# Patient Record
Sex: Male | Born: 1950
Health system: Southern US, Community
[De-identification: ages and names within clinical notes are randomized; demographics above are authoritative.]

## PROBLEM LIST (undated history)

## (undated) DIAGNOSIS — K219 Gastro-esophageal reflux disease without esophagitis: Secondary | ICD-10-CM

## (undated) DIAGNOSIS — M1990 Unspecified osteoarthritis, unspecified site: Secondary | ICD-10-CM

## (undated) DIAGNOSIS — Z8719 Personal history of other diseases of the digestive system: Secondary | ICD-10-CM

## (undated) DIAGNOSIS — M180 Bilateral primary osteoarthritis of first carpometacarpal joints: Secondary | ICD-10-CM

## (undated) DIAGNOSIS — G709 Myoneural disorder, unspecified: Secondary | ICD-10-CM

## (undated) DIAGNOSIS — H01009 Unspecified blepharitis unspecified eye, unspecified eyelid: Secondary | ICD-10-CM

## (undated) DIAGNOSIS — I10 Essential (primary) hypertension: Secondary | ICD-10-CM

## (undated) DIAGNOSIS — E042 Nontoxic multinodular goiter: Secondary | ICD-10-CM

## (undated) DIAGNOSIS — T7840XA Allergy, unspecified, initial encounter: Secondary | ICD-10-CM

## (undated) DIAGNOSIS — E119 Type 2 diabetes mellitus without complications: Secondary | ICD-10-CM

## (undated) DIAGNOSIS — J449 Chronic obstructive pulmonary disease, unspecified: Secondary | ICD-10-CM

## (undated) DIAGNOSIS — Z9109 Other allergy status, other than to drugs and biological substances: Secondary | ICD-10-CM

## (undated) DIAGNOSIS — H269 Unspecified cataract: Secondary | ICD-10-CM

## (undated) DIAGNOSIS — R7309 Other abnormal glucose: Secondary | ICD-10-CM

## (undated) DIAGNOSIS — R0902 Hypoxemia: Secondary | ICD-10-CM

## (undated) DIAGNOSIS — E785 Hyperlipidemia, unspecified: Secondary | ICD-10-CM

## (undated) DIAGNOSIS — C801 Malignant (primary) neoplasm, unspecified: Secondary | ICD-10-CM

## (undated) DIAGNOSIS — D2330 Other benign neoplasm of skin of unspecified part of face: Secondary | ICD-10-CM

## (undated) DIAGNOSIS — F419 Anxiety disorder, unspecified: Secondary | ICD-10-CM

## (undated) DIAGNOSIS — N400 Enlarged prostate without lower urinary tract symptoms: Secondary | ICD-10-CM

## (undated) DIAGNOSIS — J392 Other diseases of pharynx: Secondary | ICD-10-CM

## (undated) HISTORY — PX: EYE SURGERY: SHX253

## (undated) HISTORY — DX: Unspecified cataract: H26.9

## (undated) HISTORY — DX: Essential (primary) hypertension: I10

## (undated) HISTORY — DX: Anxiety disorder, unspecified: F41.9

## (undated) HISTORY — DX: Bilateral primary osteoarthritis of first carpometacarpal joints: M18.0

## (undated) HISTORY — PX: CATARACT EXTRACTION: SUR2

## (undated) HISTORY — DX: Hyperlipidemia, unspecified: E78.5

## (undated) HISTORY — DX: Hypoxemia: R09.02

## (undated) HISTORY — DX: Type 2 diabetes mellitus without complications: E11.9

## (undated) HISTORY — DX: Allergy, unspecified, initial encounter: T78.40XA

## (undated) HISTORY — DX: Malignant (primary) neoplasm, unspecified: C80.1

## (undated) HISTORY — DX: Unspecified blepharitis unspecified eye, unspecified eyelid: H01.009

## (undated) HISTORY — DX: Myoneural disorder, unspecified: G70.9

## (undated) HISTORY — PX: BIOPSY THYROID: PRO38

## (undated) HISTORY — DX: Other benign neoplasm of skin of unspecified part of face: D23.30

## (undated) HISTORY — PX: COLONOSCOPY: SHX174

## (undated) HISTORY — PX: NO PAST SURGERIES: SHX2092

## (undated) HISTORY — DX: Chronic obstructive pulmonary disease, unspecified: J44.9

## (undated) HISTORY — DX: Other abnormal glucose: R73.09

## (undated) HISTORY — DX: Benign prostatic hyperplasia without lower urinary tract symptoms: N40.0

---

## 2005-05-15 ENCOUNTER — Ambulatory Visit: Payer: Self-pay | Admitting: Family Medicine

## 2005-05-18 ENCOUNTER — Ambulatory Visit: Payer: Self-pay | Admitting: Internal Medicine

## 2005-06-30 ENCOUNTER — Ambulatory Visit: Payer: Self-pay | Admitting: Family Medicine

## 2005-08-31 ENCOUNTER — Ambulatory Visit: Payer: Self-pay | Admitting: Family Medicine

## 2005-10-11 ENCOUNTER — Ambulatory Visit: Payer: Self-pay | Admitting: Family Medicine

## 2005-11-17 ENCOUNTER — Ambulatory Visit: Payer: Self-pay | Admitting: Family Medicine

## 2006-07-13 ENCOUNTER — Ambulatory Visit: Payer: Self-pay | Admitting: Family Medicine

## 2006-07-13 LAB — CONVERTED CEMR LAB
ALT: 25 units/L (ref 0–40)
AST: 23 units/L (ref 0–37)
Basophils Relative: 0.8 % (ref 0.0–1.0)
Calcium: 9.8 mg/dL (ref 8.4–10.5)
Chloride: 108 meq/L (ref 96–112)
Cholesterol: 194 mg/dL (ref 0–200)
Creatinine, Ser: 0.9 mg/dL (ref 0.4–1.5)
Eosinophil percent: 2.1 % (ref 0.0–5.0)
Glucose, Bld: 113 mg/dL — ABNORMAL HIGH (ref 70–99)
HCT: 41 % (ref 39.0–52.0)
HDL: 32.1 mg/dL — ABNORMAL LOW (ref >39.0)
LDL Cholesterol: 132 mg/dL — ABNORMAL HIGH (ref 0–99)
Lymphocytes Relative: 38.7 % (ref 12.0–46.0)
MCHC: 34.3 g/dL (ref 30.0–36.0)
MCV: 93 fL (ref 78.0–100.0)
Monocytes Absolute: 0.6 10*3/uL (ref 0.2–0.7)
Neutro Abs: 2.4 10*3/uL (ref 1.4–7.7)
TSH: 1.03 microintl units/mL (ref 0.35–5.50)
Triglyceride fasting, serum: 150 mg/dL — ABNORMAL HIGH (ref 0–149)
VLDL: 30 mg/dL (ref 0–40)
WBC: 5 10*3/uL (ref 4.5–10.5)

## 2006-08-13 ENCOUNTER — Ambulatory Visit: Payer: Self-pay | Admitting: Family Medicine

## 2007-01-29 ENCOUNTER — Ambulatory Visit: Payer: Self-pay | Admitting: Family Medicine

## 2007-01-30 ENCOUNTER — Ambulatory Visit: Payer: Self-pay | Admitting: Family Medicine

## 2007-02-01 ENCOUNTER — Ambulatory Visit: Payer: Self-pay | Admitting: Family Medicine

## 2007-02-04 ENCOUNTER — Ambulatory Visit: Payer: Self-pay | Admitting: Family Medicine

## 2007-02-04 LAB — CONVERTED CEMR LAB
Glucose, Bld: 102 mg/dL — ABNORMAL HIGH (ref 70–99)
Hgb A1c MFr Bld: 6.7 % — ABNORMAL HIGH (ref 4.6–6.0)

## 2007-02-06 ENCOUNTER — Encounter: Admission: RE | Admit: 2007-02-06 | Discharge: 2007-03-12 | Payer: Self-pay | Admitting: Family Medicine

## 2007-03-18 ENCOUNTER — Ambulatory Visit: Payer: Self-pay | Admitting: Family Medicine

## 2007-03-18 DIAGNOSIS — M542 Cervicalgia: Secondary | ICD-10-CM | POA: Insufficient documentation

## 2007-03-18 DIAGNOSIS — R7309 Other abnormal glucose: Secondary | ICD-10-CM

## 2007-03-18 DIAGNOSIS — I10 Essential (primary) hypertension: Secondary | ICD-10-CM

## 2007-03-18 HISTORY — DX: Other abnormal glucose: R73.09

## 2007-03-18 HISTORY — DX: Essential (primary) hypertension: I10

## 2007-06-07 ENCOUNTER — Telehealth: Payer: Self-pay | Admitting: Family Medicine

## 2007-08-05 ENCOUNTER — Ambulatory Visit: Payer: Self-pay | Admitting: Family Medicine

## 2007-08-05 LAB — CONVERTED CEMR LAB
AST: 25 units/L (ref 0–37)
Albumin: 4 g/dL (ref 3.5–5.2)
Alkaline Phosphatase: 54 units/L (ref 39–117)
BUN: 14 mg/dL (ref 6–23)
Basophils Absolute: 0 10*3/uL (ref 0.0–0.1)
Basophils Relative: 0.8 % (ref 0.0–1.0)
Bilirubin Urine: NEGATIVE
CO2: 28 meq/L (ref 19–32)
Chloride: 104 meq/L (ref 96–112)
Creatinine, Ser: 0.8 mg/dL (ref 0.4–1.5)
Glucose, Urine, Semiquant: NEGATIVE
HCT: 41.3 % (ref 39.0–52.0)
Hemoglobin: 14.8 g/dL (ref 13.0–17.0)
Ketones, urine, test strip: NEGATIVE
Monocytes Absolute: 0.7 10*3/uL (ref 0.2–0.7)
Monocytes Relative: 12 % — ABNORMAL HIGH (ref 3.0–11.0)
Neutrophils Relative %: 49.9 % (ref 43.0–77.0)
PSA: 0.39 ng/mL (ref 0.10–4.00)
Potassium: 5.2 meq/L — ABNORMAL HIGH (ref 3.5–5.1)
RBC: 4.49 M/uL (ref 4.22–5.81)
RDW: 12 % (ref 11.5–14.6)
Total Bilirubin: 0.8 mg/dL (ref 0.3–1.2)
Total CHOL/HDL Ratio: 8.8
Total Protein: 6.8 g/dL (ref 6.0–8.3)
Triglycerides: 501 mg/dL (ref 0–149)
VLDL: 100 mg/dL — ABNORMAL HIGH (ref 0–40)

## 2007-08-09 ENCOUNTER — Telehealth: Payer: Self-pay | Admitting: Family Medicine

## 2007-08-09 ENCOUNTER — Ambulatory Visit: Payer: Self-pay | Admitting: Family Medicine

## 2007-08-09 DIAGNOSIS — R319 Hematuria, unspecified: Secondary | ICD-10-CM

## 2007-08-12 ENCOUNTER — Telehealth: Payer: Self-pay | Admitting: Family Medicine

## 2007-10-22 ENCOUNTER — Ambulatory Visit: Payer: Self-pay | Admitting: Family Medicine

## 2007-10-22 DIAGNOSIS — E785 Hyperlipidemia, unspecified: Secondary | ICD-10-CM

## 2007-10-22 HISTORY — DX: Hyperlipidemia, unspecified: E78.5

## 2007-10-22 LAB — CONVERTED CEMR LAB
Bilirubin Urine: NEGATIVE
Blood in Urine, dipstick: NEGATIVE
Cholesterol: 220 mg/dL (ref 0–200)
Direct LDL: 134.4 mg/dL
Glucose, Urine, Semiquant: NEGATIVE
HDL: 32.6 mg/dL — ABNORMAL LOW (ref >39.0)
Ketones, urine, test strip: NEGATIVE
Protein, U semiquant: NEGATIVE
Total CHOL/HDL Ratio: 6.7
Triglycerides: 270 mg/dL (ref 0–149)
Urobilinogen, UA: 0.2
VLDL: 54 mg/dL — ABNORMAL HIGH (ref 0–40)

## 2007-10-23 ENCOUNTER — Encounter: Payer: Self-pay | Admitting: Family Medicine

## 2007-10-24 ENCOUNTER — Ambulatory Visit: Payer: Self-pay | Admitting: Family Medicine

## 2007-10-29 ENCOUNTER — Encounter: Payer: Self-pay | Admitting: Family Medicine

## 2007-11-04 ENCOUNTER — Ambulatory Visit: Payer: Self-pay | Admitting: Family Medicine

## 2007-11-04 DIAGNOSIS — J069 Acute upper respiratory infection, unspecified: Secondary | ICD-10-CM | POA: Insufficient documentation

## 2008-02-04 ENCOUNTER — Telehealth: Payer: Self-pay | Admitting: Family Medicine

## 2008-02-14 ENCOUNTER — Ambulatory Visit: Payer: Self-pay | Admitting: Gastroenterology

## 2008-02-18 ENCOUNTER — Telehealth (INDEPENDENT_AMBULATORY_CARE_PROVIDER_SITE_OTHER): Payer: Self-pay | Admitting: *Deleted

## 2008-02-19 ENCOUNTER — Ambulatory Visit: Payer: Self-pay | Admitting: Gastroenterology

## 2008-02-19 ENCOUNTER — Encounter: Payer: Self-pay | Admitting: Gastroenterology

## 2008-02-24 ENCOUNTER — Encounter: Payer: Self-pay | Admitting: Gastroenterology

## 2008-05-07 ENCOUNTER — Telehealth: Payer: Self-pay | Admitting: Family Medicine

## 2008-05-09 ENCOUNTER — Ambulatory Visit: Payer: Self-pay | Admitting: Family Medicine

## 2008-06-26 ENCOUNTER — Telehealth: Payer: Self-pay | Admitting: Family Medicine

## 2008-07-28 ENCOUNTER — Ambulatory Visit: Payer: Self-pay | Admitting: Family Medicine

## 2008-08-03 ENCOUNTER — Telehealth: Payer: Self-pay | Admitting: Family Medicine

## 2008-09-21 ENCOUNTER — Telehealth: Payer: Self-pay | Admitting: Family Medicine

## 2008-11-11 ENCOUNTER — Ambulatory Visit: Payer: Self-pay | Admitting: Family Medicine

## 2008-11-11 LAB — CONVERTED CEMR LAB
Bilirubin Urine: NEGATIVE
Blood in Urine, dipstick: NEGATIVE
CO2: 26 meq/L (ref 19–32)
Chloride: 109 meq/L (ref 96–112)
Glucose, Urine, Semiquant: NEGATIVE
Potassium: 5 meq/L (ref 3.5–5.1)
Protein, U semiquant: NEGATIVE
Sodium: 140 meq/L (ref 135–145)
Urobilinogen, UA: 0.2
pH: 5

## 2009-03-02 ENCOUNTER — Telehealth: Payer: Self-pay | Admitting: Family Medicine

## 2009-05-10 ENCOUNTER — Ambulatory Visit: Payer: Self-pay | Admitting: Family Medicine

## 2009-09-20 ENCOUNTER — Ambulatory Visit: Payer: Self-pay | Admitting: Family Medicine

## 2009-09-20 LAB — CONVERTED CEMR LAB
ALT: 42 units/L (ref 0–53)
BUN: 9 mg/dL (ref 6–23)
Basophils Relative: 0.6 % (ref 0.0–3.0)
Bilirubin Urine: NEGATIVE
Bilirubin, Direct: 0.1 mg/dL (ref 0.0–0.3)
CO2: 28 meq/L (ref 19–32)
Chloride: 111 meq/L (ref 96–112)
Cholesterol: 214 mg/dL — ABNORMAL HIGH (ref 0–200)
Creatinine, Ser: 0.7 mg/dL (ref 0.4–1.5)
Direct LDL: 128.1 mg/dL
Eosinophils Absolute: 0.1 10*3/uL (ref 0.0–0.7)
Eosinophils Relative: 1.7 % (ref 0.0–5.0)
Glucose, Urine, Semiquant: NEGATIVE
HCT: 41.3 % (ref 39.0–52.0)
Ketones, urine, test strip: NEGATIVE
Lymphs Abs: 2 10*3/uL (ref 0.7–4.0)
MCHC: 34.1 g/dL (ref 30.0–36.0)
MCV: 94.8 fL (ref 78.0–100.0)
Monocytes Absolute: 0.6 10*3/uL (ref 0.1–1.0)
Neutrophils Relative %: 53.8 % (ref 43.0–77.0)
PSA: 0.67 ng/mL (ref 0.10–4.00)
Platelets: 244 10*3/uL (ref 150.0–400.0)
Potassium: 4.4 meq/L (ref 3.5–5.1)
Protein, U semiquant: NEGATIVE
TSH: 1.11 microintl units/mL (ref 0.35–5.50)
Total Bilirubin: 0.6 mg/dL (ref 0.3–1.2)
Total Protein: 7.2 g/dL (ref 6.0–8.3)
WBC: 6 10*3/uL (ref 4.5–10.5)
pH: 5

## 2009-09-27 ENCOUNTER — Ambulatory Visit: Payer: Self-pay | Admitting: Family Medicine

## 2009-10-05 ENCOUNTER — Ambulatory Visit: Payer: Self-pay | Admitting: Family Medicine

## 2009-10-07 DIAGNOSIS — D2330 Other benign neoplasm of skin of unspecified part of face: Secondary | ICD-10-CM

## 2009-10-07 HISTORY — DX: Other benign neoplasm of skin of unspecified part of face: D23.30

## 2009-10-29 ENCOUNTER — Ambulatory Visit: Payer: Self-pay | Admitting: Internal Medicine

## 2009-10-29 ENCOUNTER — Telehealth: Payer: Self-pay

## 2009-10-29 DIAGNOSIS — H01009 Unspecified blepharitis unspecified eye, unspecified eyelid: Secondary | ICD-10-CM

## 2009-10-29 HISTORY — DX: Unspecified blepharitis unspecified eye, unspecified eyelid: H01.009

## 2009-11-01 ENCOUNTER — Telehealth: Payer: Self-pay | Admitting: Family Medicine

## 2009-11-18 ENCOUNTER — Ambulatory Visit: Payer: Self-pay | Admitting: Family Medicine

## 2010-01-04 ENCOUNTER — Telehealth: Payer: Self-pay | Admitting: Family Medicine

## 2010-02-14 ENCOUNTER — Telehealth: Payer: Self-pay | Admitting: Family Medicine

## 2010-04-18 ENCOUNTER — Ambulatory Visit: Payer: Self-pay | Admitting: Family Medicine

## 2010-04-18 DIAGNOSIS — E119 Type 2 diabetes mellitus without complications: Secondary | ICD-10-CM

## 2010-04-18 HISTORY — DX: Type 2 diabetes mellitus without complications: E11.9

## 2010-04-18 LAB — CONVERTED CEMR LAB
AST: 28 units/L (ref 0–37)
Albumin: 4 g/dL (ref 3.5–5.2)
BUN: 15 mg/dL (ref 6–23)
Basophils Absolute: 0.1 10*3/uL (ref 0.0–0.1)
CO2: 26 meq/L (ref 19–32)
Chloride: 101 meq/L (ref 96–112)
Creatinine,U: 101.3 mg/dL
Eosinophils Absolute: 0.1 10*3/uL (ref 0.0–0.7)
Glucose, Bld: 235 mg/dL — ABNORMAL HIGH (ref 70–99)
HCT: 41.3 % (ref 39.0–52.0)
Hemoglobin: 14.4 g/dL (ref 13.0–17.0)
Hgb A1c MFr Bld: 13.2 % — ABNORMAL HIGH (ref 4.6–6.5)
Lymphs Abs: 1.6 10*3/uL (ref 0.7–4.0)
MCHC: 35 g/dL (ref 30.0–36.0)
MCV: 92.7 fL (ref 78.0–100.0)
Monocytes Absolute: 0.5 10*3/uL (ref 0.1–1.0)
Monocytes Relative: 9.3 % (ref 3.0–12.0)
Neutro Abs: 2.9 10*3/uL (ref 1.4–7.7)
Platelets: 245 10*3/uL (ref 150.0–400.0)
Potassium: 4.2 meq/L (ref 3.5–5.1)
RDW: 12.7 % (ref 11.5–14.6)
Sodium: 136 meq/L (ref 135–145)
TSH: 0.82 microintl units/mL (ref 0.35–5.50)
Total Bilirubin: 1 mg/dL (ref 0.3–1.2)

## 2010-04-25 ENCOUNTER — Ambulatory Visit: Payer: Self-pay | Admitting: Family Medicine

## 2010-05-09 ENCOUNTER — Ambulatory Visit: Payer: Self-pay | Admitting: Family Medicine

## 2010-06-09 ENCOUNTER — Ambulatory Visit: Payer: Self-pay | Admitting: Family Medicine

## 2010-09-04 LAB — CONVERTED CEMR LAB
ALT: 35 units/L (ref 0–53)
AST: 23 units/L (ref 0–37)
Alkaline Phosphatase: 60 units/L (ref 39–117)
BUN: 12 mg/dL (ref 6–23)
Basophils Relative: 0.6 % (ref 0.0–3.0)
CO2: 28 meq/L (ref 19–32)
Chloride: 107 meq/L (ref 96–112)
Eosinophils Absolute: 0.3 10*3/uL (ref 0.0–0.7)
Eosinophils Relative: 3.9 % (ref 0.0–5.0)
GFR calc non Af Amer: 92 mL/min
HDL: 35 mg/dL — ABNORMAL LOW (ref >39.0)
LDL Cholesterol: 113 mg/dL — ABNORMAL HIGH (ref 0–99)
Lymphocytes Relative: 18 % (ref 12.0–46.0)
MCV: 92.7 fL (ref 78.0–100.0)
Neutrophils Relative %: 66.2 % (ref 43.0–77.0)
Platelets: 287 10*3/uL (ref 150–400)
Potassium: 4.4 meq/L (ref 3.5–5.1)
RBC: 4.79 M/uL (ref 4.22–5.81)
Total Bilirubin: 0.7 mg/dL (ref 0.3–1.2)
Total CHOL/HDL Ratio: 5.3
VLDL: 38 mg/dL (ref 0–40)
WBC: 8.3 10*3/uL (ref 4.5–10.5)

## 2010-09-08 NOTE — Assessment & Plan Note (Signed)
Summary: 2 wk rov/njr   Vital Signs:  Patient profile:   60 year old male Weight:      238 pounds Temp:     98.1 degrees F oral BP sitting:   130 / 78  (left arm) Cuff size:   regular  Vitals Entered By: Kern Reap CMA Duncan Dull) (May 09, 2010 11:32 AM) CC: follow-up visit Is Patient Diabetic? Yes Did you bring your meter with you today? No Pain Assessment Patient in pain? no        CC:  follow-up visit.  History of Present Illness: Robert Cordova is a 60 year old male, type II diabetic, who comes in today for follow-up of diabetes.  Two weeks ago we increased his metformin to 1000 mg b.i.d. now.  Blood sugars are down to 100.  No hypoglycemia  Diabetes Management History:      He says that he is exercising.    Allergies: 1)  ! Ace Inhibitors  Past History:  Past medical, surgical, family and social histories (including risk factors) reviewed for relevance to current acute and chronic problems.  Past Medical History: Reviewed history from 07/28/2008 and no changes required. Hypertension fractured right fibula, 2009  Past Surgical History: Reviewed history from 07/28/2008 and no changes required. Denies surgical history  Family History: Reviewed history from 04/18/2010 and no changes required. Family History High cholesterol Family History Hypertension Fam hx MI Family History Diabetes 1st degree relative  Social History: Reviewed history from 08/09/2007 and no changes required. 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  Take 1 tablet by mouth two times a day His updated medication list for this problem includes:    Bayer Aspirin 325 Mg Tabs (Aspirin) ..... Once daily    Cozaar 100 Mg Tabs (Losartan potassium) .[name]... 1 & 1/2 qam    Metformin Hcl 1000 Mg Tabs (Metformin hcl) .[name]... Take 1 tablet by mouth two times a day  Orders: Prescription Created Electronically [phone])  Complete Medication List: 1)  Bayer Aspirin 325 Mg Tabs (Aspirin) .... Once daily 2)  Rogaine Extra Strength For Men 5 % Soln (Minoxidil) .... Once daily 3)  Cozaar 100 Mg Tabs (Losartan potassium) .[name].. 1 & 1/2 qam 4)  Bacitra-neomycin-polymyxin-hc 1 % Oint (Bacitracin-polymyx-neo-hc) .... Apply 4 times daily 5)  Zegerid Otc 20-1100 Mg Caps (Omeprazole-sodium bicarbonate) 6)  Metformin Hcl 1000 Mg Tabs (Metformin hcl) .... Take 1 tablet by mouth two times a day  Patient Instructions: 1)  See your eye doctor yearly to check for diabetic eye damage 2)  continue the metformin 1000 mg twice daily.  Check a fasting blood sugar daily.  Return in 4 weeks for follow-up Prescriptions: METFORMIN HCL 1000 MG TABS (METFORMIN HCL) Take 1 tablet by mouth two times a day  #200 x 3   Entered and Authorized by:   Roderick Pee MD   Signed by:   Roderick Pee MD on 05/09/2010   Method used:   [facility]        [facility] [address] [phone]* (retail)       [address]  [place], [place]  [address]       Ph: [phone] or [phone]       Fax: [phone]   RxID:   [id number]

## 2010-09-08 NOTE — Assessment & Plan Note (Signed)
Summary: 4 week follow up/cjr/pt rsc/cjr   Vital Signs:  Patient profile:   60 year old male Weight:      234 pounds Temp:     98.4 degrees F oral BP sitting:   130 / 80  (left arm) Cuff size:   regular  Vitals Entered By: Kern Reap CMA Duncan Dull) (June 09, 2010 11:43 AM) CC: follow-up visit   CC:  follow-up visit.  History of Present Illness: Robert Cordova is a 60 year old, married male, nonsmoker, who comes in today for follow-up of diabetes.  On metformin 1000 mg b.i.d. his blood sugars dropped to normal 90 to 110.  No hypoglycemia.  BP on Cozaar 150 daily, normal 130/80  Allergies: 1)  ! Ace Inhibitors  Past History:  Past medical, surgical, family and social histories (including risk factors) reviewed for relevance to current acute and chronic problems.  Past Medical History: Reviewed history from 07/28/2008 and no changes required. Hypertension fractured right fibula, 2009  Past Surgical History: Reviewed history from 07/28/2008 and no changes required. Denies surgical history  Family History: Reviewed history from 04/18/2010 and no changes required. Family History High cholesterol Family History Hypertension Fam hx MI Family History Diabetes 1st degree relative  Social History: Reviewed history from 08/09/2007 and no changes required. 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 Once daily    Cozaar 100 Mg Tabs (Losartan potassium) .[name]... 1 & 1/2 qam    Metformin Hcl 1000 Mg Tabs (Metformin hcl) .[name]... Take 1 tablet by mouth two times a  day  Complete Medication List: 1)  Bayer Aspirin 325 Mg Tabs (Aspirin) .... Once daily 2)  Rogaine Extra Strength For Men 5 % Soln (Minoxidil) .... Once daily 3)  Cozaar 100 Mg Tabs (Losartan potassium) .[name].. 1 & 1/2 qam 4)  Bacitra-neomycin-polymyxin-hc 1 % Oint (Bacitracin-polymyx-neo-hc) .... Apply 4 times daily 5)  Zegerid Otc 20-1100 Mg Caps (Omeprazole-sodium bicarbonate) 6)  Metformin Hcl 1000 Mg Tabs (Metformin hcl) .... Take 1 tablet by mouth two times a day  Patient Instructions: 1)  continue current medications 2)  Remember the importance of walking 30 minutes daily 3)  Please schedule a follow-up appointment in 3 months.///250.00 4)  BMP prior to visit, ICD-9: 5)  HbgA1C prior to visit, ICD-9:   Orders Added: 1)  Est. Patient Level III [42595]

## 2010-09-08 NOTE — Assessment & Plan Note (Signed)
Summary: cpx//ccm   Vital Signs:  Patient profile:   60 year old male Height:      70.25 inches Weight:      248 pounds BMI:     35.46 Temp:     99.0 degrees F oral BP sitting:   130 / 90  (left arm) Cuff size:   regular  Vitals Entered By: Kern Reap CMA Duncan Dull) (September 27, 2009 10:40 AM)  Reason for Visit cpx  History of Present Illness: Robert Cordova is a 60 year old, married male, nonsmoker, who comes in today for evaluation of hypertension.  He is on 60 mg of Benicar daily.  BP at home 140 to 130 systolic, 80 diastolic.  Review of systems negative.  He gets routine eye care, dental care, colonoscopy, 2010, normal, tetanus, 2008, seasonal flu 2010  Allergies: 1)  ! Ace Inhibitors  Past History:  Past medical, surgical, family and social histories (including risk factors) reviewed, and no changes noted (except as noted below).  Past Medical History: Reviewed history from 07/28/2008 and no changes required. Hypertension fractured right fibula, 2009  Past Surgical History: Reviewed history from 07/28/2008 and no changes required. Denies surgical history  Family History: Reviewed history from 03/18/2007 and no changes required. Family History High cholesterol Family History Hypertension Fam hx MI  Social History: Reviewed history from 08/09/2007 and no changes required. 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 No apparent alopecia or balding. Eyes:  No corneal or conjunctival inflammation noted. EOMI. Perrla. Funduscopic exam benign, without hemorrhages, exudates or papilledema. Vision grossly normal. Ears:  External ear exam shows no significant lesions or deformities.   Otoscopic examination reveals clear canals, tympanic membranes are intact bilaterally without bulging, retraction, inflammation or discharge. Hearing is grossly normal bilaterally. Nose:  External nasal examination shows no deformity or inflammation. Nasal mucosa are pink and moist without lesions or exudates. Mouth:  Oral mucosa and oropharynx without lesions or exudates.  Teeth in good repair. Neck:  No deformities, masses, or tenderness noted. Chest Wall:  No deformities, masses, tenderness or gynecomastia noted. Breasts:  No masses or gynecomastia noted Lungs:  Normal respiratory effort, chest expands symmetrically. Lungs are clear to auscultation, no crackles or wheezes. Heart:  Normal rate and regular rhythm. S1 and S2 normal without gallop, murmur, click, rub or other extra sounds. Abdomen:  Bowel sounds positive,abdomen soft and non-tender without masses, organomegaly or hernias noted. Rectal:  No external abnormalities noted. Normal sphincter tone. No rectal masses or tenderness. Genitalia:  Testes bilaterally descended without nodularity, tenderness or masses. No scrotal masses or lesions. No penis lesions or urethral discharge. Prostate:  Prostate gland firm and smooth, no enlargement, nodularity, tenderness, mass, asymmetry or induration. Msk:  No deformity or scoliosis noted of thoracic or lumbar spine.   Pulses:  R and L carotid,radial,femoral,dorsalis pedis and posterior tibial pulses are full and equal bilaterally Extremities:  No clubbing, cyanosis, edema, or deformity noted with normal full range of motion of all joints.   Neurologic:  No cranial nerve deficits noted. Station and gait are normal. Plantar reflexes are down-going bilaterally. DTRs are symmetrical throughout. Sensory, motor and coordinative functions appear intact. Skin:  Intact without suspicious lesions or rashes Cervical Nodes:  No lymphadenopathy noted Axillary Nodes:  No palpable lymphadenopathy Inguinal  Nodes:   No significant adenopathy Psych:  Cognition and judgment appear intact. Alert and cooperative with normal attention span and concentration. No apparent delusions, illusions, hallucinations   Impression & Recommendations:  Problem # 1:  HYPERTENSION (ICD-401.9) Assessment Improved  The following medications were removed from the medication list:    Benicar 40 Mg Tabs (Olmesartan medoxomil) .[name]... Take 1 tablet by mouth once a day His updated medication list for this problem includes:    Cozaar 100 Mg Tabs (Losartan potassium) .[name]... Take 1 tablet by mouth every morning  Orders: Prescription Created Electronically [phone]) EKG w/ Interpretation (93000)  Problem # 2:  PHYSICAL EXAMINATION (ICD-V70.0) Assessment: Unchanged  Orders: Prescription Created Electronically [phone])  Complete Medication List: 1)  Bayer Aspirin 325 Mg Tabs (Aspirin) .... Once daily 2)  Rogaine Extra Strength For Men 5 % Soln (Minoxidil) .... Once daily 3)  Cozaar 100 Mg Tabs (Losartan potassium) .... Take 1 tablet by mouth every morning  Patient Instructions: 1)  Please schedule a follow-up appointment in 1 year. 2)  It is important that you exercise regularly at least 20 minutes 5 times a week. If you develop chest pain, have severe difficulty breathing, or feel very tired , stop exercising immediately and seek medical attention. 3)  Take an Aspirin every day. 4)  change y  blood pressure medication..... Cozaar 100 mg daily check y BP daily for 4 weeks to be sure your blood pressure is normal.......... systolic less than 135......... diastolic less than 85 Prescriptions: COZAAR 100 MG TABS (LOSARTAN POTASSIUM) Take 1 tablet by mouth every morning  #100 x 3   Entered and Authorized by:   Roderick Pee MD   Signed by:   Roderick Pee MD on 09/27/2009   Method used:   Electronically to        [facility] [address] [phone]* (retail)       [address]       [place], [place]  [address]        Ph: [phone] or [phone]       Fax: [phone]   RxID:   [id number]    Immunization History:  Tetanus/Td Immunization History:    Tetanus/Td:  historical (08/07/2006)

## 2010-09-08 NOTE — Progress Notes (Signed)
Summary: Elevated BP  Phone Note Call from Patient Call back at Home Phone [phone]   Reason for Call: Talk to Nurse Summary of Call: Patient changed blood pressure meds from Benecar to Losartan (60mg ) approx. 3 weeks ago.  Blood pressure seems to be elevating again.  Currently 150/80... Wants to know if he should increase dosage. Initial call taken by: Everrett Coombe,  November 01, 2009 2:15 PM  Follow-up for Phone Call        increase Cozaar to 100 mg daily, dispense 100 tablets, refills x 2.  Also BP check q. a.m. x 4 weeks to be sure.  Blood pressure is normal.  If not see me Follow-up by: Roderick Pee MD,  November 01, 2009 2:47 PM  Additional Follow-up for Phone Call Additional follow up Details #1::        patient is already taking cozaar 100? Additional Follow-up by: Kern Reap CMA Duncan Dull),  November 01, 2009 5:12 PM    Additional Follow-up for Phone Call Additional follow up Details #2::    take one and half tab once daily  Follow-up by: Kern Reap CMA Duncan Dull),  November 01, 2009 5:19 PM  Additional Follow-up for Phone Call Additional follow up Details #3:: Details for Additional Follow-up Action Taken: patient is aware appointment made Additional Follow-up by: Kern Reap CMA Duncan Dull),  November 01, 2009 5:22 PM

## 2010-09-08 NOTE — Assessment & Plan Note (Signed)
Summary: LESION REMOVAL/NJR   Vital Signs:  Patient profile:   60 year old male BP sitting:   146 / 84  (left arm) Cuff size:   regular  Vitals Entered By: Raechel Ache, RN (October 05, 2009 11:44 AM) A  Procedure Note Last Tetanus: Historical (08/07/2006)  Mole Biopsy/Removal: Indication: suspicious lesion  Procedure # 1: elliptical incision with 3 mm margin    Size (in cm): 0.8 x 0.8    Region: anterior    Location: maxillary-right    Instrument used: #15 blade    Anesthesia: 1% lidocaine w/epinephrine    Closure: caut  Cleaned and prepped with: alcohol Wound dressing: vaseline and bandaid  CC: Lesion removal from face.   CC:  Lesion removal from face.[name]  History of Present Illness: Robert Cordova is a 27 -year-old male, who comes in today for removal of an ulcerated lesion on his right face.    Allergies: 1)  ! Ace Inhibitors   Complete Medication List: 1)  Bayer Aspirin 325 Mg Tabs (Aspirin) .... Once daily 2)  Rogaine Extra Strength For Men 5 % Soln (Minoxidil) .... Once daily 3)  Cozaar 100 Mg Tabs (Losartan potassium) .... Take 1 tablet by mouth every morning  Other Orders: Excise lesion ([facility]) 0.6-1.0 cm  ([address])  Patient Instructions: 1)  remove the Band-Aid in the morning.  Clean twice daily with peroxide.  Apply ointment p.r.n.

## 2010-09-08 NOTE — Progress Notes (Signed)
Summary:  eye oint rx  Phone Note From Pharmacy Call back at [phone]   Caller: Patient--live call [facility]er: [facility] Summary of Call: need clarification on eye solution. pt is there. Initial call taken by: Warnell Forester,  October 29, 2009 1:14 PM  Follow-up for Phone Call        [facility] - they only have polymixinb  3.5gm tubes in stock . I explained that the doctor wants pt to apply 4x daily...will have to buy multiple tubes. I ask they to ask pt to check with alternate pharmacy and if can can obtain to call bach before 5pm today.  KIK Follow-up by: Duard Brady LPN,  October 29, 2009 1:36 PM

## 2010-09-08 NOTE — Progress Notes (Signed)
Summary: losartan  Phone Note Call from Patient Call back at Home Phone [phone]   Reason for Call: Talk to Nurse Summary of Call: Thought BP med losartan changed to 150mg  at [facility] few months ago.  Pharmacy [facility]  [address] no record of that.   Initial call taken by: Rudy Jew, RN,  Jan 04, 2010 9:16 AM    Prescriptions: COZAAR 100 MG TABS (LOSARTAN POTASSIUM) 1 & 1/2 qam  #150 x 3   Entered by:   Rudy Jew, RN   Authorized by:   Roderick Pee MD   Signed by:   Rudy Jew, RN on 01/04/2010   Method used:   Electronically to        [facility] [address] [phone]* (retail)       [address]       [place], [place]  [address]       Ph: [phone] or [phone]       Fax: [phone]   RxID:   [id number]

## 2010-09-08 NOTE — Assessment & Plan Note (Signed)
Summary: EYE RED, SWOLLEN, PAINFUL/NO OPTH/T PT/PS   Vital Signs:  Patient profile:   60 year old male Weight:      254 pounds Temp:     97.8 degrees F oral BP sitting:   120 / 76  (left arm) Cuff size:   large  Vitals Entered By: Duard Brady LPN (October 29, 2009 11:10 AM) CC: c/o (L) eye swelling and drainage , ??scratched on Wed. ?? Is Patient Diabetic? No   CC:  c/o (L) eye swelling and drainage  and ??scratched on Wed. ??.  History of Present Illness: 60 year old patient who has noted some irritation and redness involving his left upper lid.  He describes a foreign body sensation.  There is been no difficulty with visual acuity.  There have been no unusual activities that put him at risk of a foreign body.  No prior history of conjunctivitis or blepharitis  Preventive Screening-Counseling & Management  Alcohol-Tobacco     Smoking Status: never  Allergies: 1)  ! Ace Inhibitors  Past History:  Past Medical History: Reviewed history from 07/28/2008 and no changes required. Hypertension fractured right fibula, 2009  Physical Exam  General:  overweight-appearing.  normal blood pressure Eyes:  No corneal or conjunctival inflammation noted. EOMI. Perrla. Funduscopic exam benign, without hemorrhages, exudates or papilledema. Vision grossly normal. the left upper lid, especially medially, was red and slightly erythematous   Impression & Recommendations:  Problem # 1:  BLEPHARITIS, LEFT (ICD-373.00)  Problem # 2:  HYPERTENSION (ICD-401.9)  His updated medication list for this problem includes:    Cozaar 100 Mg Tabs (Losartan potassium) .[name]... Take 1 tablet by mouth every morning  Complete Medication List: 1)  Bayer Aspirin 325 Mg Tabs (Aspirin) .... Once daily 2)  Rogaine Extra Strength For Men 5 % Soln (Minoxidil) .... Once daily 3)  Cozaar 100 Mg Tabs (Losartan potassium) .... Take 1 tablet by mouth every morning 4)  Bacitra-neomycin-polymyxin-hc 1 % Oint  (Bacitracin-polymyx-neo-hc) .... Apply 4 times daily  Patient Instructions: 1)  bacitracin ophthalmic ointment-use 4 times daily 2)  on compresses to the eye 4 times daily 3)  gentleman massage of the upper lid 4 times daily 4)  may irrigate with saline eyedrops Prescriptions: BACITRA-NEOMYCIN-POLYMYXIN-HC 1 % OINT (BACITRACIN-POLYMYX-NEO-HC) apply 4 times daily  #15 gm x 1   Entered and Authorized by:   Gordy Savers  MD   Signed by:   Gordy Savers  MD on 10/29/2009   Method used:   Print then Give to Patient   RxID:   [id number] BACITRA-NEOMYCIN-POLYMYXIN-HC 1 % OINT (BACITRACIN-POLYMYX-NEO-HC) apply 4 times daily  #15 gm x 1   Entered and Authorized by:   Gordy Savers  MD   Signed by:   Gordy Savers  MD on 10/29/2009   Method used:   [facility] [address] [phone]* (retail)       [address]       [place], [place]  [address]       Ph: [phone] or [phone]       Fax: [phone]   RxID:   [id number]

## 2010-09-08 NOTE — Assessment & Plan Note (Signed)
Summary: 1 WEEK FUP//CCM   Vital Signs:  Patient profile:   60 year old male Weight:      242 pounds Temp:     98.1 degrees F oral BP sitting:   140 / 80  (left arm) Cuff size:   regular  Vitals Entered By: Kern Reap CMA Duncan Dull) (April 25, 2010 11:21 AM)  CC: follow-up visit Is Patient Diabetic? Yes Did you bring your meter with you today? No   CC:  follow-up visit.  History of Present Illness: Robert Cordova is a 10-year-old male, who comes in today for follow-up of new onset diabetes.  We started him on metformin 500 mg b.i.d.  Blood sugar was in the 230 to 250 range is now down to 181.  No side effects from medication.  Review of systems negative except for some blurred vision.  Reassured that this can take up to 3 months after his blood sugar has normalized for his vision.  The clear.  Recommend Dr. Vonna Kotyk for eye exam.  Baseline retinopathy check  Allergies: 1)  ! Ace Inhibitors  Past History:  Past medical, surgical, family and social histories (including risk factors) reviewed, and no changes noted (except as noted below).  Past Medical History: Reviewed history from 07/28/2008 and no changes required. Hypertension fractured right fibula, 2009  Past Surgical History: Reviewed history from 07/28/2008 and no changes required. Denies surgical history  Family History: Reviewed history from 04/18/2010 and no changes required. Family History High cholesterol Family History Hypertension Fam hx MI Family History Diabetes 1st degree relative  Social History: Reviewed history from 08/09/2007 and no changes required. 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 Once daily    Cozaar 100 Mg Tabs (Losartan potassium) .[name]... 1 & 1/2 qam    Metformin Hcl 500 Mg Tabs (Metformin hcl) .[name]... Take 1 tablet by mouth two times a day  Complete Medication List: 1)  Bayer Aspirin 325 Mg Tabs (Aspirin) .... Once daily 2)  Rogaine Extra Strength For Men 5 % Soln (Minoxidil) .... Once daily 3)  Cozaar 100 Mg Tabs (Losartan potassium) .[name].. 1 & 1/2 qam 4)  Bacitra-neomycin-polymyxin-hc 1 % Oint (Bacitracin-polymyx-neo-hc) .... Apply 4 times daily 5)  Zegerid Otc 20-1100 Mg Caps (Omeprazole-sodium bicarbonate) 6)  Metformin Hcl 500 Mg Tabs (Metformin hcl) .... Take 1 tablet by mouth two times a day  Patient Instructions: 1)  See your eye doctor yearly to check for diabetic eye damage. 2)  increase the metformin to two tabs twice daily.  Check a fasting blood sugar daily in the morning.  Return in two weeks for follow-up.  When you return bring a record of all your blood sugar readings.  Also accompany her son to his diabetic teaching classes.

## 2010-09-08 NOTE — Assessment & Plan Note (Signed)
Summary: not feeling well--? dm//ccm   Vital Signs:  Patient profile:   60 year old male Weight:      239 pounds Temp:     98.6 degrees F oral BP sitting:   130 / 84  (left arm) Cuff size:   regular  Vitals Entered By: Kern Reap CMA Duncan Dull) (April 18, 2010 1:09 PM) CC: elevated blood glucose   CC:  elevated blood glucose.  History of Present Illness: Robert Cordova is a 70-year-old, married male, nonsmoker, who comes in today with a two-month history of frequency  nocturia, weight loss of approximately 10 pounds, increased thirst.  His son is diabetic.  He checked his blood sugar at, home it's over 250.  Allergies: 1)  ! Ace Inhibitors  Past History:  Past medical, surgical, family and social histories (including risk factors) reviewed for relevance to current acute and chronic problems.  Past Medical History: Reviewed history from 07/28/2008 and no changes required. Hypertension fractured right fibula, 2009  Past Surgical History: Reviewed history from 07/28/2008 and no changes required. Denies surgical history  Family History: Reviewed history from 03/18/2007 and no changes required. Family History High cholesterol Family History Hypertension Fam hx MI Family History Diabetes 1st degree relative  Social History: Reviewed history from 08/09/2007 and no changes required. 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 no    Any prior history of allergic reactions to egg and/or gelatin? no    Do you have a sensitivity to the preservative Thimersol? no    Do you have a past history of Guillan-Barre Syndrome? no    Do you currently have an acute febrile illness? no    Have you ever had a severe reaction to latex? no    Vaccine information given and explained to patient? yes  Are you currently pregnant? no    Lot Number:[id number]   Exp Date:02/04/2011   Site Given  Left Deltoid IM   Physical Exam  General:  Well-developed,well-nourished,in no acute distress; alert,appropriate and cooperative throughout examination   Problems:  Medical Problems Added: 1)  Dx of Diabetes-type 2  (ICD-250.00) 2)  Dx of Family History Diabetes 1st Degree Relative  (ICD-V18.0)  Impression & Recommendations:  Problem # 1:  DIABETES-TYPE 2 (ICD-250.00) Assessment New  His updated medication list for this problem includes:    Bayer Aspirin 325 Mg Tabs (Aspirin) ..... Once daily    Cozaar 100 Mg Tabs (Losartan potassium) .[name]... 1 & 1/2 qam    Metformin Hcl 500 Mg Tabs (Metformin hcl) .[name]... Take 1 tablet by mouth two times a day  Orders: Prescription Created Electronically [phone]) Glucose, (CBG) [phone]) Venipuncture (14782) Prescription Created Electronically [phone]) Specimen Handling (30865) TLB-Lipid Panel (80061-LIPID) TLB-BMP (Basic Metabolic Panel-BMET) (80048-METABOL) TLB-CBC Platelet - w/Differential (85025-CBCD) TLB-Hepatic/Liver Function Pnl (80076-HEPATIC) TLB-TSH (Thyroid Stimulating Hormone) (84443-TSH) TLB-A1C / Hgb A1C (Glycohemoglobin) (83036-A1C) TLB-Microalbumin/Creat Ratio, Urine (82043-MALB)  Complete Medication List: 1)  Bayer Aspirin 325 Mg Tabs (Aspirin) .... Once daily 2)  Rogaine Extra Strength For Men 5 % Soln (Minoxidil) .... Once daily 3)  Cozaar 100 Mg Tabs (Losartan potassium) .[name].. 1 & 1/2 qam 4)  Bacitra-neomycin-polymyxin-hc 1 % Oint (Bacitracin-polymyx-neo-hc) .... Apply 4 times daily 5)  Zegerid Otc 20-1100 Mg Caps (Omeprazole-sodium bicarbonate) 6)  Metformin  Hcl 500 Mg Tabs (Metformin hcl) .... Take 1 tablet by mouth two times a day  Other Orders: Admin 1st Vaccine (16606) Flu Vaccine 29yrs + (30160)  Patient Instructions: 1)  avoid carbohydrates, drink, 30 ounces of water daily, begin metformin 500 mg now then 500 mg prior to  your evening meal tonight then starting tomorrow 500 mg before breakfast and 500 mg before your evening meal. 2)  Check a fasting blood sugar daily. 3)  Return in one week for follow-up Prescriptions: METFORMIN HCL 500 MG TABS (METFORMIN HCL) Take 1 tablet by mouth two times a day  #200 x 3   Entered and Authorized by:   Roderick Pee MD   Signed by:   Roderick Pee MD on 04/18/2010   Method used:   [facility]        [facility] [address] [phone]* (retail)       [address]       [place], [place]  [address]       Ph: [phone] or [phone]       Fax: [phone]   RxID:   [id number]

## 2010-09-08 NOTE — Progress Notes (Signed)
Summary: cramping question  Phone Note Call from Patient Call back at [phone]   Caller: vm Summary of Call: When working in yard, sweat a lot, drink lot of liquid, get cramping fingers,hands arms,forearms,toes, calves.  Wondering if deficient in something?   Initial call taken by: Rudy Jew, RN,  February 14, 2010 10:02 AM  Follow-up for Phone Call        recent is to show a normal potassium level.......[name] probably something like [email or web address]  would be a good supplement Follow-up by: Roderick Pee MD,  February 14, 2010 11:23 AM  Additional Follow-up for Phone Call Additional follow up Details #1::        Phone Call Completed Additional Follow-up by: Rudy Jew, RN,  February 14, 2010 11:45 AM

## 2010-09-08 NOTE — Assessment & Plan Note (Signed)
Summary: follow up HTN - rv   Vital Signs:  Patient profile:   60 year old male Weight:      249 pounds Temp:     97.9 degrees F oral BP sitting:   124 / 84 Cuff size:   large  Vitals Entered By: Kern Reap CMA Duncan Dull) (November 18, 2009 2:41 PM) CC: BP check   CC:  BP check.  History of Present Illness: Robert Cordova is a 60 -year-old male who comes in today for follow-up of hypertension since we made a medication change.  He increased his Cozaar to 150 mg daily because his blood pressure had gone up.  Now is 124/84 130/70.  No side effects from increasing dose  Allergies: 1)  ! Ace Inhibitors  Past History:  Past medical, surgical, family and social histories (including risk factors) reviewed for relevance to current acute and chronic problems.  Past Medical History: Reviewed history from 07/28/2008 and no changes required. Hypertension fractured right fibula, 2009  Past Surgical History: Reviewed history from 07/28/2008 and no changes required. Denies surgical history  Family History: Reviewed history from 03/18/2007 and no changes required. Family History High cholesterol Family History Hypertension Fam hx MI  Social History: Reviewed history from 08/09/2007 and no changes required. 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 1 & 1/2 qam  Complete Medication List: 1)  Bayer Aspirin 325 Mg Tabs (Aspirin) .... Once daily 2)  Rogaine Extra Strength For Men 5 % Soln (Minoxidil) .... Once daily 3)  Cozaar  100 Mg Tabs (Losartan potassium) .[name].. 1 & 1/2 qam 4)  Bacitra-neomycin-polymyxin-hc 1 % Oint (Bacitracin-polymyx-neo-hc) .... Apply 4 times daily 5)  Zegerid Otc 20-1100 Mg Caps (Omeprazole-sodium bicarbonate) 6)  Hydromet 5-1.5 Mg/44ml Syrp (Hydrocodone-homatropine) .[name].. 1 or 2 tsps qhsprn  Patient Instructions: 1)  continued the losartan 150 mg daily.  Check her blood pressure weekly to be sure it stays normal. Prescriptions: COZAAR 100 MG TABS (LOSARTAN POTASSIUM) 1 & 1/2 qam  #150 x 3   Entered and Authorized by:   Roderick Pee MD   Signed by:   Roderick Pee MD on 11/18/2009   Method used:   [facility]        [facility] [address] [phone]* (retail)       [address]       [place], [place]  [address]       Ph: [phone] or [phone]       Fax: [phone]   RxID:   [id number] HYDROMET 5-1.5 MG/5ML SYRP (HYDROCODONE-HOMATROPINE) 1 or 2 tsps qhsprn  #8oz x 1   Entered and Authorized by:   Roderick Pee MD   Signed by:   Roderick Pee MD on 11/18/2009   Method used:   Print then Give to Patient   RxID:   [id number]

## 2010-09-14 ENCOUNTER — Other Ambulatory Visit: Payer: Managed Care, Other (non HMO) | Admitting: Family Medicine

## 2010-09-14 DIAGNOSIS — E119 Type 2 diabetes mellitus without complications: Secondary | ICD-10-CM

## 2010-09-14 LAB — BASIC METABOLIC PANEL
BUN: 23 mg/dL (ref 6–23)
Creatinine, Ser: 0.8 mg/dL (ref 0.4–1.5)
GFR: 98.65 mL/min (ref >60.00)
Glucose, Bld: 110 mg/dL — ABNORMAL HIGH (ref 70–99)
Potassium: 4.5 mEq/L (ref 3.5–5.1)

## 2010-09-21 ENCOUNTER — Ambulatory Visit: Payer: Self-pay | Admitting: Family Medicine

## 2010-09-29 ENCOUNTER — Ambulatory Visit: Payer: Self-pay | Admitting: Family Medicine

## 2010-10-02 ENCOUNTER — Encounter: Payer: Self-pay | Admitting: Family Medicine

## 2010-10-03 ENCOUNTER — Ambulatory Visit (INDEPENDENT_AMBULATORY_CARE_PROVIDER_SITE_OTHER): Payer: Managed Care, Other (non HMO) | Admitting: Family Medicine

## 2010-10-03 ENCOUNTER — Encounter: Payer: Self-pay | Admitting: Family Medicine

## 2010-10-03 DIAGNOSIS — E119 Type 2 diabetes mellitus without complications: Secondary | ICD-10-CM

## 2010-10-03 DIAGNOSIS — I10 Essential (primary) hypertension: Secondary | ICD-10-CM

## 2010-10-03 NOTE — Progress Notes (Signed)
  Subjective:    Patient ID: Robert Cordova, male    DOB: May 19, 1951, 60 y.o.   MRN: [id number]  HPI Robert Cordova is a 60 year old man male, nonsmoker, who comes in today for follow-up of diabetes is currently on metformin 1000 mg b.i.d. Blood sugar 110.  Hemoglobin A1c6 .3%.  He was referred to Dr. Mia Creek for an eye exam.     Review of Systems    Negative Objective:   Physical Exam Well-developed well-nourished, male in no acute distress       Assessment & Plan:  Diabetes type 2, under good control.  Plan continue current therapy.  Follow-up in 3 months CPX

## 2010-10-03 NOTE — Patient Instructions (Signed)
Continue your current medications follow-up in 3 months with a complete physical exam

## 2010-12-27 ENCOUNTER — Other Ambulatory Visit: Payer: Managed Care, Other (non HMO)

## 2010-12-28 ENCOUNTER — Other Ambulatory Visit (INDEPENDENT_AMBULATORY_CARE_PROVIDER_SITE_OTHER): Payer: Managed Care, Other (non HMO)

## 2010-12-28 DIAGNOSIS — I10 Essential (primary) hypertension: Secondary | ICD-10-CM

## 2010-12-28 DIAGNOSIS — E119 Type 2 diabetes mellitus without complications: Secondary | ICD-10-CM

## 2010-12-28 LAB — CBC WITH DIFFERENTIAL/PLATELET
Basophils Absolute: 0 10*3/uL (ref 0.0–0.1)
Eosinophils Relative: 1.5 % (ref 0.0–5.0)
HCT: 39.2 % (ref 39.0–52.0)
Hemoglobin: 13.4 g/dL (ref 13.0–17.0)
Lymphocytes Relative: 33.8 % (ref 12.0–46.0)
Monocytes Relative: 9.9 % (ref 3.0–12.0)
Neutro Abs: 3.2 10*3/uL (ref 1.4–7.7)
RDW: 12.9 % (ref 11.5–14.6)
WBC: 5.8 10*3/uL (ref 4.5–10.5)

## 2010-12-28 LAB — POCT URINALYSIS DIPSTICK
Bilirubin, UA: NEGATIVE
Glucose, UA: NEGATIVE
Leukocytes, UA: NEGATIVE
Nitrite, UA: NEGATIVE
pH, UA: 5

## 2010-12-28 LAB — HEMOGLOBIN A1C: Hgb A1c MFr Bld: 6.6 % — ABNORMAL HIGH (ref 4.6–6.5)

## 2010-12-28 LAB — LIPID PANEL
LDL Cholesterol: 92 mg/dL (ref 0–99)
VLDL: 32.6 mg/dL (ref 0.0–40.0)

## 2010-12-28 LAB — BASIC METABOLIC PANEL
Calcium: 9.3 mg/dL (ref 8.4–10.5)
GFR: 94.64 mL/min (ref >60.00)
Glucose, Bld: 120 mg/dL — ABNORMAL HIGH (ref 70–99)
Potassium: 4.8 mEq/L (ref 3.5–5.1)
Sodium: 139 mEq/L (ref 135–145)

## 2010-12-28 LAB — HEPATIC FUNCTION PANEL
ALT: 27 U/L (ref 0–53)
AST: 21 U/L (ref 0–37)
Albumin: 3.9 g/dL (ref 3.5–5.2)
Alkaline Phosphatase: 40 U/L (ref 39–117)
Total Bilirubin: 0.6 mg/dL (ref 0.3–1.2)

## 2010-12-28 LAB — MICROALBUMIN / CREATININE URINE RATIO: Microalb, Ur: 1.8 mg/dL (ref 0.0–1.9)

## 2010-12-28 LAB — TSH: TSH: 0.86 u[IU]/mL (ref 0.35–5.50)

## 2011-01-03 ENCOUNTER — Encounter: Payer: Managed Care, Other (non HMO) | Admitting: Family Medicine

## 2011-01-30 ENCOUNTER — Ambulatory Visit (INDEPENDENT_AMBULATORY_CARE_PROVIDER_SITE_OTHER): Payer: Managed Care, Other (non HMO) | Admitting: Family Medicine

## 2011-01-30 ENCOUNTER — Encounter: Payer: Self-pay | Admitting: Family Medicine

## 2011-01-30 DIAGNOSIS — Z23 Encounter for immunization: Secondary | ICD-10-CM

## 2011-01-30 DIAGNOSIS — K219 Gastro-esophageal reflux disease without esophagitis: Secondary | ICD-10-CM

## 2011-01-30 DIAGNOSIS — E119 Type 2 diabetes mellitus without complications: Secondary | ICD-10-CM

## 2011-01-30 DIAGNOSIS — Z2911 Encounter for prophylactic immunotherapy for respiratory syncytial virus (RSV): Secondary | ICD-10-CM

## 2011-01-30 DIAGNOSIS — L659 Nonscarring hair loss, unspecified: Secondary | ICD-10-CM

## 2011-01-30 DIAGNOSIS — E785 Hyperlipidemia, unspecified: Secondary | ICD-10-CM

## 2011-01-30 DIAGNOSIS — I10 Essential (primary) hypertension: Secondary | ICD-10-CM

## 2011-01-30 MED ORDER — OMEPRAZOLE-SODIUM BICARBONATE 20-1100 MG PO CAPS: 1.0000 | ORAL_CAPSULE | Freq: Every day | ORAL | Status: DC

## 2011-01-30 MED ORDER — LOSARTAN POTASSIUM 100 MG PO TABS: 100.0000 mg | ORAL_TABLET | Freq: Every day | ORAL | Status: DC

## 2011-01-30 MED ORDER — METFORMIN HCL 1000 MG PO TABS: 1000.0000 mg | ORAL_TABLET | Freq: Two times a day (BID) | ORAL | Status: DC

## 2011-01-30 MED ORDER — MINOXIDIL 5 % EX SOLN: CUTANEOUS | Status: DC

## 2011-01-30 NOTE — Progress Notes (Signed)
  Subjective:    Patient ID: Robert Cordova, male    DOB: [birth year], 60 y.o.   MRN: [id number]  HPI   Robert Cordova is a 60 year old, married male, nonsmoker, who comes in today for physical examination because of a history of underlying hyperlipidemia and hypertension.  Hair loss, and reflux esophagitis.  His hypertension is treated with losartan 150 mg daily.  BP 140/80.  His diabetes is treated with metformin 1000 mg b.i.d. Blood sugar in the 120 range.  A1c6 .6%.  What his blood sugar was elevated.  His lipids were abnormal.  Since his blood sugar is normalized.  Lip is now brought back to normal.  He takes omeprazole, and bicarb 20 -- 1100 daily for chronic reflux esophagitis.  Referred to Dr. Vonna Kotyk for exam, routine dental care, colonoscopy, 2009 normal tetanus 2008, shingles.  Vaccination today    Review of Systems  Constitutional: Negative.   HENT: Negative.   Eyes: Negative.   Respiratory: Negative.   Cardiovascular: Negative.   Gastrointestinal: Negative.   Genitourinary: Negative.   Musculoskeletal: Negative.   Skin: Negative.   Neurological: Negative.   Hematological: Negative.   Psychiatric/Behavioral: Negative.        Objective:   Physical Exam  Constitutional: He is oriented to person, place, and time. He appears well-developed and well-nourished.  HENT:  Head: Normocephalic and atraumatic.  Right Ear: External ear normal.  Left Ear: External ear normal.  Nose: Nose normal.  Mouth/Throat: Oropharynx is clear and moist.  Eyes: Conjunctivae and EOM are normal. Pupils are equal, round, and reactive to light.  Neck: Normal range of motion. Neck supple. No JVD present. No tracheal deviation present. No thyromegaly present.  Cardiovascular: Normal rate, regular rhythm, normal heart sounds and intact distal pulses.  Exam reveals no gallop and no friction rub.   No murmur heard. Pulmonary/Chest: Effort normal and breath sounds normal. No stridor. No respiratory  distress. He has no wheezes. He has no rales. He exhibits no tenderness.  Abdominal: Soft. Bowel sounds are normal. He exhibits no distension and no mass. There is no tenderness. There is no rebound and no guarding.       Slight ventral hernia  Genitourinary: Rectum normal, prostate normal and penis normal. Guaiac negative stool. No penile tenderness.  Musculoskeletal: Normal range of motion. He exhibits no edema and no tenderness.  Lymphadenopathy:    He has no cervical adenopathy.  Neurological: He is alert and oriented to person, place, and time. He has normal reflexes. No cranial nerve deficit. He exhibits normal muscle tone.  Skin: Skin is warm and dry. No rash noted. No erythema. No pallor.  Psychiatric: He has a normal mood and affect. His behavior is normal. Judgment and thought content normal.          Assessment & Plan:  Hypertension continue Cozaar 150 daily, and an aspirin tablet.  Diabetes continue metformin 1000 b.i.d. Follow-up blood sugar in 6 months.  Chronic reflux.  Continue the omeprazole, bicarb combination.  Hair loss.  Continue Rogaine

## 2011-01-30 NOTE — Patient Instructions (Signed)
Continue your current medications.  Follow-up in 6 months with blood work one week prior

## 2011-03-30 ENCOUNTER — Encounter: Payer: Self-pay | Admitting: Gastroenterology

## 2011-05-26 ENCOUNTER — Other Ambulatory Visit: Payer: Self-pay | Admitting: Family Medicine

## 2011-07-13 ENCOUNTER — Other Ambulatory Visit (INDEPENDENT_AMBULATORY_CARE_PROVIDER_SITE_OTHER): Payer: Managed Care, Other (non HMO)

## 2011-07-13 DIAGNOSIS — E119 Type 2 diabetes mellitus without complications: Secondary | ICD-10-CM

## 2011-07-13 DIAGNOSIS — Z23 Encounter for immunization: Secondary | ICD-10-CM

## 2011-07-13 DIAGNOSIS — Z0000 Encounter for general adult medical examination without abnormal findings: Secondary | ICD-10-CM

## 2011-07-13 LAB — BASIC METABOLIC PANEL
CO2: 21 mEq/L (ref 19–32)
Chloride: 107 mEq/L (ref 96–112)
Glucose, Bld: 149 mg/dL — ABNORMAL HIGH (ref 70–99)
Potassium: 4 mEq/L (ref 3.5–5.1)
Sodium: 139 mEq/L (ref 135–145)

## 2011-07-13 NOTE — Progress Notes (Signed)
Addended by: Azucena Freed on: 07/13/2011 05:49 PM   Modules accepted: Orders

## 2011-07-17 ENCOUNTER — Other Ambulatory Visit: Payer: Managed Care, Other (non HMO)

## 2011-07-24 ENCOUNTER — Ambulatory Visit: Payer: Managed Care, Other (non HMO) | Admitting: Family Medicine

## 2011-07-26 ENCOUNTER — Encounter: Payer: Self-pay | Admitting: Family Medicine

## 2011-07-26 ENCOUNTER — Ambulatory Visit (INDEPENDENT_AMBULATORY_CARE_PROVIDER_SITE_OTHER): Payer: Managed Care, Other (non HMO) | Admitting: Family Medicine

## 2011-07-26 ENCOUNTER — Other Ambulatory Visit: Payer: Self-pay

## 2011-07-26 DIAGNOSIS — M722 Plantar fascial fibromatosis: Secondary | ICD-10-CM | POA: Insufficient documentation

## 2011-07-26 DIAGNOSIS — E1065 Type 1 diabetes mellitus with hyperglycemia: Secondary | ICD-10-CM

## 2011-07-26 DIAGNOSIS — M1990 Unspecified osteoarthritis, unspecified site: Secondary | ICD-10-CM

## 2011-07-26 MED ORDER — INSULIN LISPRO PROT & LISPRO (75-25 MIX) 100 UNIT/ML ~~LOC~~ SUSP: 10.0000 [IU] | Freq: Every day | SUBCUTANEOUS | Status: DC

## 2011-07-26 MED ORDER — INSULIN PEN NEEDLE 32G X 4 MM MISC: 1.0000 | Freq: Every day | Status: DC

## 2011-07-26 MED ORDER — "INSULIN SYRINGE-NEEDLE U-100 31G X 5/16"" 0.3 ML MISC": Status: DC

## 2011-07-26 NOTE — Patient Instructions (Signed)
Thursday night, begin 10 units of insulin at bedtime.  Continue your medications.  Check a fasting blood sugar daily in the morning.  Return in 3 weeks for follow-up, sooner if any problems.  Whenever to walk 20 minutes daily.  Motrin 400 mg twice daily with food for your joint pain.  If the joint pain persists or gets worse.  I would recommend Dr. Albertha Ghee, orthopedist

## 2011-07-26 NOTE — Telephone Encounter (Signed)
Pt called requesting Insulin Syringes.  Rx sent to pharmacy.

## 2011-07-26 NOTE — Progress Notes (Signed)
  Subjective:    Patient ID: Robert Cordova, male    DOB: Apr 14, 1951, 60 y.o.   MRN: [id number]  HPI [name] is a 60 year old, married male, nonsmoker, who comes in today for follow-up of diabetes.  In addition, he has some musculoskeletal complaints.  His blood sugar now has gone up in the 15160 range.  A1c7 .6%.  He taking metformin 1000 mg b.i.d..  We talked about various options.  We will begin low-dose insulin 10 units of 75 -- 25 nightly the first shot given in the office.  He is complaining of soreness in his left shoulder for couple months.  Right thumb for couple months, and right heel for couple weeks.  No history of trauma   Review of Systems    General metabolic orthopedic review of systems otherwise negative Objective:   Physical Exam  Well-developed well-nourished man in no acute distress.  Examination left shoulder shows full range of motion.  There is some creaking with range of motion, consistent with some arthritis.  Right thumb appears normal.  Right heel tender in the plantar fascia      Assessment & Plan:  Diabetes type II evolving into diabetes, type I begin insulin 10 units nightly  Arthritis, left shoulder.  Arthritis, right thumb.  Plantar fasciitis, right heel.  Plan exercise no more than 400 mg of Motrin b.i.d. With food.  Orthopedic consult p.r.n.  10 units of insulin nightly follow-up in 3 weeks

## 2011-07-27 ENCOUNTER — Telehealth: Payer: Self-pay | Admitting: Family Medicine

## 2011-07-27 NOTE — Telephone Encounter (Signed)
Patient is aware 

## 2011-07-27 NOTE — Telephone Encounter (Signed)
ok 

## 2011-07-27 NOTE — Telephone Encounter (Signed)
Patient would like to use the flex pen for insulin next time if possible.  Easier for patient to use because of travel

## 2011-07-27 NOTE — Telephone Encounter (Signed)
Pt is having issues with prescription and requesting you contact him

## 2011-08-14 ENCOUNTER — Ambulatory Visit (INDEPENDENT_AMBULATORY_CARE_PROVIDER_SITE_OTHER): Payer: Managed Care, Other (non HMO) | Admitting: Family Medicine

## 2011-08-14 ENCOUNTER — Encounter: Payer: Self-pay | Admitting: Family Medicine

## 2011-08-14 VITALS — BP 140/90 | Temp 98.8°F | Wt 238.0 lb

## 2011-08-14 DIAGNOSIS — E1065 Type 1 diabetes mellitus with hyperglycemia: Secondary | ICD-10-CM

## 2011-08-14 MED ORDER — INSULIN PEN NEEDLE 31G X 8 MM MISC: 1.0000 | Freq: Every day | Status: DC

## 2011-08-14 NOTE — Patient Instructions (Signed)
Increase your insulin to 15 units nightly at bedtime.  Check a fasting blood sugar daily in the morning.  The goal is to keep y blood sugars between 90 and 120  If after a week on 15 units a day, your blood sugars are still elevated........ Over 140....... Then increase the insulin to 20 units daily.  In other words, increased her insulin by 5 units weekly, to keep your blood sugar at goal.  Return in 3 weeks with a record of all your blood sugar readings

## 2011-08-14 NOTE — Progress Notes (Signed)
  Subjective:    Patient ID: Robert Cordova, male    DOB: 08-24-1950, 61 y.o.   MRN: [id number]  HPI Robert Cordova is a 61 year old male, previously type II diabetic, who we started on insulin two weeks ago because his blood sugars were not at goal.  He states now his blood sugars have dropped, but now has gone up.  He has begun an exercise program, which dropped his sugars 30 to 40 points.  Current insulin dose, 10 units daily at bedtime.  No hypoglycemia    Review of Systems    General and metabolic review of systems otherwise negative Objective:   Physical Exam Well-developed well-nourished, male in no acute distress       Assessment & Plan:  Diabetes type 1, not a call.  Plan increase insulin to 15 units nightly at bedtime.  Continue to monitor blood sugar follow-up in 3 weeks

## 2011-08-17 ENCOUNTER — Telehealth: Payer: Self-pay | Admitting: *Deleted

## 2011-08-17 NOTE — Telephone Encounter (Signed)
Pt is asking for letters to written to his insurance company re: he and his wife, and would like to discuss with Fleet Contras?

## 2011-08-17 NOTE — Telephone Encounter (Signed)
Pt states he needs a letter for his new insurance stating he is being treated for high blood pressure and diabetes.  Pt needs letter as soon as Monday.

## 2011-08-18 NOTE — Telephone Encounter (Signed)
rx ready for pickup 

## 2011-08-28 ENCOUNTER — Ambulatory Visit: Payer: Managed Care, Other (non HMO) | Admitting: Family Medicine

## 2011-09-04 ENCOUNTER — Ambulatory Visit (INDEPENDENT_AMBULATORY_CARE_PROVIDER_SITE_OTHER): Payer: Managed Care, Other (non HMO) | Admitting: Family Medicine

## 2011-09-04 ENCOUNTER — Encounter: Payer: Self-pay | Admitting: Family Medicine

## 2011-09-04 DIAGNOSIS — IMO0002 Reserved for concepts with insufficient information to code with codable children: Secondary | ICD-10-CM

## 2011-09-04 DIAGNOSIS — E1065 Type 1 diabetes mellitus with hyperglycemia: Secondary | ICD-10-CM

## 2011-09-05 NOTE — Patient Instructions (Signed)
Continue your current therapy  Set up for CPX sometime in the next 4 weeks  Followup A1c in 3 months

## 2011-09-05 NOTE — Progress Notes (Signed)
  Subjective:    Patient ID: Robert Cordova, male    DOB: [birth year], 61 y.o.   MRN: [id number]   HPI Robert Cordova is a 61 year old married male nonsmoker who comes in today for followup of diabetes type 1  He maxed out on oral meds and we started insulin 75-25 doses 10 units each bedtime.  We have slowly increased his insulin now he's on 15 units nightly at bedtime and his fasting blood sugar has dropped to the 120 range. No hypoglycemia   Review of Systems    general and metabolic review of systems otherwise negative Objective:   Physical Exam Well-developed well-nourished male in no acute distress       Assessment & Plan:  Diabetes type 1 and dull plan continue current therapy followup A1c in 3 months set up CPE X.

## 2011-09-12 ENCOUNTER — Telehealth: Payer: Self-pay | Admitting: Family Medicine

## 2011-09-12 NOTE — Telephone Encounter (Signed)
Needs new rx for Freestyle lite test strips. He stated that he tests bid. [facility]

## 2011-09-13 MED ORDER — GLUCOSE BLOOD VI STRP: 1.0000 | ORAL_STRIP | Freq: Two times a day (BID) | Status: DC

## 2012-01-09 ENCOUNTER — Telehealth: Payer: Self-pay | Admitting: Family Medicine

## 2012-01-09 DIAGNOSIS — E1065 Type 1 diabetes mellitus with hyperglycemia: Secondary | ICD-10-CM

## 2012-01-09 DIAGNOSIS — I10 Essential (primary) hypertension: Secondary | ICD-10-CM

## 2012-01-09 DIAGNOSIS — E785 Hyperlipidemia, unspecified: Secondary | ICD-10-CM

## 2012-01-09 DIAGNOSIS — Z0000 Encounter for general adult medical examination without abnormal findings: Secondary | ICD-10-CM

## 2012-01-09 NOTE — Telephone Encounter (Signed)
Labs ordered.

## 2012-01-09 NOTE — Telephone Encounter (Signed)
This pt is going to [facility] lab on 6/19 for CPX labs. Can you please put the orders in? Thanks.  

## 2012-01-22 ENCOUNTER — Other Ambulatory Visit (INDEPENDENT_AMBULATORY_CARE_PROVIDER_SITE_OTHER): Payer: PRIVATE HEALTH INSURANCE

## 2012-01-22 DIAGNOSIS — I10 Essential (primary) hypertension: Secondary | ICD-10-CM

## 2012-01-22 DIAGNOSIS — E1065 Type 1 diabetes mellitus with hyperglycemia: Secondary | ICD-10-CM

## 2012-01-22 DIAGNOSIS — E785 Hyperlipidemia, unspecified: Secondary | ICD-10-CM

## 2012-01-22 DIAGNOSIS — IMO0002 Reserved for concepts with insufficient information to code with codable children: Secondary | ICD-10-CM

## 2012-01-22 DIAGNOSIS — Z0000 Encounter for general adult medical examination without abnormal findings: Secondary | ICD-10-CM

## 2012-01-22 LAB — TSH: TSH: 0.99 u[IU]/mL (ref 0.35–5.50)

## 2012-01-22 LAB — MICROALBUMIN / CREATININE URINE RATIO
Creatinine,U: 152 mg/dL
Microalb Creat Ratio: 0.3 mg/g (ref 0.0–30.0)

## 2012-01-22 LAB — CBC WITH DIFFERENTIAL/PLATELET
Basophils Relative: 0.7 % (ref 0.0–3.0)
Hemoglobin: 14.3 g/dL (ref 13.0–17.0)
Lymphocytes Relative: 24.5 % (ref 12.0–46.0)
MCHC: 33.8 g/dL (ref 30.0–36.0)
Monocytes Relative: 8.6 % (ref 3.0–12.0)
Neutro Abs: 5.3 10*3/uL (ref 1.4–7.7)
RBC: 4.53 Mil/uL (ref 4.22–5.81)

## 2012-01-22 LAB — BASIC METABOLIC PANEL
BUN: 20 mg/dL (ref 6–23)
Calcium: 9.5 mg/dL (ref 8.4–10.5)
Creatinine, Ser: 0.9 mg/dL (ref 0.4–1.5)
GFR: 91.87 mL/min (ref >60.00)
Glucose, Bld: 97 mg/dL (ref 70–99)
Potassium: 4.6 mEq/L (ref 3.5–5.1)

## 2012-01-22 LAB — LDL CHOLESTEROL, DIRECT: Direct LDL: 105.2 mg/dL

## 2012-01-22 LAB — POCT URINALYSIS DIPSTICK
Bilirubin, UA: NEGATIVE
Ketones, UA: NEGATIVE
Leukocytes, UA: NEGATIVE

## 2012-01-22 LAB — HEPATIC FUNCTION PANEL
Albumin: 3.9 g/dL (ref 3.5–5.2)
Alkaline Phosphatase: 56 U/L (ref 39–117)
Total Bilirubin: 0.4 mg/dL (ref 0.3–1.2)

## 2012-01-22 LAB — LIPID PANEL: VLDL: 60.2 mg/dL — ABNORMAL HIGH (ref 0.0–40.0)

## 2012-01-24 ENCOUNTER — Other Ambulatory Visit: Payer: Managed Care, Other (non HMO)

## 2012-01-31 ENCOUNTER — Encounter: Payer: Self-pay | Admitting: Family Medicine

## 2012-01-31 ENCOUNTER — Encounter: Payer: Managed Care, Other (non HMO) | Admitting: Family Medicine

## 2012-01-31 ENCOUNTER — Ambulatory Visit (INDEPENDENT_AMBULATORY_CARE_PROVIDER_SITE_OTHER): Payer: PRIVATE HEALTH INSURANCE | Admitting: Family Medicine

## 2012-01-31 VITALS — BP 130/84 | Temp 97.7°F | Ht 71.0 in | Wt 247.0 lb

## 2012-01-31 DIAGNOSIS — E785 Hyperlipidemia, unspecified: Secondary | ICD-10-CM

## 2012-01-31 DIAGNOSIS — I10 Essential (primary) hypertension: Secondary | ICD-10-CM

## 2012-01-31 DIAGNOSIS — R319 Hematuria, unspecified: Secondary | ICD-10-CM

## 2012-01-31 DIAGNOSIS — E1065 Type 1 diabetes mellitus with hyperglycemia: Secondary | ICD-10-CM

## 2012-01-31 DIAGNOSIS — N529 Male erectile dysfunction, unspecified: Secondary | ICD-10-CM

## 2012-01-31 MED ORDER — METFORMIN HCL 1000 MG PO TABS: 1000.0000 mg | ORAL_TABLET | Freq: Two times a day (BID) | ORAL | Status: DC

## 2012-01-31 MED ORDER — INSULIN LISPRO PROT & LISPRO (75-25 MIX) 100 UNIT/ML ~~LOC~~ SUSP: 25.0000 [IU] | Freq: Every day | SUBCUTANEOUS | Status: DC

## 2012-01-31 MED ORDER — LOSARTAN POTASSIUM 100 MG PO TABS: 100.0000 mg | ORAL_TABLET | Freq: Every day | ORAL | Status: DC

## 2012-01-31 MED ORDER — ATORVASTATIN CALCIUM 10 MG PO TABS: 10.0000 mg | ORAL_TABLET | Freq: Every day | ORAL | Status: DC

## 2012-01-31 MED ORDER — GLUCOSE BLOOD VI STRP: 1.0000 | ORAL_STRIP | Freq: Two times a day (BID) | Status: DC

## 2012-01-31 MED ORDER — SILDENAFIL CITRATE 50 MG PO TABS: 50.0000 mg | ORAL_TABLET | ORAL | Status: DC | PRN

## 2012-01-31 MED ORDER — INSULIN PEN NEEDLE 31G X 8 MM MISC: 1.0000 | Freq: Every day | Status: DC

## 2012-01-31 NOTE — Progress Notes (Signed)
  Subjective:    Patient ID: Robert Cordova, male    DOB: 08/18/1950, 61 y.o.   MRN: [id number]  HPI Robert Cordova is a 61 year old married male nonsmoker who comes in today for physical evaluation because of underlying hypertension diabetes and hyperlipidemia  His fasting blood sugar is below 100 his A1c now is down to 6.7%. He's on 25 units of insulin daily along with metformin 1000 mg twice a day  He also takes an aspirin tablet.  He takes losartan 100 mg.......... one half tablets daily for hypertension  He is also experiencing some erectile dysfunction for no diabetes  His LDL is 105 we will start him on a low-dose statin to get his LDL below 75 Review of Systems  Constitutional: Negative.   HENT: Negative.   Eyes: Negative.   Respiratory: Negative.   Cardiovascular: Negative.   Gastrointestinal: Negative.   Genitourinary: Negative.   Musculoskeletal: Negative.   Skin: Negative.   Neurological: Negative.   Hematological: Negative.   Psychiatric/Behavioral: Negative.        Objective:   Physical Exam  Constitutional: He is oriented to person, place, and time. He appears well-developed and well-nourished.  HENT:  Head: Normocephalic and atraumatic.  Right Ear: External ear normal.  Left Ear: External ear normal.  Nose: Nose normal.  Mouth/Throat: Oropharynx is clear and moist.  Eyes: Conjunctivae and EOM are normal. Pupils are equal, round, and reactive to light.  Neck: Normal range of motion. Neck supple. No JVD present. No tracheal deviation present. No thyromegaly present.  Cardiovascular: Normal rate, regular rhythm, normal heart sounds and intact distal pulses.  Exam reveals no gallop and no friction rub.   No murmur heard. Pulmonary/Chest: Effort normal and breath sounds normal. No stridor. No respiratory distress. He has no wheezes. He has no rales. He exhibits no tenderness.  Abdominal: Soft. Bowel sounds are normal. He exhibits no distension and no mass. There is  no tenderness. There is no rebound and no guarding.  Genitourinary: Rectum normal, prostate normal and penis normal. Guaiac negative stool. No penile tenderness.  Musculoskeletal: Normal range of motion. He exhibits no edema and no tenderness.  Lymphadenopathy:    He has no cervical adenopathy.  Neurological: He is alert and oriented to person, place, and time. He has normal reflexes. No cranial nerve deficit. He exhibits normal muscle tone.  Skin: Skin is warm and dry. No rash noted. No erythema. No pallor.  Psychiatric: He has a normal mood and affect. His behavior is normal. Judgment and thought content normal.          Assessment & Plan:   healthy male  Diabetes type 1 continue current therapy note hypertension continue current therapy  Add 10 mg of Lipitor to decrease LDL below 75  Add Viagra for erectile dysfunction followup in 3 months

## 2012-01-31 NOTE — Patient Instructions (Signed)
Continue your current medications  Check a fasting blood sugar once daily in the morning  Followup in 3 months labs one week prior  Add Lipitor 10 mg,,,,,,,,,,,, 1 tablet daily at bedtime  Add Viagra 50 mg,,,,,,,,,,,,,, one half tablet 2 hours prior to sex with water

## 2012-02-26 ENCOUNTER — Other Ambulatory Visit: Payer: Self-pay | Admitting: Family Medicine

## 2012-03-19 ENCOUNTER — Telehealth: Payer: Self-pay | Admitting: Family Medicine

## 2012-03-19 DIAGNOSIS — E785 Hyperlipidemia, unspecified: Secondary | ICD-10-CM

## 2012-03-19 DIAGNOSIS — E1065 Type 1 diabetes mellitus with hyperglycemia: Secondary | ICD-10-CM

## 2012-03-19 NOTE — Telephone Encounter (Signed)
Caller: Randy/Patient; Patient Name: Robert Cordova ; PCP: Roderick Pee.; Best Callback Phone Number: [phone] Pt calling on 03/19/12 states he has a new insurance and needs to transfer prescriptions to [facility]/pt asked for Boykin Reaper to assist him/he also needs to "update" his prescriptions

## 2012-03-20 MED ORDER — LOSARTAN POTASSIUM 100 MG PO TABS: ORAL_TABLET | ORAL | Status: DC

## 2012-03-20 MED ORDER — GLUCOSE BLOOD VI STRP: 1.0000 | ORAL_STRIP | Freq: Two times a day (BID) | Status: DC

## 2012-03-20 MED ORDER — FREESTYLE LANCETS MISC: Status: AC

## 2012-03-20 MED ORDER — INSULIN LISPRO PROT & LISPRO (75-25 MIX) 100 UNIT/ML ~~LOC~~ SUSP: SUBCUTANEOUS | Status: DC

## 2012-03-20 MED ORDER — OMEPRAZOLE 40 MG PO CPDR: 40.0000 mg | DELAYED_RELEASE_CAPSULE | Freq: Every day | ORAL | Status: DC

## 2012-03-20 MED ORDER — ATORVASTATIN CALCIUM 10 MG PO TABS: 10.0000 mg | ORAL_TABLET | Freq: Every day | ORAL | Status: DC

## 2012-03-20 MED ORDER — INSULIN PEN NEEDLE 31G X 8 MM MISC: 1.0000 | Freq: Every day | Status: DC

## 2012-03-20 MED ORDER — METFORMIN HCL 1000 MG PO TABS: 1000.0000 mg | ORAL_TABLET | Freq: Two times a day (BID) | ORAL | Status: DC

## 2012-03-20 NOTE — Telephone Encounter (Signed)
Spoke with patient.

## 2012-03-26 ENCOUNTER — Other Ambulatory Visit: Payer: Self-pay | Admitting: Family Medicine

## 2012-04-01 ENCOUNTER — Encounter: Payer: Self-pay | Admitting: Family Medicine

## 2012-04-10 ENCOUNTER — Other Ambulatory Visit (INDEPENDENT_AMBULATORY_CARE_PROVIDER_SITE_OTHER): Payer: PRIVATE HEALTH INSURANCE

## 2012-04-10 DIAGNOSIS — E785 Hyperlipidemia, unspecified: Secondary | ICD-10-CM

## 2012-04-10 DIAGNOSIS — Z0000 Encounter for general adult medical examination without abnormal findings: Secondary | ICD-10-CM

## 2012-04-10 DIAGNOSIS — I10 Essential (primary) hypertension: Secondary | ICD-10-CM

## 2012-04-10 DIAGNOSIS — E1065 Type 1 diabetes mellitus with hyperglycemia: Secondary | ICD-10-CM

## 2012-04-10 LAB — LIPID PANEL
HDL: 38.5 mg/dL — ABNORMAL LOW (ref >39.00)
Total CHOL/HDL Ratio: 4
VLDL: 47.8 mg/dL — ABNORMAL HIGH (ref 0.0–40.0)

## 2012-04-10 LAB — HEPATIC FUNCTION PANEL
Albumin: 4 g/dL (ref 3.5–5.2)
Alkaline Phosphatase: 53 U/L (ref 39–117)
Bilirubin, Direct: 0 mg/dL (ref 0.0–0.3)

## 2012-04-10 LAB — POCT URINALYSIS DIPSTICK
Bilirubin, UA: NEGATIVE
Glucose, UA: NEGATIVE
Ketones, UA: NEGATIVE
Leukocytes, UA: NEGATIVE
Nitrite, UA: NEGATIVE
pH, UA: 5

## 2012-04-10 LAB — BASIC METABOLIC PANEL
BUN: 20 mg/dL (ref 6–23)
Chloride: 106 mEq/L (ref 96–112)
Glucose, Bld: 130 mg/dL — ABNORMAL HIGH (ref 70–99)
Potassium: 4.7 mEq/L (ref 3.5–5.1)
Sodium: 137 mEq/L (ref 135–145)

## 2012-04-25 ENCOUNTER — Other Ambulatory Visit: Payer: PRIVATE HEALTH INSURANCE

## 2012-04-26 ENCOUNTER — Encounter: Payer: Self-pay | Admitting: Gastroenterology

## 2012-05-02 ENCOUNTER — Ambulatory Visit: Payer: PRIVATE HEALTH INSURANCE | Admitting: Family Medicine

## 2012-05-15 ENCOUNTER — Encounter: Payer: Self-pay | Admitting: Family Medicine

## 2012-05-15 ENCOUNTER — Ambulatory Visit (INDEPENDENT_AMBULATORY_CARE_PROVIDER_SITE_OTHER): Payer: PRIVATE HEALTH INSURANCE | Admitting: Family Medicine

## 2012-05-15 VITALS — BP 170/92 | HR 72 | Temp 98.2°F | Resp 18 | Ht 71.0 in | Wt 248.0 lb

## 2012-05-15 DIAGNOSIS — Z23 Encounter for immunization: Secondary | ICD-10-CM

## 2012-05-15 DIAGNOSIS — E1065 Type 1 diabetes mellitus with hyperglycemia: Secondary | ICD-10-CM

## 2012-05-15 DIAGNOSIS — E785 Hyperlipidemia, unspecified: Secondary | ICD-10-CM

## 2012-05-15 DIAGNOSIS — I10 Essential (primary) hypertension: Secondary | ICD-10-CM

## 2012-05-15 MED ORDER — OMEPRAZOLE 40 MG PO CPDR: 40.0000 mg | DELAYED_RELEASE_CAPSULE | Freq: Every day | ORAL | Status: DC

## 2012-05-15 NOTE — Progress Notes (Signed)
  Subjective:    Patient ID: Robert Cordova, male    DOB: February 03, 1951, 61 y.o.   MRN: [id number]  HPI Robert Cordova is a 61 year old male married nonsmoker who comes in today for followup of hypertension diabetes and hyperlipidemia  We started him on 10 mg of Lipitor each bedtime because of hyperlipidemia. His total cholesterol has dropped from 185-160 his LDL has dropped from 105-84.  His blood pressure today is 180/90 right arm sitting position repeat by me same. He's taking Cozaar 150 mg daily for hypertension. He states he is compliant with his medication. BP a couple months ago 134/84  His blood sugar on 30 units of insulin each bedtime, metformin 1000 mg twice a day and he also takes 10 units in the morning but only when necessary if his blood sugars over 140. A1c is 7.1. Previous A1c 3 months ago 6.7.  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

## 2012-05-15 NOTE — Patient Instructions (Signed)
Continue your current medications  Check your blood pressure daily in the morning  Return in one week with the data and the device

## 2012-05-20 ENCOUNTER — Encounter: Payer: Self-pay | Admitting: Family Medicine

## 2012-05-22 ENCOUNTER — Encounter: Payer: Self-pay | Admitting: Family Medicine

## 2012-05-22 ENCOUNTER — Ambulatory Visit (INDEPENDENT_AMBULATORY_CARE_PROVIDER_SITE_OTHER): Payer: PRIVATE HEALTH INSURANCE | Admitting: Family Medicine

## 2012-05-22 VITALS — BP 130/90 | Temp 98.0°F | Wt 248.0 lb

## 2012-05-22 DIAGNOSIS — I10 Essential (primary) hypertension: Secondary | ICD-10-CM

## 2012-05-22 NOTE — Progress Notes (Signed)
  Subjective:    Patient ID: Robert Cordova, male    DOB: 11-10-1950, 61 y.o.   MRN: [id number]  HPI Robert Cordova is a 61 year old married male nonsmoker who comes in today for evaluation of hypertension  We had him on the branded Cozaar  150 mg daily and his blood pressure was normal. His insurance company switched him to a generic product and now his blood pressure has seemed to gone up. It was running 150/100. He switched back to the original branded products BP today 135/82  I asked him to go back to the generic product measuring his blood pressure daily in the morning and fax me the data in 2 weeks. If indeed the generic product is not as effective as the brand name then we will go back and use the branded product  Review of Systems    otherwise negative blood sugar normal Objective:   Physical Exam  BP right arm sitting position 135/82 pulse 70 irregular      Assessment & Plan:  Hypertension at goal,,,,,,,,,,,,,,,, switch back to the generic product to see if it's just as effective. Fax me the data in 2 weeks

## 2012-05-22 NOTE — Patient Instructions (Signed)
Switch back to the generic blood pressure medication  Check your blood pressure daily in the morning  Fax me the data in 2 weeks and I will call you to discuss your treatment options

## 2012-07-02 ENCOUNTER — Other Ambulatory Visit: Payer: Self-pay | Admitting: Family Medicine

## 2012-07-02 ENCOUNTER — Telehealth: Payer: Self-pay | Admitting: *Deleted

## 2012-07-02 DIAGNOSIS — I10 Essential (primary) hypertension: Secondary | ICD-10-CM

## 2012-07-02 MED ORDER — AMLODIPINE BESYLATE 5 MG PO TABS: 5.0000 mg | ORAL_TABLET | Freq: Every day | ORAL | Status: DC

## 2012-07-02 NOTE — Telephone Encounter (Signed)
Dr Tawanna Cooler spoke with patient.  Norvasc 5 mg in the a.m. Was added to his cozaar 100 mg. Insuline is increased to 35 in the AM and 50 in the PM to help reach is glucose readings of 100. Patient is to keep recording his numbers and fax back the result in 6 weeks.

## 2012-07-26 ENCOUNTER — Telehealth: Payer: Self-pay | Admitting: Family Medicine

## 2012-07-26 NOTE — Telephone Encounter (Signed)
Patient Information:  Caller Name: Dondra Spry  Phone: [phone]  Patient: Robert Cordova, Robert Cordova  Gender: Male  DOB: 23-Apr-1951  Age: 61 Years  PCP: Kelle Darting [facility])  Office Follow Up:  Does the office need to follow up with this patient?: No  Instructions For The Office: N/A  RN Note:  He is on insulin and Metformin and has been having diarrhea for a couple months  No abdominal pain or vomiting.  No fever. He blood sugars flucuate some but have not been real high.  He is to be seen in 2 weeks so will call back and schedule with his PCP   Symptoms  Reason For Call & Symptoms: diarrhea  Reviewed Health History In EMR: Yes  Reviewed Medications In EMR: Yes  Reviewed Allergies In EMR: Yes  Reviewed Surgeries / Procedures: Yes  Date of Onset of Symptoms: 05/14/2012  Guideline(s) Used:  Diarrhea  Disposition Per Guideline:   See Within 2 Weeks in Office  Reason For Disposition Reached:   Diarrhea is a chronic symptom (recurrent or ongoing AND lasting > 4 weeks)  Advice Given:  Fluids:  Drink more fluids, at least 8-10 glasses (8 oz or 240 ml) daily.  Call Back If:  Signs of dehydration occur (e.g., no urine for more than 12 hours, very dry mouth, lightheaded, etc.)  You become worse.

## 2012-08-21 ENCOUNTER — Encounter: Payer: Self-pay | Admitting: Gastroenterology

## 2012-08-23 ENCOUNTER — Telehealth: Payer: Self-pay | Admitting: Gastroenterology

## 2012-08-23 ENCOUNTER — Ambulatory Visit (AMBULATORY_SURGERY_CENTER): Payer: Self-pay | Admitting: *Deleted

## 2012-08-23 VITALS — Ht 71.0 in | Wt 255.2 lb

## 2012-08-23 DIAGNOSIS — Z1211 Encounter for screening for malignant neoplasm of colon: Secondary | ICD-10-CM

## 2012-08-23 DIAGNOSIS — Z86010 Personal history of colonic polyps: Secondary | ICD-10-CM

## 2012-08-23 MED ORDER — MOVIPREP 100 G PO SOLR: ORAL | Status: DC

## 2012-08-23 MED ORDER — NA SULFATE-K SULFATE-MG SULF 17.5-3.13-1.6 GM/177ML PO SOLN: ORAL | Status: DC

## 2012-08-23 NOTE — Telephone Encounter (Signed)
MoviPrep is backordered.  Rx for Suprep resent to pharmacy.  Pt notified.  Will mail Suprep instructions to pt and he will call us to review instructions when he receives them in the mail.

## 2012-09-02 ENCOUNTER — Ambulatory Visit (AMBULATORY_SURGERY_CENTER): Payer: BC Managed Care – PPO | Admitting: Gastroenterology

## 2012-09-02 ENCOUNTER — Encounter: Payer: Self-pay | Admitting: Gastroenterology

## 2012-09-02 VITALS — BP 133/75 | HR 54 | Temp 97.2°F | Resp 20 | Ht 71.0 in | Wt 248.0 lb

## 2012-09-02 DIAGNOSIS — Z860100 Personal history of colon polyps, unspecified: Secondary | ICD-10-CM

## 2012-09-02 DIAGNOSIS — Z1211 Encounter for screening for malignant neoplasm of colon: Secondary | ICD-10-CM

## 2012-09-02 DIAGNOSIS — D126 Benign neoplasm of colon, unspecified: Secondary | ICD-10-CM

## 2012-09-02 MED ORDER — SODIUM CHLORIDE 0.9 % IV SOLN: 500.0000 mL | INTRAVENOUS | Status: DC

## 2012-09-02 NOTE — Progress Notes (Signed)
Patient did not experience any of the following events: a burn prior to discharge; a fall within the facility; wrong site/side/patient/procedure/implant event; or a hospital transfer or hospital admission upon discharge from the facility. (G8907) Patient did not have preoperative order for IV antibiotic SSI prophylaxis. (G8918)  

## 2012-09-02 NOTE — Patient Instructions (Addendum)

## 2012-09-02 NOTE — Op Note (Signed)
[facility] [address] [place], [address]   COLONOSCOPY PROCEDURE REPORT  PATIENT: Robert, Cordova  MR#: [id number] BIRTHDATE: Aug 25, 1950 , 61  yrs. old GENDER: Male ENDOSCOPIST: Mardella Layman, MD, [facility] REFERRED BY: PROCEDURE DATE:  09/02/2012 PROCEDURE:   Colonoscopy with biopsy ASA CLASS:   Class II INDICATIONS:Patient's personal history of colon polyps. MEDICATIONS: propofol (Diprivan) 200mg  IV  DESCRIPTION OF PROCEDURE:   After the risks and benefits and of the procedure were explained, informed consent was obtained.  A digital rectal exam revealed no abnormalities of the rectum.    The LB [id number]  endoscope was introduced through the anus and advanced to the cecum, which was identified by both the appendix and ileocecal valve .  The quality of the prep was excellent, using MoviPrep .  The instrument was then slowly withdrawn as the colon was fully examined.     COLON FINDINGS: Mild diverticulosis was noted in the descending colon and sigmoid colon.   A diminutive flat polyp was found in the descending colon.  A biopsy was performed using cold forceps.   The colon was otherwise normal.  There was no inflammation, polyps or cancers unless previously stated.     Retroflexed views revealed no abnormalities.     The scope was then withdrawn from the patient and the procedure completed.  COMPLICATIONS: There were no complications. ENDOSCOPIC IMPRESSION: 1.   Mild diverticulosis was noted in the descending colon and sigmoid colon 2.   Diminutive flat polyp was found in the descending colon; biopsy was performed using cold forceps ...r/o adenoma 3.   The colon was otherwise normal  RECOMMENDATIONS: 1.  Await biopsy results 2.  Repeat Colonoscopy in 5 years. 3.  High fiber diet   REPEAT EXAM:  XB:[id number] Shawnie Dapper, MD  _______________________________ eSigned:  Mardella Layman, MD, [facility] 09/02/2012 9:28 AM

## 2012-09-02 NOTE — Progress Notes (Signed)
Called to room to assist during endoscopic procedure.  Patient ID and intended procedure confirmed with present staff. Received instructions for my participation in the procedure from the performing physician.  

## 2012-09-03 ENCOUNTER — Telehealth: Payer: Self-pay | Admitting: *Deleted

## 2012-09-03 NOTE — Telephone Encounter (Signed)
  Follow up Call-  Call back number 09/02/2012  Post procedure Call Back phone  # [phone] hm  Permission to leave phone message Yes     Patient questions:  Do you have a fever, pain , or abdominal swelling? no Pain Score  0 *  Have you tolerated food without any problems? yes  Have you been able to return to your normal activities? yes  Do you have any questions about your discharge instructions: Diet   no Medications  no Follow up visit  no  Do you have questions or concerns about your Care? no  Actions: * If pain score is 4 or above:0 No action needed, pain <4.

## 2012-09-06 ENCOUNTER — Encounter: Payer: Self-pay | Admitting: Gastroenterology

## 2012-09-25 ENCOUNTER — Other Ambulatory Visit: Payer: Self-pay | Admitting: *Deleted

## 2012-09-25 DIAGNOSIS — I10 Essential (primary) hypertension: Secondary | ICD-10-CM

## 2012-09-25 MED ORDER — OMEPRAZOLE 40 MG PO CPDR: 40.0000 mg | DELAYED_RELEASE_CAPSULE | Freq: Every day | ORAL | Status: DC

## 2012-09-25 MED ORDER — AMLODIPINE BESYLATE 5 MG PO TABS: 5.0000 mg | ORAL_TABLET | Freq: Every day | ORAL | Status: DC

## 2012-09-25 NOTE — Telephone Encounter (Signed)
Rx sent to pharmacy   

## 2012-10-04 ENCOUNTER — Other Ambulatory Visit: Payer: Self-pay | Admitting: *Deleted

## 2012-10-04 DIAGNOSIS — E785 Hyperlipidemia, unspecified: Secondary | ICD-10-CM

## 2012-10-04 MED ORDER — ATORVASTATIN CALCIUM 10 MG PO TABS: 10.0000 mg | ORAL_TABLET | Freq: Every day | ORAL | Status: DC

## 2012-10-04 MED ORDER — LOSARTAN POTASSIUM 100 MG PO TABS: ORAL_TABLET | ORAL | Status: DC

## 2012-10-04 MED ORDER — METFORMIN HCL 1000 MG PO TABS: 1000.0000 mg | ORAL_TABLET | Freq: Two times a day (BID) | ORAL | Status: DC

## 2012-10-04 MED ORDER — INSULIN PEN NEEDLE 31G X 8 MM MISC: 1.0000 | Freq: Every day | Status: DC

## 2012-10-04 MED ORDER — INSULIN LISPRO PROT & LISPRO (75-25 MIX) 100 UNIT/ML ~~LOC~~ SUSP: SUBCUTANEOUS | Status: DC

## 2012-10-16 ENCOUNTER — Telehealth: Payer: Self-pay | Admitting: Family Medicine

## 2012-10-16 MED ORDER — INSULIN LISPRO PROT & LISPRO (75-25 MIX) 100 UNIT/ML ~~LOC~~ SUSP: SUBCUTANEOUS | Status: DC

## 2012-10-16 NOTE — Telephone Encounter (Signed)
Patient called stating that his rx for humalog is incorrect and it is written as if he takes 25 units per day and he is taking 100 units per day. Please assist and send correct [facility] to [facility] on [address].

## 2012-10-22 ENCOUNTER — Telehealth: Payer: Self-pay | Admitting: *Deleted

## 2012-10-22 MED ORDER — INSULIN LISPRO PROT & LISPRO (75-25 MIX) 100 UNIT/ML ~~LOC~~ SUSP: SUBCUTANEOUS | Status: DC

## 2012-10-22 NOTE — Telephone Encounter (Signed)
Patient requests pen not vial.  rx sent.

## 2013-02-10 ENCOUNTER — Encounter: Payer: Self-pay | Admitting: Family

## 2013-02-10 ENCOUNTER — Ambulatory Visit (INDEPENDENT_AMBULATORY_CARE_PROVIDER_SITE_OTHER): Payer: BC Managed Care – PPO | Admitting: Family

## 2013-02-10 VITALS — BP 132/90 | Temp 97.9°F | Wt 259.0 lb

## 2013-02-10 DIAGNOSIS — R059 Cough, unspecified: Secondary | ICD-10-CM

## 2013-02-10 DIAGNOSIS — R05 Cough: Secondary | ICD-10-CM

## 2013-02-10 DIAGNOSIS — J069 Acute upper respiratory infection, unspecified: Secondary | ICD-10-CM

## 2013-02-10 DIAGNOSIS — E119 Type 2 diabetes mellitus without complications: Secondary | ICD-10-CM

## 2013-02-10 MED ORDER — FLUTICASONE PROPIONATE 50 MCG/ACT NA SUSP: 2.0000 | Freq: Every day | NASAL | Status: DC

## 2013-02-10 NOTE — Progress Notes (Signed)
Subjective:    Patient ID: Robert Cordova, male    DOB: [birth year], 62 y.o.   MRN: [id number]  HPI 62 year old white male, nonsmoker, patient of Dr. Tawanna Cooler is in today with complaints of cough and nasal congestion that has improved over the last week and a half. He had been taken Delsym. His wife is concerned today that the cough is lingering. He has a history of type 2 diabetes. Blood sugar this morning was 150. Is currently taking Humalog, Glucophage and tolerating it well. He has not seen Dr. Tawanna Cooler since October.   Review of Systems  Constitutional: Negative.   HENT: Positive for congestion and postnasal drip. Negative for sinus pressure.   Eyes: Negative.   Respiratory: Positive for cough.   Cardiovascular: Negative.   Musculoskeletal: Negative.   Allergic/Immunologic: Negative.   Neurological: Negative.   Psychiatric/Behavioral: Negative.    Past Medical History  Diagnosis Date  . ABNORMAL GLUCOSE NEC 03/18/2007  . BLEPHARITIS, LEFT 10/29/2009  . DIABETES-TYPE 2 04/18/2010  . HYPERLIPIDEMIA 10/22/2007  . HYPERTENSION 03/18/2007  . NECK PAIN, CHRONIC 03/18/2007  . NEVUS, MELANOCYTIC, FACE 10/07/2009    History   Social History  . Marital Status: Married    Spouse Name: N/A    Number of Children: N/A  . Years of Education: N/A   Occupational History  . Not on file.   Social History Main Topics  . Smoking status: Never Smoker   . Smokeless tobacco: Never Used  . Alcohol Use: Yes     Comment: socially per the pt  . Drug Use: No  . Sexually Active: Not on file   Other Topics Concern  . Not on file   Social History Narrative  . No narrative on file    Past Surgical History  Procedure Laterality Date  . Colonoscopy  2009    Dr. Jarold Motto; multiple polyps  . No prior surgery      Family History  Problem Relation Age of Onset  . Hyperlipidemia    . Hypertension    . Heart attack    . Diabetes    . Colon cancer Neg Hx   . Stomach cancer Neg Hx   .  Esophageal cancer Neg Hx   . Rectal cancer Neg Hx     Allergies  Allergen Reactions  . Ace Inhibitors     REACTION: Coughing    Current Outpatient Prescriptions on File Prior to Visit  Medication Sig Dispense Refill  . amLODipine (NORVASC) 5 MG tablet Take 1 tablet (5 mg total) by mouth daily.  90 tablet  3  . aspirin 81 MG tablet Take 81 mg by mouth daily.      [name] atorvastatin (LIPITOR) 10 MG tablet Take 1 tablet (10 mg total) by mouth daily.  90 tablet  3  . glucose blood (FREESTYLE LITE) test strip 1 each by Other route 2 (two) times daily. Use as instructed  100 each  3  . insulin lispro protamine-insulin lispro (HUMALOG MIX 75/25 PEN) (75-25) 100 UNIT/ML SUSP 40 units in the am and 60 units in the pm  100 mL  11  . Insulin Pen Needle 31G X 8 MM MISC 1 each by Does not apply route daily.  100 each  3  . Lancets (FREESTYLE) lancets Use as instructed  100 each  12  . losartan (COZAAR) 100 MG tablet One and half tab daily  150 tablet  3  . metFORMIN (GLUCOPHAGE) 1000 MG tablet Take 1 tablet (1,000 mg  total) by mouth 2 (two) times daily with a meal.  200 tablet  3  . MINOXIDIL, TOPICAL, (ROGAINE EXTRA STRENGTH) 5 % SOLN Apply small amounts once daily  180 mL  6  . omeprazole (PRILOSEC) 40 MG capsule Take 1 capsule (40 mg total) by mouth daily.  90 capsule  3  . sildenafil (VIAGRA) 50 MG tablet Take 50 mg by mouth as needed.       No current facility-administered medications on file prior to visit.    BP 132/90  Temp(Src) 97.9 F (36.6 C) (Oral)  Wt 259 lb (117.482 kg)  BMI 36.14 kg/m2chart    Objective:   Physical Exam  Constitutional: He is oriented to person, place, and time. He appears well-developed and well-nourished.  HENT:  Right Ear: External ear normal.  Left Ear: External ear normal.  Nose: Nose normal.  Mouth/Throat: Oropharynx is clear and moist.  Neck: Normal range of motion. Neck supple.  Cardiovascular: Normal rate, regular rhythm and normal heart sounds.    Pulmonary/Chest: Effort normal and breath sounds normal.  Musculoskeletal: Normal range of motion.  Neurological: He is alert and oriented to person, place, and time.  Skin: Skin is warm and dry.  Psychiatric: He has a normal mood and affect.          Assessment & Plan:  Assessment:  1. Upper respiratory infection 2. Cough 3. Type 2 diabetes  Plan: Explained to patient the cough is often one of the last symptoms to resolve. We will try Flonase to help control the postnasal drip 2 sprays in each nostril once a day. Follow with Dr. Tawanna Cooler is in as possible for fasting blood work and recheck. Call with any questions or concerns.

## 2013-02-10 NOTE — Patient Instructions (Addendum)

## 2013-03-14 ENCOUNTER — Other Ambulatory Visit: Payer: Self-pay | Admitting: Family Medicine

## 2013-03-20 ENCOUNTER — Other Ambulatory Visit: Payer: Self-pay | Admitting: Family Medicine

## 2013-04-02 ENCOUNTER — Encounter: Payer: Self-pay | Admitting: Family Medicine

## 2013-04-04 ENCOUNTER — Other Ambulatory Visit (INDEPENDENT_AMBULATORY_CARE_PROVIDER_SITE_OTHER): Payer: BC Managed Care – PPO

## 2013-04-04 DIAGNOSIS — Z0000 Encounter for general adult medical examination without abnormal findings: Secondary | ICD-10-CM

## 2013-04-04 LAB — TSH: TSH: 1.13 u[IU]/mL (ref 0.35–5.50)

## 2013-04-04 LAB — POCT URINALYSIS DIPSTICK
Glucose, UA: NEGATIVE
Ketones, UA: NEGATIVE
Protein, UA: NEGATIVE
Spec Grav, UA: >=1.03

## 2013-04-04 LAB — HEPATIC FUNCTION PANEL
Alkaline Phosphatase: 68 U/L (ref 39–117)
Bilirubin, Direct: 0.1 mg/dL (ref 0.0–0.3)
Total Bilirubin: 0.7 mg/dL (ref 0.3–1.2)

## 2013-04-04 LAB — PSA: PSA: 0.93 ng/mL (ref 0.10–4.00)

## 2013-04-04 LAB — CBC WITH DIFFERENTIAL/PLATELET
Basophils Absolute: 0 10*3/uL (ref 0.0–0.1)
Eosinophils Relative: 2.4 % (ref 0.0–5.0)
Lymphocytes Relative: 28.8 % (ref 12.0–46.0)
Lymphs Abs: 1.8 10*3/uL (ref 0.7–4.0)
Monocytes Relative: 10.1 % (ref 3.0–12.0)
Neutrophils Relative %: 58 % (ref 43.0–77.0)
Platelets: 266 10*3/uL (ref 150.0–400.0)
RDW: 12.6 % (ref 11.5–14.6)
WBC: 6.4 10*3/uL (ref 4.5–10.5)

## 2013-04-04 LAB — LIPID PANEL
Cholesterol: 161 mg/dL (ref 0–200)
Total CHOL/HDL Ratio: 5
Triglycerides: 332 mg/dL — ABNORMAL HIGH (ref 0.0–149.0)
VLDL: 66.4 mg/dL — ABNORMAL HIGH (ref 0.0–40.0)

## 2013-04-04 LAB — MICROALBUMIN / CREATININE URINE RATIO
Creatinine,U: 259.9 mg/dL
Microalb Creat Ratio: 0.5 mg/g (ref 0.0–30.0)

## 2013-04-04 LAB — BASIC METABOLIC PANEL
BUN: 16 mg/dL (ref 6–23)
Calcium: 9.3 mg/dL (ref 8.4–10.5)
Creatinine, Ser: 0.8 mg/dL (ref 0.4–1.5)
GFR: 109.81 mL/min (ref >60.00)
Glucose, Bld: 172 mg/dL — ABNORMAL HIGH (ref 70–99)
Potassium: 4.3 mEq/L (ref 3.5–5.1)

## 2013-04-08 ENCOUNTER — Telehealth: Payer: Self-pay | Admitting: Family Medicine

## 2013-04-08 DIAGNOSIS — R7309 Other abnormal glucose: Secondary | ICD-10-CM

## 2013-04-08 DIAGNOSIS — E119 Type 2 diabetes mellitus without complications: Secondary | ICD-10-CM

## 2013-04-08 NOTE — Telephone Encounter (Signed)
Pt returned your call. He is available now.

## 2013-04-08 NOTE — Telephone Encounter (Signed)
Message copied by Trenton Gammon on Tue Apr 08, 2013  1:53 PM ------      Message from: TODD, JEFFREY A      Created: Tue Apr 08, 2013 12:03 PM       Fleet Contras have him increase his morning dose of insulin to 50 units daily, continued evening dose of 60 units daily and let's set him up with an endocrinology consult since his A1c is too high ------

## 2013-04-10 ENCOUNTER — Encounter: Payer: BC Managed Care – PPO | Admitting: Family Medicine

## 2013-04-10 ENCOUNTER — Ambulatory Visit (INDEPENDENT_AMBULATORY_CARE_PROVIDER_SITE_OTHER): Payer: BC Managed Care – PPO | Admitting: Internal Medicine

## 2013-04-10 ENCOUNTER — Encounter: Payer: Self-pay | Admitting: Internal Medicine

## 2013-04-10 VITALS — BP 150/90 | HR 78 | Temp 98.1°F | Resp 20 | Ht 70.5 in | Wt 264.0 lb

## 2013-04-10 DIAGNOSIS — E785 Hyperlipidemia, unspecified: Secondary | ICD-10-CM

## 2013-04-10 DIAGNOSIS — Z0000 Encounter for general adult medical examination without abnormal findings: Secondary | ICD-10-CM

## 2013-04-10 DIAGNOSIS — E669 Obesity, unspecified: Secondary | ICD-10-CM | POA: Insufficient documentation

## 2013-04-10 DIAGNOSIS — I10 Essential (primary) hypertension: Secondary | ICD-10-CM

## 2013-04-10 DIAGNOSIS — E1065 Type 1 diabetes mellitus with hyperglycemia: Secondary | ICD-10-CM

## 2013-04-10 NOTE — Progress Notes (Signed)
Subjective:    Patient ID: Robert Cordova, male    DOB: 01/14/1951, 62 y.o.   MRN: [id number]  HPI  62 year old patient who is seen today for a preventive health examination He has a history of exogenous obesity and insulin requiring type 2 diabetes. Hemoglobin A1c was recently elevated and his insulin regimen titrated. The patient has also been referred to endocrinology. At the present time he is on a mixed split dose of 75/25 insulin 50 units in the morning and 60 in the afternoon He's had a colonoscopy in January of this year. History of hypertension and a history of dyslipidemia.  Family history father died at 31 history of coronary artery disease and hypertension. Mother age 55 with hypertension and dyslipidemia one brother is well  Past Medical History  Diagnosis Date  . ABNORMAL GLUCOSE NEC 03/18/2007  . BLEPHARITIS, LEFT 10/29/2009  . DIABETES-TYPE 2 04/18/2010  . HYPERLIPIDEMIA 10/22/2007  . HYPERTENSION 03/18/2007  . NECK PAIN, CHRONIC 03/18/2007  . NEVUS, MELANOCYTIC, FACE 10/07/2009    History   Social History  . Marital Status: Married    Spouse Name: N/A    Number of Children: N/A  . Years of Education: N/A   Occupational History  . Not on file.   Social History Main Topics  . Smoking status: Never Smoker   . Smokeless tobacco: Never Used  . Alcohol Use: Yes     Comment: socially per the pt  . Drug Use: No  . Sexual Activity: Not on file   Other Topics Concern  . Not on file   Social History Narrative  . No narrative on file    Past Surgical History  Procedure Laterality Date  . Colonoscopy  2009    Dr. Jarold Motto; multiple polyps  . No prior surgery      Family History  Problem Relation Age of Onset  . Hyperlipidemia    . Hypertension    . Heart attack    . Diabetes    . Colon cancer Neg Hx   . Stomach cancer Neg Hx   . Esophageal cancer Neg Hx   . Rectal cancer Neg Hx     Allergies  Allergen Reactions  . Ace Inhibitors     REACTION:  Coughing    Current Outpatient Prescriptions on File Prior to Visit  Medication Sig Dispense Refill  . amLODipine (NORVASC) 5 MG tablet Take 1 tablet (5 mg total) by mouth daily.  90 tablet  3  . aspirin 81 MG tablet Take 81 mg by mouth daily.      [name] atorvastatin (LIPITOR) 10 MG tablet Take 1 tablet (10 mg total) by mouth daily.  90 tablet  3  . atorvastatin (LIPITOR) 10 MG tablet TAKE 1 TABLET (10 MG TOTAL) BY MOUTH DAILY.  90 tablet  2  . B-D ULTRAFINE III SHORT PEN 31G X 8 MM MISC USE AS DIRECTED  100 each  2  . glucose blood (FREESTYLE LITE) test strip 1 each by Other route 2 (two) times daily. Use as instructed  100 each  3  . insulin lispro protamine-lispro (HUMALOG 75/25) (75-25) 100 UNIT/ML SUSP injection 50 units in the am and 60 units in the pm      . Insulin Pen Needle 31G X 8 MM MISC 1 each by Does not apply route daily.  100 each  3  . losartan (COZAAR) 100 MG tablet One and half tab daily  150 tablet  3  . metFORMIN (GLUCOPHAGE) 1000  MG tablet Take 1 tablet (1,000 mg total) by mouth 2 (two) times daily with a meal.  200 tablet  3  . MINOXIDIL, TOPICAL, (ROGAINE EXTRA STRENGTH) 5 % SOLN Apply small amounts once daily  180 mL  6  . omeprazole (PRILOSEC) 40 MG capsule Take 1 capsule (40 mg total) by mouth daily.  90 capsule  3  . VIAGRA 50 MG tablet TAKE 1 TABLET (50 MG TOTAL) BY MOUTH AS NEEDED FOR ERECTILE DYSFUNCTION.  6 tablet  0   No current facility-administered medications on file prior to visit.    BP 150/90  Pulse 78  Temp(Src) 98.1 F (36.7 C) (Oral)  Resp 20  Ht 5' 10.5" (1.791 m)  Wt 264 lb (119.75 kg)  BMI 37.33 kg/m2  SpO2 96%       Review of Systems  Constitutional: Negative for fever, chills, activity change, appetite change and fatigue.  HENT: Negative for hearing loss, ear pain, congestion, rhinorrhea, sneezing, mouth sores, trouble swallowing, neck pain, neck stiffness, dental problem, voice change, sinus pressure and tinnitus.   Eyes: Negative  for photophobia, pain, redness and visual disturbance.  Respiratory: Negative for apnea, cough, choking, chest tightness, shortness of breath and wheezing.   Cardiovascular: Negative for chest pain, palpitations and leg swelling.  Gastrointestinal: Negative for nausea, vomiting, abdominal pain, diarrhea, constipation, blood in stool, abdominal distention, anal bleeding and rectal pain.  Genitourinary: Negative for dysuria, urgency, frequency, hematuria, flank pain, decreased urine volume, discharge, penile swelling, scrotal swelling, difficulty urinating, genital sores and testicular pain.  Musculoskeletal: Negative for myalgias, back pain, joint swelling, arthralgias and gait problem.  Skin: Negative for color change, rash and wound.  Neurological: Negative for dizziness, tremors, seizures, syncope, facial asymmetry, speech difficulty, weakness, light-headedness, numbness and headaches.  Hematological: Negative for adenopathy. Does not bruise/bleed easily.  Psychiatric/Behavioral: Negative for suicidal ideas, hallucinations, behavioral problems, confusion, sleep disturbance, self-injury, dysphoric mood, decreased concentration and agitation. The patient is not nervous/anxious.        Objective:   Physical Exam  Constitutional: He appears well-developed and well-nourished.  HENT:  Head: Normocephalic and atraumatic.  Right Ear: External ear normal.  Left Ear: External ear normal.  Nose: Nose normal.  Mouth/Throat: Oropharynx is clear and moist.  Eyes: Conjunctivae and EOM are normal. Pupils are equal, round, and reactive to light. No scleral icterus.  Neck: Normal range of motion. Neck supple. No JVD present. No thyromegaly present.  Cardiovascular: Regular rhythm, normal heart sounds and intact distal pulses.  Exam reveals no gallop and no friction rub.   No murmur heard. Pulmonary/Chest: Effort normal and breath sounds normal. He exhibits no tenderness.  Abdominal: Soft. Bowel sounds are  normal. He exhibits no distension and no mass. There is no tenderness.  Genitourinary: Prostate normal and penis normal.  Musculoskeletal: Normal range of motion. He exhibits no edema and no tenderness.  Lymphadenopathy:    He has no cervical adenopathy.  Neurological: He is alert. He has normal reflexes. No cranial nerve deficit. Coordination normal.  Skin: Skin is warm and dry. No rash noted.  Psychiatric: He has a normal mood and affect. His behavior is normal.          Assessment & Plan:   Preventive health examination Diabetes mellitus type 2. The patient has been referred to endocrinology. Insulin therapy recently adjusted Exogenous obesity. Weight loss exercise encouraged. Will be evaluated by endocrine we'll consider referral to dietitian Hypertension blood pressure 140-150 systolic over 90. Exercise weight loss low  salt diet all encouraged. Home blood pressure monitoring encouraged we'll reassess in 8 weeks Dyslipidemia. Continue statin therapy

## 2013-04-10 NOTE — Patient Instructions (Signed)
Limit your sodium (Salt) intake    It is important that you exercise regularly, at least 20 minutes 3 to 4 times per week.  If you develop chest pain or shortness of breath seek  medical attention.  Please check your blood pressure on a regular basis.  If it is consistently greater than 150/90, please make an office appointment.  You need to lose weight.  Consider a lower calorie diet and regular exercise. 

## 2013-04-18 ENCOUNTER — Encounter: Payer: Self-pay | Admitting: Endocrinology

## 2013-04-18 ENCOUNTER — Ambulatory Visit (INDEPENDENT_AMBULATORY_CARE_PROVIDER_SITE_OTHER): Payer: BC Managed Care – PPO | Admitting: Endocrinology

## 2013-04-18 VITALS — BP 130/74 | HR 100 | Wt 258.0 lb

## 2013-04-18 DIAGNOSIS — E049 Nontoxic goiter, unspecified: Secondary | ICD-10-CM

## 2013-04-18 DIAGNOSIS — E1065 Type 1 diabetes mellitus with hyperglycemia: Secondary | ICD-10-CM

## 2013-04-18 MED ORDER — INSULIN LISPRO 100 UNIT/ML (KWIKPEN): PEN_INJECTOR | SUBCUTANEOUS | Status: DC

## 2013-04-18 NOTE — Patient Instructions (Addendum)
good diet and exercise habits significanly improve the control of your diabetes.  please let me know if you wish to be referred to a dietician.  high blood sugar is very risky to your health.  you should see an eye doctor every year.  You are at higher than average risk for pneumonia and hepatitis-B.  You should be vaccinated against both.   controlling your blood pressure and cholesterol drastically reduces the damage diabetes does to your body.  this also applies to quitting smoking.  please discuss these with your doctor.  check your blood sugar twice a day.  vary the time of day when you check, between before the 3 meals, and at bedtime.  also check if you have symptoms of your blood sugar being too high or too low.  please keep a record of the readings and bring it to your next appointment here.  please call us sooner if your blood sugar goes below 70, or if you have a lot of readings over 200. Let's check the thyroid ultrasound.  you will receive a phone call, about a day and time for an appointment. Please stop the metformin, and:  Change your current insulin to humalog 3 times a day (just before each meal) 30-30-40 units.  (it is ok to use-up your current insulin first)  Please come back for a follow-up appointment in months.

## 2013-04-18 NOTE — Progress Notes (Signed)
Subjective:    Patient ID: Robert Cordova, male    DOB: [birth year], 62 y.o.   MRN: [id number]  HPI pt states DM was dx'ed in 2011; he has mild if any neuropathy of the lower extremities; he is unaware of any associated chronic complications.  he has been on insulin since 2012.  pt says his diet and exercise are both poor. denies hypoglycemia, but pt says cbg's vary from 100-200.  It is in general higher as the day goes on.   Past Medical History  Diagnosis Date  . ABNORMAL GLUCOSE NEC 03/18/2007  . BLEPHARITIS, LEFT 10/29/2009  . DIABETES-TYPE 2 04/18/2010  . HYPERLIPIDEMIA 10/22/2007  . HYPERTENSION 03/18/2007  . NECK PAIN, CHRONIC 03/18/2007  . NEVUS, MELANOCYTIC, FACE 10/07/2009    Past Surgical History  Procedure Laterality Date  . Colonoscopy  2009    Dr. Jarold Motto; multiple polyps  . No prior surgery      History   Social History  . Marital Status: Married    Spouse Name: N/A    Number of Children: N/A  . Years of Education: N/A   Occupational History  . Not on file.   Social History Main Topics  . Smoking status: Never Smoker   . Smokeless tobacco: Never Used  . Alcohol Use: Yes     Comment: socially per the pt  . Drug Use: No  . Sexual Activity: Not on file   Other Topics Concern  . Not on file   Social History Narrative  . No narrative on file    Current Outpatient Prescriptions on File Prior to Visit  Medication Sig Dispense Refill  . amLODipine (NORVASC) 5 MG tablet Take 1 tablet (5 mg total) by mouth daily.  90 tablet  3  . aspirin 81 MG tablet Take 81 mg by mouth daily.      [name] atorvastatin (LIPITOR) 10 MG tablet Take 1 tablet (10 mg total) by mouth daily.  90 tablet  3  . atorvastatin (LIPITOR) 10 MG tablet TAKE 1 TABLET (10 MG TOTAL) BY MOUTH DAILY.  90 tablet  2  . B-D ULTRAFINE III SHORT PEN 31G X 8 MM MISC USE AS DIRECTED  100 each  2  . fluticasone (FLONASE) 50 MCG/ACT nasal spray Place 2 sprays into the nose daily as needed.      [name] glucose  blood (FREESTYLE LITE) test strip 1 each by Other route 2 (two) times daily. Use as instructed  100 each  3  . insulin lispro protamine-lispro (HUMALOG 75/25) (75-25) 100 UNIT/ML SUSP injection 50 units in the am and 60 units in the pm      . Insulin Pen Needle 31G X 8 MM MISC 1 each by Does not apply route daily.  100 each  3  . losartan (COZAAR) 100 MG tablet One and half tab daily  150 tablet  3  . metFORMIN (GLUCOPHAGE) 1000 MG tablet Take 1 tablet (1,000 mg total) by mouth 2 (two) times daily with a meal.  200 tablet  3  . MINOXIDIL, TOPICAL, (ROGAINE EXTRA STRENGTH) 5 % SOLN Apply small amounts once daily  180 mL  6  . omeprazole (PRILOSEC) 40 MG capsule Take 1 capsule (40 mg total) by mouth daily.  90 capsule  3  . VIAGRA 50 MG tablet TAKE 1 TABLET (50 MG TOTAL) BY MOUTH AS NEEDED FOR ERECTILE DYSFUNCTION.  6 tablet  0   No current facility-administered medications on file prior to visit.  Allergies  Allergen Reactions  . Ace Inhibitors     REACTION: Coughing    Family History  Problem Relation Age of Onset  . Hyperlipidemia    . Hypertension    . Heart attack    . Diabetes    . Colon cancer Neg Hx   . Stomach cancer Neg Hx   . Esophageal cancer Neg Hx   . Rectal cancer Neg Hx    BP 130/74  Pulse 100  Wt 258 lb (117.028 kg)  BMI 36.48 kg/m2  SpO2 95%  Review of Systems denies blurry vision, headache, chest pain, sob, n/v, urinary frequency, memory loss, depression, ED sxs, and easy bruising.  He has nasal congestion, excessive diaphoresis, leg cramps, nocturia, and weight gain.    Objective:   Physical Exam VS: see vs page GEN: no distress HEAD: head: no deformity eyes: no periorbital swelling, no proptosis external nose and ears are normal mouth: no lesion seen NECK: i think i can feel the top of a goiter. CHEST WALL: no deformity LUNGS:  Clear to auscultation CV: reg rate and rhythm, no murmur ABD: abdomen is soft, nontender.  no hepatosplenomegaly.  not  distended.  no hernia.   MUSCULOSKELETAL: muscle bulk and strength are grossly normal.  no obvious joint swelling.  gait is normal and steady.   PULSES: no carotid bruit.   NEURO:  cn 2-12 grossly intact.  readily moves all 4's.   SKIN:  Normal texture and temperature.  No rash or suspicious lesion is visible.   NODES:  None palpable at the neck PSYCH: alert, oriented x3.  Does not appear anxious nor depressed.    Lab Results  Component Value Date   HGBA1C 8.3* 04/04/2013   Lab Results  Component Value Date   TSH 1.13 04/04/2013      Assessment & Plan:  DM: This insulin regimen was chosen from multiple options, as it best matches his insulin to his changing requirements throughout the day.  The benefits of glycemic control must be weighed against the risks of hypoglycemia.  He needs increased rx Weight gain: this complicates the rx of DM Goiter is suggested by physical exam.

## 2013-04-22 ENCOUNTER — Other Ambulatory Visit: Payer: Self-pay | Admitting: Family Medicine

## 2013-04-22 ENCOUNTER — Encounter: Payer: Self-pay | Admitting: Internal Medicine

## 2013-04-22 ENCOUNTER — Other Ambulatory Visit: Payer: Self-pay | Admitting: *Deleted

## 2013-04-22 ENCOUNTER — Ambulatory Visit (INDEPENDENT_AMBULATORY_CARE_PROVIDER_SITE_OTHER): Payer: BC Managed Care – PPO | Admitting: Internal Medicine

## 2013-04-22 VITALS — BP 160/90 | HR 85 | Temp 98.2°F | Resp 20 | Wt 259.0 lb

## 2013-04-22 DIAGNOSIS — Z23 Encounter for immunization: Secondary | ICD-10-CM

## 2013-04-22 DIAGNOSIS — I10 Essential (primary) hypertension: Secondary | ICD-10-CM

## 2013-04-22 DIAGNOSIS — E1065 Type 1 diabetes mellitus with hyperglycemia: Secondary | ICD-10-CM

## 2013-04-22 MED ORDER — AMLODIPINE BESYLATE 10 MG PO TABS: 10.0000 mg | ORAL_TABLET | Freq: Every day | ORAL | Status: DC

## 2013-04-22 NOTE — Patient Instructions (Addendum)
Limit your sodium (Salt) intake    It is important that you exercise regularly, at least 20 minutes 3 to 4 times per week.  If you develop chest pain or shortness of breath seek  medical attention.  You need to lose weight.  Consider a lower calorie diet and regular exercise.  Please check your blood pressure on a regular basis.  If it is consistently greater than 150/90, please make an office appointment.  Return in one month for follow-up

## 2013-04-22 NOTE — Progress Notes (Signed)
Subjective:    Patient ID: Robert Cordova, male    DOB: May 03, 1951, 62 y.o.   MRN: [id number]  HPI  62 year old patient who is seen today for followup of hypertension. He has been tracking home blood pressure readings and they have been consistently high. He was seen by endocrine a few days ago and blood pressure was normal at that visit. He has been switched to a mealtime insulin which she will began after his supply of 75/25 mixed insulin has been depleted. Otherwise he has done well without concerns or complaints  BP Readings from Last 3 Encounters:  04/22/13 160/90  04/18/13 130/74  04/10/13 150/90   Past Medical History  Diagnosis Date  . ABNORMAL GLUCOSE NEC 03/18/2007  . BLEPHARITIS, LEFT 10/29/2009  . DIABETES-TYPE 2 04/18/2010  . HYPERLIPIDEMIA 10/22/2007  . HYPERTENSION 03/18/2007  . NECK PAIN, CHRONIC 03/18/2007  . NEVUS, MELANOCYTIC, FACE 10/07/2009    History   Social History  . Marital Status: Married    Spouse Name: N/A    Number of Children: N/A  . Years of Education: N/A   Occupational History  . Not on file.   Social History Main Topics  . Smoking status: Never Smoker   . Smokeless tobacco: Never Used  . Alcohol Use: Yes     Comment: socially per the pt  . Drug Use: No  . Sexual Activity: Not on file   Other Topics Concern  . Not on file   Social History Narrative  . No narrative on file    Past Surgical History  Procedure Laterality Date  . Colonoscopy  2009    Dr. Jarold Motto; multiple polyps  . No prior surgery      Family History  Problem Relation Age of Onset  . Hyperlipidemia    . Hypertension    . Heart attack    . Diabetes    . Colon cancer Neg Hx   . Stomach cancer Neg Hx   . Esophageal cancer Neg Hx   . Rectal cancer Neg Hx     Allergies  Allergen Reactions  . Ace Inhibitors     REACTION: Coughing    Current Outpatient Prescriptions on File Prior to Visit  Medication Sig Dispense Refill  . aspirin 81 MG tablet Take 81  mg by mouth daily.      [name] atorvastatin (LIPITOR) 10 MG tablet Take 1 tablet (10 mg total) by mouth daily.  90 tablet  3  . atorvastatin (LIPITOR) 10 MG tablet TAKE 1 TABLET (10 MG TOTAL) BY MOUTH DAILY.  90 tablet  2  . B-D ULTRAFINE III SHORT PEN 31G X 8 MM MISC USE AS DIRECTED  100 each  2  . fluticasone (FLONASE) 50 MCG/ACT nasal spray Place 2 sprays into the nose daily as needed.      [name] glucose blood (FREESTYLE LITE) test strip 1 each by Other route 2 (two) times daily. Use as instructed  100 each  3  . Insulin Pen Needle 31G X 8 MM MISC 1 each by Does not apply route daily.  100 each  3  . losartan (COZAAR) 100 MG tablet One and half tab daily  150 tablet  3  . MINOXIDIL, TOPICAL, (ROGAINE EXTRA STRENGTH) 5 % SOLN Apply small amounts once daily  180 mL  6  . omeprazole (PRILOSEC) 40 MG capsule Take 1 capsule (40 mg total) by mouth daily.  90 capsule  3  . VIAGRA 50 MG tablet TAKE 1 TABLET (50  MG TOTAL) BY MOUTH AS NEEDED FOR ERECTILE DYSFUNCTION.  6 tablet  0  . insulin lispro (HUMALOG KWIKPEN) 100 UNIT/ML SOPN 3 times a day (just before each meal) 30-30-40 units, and pen needles 3/day  40 pen  3   No current facility-administered medications on file prior to visit.    BP 160/90  Pulse 85  Temp(Src) 98.2 F (36.8 C) (Oral)  Resp 20  Wt 259 lb (117.482 kg)  BMI 36.63 kg/m2  SpO2 96%    Review of Systems  Constitutional: Negative for fever, chills, appetite change and fatigue.  HENT: Negative for hearing loss, ear pain, congestion, sore throat, trouble swallowing, neck stiffness, dental problem, voice change and tinnitus.   Eyes: Negative for pain, discharge and visual disturbance.  Respiratory: Negative for cough, chest tightness, wheezing and stridor.   Cardiovascular: Negative for chest pain, palpitations and leg swelling.  Gastrointestinal: Negative for nausea, vomiting, abdominal pain, diarrhea, constipation, blood in stool and abdominal distention.  Genitourinary: Negative  for urgency, hematuria, flank pain, discharge, difficulty urinating and genital sores.  Musculoskeletal: Negative for myalgias, back pain, joint swelling, arthralgias and gait problem.  Skin: Negative for rash.  Neurological: Negative for dizziness, syncope, speech difficulty, weakness, numbness and headaches.  Hematological: Negative for adenopathy. Does not bruise/bleed easily.  Psychiatric/Behavioral: Negative for behavioral problems and dysphoric mood. The patient is not nervous/anxious.        Objective:   Physical Exam  Constitutional: He appears well-developed and well-nourished. No distress.  Blood pressure 160/90 in both arms  Psychiatric: He has a normal mood and affect. His behavior is normal.          Assessment & Plan:  Hypertension suboptimal control.  Lifestyle issues addressed. Exercise weight loss low salt diet all encouraged. We'll increase amlodipine to 10 mg daily. We'll continue a home blood pressure monitoring. We'll recheck in 4 weeks

## 2013-04-22 NOTE — Telephone Encounter (Signed)
Refills done according to chart for test strips and needles, call and ask pt what he needs tomorrow.

## 2013-04-23 ENCOUNTER — Other Ambulatory Visit: Payer: Self-pay | Admitting: *Deleted

## 2013-04-23 ENCOUNTER — Ambulatory Visit
Admission: RE | Admit: 2013-04-23 | Discharge: 2013-04-23 | Disposition: A | Discharge status: Home / Self Care | Payer: BC Managed Care – PPO | Source: Ambulatory Visit | Attending: Endocrinology | Admitting: Endocrinology

## 2013-04-23 MED ORDER — GLUCOSE BLOOD VI STRP: ORAL_STRIP | Status: DC

## 2013-04-23 NOTE — Telephone Encounter (Signed)
Patient to pick up a one touch glucometer and new Rx sent to pharmacy

## 2013-04-24 ENCOUNTER — Other Ambulatory Visit: Payer: Self-pay | Admitting: Family Medicine

## 2013-04-25 ENCOUNTER — Ambulatory Visit (INDEPENDENT_AMBULATORY_CARE_PROVIDER_SITE_OTHER): Payer: BC Managed Care – PPO | Admitting: General Surgery

## 2013-04-25 ENCOUNTER — Encounter (INDEPENDENT_AMBULATORY_CARE_PROVIDER_SITE_OTHER): Payer: Self-pay | Admitting: General Surgery

## 2013-04-25 VITALS — BP 130/66 | HR 72 | Temp 98.2°F | Resp 14 | Ht 70.0 in | Wt 260.0 lb

## 2013-04-25 DIAGNOSIS — E042 Nontoxic multinodular goiter: Secondary | ICD-10-CM

## 2013-04-25 MED ORDER — INSULIN PEN NEEDLE 31G X 8 MM MISC: Status: DC

## 2013-04-25 NOTE — Progress Notes (Signed)
Patient ID: Robert Cordova, male   DOB: 05/11/1951, 62 y.o.   MRN: [id number]  Chief Complaint  Patient presents with  . New Evaluation    eval small goiter on neck    HPI Robert Cordova is a 62 y.o. male.  This patient is referred by Dr. Everardo All for evaluation of a multinodular goiter with a dominant right-sided nodules. He is followed by his endocrinologist for diabetes and is insulin-dependent. He was noted to have a thyroid goiter on the routine exam and was subsequently sent for a thyroid ultrasound which demonstrated a multinodular goiter with dominant right-sided nodules. After further history he denies any swallowing problems or voice changes. He denies any painful swelling or weight loss. He has gained some weight which he treats to his diabetes. He denies any palpitations. He denies any radiation exposure or family history of pituitary or pancreatic or thyroid problems. His only complaint is some dry throat at night and has a sore throat at night if he does not use or lozenges HPI  Past Medical History  Diagnosis Date  . ABNORMAL GLUCOSE NEC 03/18/2007  . BLEPHARITIS, LEFT 10/29/2009  . DIABETES-TYPE 2 04/18/2010  . HYPERLIPIDEMIA 10/22/2007  . HYPERTENSION 03/18/2007  . NECK PAIN, CHRONIC 03/18/2007  . NEVUS, MELANOCYTIC, FACE 10/07/2009    Past Surgical History  Procedure Laterality Date  . Colonoscopy  2009    Dr. Jarold Motto; multiple polyps  . No prior surgery      Family History  Problem Relation Age of Onset  . Hyperlipidemia    . Hypertension    . Heart attack    . Diabetes    . Colon cancer Neg Hx   . Stomach cancer Neg Hx   . Esophageal cancer Neg Hx   . Rectal cancer Neg Hx     Social History History  Substance Use Topics  . Smoking status: Never Smoker   . Smokeless tobacco: Never Used  . Alcohol Use: Yes     Comment: socially per the pt    Allergies  Allergen Reactions  . Ace Inhibitors     REACTION: Coughing    Current Outpatient  Prescriptions  Medication Sig Dispense Refill  . amLODipine (NORVASC) 10 MG tablet Take 1 tablet (10 mg total) by mouth daily.  90 tablet  3  . aspirin 81 MG tablet Take 81 mg by mouth daily.      [name] atorvastatin (LIPITOR) 10 MG tablet Take 1 tablet (10 mg total) by mouth daily.  90 tablet  3  . glucose blood (ONETOUCH VERIO) test strip Test once daily or up to 4 times as directed, dx 250.01  400 each  12  . HUMALOG MIX 75/25 KWIKPEN (75-25) 100 UNIT/ML SUPN Use per sliding scale, 30-50 units breakfast and lunch, 60 units at bedtime      . insulin lispro (HUMALOG KWIKPEN) 100 UNIT/ML SOPN 3 times a day (just before each meal) 30-30-40 units, and pen needles 3/day  40 pen  3  . Insulin Pen Needle (B-D ULTRAFINE III SHORT PEN) 31G X 8 MM MISC USE AS DIRECTED  100 each  5  . Insulin Pen Needle 31G X 8 MM MISC 1 each by Does not apply route daily.  100 each  3  . losartan (COZAAR) 100 MG tablet One and half tab daily  150 tablet  3  . MINOXIDIL, TOPICAL, (ROGAINE EXTRA STRENGTH) 5 % SOLN Apply small amounts once daily  180 mL  6  . omeprazole (PRILOSEC)  40 MG capsule Take 1 capsule (40 mg total) by mouth daily.  90 capsule  3   No current facility-administered medications for this visit.    Review of Systems Review of Systems All other review of systems negative or noncontributory except as stated in the HPI  Blood pressure 130/66, pulse 72, temperature 98.2 F (36.8 C), temperature source Temporal, resp. rate 14, height 5\' 10"  (1.778 m), weight 260 lb (117.935 kg).  Physical Exam Physical Exam Physical Exam  Vitals reviewed. Constitutional: He is oriented to person, place, and time. He appears well-developed and well-nourished. No distress.  HENT:  Head: Normocephalic and atraumatic.  Mouth/Throat: No oropharyngeal exudate.  Eyes: Conjunctivae and EOM are normal. Pupils are equal, round, and reactive to light. Right eye exhibits no discharge. Left eye exhibits no discharge. No scleral  icterus.  Neck: Normal range of motion. No tracheal deviation present. thyroid does feel enlarged but I do not appreciate any nodules or LAD Cardiovascular: Normal rate, regular rhythm and normal heart sounds.   Pulmonary/Chest: Effort normal and breath sounds normal. No stridor. No respiratory distress. He has no wheezes. He has no rales. He exhibits no tenderness.  Abdominal: Soft. Bowel sounds are normal. He exhibits no distension and no mass. There is no tenderness. There is no rebound and no guarding.  Musculoskeletal: Normal range of motion. He exhibits no edema and no tenderness.  Neurological: He is alert and oriented to person, place, and time.  Skin: Skin is warm and dry. No rash noted. He is not diaphoretic. No erythema. No pallor.  Psychiatric: He has a normal mood and affect. His behavior is normal. Judgment and thought content normal.    Data Reviewed US thyroid  Assessment    Multinodular goiter with dominant right-sided nodules I think that this is most likely a multinodular goiter which is nontoxic. His most recent TSH was normal. He is asymptomatic from this. However given the complex nodule on the right hand dominant nodules on the right I have recommended FNA of the dominant right-sided nodules for further evaluation. I. Discussed with him the pros and cons of this and the possible outcomes of the biopsies including benign, malignant, or indeterminate pathology. We discussed the game plan going forward for each of these possible outcomes. And I will see him back after his biopsy to discuss the next step in his care.    Plan    Thyroid FNA Followup in 1-2 weeks after his biopsy to discuss further management        Lodema Pilot DAVID 04/25/2013, 11:52 AM

## 2013-04-25 NOTE — Telephone Encounter (Signed)
Spoke to pt told him Rx for test strips was sent and looks like there is refills on needles but I will resend another Rx for needles. Pt verbalized understanding.

## 2013-04-28 ENCOUNTER — Other Ambulatory Visit (INDEPENDENT_AMBULATORY_CARE_PROVIDER_SITE_OTHER): Payer: Self-pay | Admitting: General Surgery

## 2013-04-28 ENCOUNTER — Telehealth (INDEPENDENT_AMBULATORY_CARE_PROVIDER_SITE_OTHER): Payer: Self-pay

## 2013-04-28 DIAGNOSIS — E042 Nontoxic multinodular goiter: Secondary | ICD-10-CM

## 2013-04-28 NOTE — Telephone Encounter (Signed)
Patient called in stating he has not received a call for his biopsy to be scheduled. Advised I saw the order in the computer but that is hasn't been scheduled. Advised I would print it out and once our coordinator is back in she will call and give him the appt.

## 2013-04-29 ENCOUNTER — Ambulatory Visit
Admission: RE | Admit: 2013-04-29 | Discharge: 2013-04-29 | Disposition: A | Discharge status: Home / Self Care | Payer: BC Managed Care – PPO | Source: Ambulatory Visit | Attending: General Surgery | Admitting: General Surgery

## 2013-04-29 ENCOUNTER — Other Ambulatory Visit (HOSPITAL_COMMUNITY)
Admission: RE | Admit: 2013-04-29 | Discharge: 2013-04-29 | Disposition: A | Discharge status: Home / Self Care | Payer: BC Managed Care – PPO | Source: Ambulatory Visit | Attending: Interventional Radiology | Admitting: Interventional Radiology

## 2013-04-29 DIAGNOSIS — E041 Nontoxic single thyroid nodule: Secondary | ICD-10-CM | POA: Insufficient documentation

## 2013-04-29 DIAGNOSIS — E042 Nontoxic multinodular goiter: Secondary | ICD-10-CM

## 2013-05-02 ENCOUNTER — Other Ambulatory Visit: Payer: Self-pay | Admitting: *Deleted

## 2013-05-02 NOTE — Telephone Encounter (Signed)
Open error 

## 2013-05-09 ENCOUNTER — Encounter (INDEPENDENT_AMBULATORY_CARE_PROVIDER_SITE_OTHER): Payer: Self-pay | Admitting: General Surgery

## 2013-05-09 ENCOUNTER — Ambulatory Visit (INDEPENDENT_AMBULATORY_CARE_PROVIDER_SITE_OTHER): Payer: BC Managed Care – PPO | Admitting: General Surgery

## 2013-05-09 VITALS — BP 124/80 | HR 68 | Temp 98.0°F | Resp 14 | Ht 70.0 in | Wt 260.6 lb

## 2013-05-09 DIAGNOSIS — E042 Nontoxic multinodular goiter: Secondary | ICD-10-CM

## 2013-05-09 NOTE — Progress Notes (Signed)
Subjective:     Patient ID: Robert Cordova, male   DOB: 29-Nov-1950, 62 y.o.   MRN: [id number]  HPI  This patient follows up status post FNA of his right thyroid nodule. He is doing well and remained asymptomatic from his thyroid goiter and this thyroid nodule. His pathology was consistent with a benign nonneoplastic goiter. His thyroid function is normal. He is also contemplating weight loss surgery  Review of Systems     Objective:   Physical Exam No evidence of hematoma and no palpable mass in his neck     Assessment:     Nontoxic multi-nodular goiter  his FNA was consistent with a nonneoplastic goiter. He remains asymptomatic from this. We discussed the options of excisional biopsy of versus six-month followup ultrasound and given his benign FNA, I have recommended six-month followup ultrasound to ensure stability of this nodule. We also discussed the options for weight loss surgery since he has already been investigating this. We discussed the options of lap band, sleeve gastrectomy, and Roux-en-Y gastric bypass and he is here to schedule a consultation with the surgeon. I think that he would benefit from weight loss surgery given his high blood pressure, hyperlipidemia, diabetes mellitus and likely sleep apnea    Plan:     We will plan for a 6 month followup ultrasound to ensure stability of this nodule and he will followup in April for this. He knows that I will not be here in April and his followup will be with another surgeon. He will also continue to pursue weight loss surgery which I think he would benefit from.

## 2013-05-22 ENCOUNTER — Ambulatory Visit: Payer: BC Managed Care – PPO | Admitting: Family Medicine

## 2013-05-26 ENCOUNTER — Encounter: Payer: Self-pay | Admitting: Family Medicine

## 2013-05-26 ENCOUNTER — Ambulatory Visit (INDEPENDENT_AMBULATORY_CARE_PROVIDER_SITE_OTHER): Payer: BC Managed Care – PPO | Admitting: Family Medicine

## 2013-05-26 VITALS — BP 140/90 | Temp 98.7°F | Wt 264.0 lb

## 2013-05-26 DIAGNOSIS — E1065 Type 1 diabetes mellitus with hyperglycemia: Secondary | ICD-10-CM

## 2013-05-26 DIAGNOSIS — E669 Obesity, unspecified: Secondary | ICD-10-CM

## 2013-05-26 DIAGNOSIS — E049 Nontoxic goiter, unspecified: Secondary | ICD-10-CM

## 2013-05-26 DIAGNOSIS — I10 Essential (primary) hypertension: Secondary | ICD-10-CM

## 2013-05-26 NOTE — Patient Instructions (Signed)
Be sure to take the insulin before each meal  You can followup with Dr. Revonda Standard one or 2 more times to get your blood sugar under better control  Followup with me in a month on your blood pressure  We will get you set up for a pulmonary consult to evaluate possible sleep apnea condition

## 2013-05-26 NOTE — Progress Notes (Signed)
  Subjective:    Patient ID: Robert Cordova, male    DOB: 04-17-1951, 62 y.o.   MRN: [id number]  HPI He is a 62 year old married male nonsmoker who comes in today for followup  We referred him to endocrinology because his blood sugar had risen to 172 and his A1c had gone to 8.3 from 7.1%. He saw Dr. Revonda Standard had a complete evaluation. He's currently on short acting insulin. He says he taken 30 units before breakfast 30 before lunch and 50 units at bedtime. He was instructed take his evening dose before his evening meal so does not get nighttime hypoglycemia  He also had a biopsy of a thyroid lesion fortunately was benign. Thyroid function is normal.  He's also has a very dry mouth at night and he has sensation of choking. He may have an underlying sleep apnea.  Also Dr. Kirtland Bouchard. increase his Norvasc to 10 mg a day to his blood pressure was elevated BP today 140/90  Discuss goal 130/80 had recently been on the Norvasc for couple weeks. Wet skin without a few more weeks plus sometimes when your blood sugar drops your blood pressure will also drop.   Review of Systems    review of systems negative no hypoglycemia Objective:   Physical Exam  Well-developed well-nourished male in no acute distress examination of the neck shows symmetrical slightly nodular bilaterally. Legs look normal except for some fungal infection left great toenail. Skin normal pulses normal no neuropathy  Oral cavity normal slightly enlarged neck because of weight      Assessment & Plan:  Diabetes type 1 would change in insulin followup by Dr. Revonda Standard  Thyroid nodule benign followed by general surgery  Question sleep apnea consult with pulmonary

## 2013-05-28 ENCOUNTER — Institutional Professional Consult (permissible substitution): Payer: BC Managed Care – PPO | Admitting: Pulmonary Disease

## 2013-06-10 ENCOUNTER — Telehealth: Payer: Self-pay | Admitting: Family Medicine

## 2013-06-10 ENCOUNTER — Ambulatory Visit: Payer: BC Managed Care – PPO | Admitting: Family Medicine

## 2013-06-10 NOTE — Telephone Encounter (Signed)
Patient Information:  Caller Name: Parke  Phone: [phone]  Patient: Robert Cordova  Gender: Male  DOB: [birth year]  Age: 62 Years  PCP: [facility]  Office Follow Up:  Does the office need to follow up with this patient?: No  Instructions For The Office: N/A  RN Note:  States noted his feet swelled a great deal during a flight to [place] and back, and swelling is resolved since being home.  States recently changed from humalog 75/25 to 100% humalog.  States also increased his losartan and norvasc a couple of months ago.  Per leg swelling protocol, emergent symptoms denied; advised and offered appt within 3 days in office.  States swelling has resolved, and has appt with Dr. Tawanna Cooler 06/16/13 and will discuss at that time.  krs/can  Symptoms  Reason For Call & Symptoms: ankle/foot swelling changes in insulin  Reviewed Health History In EMR: Yes  Reviewed Medications In EMR: Yes  Reviewed Allergies In EMR: Yes  Reviewed Surgeries / Procedures: Yes  Date of Onset of Symptoms: 05/27/2013  Guideline(s) Used:  Leg Swelling and Edema  Disposition Per Guideline:   See Within 3 Days in Office  Reason For Disposition Reached:   Mild swelling of both ankles (i.e., pedal edema) AND new onset or worsening  Advice Given:  N/A  Patient Refused Recommendation:  Patient Will Follow Up With Office Later  has appt 06/16/13 and prefers to wait to discuss till then krs/can

## 2013-06-13 ENCOUNTER — Encounter (INDEPENDENT_AMBULATORY_CARE_PROVIDER_SITE_OTHER): Payer: Self-pay | Admitting: Surgery

## 2013-06-13 ENCOUNTER — Other Ambulatory Visit (INDEPENDENT_AMBULATORY_CARE_PROVIDER_SITE_OTHER): Payer: Self-pay | Admitting: Surgery

## 2013-06-13 ENCOUNTER — Other Ambulatory Visit (INDEPENDENT_AMBULATORY_CARE_PROVIDER_SITE_OTHER): Payer: Self-pay

## 2013-06-13 ENCOUNTER — Ambulatory Visit (INDEPENDENT_AMBULATORY_CARE_PROVIDER_SITE_OTHER): Payer: BC Managed Care – PPO | Admitting: Surgery

## 2013-06-13 VITALS — BP 130/80 | HR 88 | Temp 97.4°F | Resp 18 | Ht 70.0 in | Wt 257.6 lb

## 2013-06-13 DIAGNOSIS — E119 Type 2 diabetes mellitus without complications: Secondary | ICD-10-CM

## 2013-06-13 NOTE — Patient Instructions (Signed)

## 2013-06-13 NOTE — Progress Notes (Signed)
Chief Complaint:  Morbid obesity BMI 36 with multiple comorbidities   History of Present Illness:  Robert Cordova is an 62 y.o. male who has been to one of our seminars.  He was initially injected in the lap band. When he saw Lodema Pilot regarding his thyroid he had discussed sleeve gastrectomy with him as well. We discussed both operations and in terms of risk and benefits. For more aggressive operation I would probably recommend a Roux-en-Y gastric bypass I think that's beyond the comfort level he has for surgery. I think the lap band to be good to old help him lose and hopefully allow his diabetes to go into remission.  Past Medical History  Diagnosis Date  . ABNORMAL GLUCOSE NEC 03/18/2007  . BLEPHARITIS, LEFT 10/29/2009  . DIABETES-TYPE 2 04/18/2010  . HYPERLIPIDEMIA 10/22/2007  . HYPERTENSION 03/18/2007  . NECK PAIN, CHRONIC 03/18/2007  . NEVUS, MELANOCYTIC, FACE 10/07/2009    Past Surgical History  Procedure Laterality Date  . Colonoscopy  2009    Dr. Jarold Motto; multiple polyps  . No prior surgery      Current Outpatient Prescriptions  Medication Sig Dispense Refill  . amLODipine (NORVASC) 10 MG tablet Take 1 tablet (10 mg total) by mouth daily.  90 tablet  3  . aspirin 81 MG tablet Take 81 mg by mouth daily.      [name] atorvastatin (LIPITOR) 10 MG tablet Take 1 tablet (10 mg total) by mouth daily.  90 tablet  3  . fluticasone (FLONASE) 50 MCG/ACT nasal spray       . glucose blood (ONETOUCH VERIO) test strip Test once daily or up to 4 times as directed, dx 250.01  400 each  12  . HUMALOG MIX 75/25 KWIKPEN (75-25) 100 UNIT/ML SUPN       . insulin lispro (HUMALOG KWIKPEN) 100 UNIT/ML SOPN 3 times a day (just before each meal) 30-30-40 units, and pen needles 3/day  40 pen  3  . Insulin Pen Needle (B-D ULTRAFINE III SHORT PEN) 31G X 8 MM MISC USE AS DIRECTED  100 each  5  . Insulin Pen Needle 31G X 8 MM MISC 1 each by Does not apply route daily.  100 each  3  . losartan (COZAAR) 100 MG  tablet One and half tab daily  150 tablet  3  . MINOXIDIL, TOPICAL, (ROGAINE EXTRA STRENGTH) 5 % SOLN Apply small amounts once daily  180 mL  6  . omeprazole (PRILOSEC) 40 MG capsule Take 1 capsule (40 mg total) by mouth daily.  90 capsule  3   No current facility-administered medications for this visit.   Ace inhibitors Family History  Problem Relation Age of Onset  . Hyperlipidemia    . Hypertension    . Heart attack    . Diabetes    . Colon cancer Neg Hx   . Stomach cancer Neg Hx   . Esophageal cancer Neg Hx   . Rectal cancer Neg Hx    Social History:   reports that he has never smoked. He has never used smokeless tobacco. He reports that he drinks alcohol. He reports that he does not use illicit drugs.   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 rate 18, height 5\' 10"  (1.778 m), weight 257 lb 9.6 oz (116.847 kg). Body mass  index is 36.96 kg/(m^2).  Gen:  WDWN WM NAD  Neurological: Alert and oriented to person, place, and time. Motor and sensory function is grossly intact  Head: Normocephalic and atraumatic.  Eyes: Conjunctivae are normal. Pupils are equal, round, and reactive to light. No scleral icterus.  Neck: Normal range of motion. Neck supple. No tracheal deviation or thyromegaly present.  Cardiovascular:  SR without murmurs or gallops.  No carotid bruits Respiratory: Effort normal.  No respiratory distress. No chest wall tenderness. Breath sounds normal.  No wheezes, rales or rhonchi.  Abdomen:  Nontender; obese GU: Musculoskeletal: Normal range of motion. Extremities are nontender. No cyanosis, edema or clubbing noted Lymphadenopathy: No cervical, preauricular, postauricular or axillary adenopathy is present Skin: Skin is warm and dry. No rash noted. No diaphoresis. No erythema.  No pallor. Pscyh: Normal mood and affect. Behavior is normal. Judgment and thought content normal.   LABORATORY RESULTS: No results found for this or any previous visit (from the past 48 hour(s)).  RADIOLOGY RESULTS: No results found.  Problem List: Patient Active Problem List   Diagnosis Date Noted  . Goiter 04/18/2013  . Obesity, unspecified 04/10/2013  . Osteoarthritis 07/26/2011  . Plantar fasciitis 07/26/2011  . Diabetes mellitus type 1, uncontrolled, insulin dependent 07/26/2011  . BLEPHARITIS, LEFT 10/29/2009  . NEVUS, MELANOCYTIC, FACE 10/07/2009  . HYPERLIPIDEMIA 10/22/2007  . HEMATURIA UNSPECIFIED 08/09/2007  . HYPERTENSION 03/18/2007  . NECK PAIN, CHRONIC 03/18/2007    Assessment & Plan: Morbid obesity with DM;  Workup toward laparoscopic adjustable gastric banding    Matt B. Daphine Deutscher, MD, [facility] [phone] beeper [phone]  06/13/2013 11:43 AM

## 2013-06-16 ENCOUNTER — Ambulatory Visit (INDEPENDENT_AMBULATORY_CARE_PROVIDER_SITE_OTHER): Payer: BC Managed Care – PPO | Admitting: Family Medicine

## 2013-06-16 ENCOUNTER — Encounter: Payer: Self-pay | Admitting: Family Medicine

## 2013-06-16 VITALS — BP 130/80 | Temp 99.0°F | Wt 258.0 lb

## 2013-06-16 DIAGNOSIS — E119 Type 2 diabetes mellitus without complications: Secondary | ICD-10-CM

## 2013-06-16 DIAGNOSIS — E1065 Type 1 diabetes mellitus with hyperglycemia: Secondary | ICD-10-CM

## 2013-06-16 DIAGNOSIS — I10 Essential (primary) hypertension: Secondary | ICD-10-CM

## 2013-06-16 DIAGNOSIS — R05 Cough: Secondary | ICD-10-CM

## 2013-06-16 DIAGNOSIS — E049 Nontoxic goiter, unspecified: Secondary | ICD-10-CM

## 2013-06-16 DIAGNOSIS — E669 Obesity, unspecified: Secondary | ICD-10-CM

## 2013-06-16 MED ORDER — INSULIN GLARGINE 100 UNIT/ML SOLOSTAR PEN: PEN_INJECTOR | SUBCUTANEOUS | Status: DC

## 2013-06-16 MED ORDER — HYDROCHLOROTHIAZIDE 25 MG PO TABS: 25.0000 mg | ORAL_TABLET | Freq: Every day | ORAL | Status: DC

## 2013-06-16 NOTE — Progress Notes (Signed)
  Subjective:    Patient ID: Robert Cordova, male    DOB: 1951-04-14, 62 y.o.   MRN: [id number]  HPI [name] is a 62 year old married male nonsmoker who comes in today for evaluation of diabetes type 1, obesity considering bariatric surgery, bilateral peripheral edema, a cough, and to discuss a recent thyroid biopsy.  He is currently on a short acting insulin before each meal. He says when he took a before meal it would make him hypoglycemic therefore has been taking it after heating. He's been taken off his long-acting insulin by Dr. Gregary Signs. His blood sugars are in the 180-190 range.  He met with Dr. Wenda Low to discuss bariatric lap band surgery. He's considering it  He's on Norvasc 10 mg daily and losartan 150 mg daily BP 130/80 his digital blood pressure cuff is reading 150/90. Advised him to get a new cuff.  Because the edema we'll decrease the Norvasc to 5 mg a day add a diuretic to take when necessary  He had a thyroid biopsy was benign  Since he got back from [place] had a cough. He's had no fever nausea vomiting diarrhea et Karie Soda. He's been using over-the-counter steroid nasal spray to no avail    Review of Systems Review of systems otherwise negative    Objective:   Physical Exam  Well-developed well-nourished male no acute distress vital signs stable he's afebrile BP 130/80 ENT exam negative neck negative except for the slight thyroid nodule chest was clear to auscultation  2+ peripheral edema    Assessment & Plan:  Insulin not at goal add long-acting basal insulin Lantus 10 units continue sliding scale but take before eating  Peripheral edema........... decrease Norvasc to 5 mg daily BP check daily with new cuff diuretic when necessary  Obesity consider bariatric surgery  Cough probably secondary to allergic rhinitis continue steroid nasal spray at plain Zyrtec at bedtime

## 2013-06-16 NOTE — Patient Instructions (Signed)
Decrease the Norvasc to 5 mg daily  Hydrochlorothiazide.......[name] 1 tablet daily when necessary for fluid retention  BP checked daily with a new blood pressure cuff..............omron  Return in one week for followup  Add 10 units of long-acting insulin Lantus at bedtime  Continue the sliding scale but take it right before you eat  At plain Zyrtec 10 mg at bedtime for the cough and postnasal drip  Continue steroid nasal spray one shot up each nostril at bedtime

## 2013-06-25 ENCOUNTER — Encounter: Payer: Self-pay | Admitting: Family Medicine

## 2013-06-25 ENCOUNTER — Ambulatory Visit (INDEPENDENT_AMBULATORY_CARE_PROVIDER_SITE_OTHER): Payer: BC Managed Care – PPO | Admitting: Family Medicine

## 2013-06-25 VITALS — BP 140/80 | Temp 98.2°F | Wt 259.0 lb

## 2013-06-25 DIAGNOSIS — E669 Obesity, unspecified: Secondary | ICD-10-CM

## 2013-06-25 DIAGNOSIS — E1065 Type 1 diabetes mellitus with hyperglycemia: Secondary | ICD-10-CM

## 2013-06-25 MED ORDER — INSULIN ASPART 100 UNIT/ML ~~LOC~~ SOLN: SUBCUTANEOUS | Status: DC

## 2013-06-25 NOTE — Progress Notes (Signed)
Pre visit review using our clinic review tool, if applicable. No additional management support is needed unless otherwise documented below in the visit note. 

## 2013-06-25 NOTE — Progress Notes (Signed)
  Subjective:    Patient ID: Robert Cordova, male    DOB: [birth year], 62 y.o.   MRN: [id number]  HPI Robert Cordova is a 62 year old married male nonsmoker who comes in today for followup of diabetes type 1  He's currently taking a sliding scale of insulin before each meal,,,,, 30 units before breakfast,,,,,,,,,,,,,, 30 before noon,,,,,,,,,, and 40 before his evening meal........ and Lantus 10 units at bedtime. Blood sugars in the 180-190 range. No hypoglycemia  His weight is 259 pounds he is considering bariatric consult which I would highly recommend,,,,   Review of Systems     review of systems otherwise negative  Objective:   Physical Exam   well-developed well-nourished male no acute distress vital signs stable he is afebrile      Assessment & PlaDiabetes type 1 not at goal increase Lantus by 5 units every week until sugar drops to 120 range  Continue sliding scale before each meal followup in one month diabetes type 1 diabetes type 1n:

## 2013-06-25 NOTE — Patient Instructions (Signed)
Increase the Lantus by 5 units every week and to your fasting blood sugar drops down to 120 range  Check a fasting blood sugar once in the morning  Followup in 3 weeks

## 2013-06-27 ENCOUNTER — Encounter: Payer: Self-pay | Admitting: *Deleted

## 2013-07-08 ENCOUNTER — Encounter: Payer: Self-pay | Admitting: Pulmonary Disease

## 2013-07-08 ENCOUNTER — Ambulatory Visit (INDEPENDENT_AMBULATORY_CARE_PROVIDER_SITE_OTHER): Payer: BC Managed Care – PPO | Admitting: Pulmonary Disease

## 2013-07-08 VITALS — BP 122/80 | HR 71 | Temp 98.0°F | Ht 70.0 in | Wt 262.2 lb

## 2013-07-08 DIAGNOSIS — G4733 Obstructive sleep apnea (adult) (pediatric): Secondary | ICD-10-CM | POA: Insufficient documentation

## 2013-07-08 NOTE — Assessment & Plan Note (Signed)
The patient's history is very suggestive of clinically significant sleep apnea. I have had a long discussion with him about the pathophysiology of sleep apnea, including its impact to his quality of life and cardiovascular health. He apparently is scheduled for a sleep study next week, and I will arrange followup once the results are available.

## 2013-07-08 NOTE — Patient Instructions (Signed)
Will review your sleep study next week, and arrange followup to review.

## 2013-07-08 NOTE — Progress Notes (Signed)
Subjective:    Patient ID: Robert Cordova, male    DOB: 01-Dec-1950, 62 y.o.   MRN: [id number]  HPI The patient is a 62 year old male who I have been asked to see for possible obstructive sleep apnea. The patient is scheduled for upcoming bariatric surgery, and will need to preoperative assessment. He also has a history of hypertension and diabetes, and the question has been raised whether this is contributing. The patient does have along history of snoring, along with snoring arousals. No one has ever commented on an abnormal breathing pattern during sleep. He has frequent awakenings at night, and is not rested in the mornings upon arising. He notes definite inappropriate daytime sleepiness with periods of inactivity, and will fall asleep in the evenings watching television or movies. He also can get sleepy driving longer distances. The patient states his weight is up over 15 pounds the last 2 years, and his Epworth score today is 18.   Sleep Questionnaire What time do you typically go to bed?( Between what hours) 10p-11p 10p-11p at 1416 on 07/08/13 by Maisie Fus, CMA How long does it take you to fall asleep? within minutes within minutes at 1416 on 07/08/13 by Maisie Fus, CMA How many times during the night do you wake up? 3 3 at 1416 on 07/08/13 by Maisie Fus, CMA What time do you get out of bed to start your day? No Value 6-7a at 1416 on 07/08/13 by Maisie Fus, CMA Do you drive or operate heavy machinery in your occupation? No No at 1416 on 07/08/13 by Maisie Fus, CMA How much has your weight changed (up or down) over the past two years? (In pounds) 15 lb (6.804 kg)15 lb (6.804 kg) increase at 1416 on 07/08/13 by Maisie Fus, CMA Have you ever had a sleep study before? No No at 1416 on 07/08/13 by Maisie Fus, CMA Do you currently use CPAP? No No at 1416 on 07/08/13 by Maisie Fus, CMA Do you wear oxygen at any time? No No at 1416 on  07/08/13 by Maisie Fus, CMA   Review of Systems  Constitutional: Positive for unexpected weight change. Negative for fever.  HENT: Positive for congestion. Negative for dental problem, ear pain, nosebleeds, postnasal drip, rhinorrhea, sinus pressure, sneezing, sore throat and trouble swallowing.   Eyes: Negative for redness and itching.  Respiratory: Positive for cough and shortness of breath. Negative for chest tightness and wheezing.   Cardiovascular: Negative for palpitations and leg swelling ( feet swelling).  Gastrointestinal: Negative for nausea and vomiting.       Acid heartburn//indigestion  Genitourinary: Negative for dysuria.  Musculoskeletal: Positive for arthralgias and joint swelling.  Skin: Positive for rash ( lower back-- "comes and goes").  Neurological: Negative for headaches.  Hematological: Does not bruise/bleed easily.  Psychiatric/Behavioral: Negative for dysphoric mood. The patient is not nervous/anxious.        Objective:   Physical Exam Constitutional:  Overweight male, no acute distress  HENT:  Nares patent without discharge, but deviated septum to right with narrowing.  Oropharynx without exudate, palate and uvula are moderately elongated.   Eyes:  Perrla, eomi, no scleral icterus  Neck:  No JVD, no TMG  Cardiovascular:  Normal rate, regular rhythm, no rubs or gallops.  No murmurs        Intact distal pulses  Pulmonary :  Normal breath sounds, no stridor or respiratory distress   No rales, rhonchi, or wheezing  Abdominal:  Soft, nondistended, bowel sounds present.  No tenderness noted.   Musculoskeletal:  minimal lower extremity edema noted.  Lymph Nodes:  No cervical lymphadenopathy noted  Skin:  No cyanosis noted  Neurologic:  Alert, appropriate, moves all 4 extremities without obvious deficit.         Assessment & Plan:

## 2013-07-09 ENCOUNTER — Ambulatory Visit (HOSPITAL_COMMUNITY)
Admission: RE | Admit: 2013-07-09 | Discharge: 2013-07-09 | Disposition: A | Discharge status: Home / Self Care | Payer: BC Managed Care – PPO | Source: Ambulatory Visit | Attending: Surgery | Admitting: Surgery

## 2013-07-09 ENCOUNTER — Other Ambulatory Visit: Payer: Self-pay

## 2013-07-09 ENCOUNTER — Other Ambulatory Visit (HOSPITAL_COMMUNITY): Payer: BC Managed Care – PPO

## 2013-07-09 DIAGNOSIS — Z6836 Body mass index (BMI) 36.0-36.9, adult: Secondary | ICD-10-CM | POA: Insufficient documentation

## 2013-07-09 DIAGNOSIS — E049 Nontoxic goiter, unspecified: Secondary | ICD-10-CM | POA: Insufficient documentation

## 2013-07-09 DIAGNOSIS — M722 Plantar fascial fibromatosis: Secondary | ICD-10-CM | POA: Insufficient documentation

## 2013-07-09 DIAGNOSIS — H01009 Unspecified blepharitis unspecified eye, unspecified eyelid: Secondary | ICD-10-CM | POA: Insufficient documentation

## 2013-07-09 DIAGNOSIS — E109 Type 1 diabetes mellitus without complications: Secondary | ICD-10-CM | POA: Insufficient documentation

## 2013-07-09 DIAGNOSIS — I10 Essential (primary) hypertension: Secondary | ICD-10-CM | POA: Insufficient documentation

## 2013-07-09 DIAGNOSIS — M1990 Unspecified osteoarthritis, unspecified site: Secondary | ICD-10-CM | POA: Insufficient documentation

## 2013-07-16 ENCOUNTER — Ambulatory Visit (INDEPENDENT_AMBULATORY_CARE_PROVIDER_SITE_OTHER): Payer: BC Managed Care – PPO | Admitting: Family Medicine

## 2013-07-16 ENCOUNTER — Encounter: Payer: Self-pay | Admitting: Family Medicine

## 2013-07-16 DIAGNOSIS — I10 Essential (primary) hypertension: Secondary | ICD-10-CM

## 2013-07-16 DIAGNOSIS — E1065 Type 1 diabetes mellitus with hyperglycemia: Secondary | ICD-10-CM

## 2013-07-16 MED ORDER — METFORMIN HCL 500 MG PO TABS: 500.0000 mg | ORAL_TABLET | Freq: Two times a day (BID) | ORAL | Status: DC

## 2013-07-16 NOTE — Progress Notes (Signed)
   Subjective:    Patient ID: Robert Cordova, male    DOB: Nov 17, 1950, 62 y.o.   MRN: [id number]  HPI Robert Cordova is a 62 year old married male nonsmoker who comes in today for followup of hypertension and diabetes  He's taken Norvasc 10 mg daily, Cozaar 100 mg daily BP 150/80. We gave him a diuretic but is only taking it every now and then.  We've gradually increased his insulin because his blood sugar was markedly elevated. He's now taking 30 3040 before each meal and 30 units of long-acting an insulin at bedtime. Fasting blood sugar in the 160 range  He's not taking metformin because of each lot of dairy products when he states in the metformin it causes GI upset however on the metformin he got better blood sugar control   Review of Systems Review of systems negative    Objective:   Physical Exam Well-developed well-nourished male in no acute distress vital signs stable he is afebrile BP 150/80       Assessment & Plan:  Hypertension not at goal add diuretic and take a daily  Diabetes type 1 not at goal increase Lantus by 5 units per week restart metformin

## 2013-07-16 NOTE — Patient Instructions (Addendum)
Restart the metformin,,,,,,,, 500 mg twice daily  Continue the insulin before each meal  Increase the long-acting insulin by 5 units per week and 2 your fasting blood sugar drops to 120  Return in one month for followup  Take the diuretic daily

## 2013-07-17 ENCOUNTER — Ambulatory Visit (HOSPITAL_BASED_OUTPATIENT_CLINIC_OR_DEPARTMENT_OTHER): Payer: BC Managed Care – PPO | Attending: Surgery

## 2013-07-17 VITALS — Ht 70.0 in | Wt 255.0 lb

## 2013-07-17 DIAGNOSIS — G4710 Hypersomnia, unspecified: Secondary | ICD-10-CM | POA: Insufficient documentation

## 2013-07-17 DIAGNOSIS — R0609 Other forms of dyspnea: Secondary | ICD-10-CM | POA: Insufficient documentation

## 2013-07-17 DIAGNOSIS — G4733 Obstructive sleep apnea (adult) (pediatric): Secondary | ICD-10-CM

## 2013-07-17 DIAGNOSIS — R0989 Other specified symptoms and signs involving the circulatory and respiratory systems: Secondary | ICD-10-CM | POA: Insufficient documentation

## 2013-07-18 ENCOUNTER — Ambulatory Visit: Payer: BC Managed Care – PPO | Admitting: Endocrinology

## 2013-07-26 DIAGNOSIS — G4733 Obstructive sleep apnea (adult) (pediatric): Secondary | ICD-10-CM

## 2013-07-26 NOTE — Sleep Study (Signed)
   NAME: Robert Cordova DATE OF BIRTH:  1951/04/10 MEDICAL RECORD NUMBER [id number]  LOCATION: [facility]  PHYSICIAN: YOUNG,CLINTON D  DATE OF STUDY: 07/17/2013  SLEEP STUDY TYPE: Nocturnal Polysomnogram               REFERRING PHYSICIAN: Valarie Merino, MD  INDICATION FOR STUDY: Hypersomnia with sleep apnea  EPWORTH SLEEPINESS SCORE:   16/24 HEIGHT: 5\' 10"  (177.8 cm)  WEIGHT: 255 lb (115.667 kg)    Body mass index is 36.59 kg/(m^2).  NECK SIZE: 17 in.  MEDICATIONS: Charted for review  SLEEP ARCHITECTURE: Total sleep time 366.5 minutes with sleep efficiency 90%. Stage I was 4.4%, stage II 82.4%, stage III absent, REM 13.2% of total sleep time. Sleep latency 7 minutes, REM latency 199 minutes, awake after sleep onset 33 minutes, arousal index 1.1.  RESPIRATORY DATA: Apnea hypoxemia index (AHI) 3.3 per hour. A total of 20 events were scored including 7 obstructive apneas and 13 hypoxemia his. Events were more common while nonsupine. REM AHI 17.3 per hour. There were not enough events to permit application of split protocol CPAP titration. Bedtime medication: None  OXYGEN DATA: Loud snoring with oxygen desaturation to a nadir of 84% on room air. With persistent low saturation, the technician added 1 L of nasal flow oxygen at about 12:30 PM. Mean oxygen saturation through the study was 91.7%.  CARDIAC DATA: Sinus rhythm  MOVEMENT/PARASOMNIA: No significant movement disturbance. Bathroom x2  IMPRESSION/ RECOMMENDATION:   1) Occasional respiratory events with sleep disturbance, within normal limits. AHI 3.3 per hour (the normal range for adults is an AHI between 0 and 5 events per hour). Loud snoring with oxygen desaturation to a nadir of 84% on room air. The technician added 1 L of supplemental nasal oxygen at 12:30 PM because of persistent low saturation. Mean oxygen saturation through the study was 91.7%.  Consider evaluation for home oxygen during sleep if  appropriate.  Signed Jetty Duhamel M.D. Waymon Budge Diplomate, American Board of Sleep Medicine  ELECTRONICALLY SIGNED ON:  07/26/2013, 1:09 PM [facility] PH: [phone]   FX: [phone] ACCREDITED BY THE AMERICAN ACADEMY OF SLEEP MEDICINE

## 2013-08-05 ENCOUNTER — Telehealth: Payer: Self-pay | Admitting: Pulmonary Disease

## 2013-08-05 NOTE — Telephone Encounter (Signed)
I spoke with the pt and advised that it takes 2 weeks for results and with the holidays this has been extended. I advised that Robert Cordova returns to the office next week and he will be catching up on results ans we will let him know as soon as we can about the results. Pt states understanding. Robert Cordova, CMA

## 2013-08-15 ENCOUNTER — Telehealth: Payer: Self-pay | Admitting: Family Medicine

## 2013-08-15 MED ORDER — INSULIN ASPART 100 UNIT/ML FLEXPEN: PEN_INJECTOR | SUBCUTANEOUS | Status: DC

## 2013-08-15 NOTE — Telephone Encounter (Signed)
Pt states he uses 100 units a day. 30 in am, 30 at lunch and 40 at dinner. Pt has been using the pen for the past year. insulin aspart (NOVOLOG) 100 UNIT/ML injection

## 2013-08-15 NOTE — Telephone Encounter (Signed)
Pharm needs clarification. Pt's RX NOVOLOG Pt needs pens and not vials. Also they need to know how much he uses a day so they can give correct no of pens. pls advise. [facility]

## 2013-08-15 NOTE — Telephone Encounter (Signed)
New Rx sent to pharmacy

## 2013-08-18 ENCOUNTER — Telehealth: Payer: Self-pay | Admitting: Pulmonary Disease

## 2013-08-18 ENCOUNTER — Other Ambulatory Visit: Payer: Self-pay | Admitting: Family Medicine

## 2013-08-18 ENCOUNTER — Encounter: Payer: Self-pay | Admitting: Family Medicine

## 2013-08-18 ENCOUNTER — Other Ambulatory Visit: Payer: Self-pay | Admitting: Pulmonary Disease

## 2013-08-18 ENCOUNTER — Ambulatory Visit (INDEPENDENT_AMBULATORY_CARE_PROVIDER_SITE_OTHER): Payer: BC Managed Care – PPO | Admitting: Family Medicine

## 2013-08-18 VITALS — BP 120/70 | Temp 98.2°F | Wt 262.0 lb

## 2013-08-18 DIAGNOSIS — E669 Obesity, unspecified: Secondary | ICD-10-CM

## 2013-08-18 DIAGNOSIS — IMO0002 Reserved for concepts with insufficient information to code with codable children: Secondary | ICD-10-CM

## 2013-08-18 DIAGNOSIS — G4733 Obstructive sleep apnea (adult) (pediatric): Secondary | ICD-10-CM

## 2013-08-18 DIAGNOSIS — E1065 Type 1 diabetes mellitus with hyperglycemia: Secondary | ICD-10-CM

## 2013-08-18 MED ORDER — LOSARTAN POTASSIUM 100 MG PO TABS: 100.0000 mg | ORAL_TABLET | Freq: Every day | ORAL | Status: DC

## 2013-08-18 NOTE — Progress Notes (Signed)
   Subjective:    Patient ID: Robert Cordova, male    DOB: 1950/11/14, 63 y.o.   MRN: [id number]  HPI Robert Cordova is a 63 year old married male nonsmoker who comes in today for followup of diabetes type 1  He takes 45 units of long-acting insulin at bedtime, 30 units before breakfast lunch and 40 units before his evening meal of regular insulin. Blood sugars vary from 80 to over 200. Interesting enough when he was in [place] his blood sugar was normal. He does not recall any change in his diet or exercise habits.  He had a pulmonary study in December which shows significant hypoxia. His pulse ox dropped to 84%  I'm wondering if the sleep dysfunction is contributing to his poor controlled diabetes therefore prior changing anything we will get a consult from Dr. Lanny Hurst ASAP   Review of Systems    negative Objective:   Physical Exam Well-developed and nourished male no acute distress vital signs stable he is afebrile BP 120/70 weight stable at 262       Assessment & Plan:  Diabetes type 1 not controlled,,,,,,,,, consult with Dr. Lanny Hurst ASAP to see if the OAS is contributing to his blood sugar fluctuations.

## 2013-08-18 NOTE — Telephone Encounter (Signed)
Have spoken with pt.  His study never made it to my box to read, and therefore the results never sent to me.   I have reviewed study, and he does not have sleep apnea.  He did desaturate, probably due to hypoventilation, and will need oxygen short term until after he loses weight post bariatric surgery.  Will start on 2 lpm at hs only.  Order has been sent to [facility].

## 2013-08-18 NOTE — Telephone Encounter (Addendum)
Pt had sleep study done on 07-17-13 and is requesting results. Pt states that he spoke with his PCP Dr. Sherren Mocha today and he is requesting that Dr. Gwenette Greet call him and discuss because he is concerned about some of the readings. He states that he was placed on oxygen, not a cpap and this concerns Dr. Sherren Mocha. Please advise. Heil Bing, CMA

## 2013-08-18 NOTE — Patient Instructions (Signed)
Call Dr. Lanny Hurst today  He will send me a note on his thoughts and then we'll go from there

## 2013-08-19 ENCOUNTER — Telehealth: Payer: Self-pay | Admitting: Pulmonary Disease

## 2013-08-19 ENCOUNTER — Other Ambulatory Visit: Payer: Self-pay | Admitting: Pulmonary Disease

## 2013-08-19 ENCOUNTER — Telehealth: Payer: Self-pay

## 2013-08-19 DIAGNOSIS — E669 Obesity, unspecified: Secondary | ICD-10-CM

## 2013-08-19 NOTE — Telephone Encounter (Signed)
Relevant patient education assigned to patient using Emmi. ° °

## 2013-08-19 NOTE — Telephone Encounter (Signed)
Please let pt know that his insurance is refusing to cover oxygen because the documentation came from a sleep study and not a regular overnight oxygen study.  Let him know I think this is ridiculous, and is a total waste of time.  However, we have no other option in order to get oxygen covered.  Will need to do a study at home with a pulse oximeter that records his oxygen level every 15 sec or so, and I can get a download.  Will send the order for oxygen again once I receive the results.

## 2013-08-19 NOTE — Telephone Encounter (Signed)
Pt aware--ok with moving forward Will await our call for set up of 02 Check

## 2013-08-22 ENCOUNTER — Telehealth: Payer: Self-pay | Admitting: *Deleted

## 2013-08-22 NOTE — Telephone Encounter (Signed)
ONO results have been received and placed in [facility] folder for review. Please advise, thanks.   

## 2013-08-22 NOTE — Telephone Encounter (Signed)
ONO with desat to 78% Please tell dme to get his oxygen to him.

## 2013-08-25 NOTE — Telephone Encounter (Signed)
02 Order faxed to [facility] on Friday 08/22/13. Rhonda J Cobb

## 2013-09-01 ENCOUNTER — Encounter: Payer: Self-pay | Admitting: Dietician

## 2013-09-01 ENCOUNTER — Encounter: Payer: BC Managed Care – PPO | Attending: Surgery | Admitting: Dietician

## 2013-09-01 VITALS — Ht 70.0 in | Wt 259.8 lb

## 2013-09-01 DIAGNOSIS — E669 Obesity, unspecified: Secondary | ICD-10-CM | POA: Insufficient documentation

## 2013-09-01 DIAGNOSIS — Z713 Dietary counseling and surveillance: Secondary | ICD-10-CM | POA: Insufficient documentation

## 2013-09-01 NOTE — Patient Instructions (Signed)
Patient to call the [facility] to enroll in Pre-Op and Post-Op Nutrition Education when surgery date is scheduled. 

## 2013-09-01 NOTE — Progress Notes (Signed)
  Pre-Op Assessment Visit:  Pre-Operative LAGB Surgery  Medical Nutrition Therapy:  Appt start time: 2330   End time:  1200.  Patient was seen on 09/01/2013 for Pre-Operative LAGB Nutrition Assessment. Assessment and letter of approval faxed to [facility] Bariatric Surgery Program coordinator on 09/01/2013.   Handouts given during visit include:  Pre-Op Goals Bariatric Surgery Protein Shakes Pre-op diet (per pt request)  Patient to call the [facility] to enroll in Pre-Op and Post-Op Nutrition Education when surgery date is scheduled. Patient states plans to attend bariatric surgery support group to gain more information from patients who have undergone LAGB.

## 2013-09-05 ENCOUNTER — Ambulatory Visit (HOSPITAL_COMMUNITY)
Admission: RE | Admit: 2013-09-05 | Discharge: 2013-09-05 | Disposition: A | Discharge status: Home / Self Care | Payer: BC Managed Care – PPO | Source: Ambulatory Visit | Attending: Surgery | Admitting: Surgery

## 2013-09-05 ENCOUNTER — Encounter (HOSPITAL_COMMUNITY)
Admission: RE | Disposition: A | Discharge status: Home / Self Care | Payer: Self-pay | Source: Ambulatory Visit | Attending: Surgery

## 2013-09-05 DIAGNOSIS — Z01818 Encounter for other preprocedural examination: Secondary | ICD-10-CM | POA: Insufficient documentation

## 2013-09-05 HISTORY — PX: BREATH TEK H PYLORI: SHX5422

## 2013-09-05 SURGERY — BREATH TEST, FOR HELICOBACTER PYLORI

## 2013-09-05 NOTE — Progress Notes (Signed)
09/05/13 0818  BREATH TEK ASSESSMENT  Referring MD Dr Hassell Done  Time of Last PO Intake 2100  Baseline Breath At: 0717  Pranactin Given At: 0720  Post-Dose Breath At: 0735  Sample 1 2.6  Sample 2 1.6  Test Negative

## 2013-09-08 ENCOUNTER — Encounter (HOSPITAL_COMMUNITY): Payer: Self-pay | Admitting: Surgery

## 2013-09-10 ENCOUNTER — Other Ambulatory Visit: Payer: Self-pay | Admitting: Family Medicine

## 2013-10-24 ENCOUNTER — Ambulatory Visit (INDEPENDENT_AMBULATORY_CARE_PROVIDER_SITE_OTHER): Payer: BC Managed Care – PPO | Admitting: Surgery

## 2013-10-24 ENCOUNTER — Encounter (INDEPENDENT_AMBULATORY_CARE_PROVIDER_SITE_OTHER): Payer: Self-pay | Admitting: Surgery

## 2013-10-24 VITALS — BP 128/82 | HR 80 | Temp 97.6°F | Resp 16 | Ht 70.0 in | Wt 263.8 lb

## 2013-10-24 DIAGNOSIS — E669 Obesity, unspecified: Secondary | ICD-10-CM

## 2013-10-24 MED ORDER — HYDROCODONE-ACETAMINOPHEN 7.5-325 MG/15ML PO SOLN: 10.0000 mL | Freq: Four times a day (QID) | ORAL | Status: DC | PRN

## 2013-10-24 NOTE — Progress Notes (Signed)
Chief Complaint: Morbid obesity BMI 36 with multiple comorbidities  History of Present Illness: Robert Cordova is an 63 y.o. male who has been to one of our seminars. He was initially injected in the lap band. When he saw Madilyn Hook regarding his thyroid he had discussed sleeve gastrectomy with him as well. We discussed both operations and in terms of risk and benefits. For more aggressive operation I would probably recommend a Roux-en-Y gastric bypass I think that's beyond the comfort level he has for surgery. I think the lap band to be good to old help him lose and hopefully allow his diabetes to go into remission.   UGI showed no hiatus hernia.  Slelep study suggested that he needed oxygen at night and this he credits with a lowering of his blood pressure.  The low carb diet has helped him to lower his blood sugar.   Past Medical History   Diagnosis  Date   .  ABNORMAL GLUCOSE NEC  03/18/2007   .  BLEPHARITIS, LEFT  10/29/2009   .  DIABETES-TYPE 2  04/18/2010   .  HYPERLIPIDEMIA  10/22/2007   .  HYPERTENSION  03/18/2007   .  NECK PAIN, CHRONIC  03/18/2007   .  NEVUS, MELANOCYTIC, FACE  10/07/2009    Past Surgical History   Procedure  Laterality  Date   .  Colonoscopy   2009     Dr. Sharlett Iles; multiple polyps   .  No prior surgery      Current Outpatient Prescriptions   Medication  Sig  Dispense  Refill   .  amLODipine (NORVASC) 10 MG tablet  Take 1 tablet (10 mg total) by mouth daily.  90 tablet  3   .  aspirin 81 MG tablet  Take 81 mg by mouth daily.     [name]  atorvastatin (LIPITOR) 10 MG tablet  Take 1 tablet (10 mg total) by mouth daily.  90 tablet  3   .  fluticasone (FLONASE) 50 MCG/ACT nasal spray      .  glucose blood (ONETOUCH VERIO) test strip  Test once daily or up to 4 times as directed, dx 250.01  400 each  12   .  HUMALOG MIX 75/25 KWIKPEN (75-25) 100 UNIT/ML SUPN      .  insulin lispro (HUMALOG KWIKPEN) 100 UNIT/ML SOPN  3 times a day (just before each meal) 30-30-40 units,  and pen needles 3/day  40 pen  3   .  Insulin Pen Needle (B-D ULTRAFINE III SHORT PEN) 31G X 8 MM MISC  USE AS DIRECTED  100 each  5   .  Insulin Pen Needle 31G X 8 MM MISC  1 each by Does not apply route daily.  100 each  3   .  losartan (COZAAR) 100 MG tablet  One and half tab daily  150 tablet  3   .  MINOXIDIL, TOPICAL, (ROGAINE EXTRA STRENGTH) 5 % SOLN  Apply small amounts once daily  180 mL  6   .  omeprazole (PRILOSEC) 40 MG capsule  Take 1 capsule (40 mg total) by mouth daily.  90 capsule  3    No current facility-administered medications for this visit.   Ace inhibitors  Family History   Problem  Relation  Age of Onset   .  Hyperlipidemia     .  Hypertension     .  Heart attack     .  Diabetes     .  Colon cancer  Neg Hx    .  Stomach cancer  Neg Hx    .  Esophageal cancer  Neg Hx    .  Rectal cancer  Neg Hx    Social History: reports that he has never smoked. He has never used smokeless tobacco. He reports that he drinks alcohol. He reports that he does not use illicit drugs.  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 rate 18, height 5\' 10"  (1.778 m), weight 257 lb 9.6 oz (116.847 kg).  Body mass index is 36.96 kg/(m^2).  Gen: WDWN WM NAD  Neurological: Alert and oriented to person, place, and time. Motor and sensory function is grossly intact  Head: Normocephalic and atraumatic.  Eyes: Conjunctivae are normal. Pupils are equal, round, and reactive to light. No scleral icterus.  Neck: Normal range of motion. Neck supple. No tracheal deviation or thyromegaly present.  Cardiovascular: SR without murmurs or gallops. No carotid bruits  Respiratory: Effort normal. No respiratory distress. No chest wall tenderness. Breath sounds normal. No wheezes, rales or rhonchi.  Abdomen: Nontender; obese   GU:  Musculoskeletal: Normal range of motion. Extremities are nontender. No cyanosis, edema or clubbing noted Lymphadenopathy: No cervical, preauricular, postauricular or axillary adenopathy is present Skin: Skin is warm and dry. No rash noted. No diaphoresis. No erythema. No pallor. Pscyh: Normal mood and affect. Behavior is normal. Judgment and thought content normal.  LABORATORY RESULTS:  No results found for this or any previous visit (from the past 48 hour(s)).  RADIOLOGY RESULTS:  No results found.  Problem List:  Patient Active Problem List    Diagnosis  Date Noted   .  Goiter  04/18/2013   .  Obesity, unspecified  04/10/2013   .  Osteoarthritis  07/26/2011   .  Plantar fasciitis  07/26/2011   .  Diabetes mellitus type 1, uncontrolled, insulin dependent  07/26/2011   .  BLEPHARITIS, LEFT  10/29/2009   .  NEVUS, MELANOCYTIC, FACE  10/07/2009   .  HYPERLIPIDEMIA  10/22/2007   .  HEMATURIA UNSPECIFIED  08/09/2007   .  HYPERTENSION  03/18/2007   .  NECK PAIN, CHRONIC  03/18/2007   Assessment & Plan:  Morbid obesity with DM;  Scheduled for Lapband placement.   Matt B. Hassell Done, MD, [facility]  [phone] beeper  [phone]

## 2013-10-24 NOTE — Patient Instructions (Signed)

## 2013-11-03 ENCOUNTER — Telehealth: Payer: Self-pay | Admitting: Dietician

## 2013-11-03 NOTE — Telephone Encounter (Signed)
Emailed Kara the Pre-Op diet to follow for 2 weeks before bariatric surgery.

## 2013-11-11 ENCOUNTER — Encounter (HOSPITAL_COMMUNITY): Payer: Self-pay | Admitting: Pharmacy Technician

## 2013-11-14 NOTE — Patient Instructions (Addendum)
Ermin Parisien  11/14/2013   Your procedure is scheduled on:  11/25/13   147pm-337pm  Report to [facility] at     1110  AM.  Call this number if you have problems the morning of surgery: [phone]   Remember: please follow diet instructions given by surgeon    Do not eat food after midnight, clear liquids from midnight until 7:45 am on 11/25/13 then nothing.    CLEAR LIQUID DIET   Foods Allowed                                                                     Foods Excluded  Coffee and tea, regular and decaf                             liquids that you cannot  Plain Jell-O in any flavor                                             see through such as: Fruit ices (not with fruit pulp)                                     milk, soups, orange juice  Iced Popsicles                                    All solid food Carbonated beverages, regular and diet                                    Cranberry, grape and apple juices Sports drinks like Gatorade Lightly seasoned clear broth or consume(fat free) Sugar, honey syrup  Sample Menu Breakfast                                Lunch                                     Supper Cranberry juice                    Beef broth                            Chicken broth Jell-O                                     Grape juice                           Apple juice Coffee or tea  Jell-O                                      Popsicle                                                Coffee or tea                        Coffee or tea   Take these medicines the morning of surgery with A SIP OF WATER: amlodipine, omeprazole    Do not wear jewelry,  Do not wear lotions, powders, or perfumes.    Men may shave face and neck.  Do not bring valuables to the hospital.  Contacts, dentures or bridgework may not be worn into surgery.  Leave suitcase in the car. After surgery it may be brought to your room.  For patients  admitted to the hospital, checkout time is 11:00 AM the day of discharge.       [facility] - Preparing for Surgery Before surgery, you can play an important role.  Because skin is not sterile, your skin needs to be as free of germs as possible.  You can reduce the number of germs on your skin by washing with CHG (chlorahexidine gluconate) soap before surgery.  CHG is an antiseptic cleaner which kills germs and bonds with the skin to continue killing germs even after washing. Please DO NOT use if you have an allergy to CHG or antibacterial soaps.  If your skin becomes reddened/irritated stop using the CHG and inform your nurse when you arrive at Short Stay. Do not shave (including legs and underarms) for at least 48 hours prior to the first CHG shower.  You may shave your face. Please follow these instructions carefully:  1.  Shower with CHG Soap the night before surgery and the  morning of Surgery.  2.  If you choose to wash your hair, wash your hair first as usual with your  normal  shampoo.  3.  After you shampoo, rinse your hair and body thoroughly to remove the  shampoo.                           4.  Use CHG as you would any other liquid soap.  You can apply chg directly  to the skin and wash                       Gently with a scrungie or clean washcloth.  5.  Apply the CHG Soap to your body ONLY FROM THE NECK DOWN.   Do not use on open                           Wound or open sores. Avoid contact with eyes, ears mouth and genitals (private parts).                        Genitals (private parts) with your normal soap.             6.  Wash thoroughly, paying special attention to the area where your surgery  will  be performed.  7.  Thoroughly rinse your body with warm water from the neck down.  8.  DO NOT shower/wash with your normal soap after using and rinsing off  the CHG Soap.                9.  Pat yourself dry with a clean towel.            10.  Wear clean pajamas.            11.  Place  clean sheets on your bed the night of your first shower and do not  sleep with pets. Day of Surgery : Do not apply any lotions/deodorants the morning of surgery.  Please wear clean clothes to the hospital/surgery center.  FAILURE TO FOLLOW THESE INSTRUCTIONS MAY RESULT IN THE CANCELLATION OF YOUR SURGERY PATIENT SIGNATURE_________________________________  NURSE SIGNATURE__________________________________

## 2013-11-17 ENCOUNTER — Encounter (HOSPITAL_COMMUNITY)
Admission: RE | Admit: 2013-11-17 | Discharge: 2013-11-17 | Disposition: A | Discharge status: Home / Self Care | Payer: BC Managed Care – PPO | Source: Ambulatory Visit | Attending: Surgery | Admitting: Surgery

## 2013-11-17 ENCOUNTER — Encounter: Payer: BC Managed Care – PPO | Attending: Surgery

## 2013-11-17 ENCOUNTER — Encounter (HOSPITAL_COMMUNITY): Payer: Self-pay

## 2013-11-17 VITALS — Ht 70.0 in | Wt 251.0 lb

## 2013-11-17 DIAGNOSIS — Z713 Dietary counseling and surveillance: Secondary | ICD-10-CM | POA: Insufficient documentation

## 2013-11-17 DIAGNOSIS — Z01812 Encounter for preprocedural laboratory examination: Secondary | ICD-10-CM | POA: Insufficient documentation

## 2013-11-17 DIAGNOSIS — E669 Obesity, unspecified: Secondary | ICD-10-CM | POA: Insufficient documentation

## 2013-11-17 HISTORY — DX: Unspecified osteoarthritis, unspecified site: M19.90

## 2013-11-17 HISTORY — DX: Gastro-esophageal reflux disease without esophagitis: K21.9

## 2013-11-17 HISTORY — DX: Nontoxic multinodular goiter: E04.2

## 2013-11-17 HISTORY — DX: Other allergy status, other than to drugs and biological substances: Z91.09

## 2013-11-17 HISTORY — DX: Personal history of other diseases of the digestive system: Z87.19

## 2013-11-17 LAB — CBC WITH DIFFERENTIAL/PLATELET
Basophils Absolute: 0 10*3/uL (ref 0.0–0.1)
Basophils Relative: 0 % (ref 0–1)
EOS ABS: 0.1 10*3/uL (ref 0.0–0.7)
EOS PCT: 2 % (ref 0–5)
HCT: 41 % (ref 39.0–52.0)
HEMOGLOBIN: 14.1 g/dL (ref 13.0–17.0)
Lymphocytes Relative: 41 % (ref 12–46)
Lymphs Abs: 2.2 10*3/uL (ref 0.7–4.0)
MCH: 31.3 pg (ref 26.0–34.0)
MCHC: 34.4 g/dL (ref 30.0–36.0)
MCV: 91.1 fL (ref 78.0–100.0)
MONO ABS: 0.5 10*3/uL (ref 0.1–1.0)
Monocytes Relative: 9 % (ref 3–12)
Neutro Abs: 2.6 10*3/uL (ref 1.7–7.7)
Neutrophils Relative %: 48 % (ref 43–77)
PLATELETS: 290 10*3/uL (ref 150–400)
RBC: 4.5 MIL/uL (ref 4.22–5.81)
RDW: 12.4 % (ref 11.5–15.5)
WBC: 5.3 10*3/uL (ref 4.0–10.5)

## 2013-11-17 LAB — COMPREHENSIVE METABOLIC PANEL
ALT: 24 U/L (ref 0–53)
AST: 19 U/L (ref 0–37)
Albumin: 4.1 g/dL (ref 3.5–5.2)
Alkaline Phosphatase: 67 U/L (ref 39–117)
BUN: 14 mg/dL (ref 6–23)
CO2: 27 mEq/L (ref 19–32)
CREATININE: 0.83 mg/dL (ref 0.50–1.35)
Calcium: 10 mg/dL (ref 8.4–10.5)
Chloride: 103 mEq/L (ref 96–112)
GFR calc non Af Amer: >90 mL/min (ref >90)
Glucose, Bld: 110 mg/dL — ABNORMAL HIGH (ref 70–99)
Potassium: 4.5 mEq/L (ref 3.7–5.3)
Sodium: 141 mEq/L (ref 137–147)
TOTAL PROTEIN: 7.7 g/dL (ref 6.0–8.3)
Total Bilirubin: 0.5 mg/dL (ref 0.3–1.2)

## 2013-11-17 NOTE — Progress Notes (Signed)
Chest x-ray 07/09/13 on [facility], EKG 07/09/13 on EP[facility]

## 2013-11-17 NOTE — Progress Notes (Signed)
11/17/13 0824  OBSTRUCTIVE SLEEP APNEA  Have you ever been diagnosed with sleep apnea through a sleep study? No ("recent test done- no sleep apnea, oxygen problem")  Do you snore loudly (loud enough to be heard through closed doors)?  0  Do you often feel tired, fatigued, or sleepy during the daytime? 0  Has anyone observed you stop breathing during your sleep? 0  Do you have, or are you being treated for high blood pressure? 1  BMI more than 35 kg/m2? 1  Age over 26 years old? 1  Neck circumference greater than 40 cm/18 inches? 0  Gender: 1  Obstructive Sleep Apnea Score 4  Score 4 or greater  Results sent to PCP

## 2013-11-19 NOTE — Progress Notes (Signed)
  Pre-Operative Nutrition Class:  Appt start time: 5750   End time:  1830.  Patient was seen on 11/17/2013 for Pre-Operative Bariatric Surgery Education [facility]   Surgery date: 11/25/2013 Surgery type: LAGB Start weight at [facility]: 259 on 09/01/13 Weight today: 251 lbs  TANITA  BODY COMP RESULTS  11/17/13   BMI (kg/m^2) 36   Fat Mass (lbs) 85.5   Fat Free Mass (lbs) 165.5   Total Body Water (lbs) 121   Samples given per MNT protocol. Patient educated on appropriate usage: Bariactiv Multivitamin (Qty 1) Lot #: [id number] S Exp: 12/2014  Bariatric Advantage Calcium Citrate (cherry - Qty 1) Lot #: [id number] Exp: 05/2014  Premier protein shake (chocolate - Qty 1) Lot #: [id number] Exp: 08/2014  Renee Pain Protein Powder (unflavored - Qty 1) Lot #: [id number] Exp: 11/2014  The following the learning objectives were met by the patient during this course:  Identify Pre-Op Dietary Goals and will begin 2 weeks pre-operatively  Identify appropriate sources of fluids and proteins   State protein recommendations and appropriate sources pre and post-operatively  Identify Post-Operative Dietary Goals and will follow for 2 weeks post-operatively  Identify appropriate multivitamin and calcium sources  Describe the need for physical activity post-operatively and will follow MD recommendations  State when to call healthcare provider regarding medication questions or post-operative complications  Handouts given during class include:  Pre-Op Bariatric Surgery Diet Handout  Protein Shake Handout  Post-Op Bariatric Surgery Nutrition Handout  BELT Program Information Flyer  Support Group Information Flyer  [facility] Bariatric Supplements Price List  Follow-Up Plan: Patient will follow-up at [facility] 2 weeks post operatively for diet advancement per MD.

## 2013-11-25 ENCOUNTER — Inpatient Hospital Stay (HOSPITAL_COMMUNITY): Payer: BC Managed Care – PPO | Admitting: Anesthesiology

## 2013-11-25 ENCOUNTER — Encounter (HOSPITAL_COMMUNITY)
Admission: RE | Disposition: A | Discharge status: Home / Self Care | Payer: Self-pay | Source: Ambulatory Visit | Attending: Surgery

## 2013-11-25 ENCOUNTER — Observation Stay (HOSPITAL_COMMUNITY)
Admission: RE | Admit: 2013-11-25 | Discharge: 2013-11-26 | Disposition: A | Discharge status: Home / Self Care | Payer: BC Managed Care – PPO | Source: Ambulatory Visit | Attending: Surgery | Admitting: Surgery

## 2013-11-25 ENCOUNTER — Encounter (HOSPITAL_COMMUNITY): Payer: BC Managed Care – PPO | Admitting: Anesthesiology

## 2013-11-25 ENCOUNTER — Encounter (HOSPITAL_COMMUNITY): Payer: Self-pay | Admitting: Anesthesiology

## 2013-11-25 DIAGNOSIS — Z7982 Long term (current) use of aspirin: Secondary | ICD-10-CM | POA: Insufficient documentation

## 2013-11-25 DIAGNOSIS — Z9884 Bariatric surgery status: Secondary | ICD-10-CM

## 2013-11-25 DIAGNOSIS — Z794 Long term (current) use of insulin: Secondary | ICD-10-CM | POA: Insufficient documentation

## 2013-11-25 DIAGNOSIS — E119 Type 2 diabetes mellitus without complications: Secondary | ICD-10-CM

## 2013-11-25 DIAGNOSIS — K219 Gastro-esophageal reflux disease without esophagitis: Secondary | ICD-10-CM | POA: Insufficient documentation

## 2013-11-25 DIAGNOSIS — I10 Essential (primary) hypertension: Secondary | ICD-10-CM

## 2013-11-25 DIAGNOSIS — E1065 Type 1 diabetes mellitus with hyperglycemia: Secondary | ICD-10-CM | POA: Insufficient documentation

## 2013-11-25 DIAGNOSIS — IMO0002 Reserved for concepts with insufficient information to code with codable children: Secondary | ICD-10-CM | POA: Insufficient documentation

## 2013-11-25 DIAGNOSIS — E669 Obesity, unspecified: Secondary | ICD-10-CM | POA: Diagnosis present

## 2013-11-25 DIAGNOSIS — Z6836 Body mass index (BMI) 36.0-36.9, adult: Secondary | ICD-10-CM | POA: Insufficient documentation

## 2013-11-25 DIAGNOSIS — E785 Hyperlipidemia, unspecified: Secondary | ICD-10-CM | POA: Insufficient documentation

## 2013-11-25 DIAGNOSIS — Z79899 Other long term (current) drug therapy: Secondary | ICD-10-CM | POA: Insufficient documentation

## 2013-11-25 DIAGNOSIS — K449 Diaphragmatic hernia without obstruction or gangrene: Secondary | ICD-10-CM | POA: Insufficient documentation

## 2013-11-25 HISTORY — PX: LAPAROSCOPIC GASTRIC BANDING: SHX1100

## 2013-11-25 HISTORY — PX: HIATAL HERNIA REPAIR: SHX195

## 2013-11-25 HISTORY — DX: Other diseases of pharynx: J39.2

## 2013-11-25 LAB — CBC
HEMATOCRIT: 34.5 % — AB (ref 39.0–52.0)
HEMOGLOBIN: 11.8 g/dL — AB (ref 13.0–17.0)
MCH: 31 pg (ref 26.0–34.0)
MCHC: 34.2 g/dL (ref 30.0–36.0)
MCV: 90.6 fL (ref 78.0–100.0)
Platelets: 309 10*3/uL (ref 150–400)
RBC: 3.81 MIL/uL — ABNORMAL LOW (ref 4.22–5.81)
RDW: 12.5 % (ref 11.5–15.5)
WBC: 9.4 10*3/uL (ref 4.0–10.5)

## 2013-11-25 LAB — CREATININE, SERUM
Creatinine, Ser: 0.79 mg/dL (ref 0.50–1.35)
GFR calc Af Amer: >90 mL/min (ref >90)
GFR calc non Af Amer: >90 mL/min (ref >90)

## 2013-11-25 LAB — GLUCOSE, CAPILLARY
GLUCOSE-CAPILLARY: 84 mg/dL (ref 70–99)
GLUCOSE-CAPILLARY: 81 mg/dL (ref 70–99)
Glucose-Capillary: 118 mg/dL — ABNORMAL HIGH (ref 70–99)
Glucose-Capillary: 138 mg/dL — ABNORMAL HIGH (ref 70–99)
Glucose-Capillary: 173 mg/dL — ABNORMAL HIGH (ref 70–99)
Glucose-Capillary: 177 mg/dL — ABNORMAL HIGH (ref 70–99)

## 2013-11-25 SURGERY — GASTRIC BANDING, LAPAROSCOPIC
Anesthesia: General

## 2013-11-25 MED ORDER — OXYCODONE HCL 5 MG/5ML PO SOLN
5.0000 mg | ORAL | Status: DC | PRN
  Administered 2013-11-26: 10 mg via ORAL
  Filled 2013-11-25: qty 10

## 2013-11-25 MED ORDER — HYDROMORPHONE HCL PF 1 MG/ML IJ SOLN
INTRAMUSCULAR | Status: AC
  Filled 2013-11-25: qty 1

## 2013-11-25 MED ORDER — LACTATED RINGERS IV SOLN
INTRAVENOUS | Status: DC
  Administered 2013-11-25: 1000 mL via INTRAVENOUS

## 2013-11-25 MED ORDER — HEPARIN SODIUM (PORCINE) 5000 UNIT/ML IJ SOLN
5000.0000 [IU] | Freq: Three times a day (TID) | INTRAMUSCULAR | Status: DC
  Administered 2013-11-25 – 2013-11-26 (×2): 5000 [IU] via SUBCUTANEOUS
  Filled 2013-11-25 (×5): qty 1

## 2013-11-25 MED ORDER — PROPOFOL 10 MG/ML IV BOLUS
INTRAVENOUS | Status: DC | PRN
  Administered 2013-11-25: 150 mg via INTRAVENOUS

## 2013-11-25 MED ORDER — LACTATED RINGERS IV SOLN
INTRAVENOUS | Status: DC | PRN
  Administered 2013-11-25 (×3): via INTRAVENOUS

## 2013-11-25 MED ORDER — SODIUM CHLORIDE 0.9 % IJ SOLN
INTRAMUSCULAR | Status: AC
  Filled 2013-11-25: qty 20

## 2013-11-25 MED ORDER — CISATRACURIUM BESYLATE 20 MG/10ML IV SOLN
INTRAVENOUS | Status: AC
  Filled 2013-11-25: qty 10

## 2013-11-25 MED ORDER — CEFOXITIN SODIUM 2 G IV SOLR
2.0000 g | INTRAVENOUS | Status: AC
  Administered 2013-11-25: 2 g via INTRAVENOUS

## 2013-11-25 MED ORDER — CHLORHEXIDINE GLUCONATE CLOTH 2 % EX PADS: 6.0000 | MEDICATED_PAD | Freq: Once | CUTANEOUS | Status: DC

## 2013-11-25 MED ORDER — DEXTROSE 5 % IV SOLN
INTRAVENOUS | Status: AC
  Filled 2013-11-25: qty 2

## 2013-11-25 MED ORDER — PHENYLEPHRINE HCL 10 MG/ML IJ SOLN
INTRAMUSCULAR | Status: DC | PRN
  Administered 2013-11-25 (×3): 120 ug via INTRAVENOUS

## 2013-11-25 MED ORDER — UNJURY CHICKEN SOUP POWDER
2.0000 [oz_av] | Freq: Four times a day (QID) | ORAL | Status: DC
  Filled 2013-11-25 (×4): qty 27

## 2013-11-25 MED ORDER — FENTANYL CITRATE 0.05 MG/ML IJ SOLN
INTRAMUSCULAR | Status: DC | PRN
  Administered 2013-11-25: 50 ug via INTRAVENOUS
  Administered 2013-11-25: 100 ug via INTRAVENOUS
  Administered 2013-11-25 (×2): 25 ug via INTRAVENOUS

## 2013-11-25 MED ORDER — METOCLOPRAMIDE HCL 5 MG/ML IJ SOLN
INTRAMUSCULAR | Status: AC
  Filled 2013-11-25: qty 2

## 2013-11-25 MED ORDER — ONDANSETRON HCL 4 MG/2ML IJ SOLN
INTRAMUSCULAR | Status: AC
  Filled 2013-11-25: qty 2

## 2013-11-25 MED ORDER — ACETAMINOPHEN 160 MG/5ML PO SOLN
325.0000 mg | ORAL | Status: DC | PRN
  Filled 2013-11-25: qty 20.3

## 2013-11-25 MED ORDER — GLYCOPYRROLATE 0.2 MG/ML IJ SOLN
INTRAMUSCULAR | Status: AC
  Filled 2013-11-25: qty 3

## 2013-11-25 MED ORDER — KCL IN DEXTROSE-NACL 20-5-0.45 MEQ/L-%-% IV SOLN
INTRAVENOUS | Status: DC
  Administered 2013-11-25 – 2013-11-26 (×2): via INTRAVENOUS
  Filled 2013-11-25 (×4): qty 1000

## 2013-11-25 MED ORDER — ACETAMINOPHEN 160 MG/5ML PO SOLN
650.0000 mg | ORAL | Status: DC | PRN
  Filled 2013-11-25: qty 20.3

## 2013-11-25 MED ORDER — 0.9 % SODIUM CHLORIDE (POUR BTL) OPTIME
TOPICAL | Status: DC | PRN
  Administered 2013-11-25: 1000 mL

## 2013-11-25 MED ORDER — FENTANYL CITRATE 0.05 MG/ML IJ SOLN
INTRAMUSCULAR | Status: AC
  Filled 2013-11-25: qty 5

## 2013-11-25 MED ORDER — HYDROMORPHONE HCL PF 1 MG/ML IJ SOLN
0.2500 mg | INTRAMUSCULAR | Status: DC | PRN
  Administered 2013-11-25 (×3): 0.5 mg via INTRAVENOUS

## 2013-11-25 MED ORDER — UNJURY VANILLA POWDER
2.0000 [oz_av] | Freq: Four times a day (QID) | ORAL | Status: DC
  Filled 2013-11-25 (×4): qty 27

## 2013-11-25 MED ORDER — PROPOFOL 10 MG/ML IV BOLUS
INTRAVENOUS | Status: AC
  Filled 2013-11-25: qty 20

## 2013-11-25 MED ORDER — PHENYLEPHRINE 40 MCG/ML (10ML) SYRINGE FOR IV PUSH (FOR BLOOD PRESSURE SUPPORT)
PREFILLED_SYRINGE | INTRAVENOUS | Status: AC
  Filled 2013-11-25: qty 10

## 2013-11-25 MED ORDER — UNJURY CHOCOLATE CLASSIC POWDER
2.0000 [oz_av] | Freq: Four times a day (QID) | ORAL | Status: DC
  Administered 2013-11-26: 2 [oz_av] via ORAL
  Filled 2013-11-25 (×4): qty 27

## 2013-11-25 MED ORDER — HEPARIN SODIUM (PORCINE) 5000 UNIT/ML IJ SOLN
5000.0000 [IU] | INTRAMUSCULAR | Status: AC
  Administered 2013-11-25: 5000 [IU] via SUBCUTANEOUS
  Filled 2013-11-25: qty 1

## 2013-11-25 MED ORDER — GLYCOPYRROLATE 0.2 MG/ML IJ SOLN
INTRAMUSCULAR | Status: DC | PRN
  Administered 2013-11-25: 0.6 mg via INTRAVENOUS
  Administered 2013-11-25: 0.2 mg via INTRAVENOUS

## 2013-11-25 MED ORDER — DEXAMETHASONE SODIUM PHOSPHATE 10 MG/ML IJ SOLN
INTRAMUSCULAR | Status: AC
  Filled 2013-11-25: qty 1

## 2013-11-25 MED ORDER — LIP MEDEX EX OINT
TOPICAL_OINTMENT | CUTANEOUS | Status: AC
  Administered 2013-11-25
  Filled 2013-11-25: qty 7

## 2013-11-25 MED ORDER — MIDAZOLAM HCL 5 MG/5ML IJ SOLN
INTRAMUSCULAR | Status: DC | PRN
  Administered 2013-11-25: 2 mg via INTRAVENOUS

## 2013-11-25 MED ORDER — BUPIVACAINE LIPOSOME 1.3 % IJ SUSP
20.0000 mL | Freq: Once | INTRAMUSCULAR | Status: DC
  Filled 2013-11-25: qty 20

## 2013-11-25 MED ORDER — DEXAMETHASONE SODIUM PHOSPHATE 10 MG/ML IJ SOLN
INTRAMUSCULAR | Status: DC | PRN
  Administered 2013-11-25: 10 mg via INTRAVENOUS

## 2013-11-25 MED ORDER — INSULIN ASPART 100 UNIT/ML ~~LOC~~ SOLN
0.0000 [IU] | SUBCUTANEOUS | Status: DC
  Administered 2013-11-25: 2 [IU] via SUBCUTANEOUS
  Administered 2013-11-25 (×2): 3 [IU] via SUBCUTANEOUS
  Administered 2013-11-26 (×2): 2 [IU] via SUBCUTANEOUS

## 2013-11-25 MED ORDER — MIDAZOLAM HCL 2 MG/2ML IJ SOLN
INTRAMUSCULAR | Status: AC
  Filled 2013-11-25: qty 2

## 2013-11-25 MED ORDER — BUPIVACAINE LIPOSOME 1.3 % IJ SUSP
INTRAMUSCULAR | Status: DC | PRN
  Administered 2013-11-25: 20 mL

## 2013-11-25 MED ORDER — ONDANSETRON HCL 4 MG/2ML IJ SOLN: 4.0000 mg | INTRAMUSCULAR | Status: DC | PRN

## 2013-11-25 MED ORDER — METOCLOPRAMIDE HCL 5 MG/ML IJ SOLN
INTRAMUSCULAR | Status: DC | PRN
  Administered 2013-11-25: 10 mg via INTRAVENOUS

## 2013-11-25 MED ORDER — PROMETHAZINE HCL 25 MG/ML IJ SOLN: 6.2500 mg | INTRAMUSCULAR | Status: DC | PRN

## 2013-11-25 MED ORDER — ONDANSETRON HCL 4 MG/2ML IJ SOLN
INTRAMUSCULAR | Status: DC | PRN
  Administered 2013-11-25 (×2): 2 mg via INTRAVENOUS

## 2013-11-25 MED ORDER — NEOSTIGMINE METHYLSULFATE 1 MG/ML IJ SOLN
INTRAMUSCULAR | Status: DC | PRN
  Administered 2013-11-25: 4 mg via INTRAVENOUS

## 2013-11-25 MED ORDER — CISATRACURIUM BESYLATE (PF) 10 MG/5ML IV SOLN
INTRAVENOUS | Status: DC | PRN
  Administered 2013-11-25: 12 mg via INTRAVENOUS
  Administered 2013-11-25: 5 mg via INTRAVENOUS
  Administered 2013-11-25: 3 mg via INTRAVENOUS

## 2013-11-25 MED ORDER — MORPHINE SULFATE 10 MG/ML IJ SOLN
2.0000 mg | INTRAMUSCULAR | Status: DC | PRN
  Administered 2013-11-25 (×3): 2 mg via INTRAVENOUS
  Filled 2013-11-25 (×3): qty 1

## 2013-11-25 MED ORDER — GLYCOPYRROLATE 0.2 MG/ML IJ SOLN
INTRAMUSCULAR | Status: AC
  Filled 2013-11-25: qty 1

## 2013-11-25 MED ORDER — SODIUM CHLORIDE 0.9 % IJ SOLN
INTRAMUSCULAR | Status: DC | PRN
Start: 2013-11-25 — End: 2013-11-25
  Administered 2013-11-25: 20 mL via INTRAVENOUS

## 2013-11-25 SURGICAL SUPPLY — 58 items
ADH SKN CLS APL DERMABOND .7 (GAUZE/BANDAGES/DRESSINGS) ×2
APL SKNCLS STERI-STRIP NONHPOA (GAUZE/BANDAGES/DRESSINGS)
BAND LAP 10.0 W/TUBES (Band) ×1 IMPLANT
BAND LAP 10.0CM W/TUBES (Band) ×1 IMPLANT
BENZOIN TINCTURE PRP APPL 2/3 (GAUZE/BANDAGES/DRESSINGS) IMPLANT
BLADE SURG 15 STRL LF DISP TIS (BLADE) ×2 IMPLANT
BLADE SURG 15 STRL SS (BLADE) ×4
CLOSURE WOUND 1/2 X4 (GAUZE/BANDAGES/DRESSINGS)
DECANTER SPIKE VIAL GLASS SM (MISCELLANEOUS) ×6 IMPLANT
DERMABOND ADVANCED (GAUZE/BANDAGES/DRESSINGS) ×2
DERMABOND ADVANCED .7 DNX12 (GAUZE/BANDAGES/DRESSINGS) ×2 IMPLANT
DEVICE SUT QUICK LOAD TK 5 (STAPLE) ×10 IMPLANT
DEVICE SUT TI-KNOT TK 5X26 (MISCELLANEOUS) ×3 IMPLANT
DEVICE SUTURE ENDOST 10MM (ENDOMECHANICALS) ×2 IMPLANT
DEVICE TI KNOT TK5 (MISCELLANEOUS) ×1
DISSECTOR BLUNT TIP ENDO 5MM (MISCELLANEOUS) ×2 IMPLANT
DRAPE CAMERA CLOSED 9X96 (DRAPES) ×4 IMPLANT
ELECT REM PT RETURN 9FT ADLT (ELECTROSURGICAL) ×4
ELECTRODE REM PT RTRN 9FT ADLT (ELECTROSURGICAL) ×2 IMPLANT
GLOVE BIOGEL M 8.0 STRL (GLOVE) ×4 IMPLANT
GOWN STRL REUS W/TWL XL LVL3 (GOWN DISPOSABLE) ×16 IMPLANT
HOVERMATT SINGLE USE (MISCELLANEOUS) ×4 IMPLANT
KIT BASIN OR (CUSTOM PROCEDURE TRAY) ×4 IMPLANT
MESH HERNIA 1X4 RECT BARD (Mesh General) ×2 IMPLANT
MESH HERNIA BARD 1X4 (Mesh General) ×2 IMPLANT
NDL SPNL 22GX3.5 QUINCKE BK (NEEDLE) ×2 IMPLANT
NEEDLE SPNL 22GX3.5 QUINCKE BK (NEEDLE) ×4 IMPLANT
PACK UNIVERSAL I (CUSTOM PROCEDURE TRAY) ×4 IMPLANT
QUICK LOAD TK 5 (STAPLE) ×4
SCISSORS LAP 5X45 EPIX DISP (ENDOMECHANICALS) IMPLANT
SET IRRIG TUBING LAPAROSCOPIC (IRRIGATION / IRRIGATOR) ×2 IMPLANT
SHEARS CURVED HARMONIC AC 45CM (MISCELLANEOUS) IMPLANT
SLEEVE ADV FIXATION 5X100MM (TROCAR) IMPLANT
SOLUTION ANTI FOG 6CC (MISCELLANEOUS) ×4 IMPLANT
SPONGE GAUZE 4X4 12PLY (GAUZE/BANDAGES/DRESSINGS) ×2 IMPLANT
SPONGE LAP 18X18 X RAY DECT (DISPOSABLE) ×4 IMPLANT
STAPLER VISISTAT 35W (STAPLE) ×4 IMPLANT
STRIP CLOSURE SKIN 1/2X4 (GAUZE/BANDAGES/DRESSINGS) IMPLANT
SUT ETHIBOND 2 0 SH (SUTURE) ×12
SUT ETHIBOND 2 0 SH 36X2 (SUTURE) ×6 IMPLANT
SUT PROLENE 2 0 CT2 30 (SUTURE) ×4 IMPLANT
SUT SILK 0 (SUTURE) ×4
SUT SILK 0 30XBRD TIE 6 (SUTURE) ×2 IMPLANT
SUT SURGIDAC NAB ES-9 0 48 120 (SUTURE) ×2 IMPLANT
SUT VIC AB 2-0 SH 27 (SUTURE)
SUT VIC AB 2-0 SH 27X BRD (SUTURE) IMPLANT
SUT VIC AB 4-0 SH 18 (SUTURE) ×4 IMPLANT
SUT VICRYL 0 UR6 27IN ABS (SUTURE) ×2 IMPLANT
SYR 20CC LL (SYRINGE) ×8 IMPLANT
TOWEL OR 17X26 10 PK STRL BLUE (TOWEL DISPOSABLE) ×6 IMPLANT
TOWEL OR NON WOVEN STRL DISP B (DISPOSABLE) ×4 IMPLANT
TROCAR ADV FIXATION 12X100MM (TROCAR) ×4 IMPLANT
TROCAR BLADELESS 15MM (ENDOMECHANICALS) ×4 IMPLANT
TROCAR BLADELESS OPT 5 100 (ENDOMECHANICALS) ×4 IMPLANT
TROCAR XCEL NON-BLD 11X100MML (ENDOMECHANICALS) ×2 IMPLANT
TROCAR XCEL UNIV SLVE 11M 100M (ENDOMECHANICALS) IMPLANT
TUBE CALIBRATION LAPBAND (TUBING) ×4 IMPLANT
TUBING INSUFFLATION 10FT LAP (TUBING) ×4 IMPLANT

## 2013-11-25 NOTE — Transfer of Care (Signed)
Immediate Anesthesia Transfer of Care Note  Patient: Robert Cordova  Procedure(s) Performed: Procedure(s): LAPAROSCOPIC GASTRIC BANDING (N/A) LAPAROSCOPIC REPAIR OF HIATAL HERNIA  Patient Location: PACU  Anesthesia Type:General  Level of Consciousness: awake, alert , oriented and patient cooperative  Airway & Oxygen Therapy: Patient Spontanous Breathing and Patient connected to face mask oxygen  Post-op Assessment: Report given to PACU RN and Post -op Vital signs reviewed and stable  Post vital signs: stable  Complications: No apparent anesthesia complications

## 2013-11-25 NOTE — Anesthesia Postprocedure Evaluation (Signed)
  Anesthesia Post-op Note  Patient: Robert Cordova  Procedure(s) Performed: Procedure(s) (LRB): LAPAROSCOPIC GASTRIC BANDING (N/A) LAPAROSCOPIC REPAIR OF HIATAL HERNIA  Patient Location: PACU  Anesthesia Type: General  Level of Consciousness: awake and alert   Airway and Oxygen Therapy: Patient Spontanous Breathing  Post-op Pain: mild  Post-op Assessment: Post-op Vital signs reviewed, Patient's Cardiovascular Status Stable, Respiratory Function Stable, Patent Airway and No signs of Nausea or vomiting  Last Vitals:  Filed Vitals:   11/25/13 1625  BP: 119/68  Pulse:   Temp: 36.7 C  Resp:     Post-op Vital Signs: stable   Complications: No apparent anesthesia complications

## 2013-11-25 NOTE — Anesthesia Preprocedure Evaluation (Signed)
Anesthesia Evaluation  Patient identified by MRN, date of birth, ID band Patient awake    Reviewed: Allergy & Precautions, H&P , NPO status , Patient's Chart, lab work & pertinent test results  Airway Mallampati: III TM Distance: <3 FB Neck ROM: Full    Dental no notable dental hx.    Pulmonary neg pulmonary ROS,  breath sounds clear to auscultation  Pulmonary exam normal       Cardiovascular hypertension, Pt. on medications Rhythm:Regular Rate:Normal     Neuro/Psych negative neurological ROS  negative psych ROS   GI/Hepatic Neg liver ROS, GERD-  Medicated,  Endo/Other  diabetes, Insulin DependentMorbid obesity  Renal/GU negative Renal ROS  negative genitourinary   Musculoskeletal negative musculoskeletal ROS (+)   Abdominal   Peds negative pediatric ROS (+)  Hematology negative hematology ROS (+)   Anesthesia Other Findings   Reproductive/Obstetrics negative OB ROS                           Anesthesia Physical Anesthesia Plan  ASA: III  Anesthesia Plan: General   Post-op Pain Management:    Induction: Intravenous  Airway Management Planned: Oral ETT  Additional Equipment:   Intra-op Plan:   Post-operative Plan: Extubation in OR  Informed Consent: I have reviewed the patients History and Physical, chart, labs and discussed the procedure including the risks, benefits and alternatives for the proposed anesthesia with the patient or authorized representative who has indicated his/her understanding and acceptance.   Dental advisory given  Plan Discussed with: CRNA and Surgeon  Anesthesia Plan Comments:         Anesthesia Quick Evaluation

## 2013-11-25 NOTE — Op Note (Signed)
11/25/2013  Surgeon: Kaylyn Lim, MD, FACS Asst:  Alphonsa Overall, MD, FACS  Procedure: Laparoscopic adjustable gastric banding with APS; repair of hiatus hernia with one posterior suture  Anes:  General  EBL:  Minimal  Description of Procedure  The patient was taken to OR # 1 and given general anesthesia.  After a prep with PCMX the patient was draped and a timeout performed.  Access to the abdomen was achieved with a 0 degree 5 mm Optiview technique through the left upper quadrant.    Adhesions were nonexistent.  Ports were placed to the the right of the midline including a 15 trocar in  the right upper quadrant placed obliquely.  The Sherrie Sport was used to retract the left lateral segment and the peritoneum was incised along the left crus.   The EJ junction as assessed for a hiatus hernia and a dimple was felt.  A balloon test positive with 10 cc of air in the balloon.  The right crus was incised and a retroesophageal dissection revealed both right and left crua.  A single posterior suture secured the hiatus and the balloon test was negative.     The pars flaccida technique was utilized to insert the blunt "finger " dissector from right to left behind the stomach.  This created a target zone to pass the band passer.     The lapband APS  Had been previously flushed and was inserted through the 15 trocar.  It was placed in the tip of "the finger"  and pulled around behind the stomach.   The band was plicated with 3 sutures placed with free needles using 2-0 Ethibond. .  The tubing was brought out through the lower incision on the right and connected to the port which had mesh sewn onto the back and was placed into the subcutaneous pocket.  The incisions were injected with Exparel  and closed with 4-0 vicryl and Dermabond.     The patient was taken to the PACU in stable condition.    Matt B. Hassell Done, [place], [facility], [place]

## 2013-11-25 NOTE — Interval H&P Note (Signed)
History and Physical Interval Note:  11/25/2013 12:36 PM  Robert Cordova  has presented today for surgery, with the diagnosis of morbid obesity   The various methods of treatment have been discussed with the patient and family. After consideration of risks, benefits and other options for treatment, the patient has consented to  Procedure(s): LAPAROSCOPIC GASTRIC BANDING (N/A) as a surgical intervention .  The patient's history has been reviewed, patient examined, no change in status, stable for surgery.  I have reviewed the patient's chart and labs.  Questions were answered to the patient's satisfaction.     [name]

## 2013-11-25 NOTE — H&P (Signed)
Chief Complaint: Morbid obesity BMI 36 with multiple comorbidities  History of Present Illness: Robert Cordova is an 63 y.o. male who has been to one of our seminars. He was initially interested in the lap band. When he saw Madilyn Hook regarding his thyroid he had discussed sleeve gastrectomy with him as well. We discussed both operations and in terms of risk and benefits. For more aggressive operation I would probably recommend a Roux-en-Y gastric bypass but I think that's beyond the comfort level he has for surgery. I think the lap band to be good to  help him lose weight and hopefully allow his diabetes to go into remission.  UGI showed no hiatus hernia. Slelep study suggested that he needed oxygen at night and this he credits this  with a lowering of his blood pressure. The low carb diet has helped him to lower his blood sugar.  Past Medical History   Diagnosis  Date   .  ABNORMAL GLUCOSE NEC  03/18/2007   .  BLEPHARITIS, LEFT  10/29/2009   .  DIABETES-TYPE 2  04/18/2010   .  HYPERLIPIDEMIA  10/22/2007   .  HYPERTENSION  03/18/2007   .  NECK PAIN, CHRONIC  03/18/2007   .  NEVUS, MELANOCYTIC, FACE  10/07/2009    Past Surgical History   Procedure  Laterality  Date   .  Colonoscopy   2009     Dr. Sharlett Iles; multiple polyps   .  No prior surgery      Current Outpatient Prescriptions   Medication  Sig  Dispense  Refill   .  amLODipine (NORVASC) 10 MG tablet  Take 1 tablet (10 mg total) by mouth daily.  90 tablet  3   .  aspirin 81 MG tablet  Take 81 mg by mouth daily.     [name]  atorvastatin (LIPITOR) 10 MG tablet  Take 1 tablet (10 mg total) by mouth daily.  90 tablet  3   .  fluticasone (FLONASE) 50 MCG/ACT nasal spray      .  glucose blood (ONETOUCH VERIO) test strip  Test once daily or up to 4 times as directed, dx 250.01  400 each  12   .  HUMALOG MIX 75/25 KWIKPEN (75-25) 100 UNIT/ML SUPN      .  insulin lispro (HUMALOG KWIKPEN) 100 UNIT/ML SOPN  3 times a day (just before each meal)  30-30-40 units, and pen needles 3/day  40 pen  3   .  Insulin Pen Needle (B-D ULTRAFINE III SHORT PEN) 31G X 8 MM MISC  USE AS DIRECTED  100 each  5   .  Insulin Pen Needle 31G X 8 MM MISC  1 each by Does not apply route daily.  100 each  3   .  losartan (COZAAR) 100 MG tablet  One and half tab daily  150 tablet  3   .  MINOXIDIL, TOPICAL, (ROGAINE EXTRA STRENGTH) 5 % SOLN  Apply small amounts once daily  180 mL  6   .  omeprazole (PRILOSEC) 40 MG capsule  Take 1 capsule (40 mg total) by mouth daily.  90 capsule  3    No current facility-administered medications for this visit.   Ace inhibitors  Family History   Problem  Relation  Age of Onset   .  Hyperlipidemia     .  Hypertension     .  Heart attack     .  Diabetes     .  Colon cancer  Neg Hx    .  Stomach cancer  Neg Hx    .  Esophageal cancer  Neg Hx    .  Rectal cancer  Neg Hx    Social History: reports that he has never smoked. He has never used smokeless tobacco. He reports that he drinks alcohol. He reports that he does not use illicit drugs.  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 rate 18, height 5\' 10"  (1.778 m), weight 257 lb 9.6 oz (116.847 kg).  Body mass index is 36.96 kg/(m^2).  Gen: WDWN WM NAD  Neurological: Alert and oriented to person, place, and time. Motor and sensory function is grossly intact  Head: Normocephalic and atraumatic.  Eyes: Conjunctivae are normal. Pupils are equal, round, and reactive to light. No scleral icterus.  Neck: Normal range of motion. Neck supple. No tracheal deviation or thyromegaly present.  Cardiovascular: SR without murmurs or gallops. No carotid bruits  Respiratory: Effort normal. No respiratory distress. No chest wall tenderness. Breath sounds normal. No wheezes, rales or rhonchi.  Abdomen:  Nontender; obese  GU:  Musculoskeletal: Normal range of motion. Extremities are nontender. No cyanosis, edema or clubbing noted Lymphadenopathy: No cervical, preauricular, postauricular or axillary adenopathy is present Skin: Skin is warm and dry. No rash noted. No diaphoresis. No erythema. No pallor. Pscyh: Normal mood and affect. Behavior is normal. Judgment and thought content normal.  LABORATORY RESULTS:  No results found for this or any previous visit (from the past 48 hour(s)).  RADIOLOGY RESULTS:  No results found.  Problem List:  Patient Active Problem List    Diagnosis  Date Noted   .  Goiter  04/18/2013   .  Obesity, unspecified  04/10/2013   .  Osteoarthritis  07/26/2011   .  Plantar fasciitis  07/26/2011   .  Diabetes mellitus type 1, uncontrolled, insulin dependent  07/26/2011   .  BLEPHARITIS, LEFT  10/29/2009   .  NEVUS, MELANOCYTIC, FACE  10/07/2009   .  HYPERLIPIDEMIA  10/22/2007   .  HEMATURIA UNSPECIFIED  08/09/2007   .  HYPERTENSION  03/18/2007   .  NECK PAIN, CHRONIC  03/18/2007   Assessment & Plan:  Morbid obesity with DM; Scheduled for Lapband placement.  Matt B. Hassell Done, MD, [facility]  [phone] beeper  [phone]

## 2013-11-26 ENCOUNTER — Observation Stay (HOSPITAL_COMMUNITY): Payer: BC Managed Care – PPO

## 2013-11-26 ENCOUNTER — Encounter (HOSPITAL_COMMUNITY): Payer: Self-pay | Admitting: Surgery

## 2013-11-26 LAB — CBC WITH DIFFERENTIAL/PLATELET
BASOS ABS: 0 10*3/uL (ref 0.0–0.1)
BASOS PCT: 0 % (ref 0–1)
Eosinophils Absolute: 0 10*3/uL (ref 0.0–0.7)
Eosinophils Relative: 0 % (ref 0–5)
HCT: 35.2 % — ABNORMAL LOW (ref 39.0–52.0)
Hemoglobin: 11.8 g/dL — ABNORMAL LOW (ref 13.0–17.0)
LYMPHS PCT: 12 % (ref 12–46)
Lymphs Abs: 0.9 10*3/uL (ref 0.7–4.0)
MCH: 30.6 pg (ref 26.0–34.0)
MCHC: 33.5 g/dL (ref 30.0–36.0)
MCV: 91.2 fL (ref 78.0–100.0)
MONO ABS: 0.5 10*3/uL (ref 0.1–1.0)
Monocytes Relative: 7 % (ref 3–12)
NEUTROS PCT: 81 % — AB (ref 43–77)
Neutro Abs: 5.9 10*3/uL (ref 1.7–7.7)
Platelets: 299 10*3/uL (ref 150–400)
RBC: 3.86 MIL/uL — ABNORMAL LOW (ref 4.22–5.81)
RDW: 12.6 % (ref 11.5–15.5)
WBC: 7.3 10*3/uL (ref 4.0–10.5)

## 2013-11-26 LAB — GLUCOSE, CAPILLARY
Glucose-Capillary: 129 mg/dL — ABNORMAL HIGH (ref 70–99)
Glucose-Capillary: 110 mg/dL — ABNORMAL HIGH (ref 70–99)
Glucose-Capillary: 132 mg/dL — ABNORMAL HIGH (ref 70–99)

## 2013-11-26 MED ORDER — MORPHINE SULFATE 2 MG/ML IJ SOLN
2.0000 mg | INTRAMUSCULAR | Status: DC | PRN
  Administered 2013-11-26 (×2): 2 mg via INTRAVENOUS
  Filled 2013-11-26 (×2): qty 1

## 2013-11-26 MED ORDER — IOHEXOL 300 MG/ML  SOLN
25.0000 mL | Freq: Once | INTRAMUSCULAR | Status: AC | PRN
  Administered 2013-11-26: 20 mL via ORAL

## 2013-11-26 NOTE — Progress Notes (Signed)
Nutrition Education Note  Patient identified via consult for DROP protocol.   Wt Readings from Last 5 Encounters:  11/25/13 245 lb (111.131 kg)  11/25/13 245 lb (111.131 kg)  11/19/13 251 lb (113.853 kg)  11/17/13 250 lb (113.399 kg)  10/24/13 263 lb 12.8 oz (119.659 kg)    Body mass index is 35.15 kg/(m^2). Patient meets criteria for class II obesity based on current BMI.   Discussed 2 week post op diet with pt. Emphasized that liquids consumed must be non-carbonated, non-caffeinated, and sugar free. Importance of adequate hydration discussed. Diet questions answered.   Diet: First 2 Weeks  You will see the nutritionist about two (2) weeks after your surgery. The nutritionist will increase the types of foods you can eat if you are handling liquids well:  If you have severe vomiting or nausea and cannot handle clear liquids lasting longer than 1 day, call your surgeon  Protein Shake  Drink at least 2 ounces of shake 5-6 times per day  Each serving of protein shakes (usually 8 - 12 ounces) should have a minimum of:  15 grams of protein  And no more than 5 grams of carbohydrate  Goal for protein each day:  Men = 80 grams per day  Women = 60 grams per day  Protein powder may be added to fluids such as non-fat milk or Lactaid milk or Soy milk (limit to 35 grams added protein powder per serving)   Hydration  Slowly increase the amount of water and other clear liquids as tolerated (See Acceptable Fluids)  Slowly increase the amount of protein shake as tolerated  Sip fluids slowly and throughout the day  May use sugar substitutes in small amounts (no more than 6 - 8 packets per day; i.e. Splenda)   Fluid Goal  The first goal is to drink at least 8 ounces of protein shake/drink per day (or as directed by the nutritionist); some examples of protein shakes are Johnson [employer] Johnson, AMR Corporation, EAS Edge HP, and Unjury. See handout from pre-op Bariatric Education Class:  Slowly increase the  amount of protein shake you drink as tolerated  You may find it easier to slowly sip shakes throughout the day  It is important to get your proteins in first  Your fluid goal is to drink 64 - 100 ounces of fluid daily  It may take a few weeks to build up to this  32 oz (or more) should be clear liquids  And  32 oz (or more) should be full liquids (see below for examples)  Liquids should not contain sugar, caffeine, or carbonation   Clear Liquids:  Water or Sugar-free flavored water (i.e. Fruit H2O, Propel)  Decaffeinated coffee or tea (sugar-free)  Crystal Lite, Wyler's Lite, Minute Maid Lite  Sugar-free Jell-O  Bouillon or broth  Sugar-free Popsicle: *Less than 20 calories each; Limit 1 per day   Full Liquids:  Protein Shakes/Drinks + 2 choices per day of other full liquids  Full liquids must be:  No More Than 12 grams of Carbs per serving  No More Than 3 grams of Fat per serving  Strained low-fat cream soup  Non-Fat milk  Fat-free Lactaid Milk  Sugar-free yogurt (Dannon Lite & Fit, [place] yogurt)   Shoreline MS, [place], [place] Pager  [phone] After Hours Pager

## 2013-11-26 NOTE — Progress Notes (Signed)
Patient is alert and oriented.  Pain is controlled, and patient is tolerating fluids.  Plan to advance to protein shake today.  Reviewed Adjustable gastric band discharge instructions with patient, patient able to articulate understanding.  Provided information on BELT program, Support Group and [facility] outpatient pharmacy. All questions answered, will continue to monitor.  

## 2013-11-26 NOTE — Discharge Summary (Signed)
Physician Discharge Summary  Patient ID: Robert Cordova MRN: [id number] DOB/AGE: 01/30/1951 63 y.o.  Admit date: 11/25/2013 Discharge date: 11/26/2013  Admission Diagnoses:  Obesity and DM; GERD  Discharge Diagnoses:  same  Active Problems:   Lapband APS + hiatus hernia repair April 2015   Obesity   Surgery:  lapband APS + hiatus hernia repair  Discharged Condition: improved  Hospital Course:   Had surgery.  Did well.  UGI this am looked good.  Ready for discharge  Consults: none  Significant Diagnostic Studies: UGI    Discharge Exam: Blood pressure 127/67, pulse 76, temperature 98 F (36.7 C), temperature source Oral, resp. rate 20, height 5\' 10"  (1.778 m), weight 245 lb (111.131 kg), SpO2 94.00%. Minimal pain.  Doing well.  Ready for discharge  Disposition: 01-Home or Self Care  Discharge Orders   Future Appointments Provider Department Dept Phone   12/09/2013 3:30 PM [facility] Post[facility] [phone]   Future Orders Complete By Expires   Diet - low sodium heart healthy  As directed    Discharge instructions  As directed    Discharge wound care:  As directed    Increase activity slowly  As directed        Medication List         amLODipine 10 MG tablet  Commonly known as:  NORVASC  Take 5 mg by mouth every morning. Takes 1/2 tablet     aspirin 81 MG tablet  Take 81 mg by mouth daily.     atorvastatin 10 MG tablet  Commonly known as:  LIPITOR  Take 10 mg by mouth every evening.     hydrochlorothiazide 25 MG tablet  Commonly known as:  HYDRODIURIL  Take 25 mg by mouth every morning.     insulin aspart 100 UNIT/ML injection  Commonly known as:  novoLOG  Inject 30-40 Units into the skin 3 (three) times daily before meals. 30 units in the morning and at lunch and 40 units at dinner.     insulin glargine 100 UNIT/ML injection  Commonly known as:  LANTUS  Inject 50 Units into the skin at bedtime.      losartan 100 MG tablet  Commonly known as:  COZAAR  Take 100 mg by mouth daily.     metFORMIN 500 MG tablet  Commonly known as:  GLUCOPHAGE  Take 1,000 mg by mouth at bedtime.     minoxidil 2 % external solution  Commonly known as:  ROGAINE  Apply 1 application topically daily.     omeprazole 40 MG capsule  Commonly known as:  PRILOSEC  Take 40 mg by mouth daily.     OXYGEN-HELIUM IN  Inhale into the lungs.           Follow-up Information   Follow up with [name], MD.   Specialty:  General Surgery   Contact information:   [address] [place] [address] [phone]       Signed: [name] 11/26/2013, 11:44 AM

## 2013-11-26 NOTE — Discharge Instructions (Signed)
Laparoscopic Gastric Band Surgery Care After These instructions give you information on caring for yourself after your procedure. Your doctor may also give you more specific instructions. Call your doctor if you have any problems or questions after your procedure. HOME CARE   Take walks throughout the day. Do not sit for longer than 1 hour while awake for 4 to 6 weeks.  You may shower 2 days after surgery. Pat the surgery cuts (incisions) dry. Do not rub the surgery cuts.  Do your coughing and deep breathing exercises.  Do not lift, push, or pull anything heavy until your doctor says it is okay.  Only take medicines as told by your doctor. Do not drive while taking pain medicine.  Drink plenty of fluids to keep your pee (urine) clear or pale yellow.  Stay on a liquid diet as long as your doctor tells you to.  Do not drink caffeine for 1 month.  Change bandages (dressings) as told by your doctor.  Check your surgery cuts for redness, pufffiness (swelling), abnormal coloring, fluid, or bleeding.  Follow your doctor's advice about vitamin and protein needs after surgery. GET HELP RIGHT AWAY IF:  You feel sick to your stomach (nauseous) and throw up (vomit).  You have pain and discomfort with swallowing.  You develop shortness of breath or difficulty breathing.  You have pain, puffiness, or feel warmth on your lower body.  You have very bad calf pain or pain not relieved by medicine.  You have a temperature by mouth above 102 F (38.9 C).  Your surgery cuts look red, puffy, or they leak fluid.  Your poop (stool) is black, tarry, or dark red.  You have chills.  You have chest pain.  You feel confused.  You have slurred speech.  You feel lightheaded when standing.  You suddenly feel weak.  You have any questions or concerns. MAKE SURE YOU:   Understand these instructions.  Will watch your condition.  Will get help right away if you are not doing well or get  worse. Document Released: 08/26/2010 Document Revised: 11/18/2012 Document Reviewed: 08/26/2010 [facility] Patient Information 2014 [place], [place].                    ADJUSTABLE GASTRIC BAND  Home Care Instructions   These instructions are to help you care for yourself when you go home.  Call: If you have any problems. Call [phone] and ask for the surgeon on call If you need immediate assistance come to the ER at [facility]. Tell the ER staff you are a new post-op gastric banding patient  Signs and symptoms to report: Severe  vomiting or nausea If you cannot handle clear liquids for longer than 1 day, call your surgeon Abdominal pain which does not get better after taking your pain medication Fever greater than 100.4  F and chills Heart rate over 100 beats a minute Trouble breathing Chest pain Redness,  swelling, drainage, or foul odor at incision (surgical) sites If your incisions open or pull apart Swelling or pain in calf (lower leg) Diarrhea (Loose bowel movements that happen often), frequent watery, uncontrolled bowel movements Constipation, (no bowel movements for 3 days) if this happens: Take Milk of Magnesia, 2 tablespoons by mouth, 3 times a day for 2 days if needed Stop taking Milk of Magnesia once you have had a bowel movement Call your doctor if constipation continues Or Take Miralax  (instead of Milk of Magnesia) following the label instructions Stop  taking Miralax once you have had a bowel movement Call your doctor if constipation continues Anything you think is abnormal for you   Normal side effects after surgery: Unable to sleep at night or unable to concentrate Irritability Being tearful (crying) or depressed  These are common complaints, possibly related to your anesthesia, stress of surgery, and change in lifestyle, that usually go away a few weeks after surgery. If these feelings continue, call your medical doctor.  Wound Care: You may have  surgical glue, steri-strips, or staples over your incisions after surgery Surgical glue: Looks like clear film over your incisions and will wear off a little at a time Steri-strips: Adhesive strips of tape over your incisions. You may notice a yellowish color on skin under the steri-strips. This is used to make the steri-strips stick better. Do not pull the steri-strips off - let them fall off Staples: Staples may be removed before you leave the hospital If you go home with staples, call [facility] for an appointment with your surgeons nurse to have staples removed 10 days after surgery, [phone] Showering: You may shower two (2) days after your surgery unless your surgeon tells you differently Wash gently around incisions with warm soapy water, rinse well, and gently pat dry If you have a drain (tube from your incision), you may need someone to hold this while you shower No tub baths until staples are removed and incisions are healed   Medications: Medications should be liquid or crushed if larger than the size of a dime Extended release pills (medication that releases a little bit at a time through the  day) should not be crushed Depending on the size and number of medications you take, you may need to space (take a few throughout the day)/change the time you take your medications so that you do not over-fill your pouch (smaller stomach) Make sure you follow-up with you primary care physician to make medication changes needed during rapid weight loss and life -style changes If you have diabetes, follow up with your doctor that orders your diabetes medication(s) within one week after surgery and check your blood sugar regularly  Do not drive while taking narcotics (pain medications)  Do not take acetaminophen (Tylenol) and Roxicet or Lortab Elixir at the same time since these pain medications contain acetaminophen   Diet:  First 2 Weeks You will see the nutritionist about  two (2) weeks after your surgery. The nutritionist will increase the types of foods you can eat if you are handling liquids well: If you have severe vomiting or nausea and cannot handle clear liquids lasting longer than 1 day call your surgeon For Same Day Surgery Discharge Patients: The day of surgery drink water only: 2 ounces every 4 hours If you are handling water, start drinking your high protein shake the next morning For Overnight Stay Patients: Begin by drinking 2 ounces of a high protein every 3 hours, 5-6 times per day Slowly increase the amount you drink as tolerated You may find it easier to slowly sip shakes throughout the day. It is important to get your proteins in first    Protein Shake Drink at least 2 ounces of shake 5-6 times per day Each serving of protein shakes (usually 8-12 ounces) should have a minimum of: 15 grams of protein And no more than 5 grams of carbohydrate Goal for protein each day: Men = 80 grams per day Women = 60 grams per day Protein powder may be  added to fluids such as non-fat milk or Lactaid milk or Soy milk (limit to 35 grams added protein powder per serving)  Hydration Slowly increase the amount of water and other clear liquids as tolerated (See Acceptable Fluids) Slowly increase the amount of protein shake as tolerated Sip fluids slowly and throughout the day May use sugar substitutes in small amounts (no more than 6-8 packets per day; i.e. Splenda)  Fluid Goal The first goal is to drink at least 8 ounces of protein shake/drink per day (or as directed by the nutritionist); some examples of protein shakes are Syntrax, Nectar, AMR Corporation, EAS Edge HP, and Unjury. - See handout from pre-op Bariatric Education Class: Slowly increase the amount of protein shake you drink as tolerated You may find it easier to slowly sip shakes throughout the day It is important to get your proteins in first Your fluid goal is to drink 64-100 ounces of fluid  daily It may take a few weeks to build up to this  32 oz. (or more) should be full liquids (see below for examples) Liquids should not contain sugar, caffeine, or carbonation  Clear Liquids: Water of Sugar-free flavored water (i.e. Fruit HO, Propel) Decaffeinated coffee or tea (sugar-free) Crystal lite, Wylers Lite, Minute Maid Lite Sugar-free Jell-O Bouillon or broth Sugar-free Popsicle:    - Less than 20 calories each; Limit 1 per day        Full Liquids:                   Protein Shakes/Drinks + 2 choices per day of other full liquids Full liquids must be: No More Than 12 grams of Carbs per serving No More Than 3 grams of Fat per serving Strained low-fat cream soup Non-Fat milk Fat-free Lactaid Milk Sugar-free yogurt (Dannon Lite & Fit, Greek yogurt)   Vitamins and Minerals Start 1 day after surgery unless otherwise directed by your surgeon 1 Chewable Multivitamin / Multimineral Supplement with iron (i.e. Centrum for Adults) Chewable Calcium Citrate with Vitamin D-3 (Example: 3 Chewable Calcium  Plus 600 with vitamin D-3) Take 500 mg three (3) times a day for a total of 1500 mg per day Do not take all 3 doses of calcium at one time as it may cause constipation, and you can only absorb 500 mg at a time Do not mix multivitamins containing iron with calcium supplements;  take 2 hours apart Do not substitute Tums (calcium carbonate) for your calcium Menstruating women and those at risk for anemia ( a blood disease that causes weakness) may need extra iron Talk to your doctor to see if you need more iron If you need extra iron: total daily iron recommendation (including Vitamins) is 50 to 100 mg Iron/day Do not stop taking or change any vitamins or minerals until you talk to your nutritionist or surgeon Your nutritionist and/or surgeon must approve all vitamin and mineral supplements  Activity and Exercise: It is important to continue walking at home. Limit your physical  activity as instructed by your doctor. During this time, use these guidelines: Do not lift anything greater than ten  (10) pounds for at least two (2) weeks Do not go back to work or drive until Engineer, production says you can You may have sex when you feel comfortable It is VERY important for male patients to use a reliable birth control method; fertility often increase after surgery Do not get pregnant for at least 18 months Start exercising as soon as your  doctor tells you that you can Make sure your doctor approves any physical activity Start with a simple walking program Walk 5-15 minutes each day, 7 days per week Slowly increase until you are walking 30-45 minutes per day Consider joining our [place] program. [phone] or email belt@[employer] .edu    Special Instructions  Things to remember: Free counseling is available for you and your family through collaboration between [facility] and [place]. Please call [phone] and leave a message Use your CPAP when sleeping if this applies to you Consider buying a medical alert bracelet that says you had lap-band surgery You will likely have your first fill (fluid added to your band) 6 - 8 weeks after surgery [facility] has a free Bariatric Surgery Support Group that meets monthly, the 3rd Thursday, [place]. You can see classes online at [email or web address] It is very important to keep all follow up appointments with your surgeon, nutritionist, primary care physician, and behavioral health practitioner After the first year, please follow up with your bariatric surgeon and nutritionist at least once a year in order to maintain best weight loss results                    [facility]:  [place]: [phone]               Bariatric Nurse Coordinator: [phone]      Adjustable Gastric Band Home Care  Instructions  Rev. 09/2012                                                                  Reviewed and Endorsed                                                   by [facility] Patient Education Committee, Jan, 2014

## 2013-11-27 ENCOUNTER — Telehealth (HOSPITAL_COMMUNITY): Payer: Self-pay

## 2013-11-27 NOTE — Telephone Encounter (Signed)
Made discharge phone call to patient per DROP protocol. Asking the following questions.    1. Do you have someone to care for you now that you are home? yes  2. Are you having pain now that is not relieved by your pain medication?  no 3. Are you able to drink the recommended daily amount of fluids (48 ounces minimum/day) and protein (60-80 grams/day) as prescribed by the dietitian or nutritional counselor?  yes 4. Are you taking the vitamins and minerals as prescribed?  yes 5. Do you have the "on call" number to contact your surgeon if you have a problem or question?  yes 6. Are your incisions free of redness, swelling or drainage? (If steri strips, address that these can fall off, shower as tolerated) yes 7. Have your bowels moved since your surgery?  If not, are you passing gas?  No, yes 8. Are you up and walking 3-4 times per day?  yes 9. Do you have an appointment to see a dietitian or nutritional counselor in the next month? yes    The following questions can be added at the center's discharge phone call script at the center's discretion.  The questions are also captured within [employer] project custom fields. 1. Do you have an appointment made to see your surgeon in the next month?  Not yet, but calling tomorrow 2. Were you provided your discharge medications before your surgery or before you were discharged from the hospital and are you taking them without problem?  yes 3. Were you provided phone numbers to the clinic/surgeon's office?  yes 4. Did you watch the patient education video module in the (clinic, surgeon's office, etc.) before your surgery? yes 5. Do you have a discharge checklist that was provided to you in the hospital to reference with instructions on how to take care of yourself after surgery?  yes 6. Did you see a dietitian or nutritional counselor while you were in the hospital?  yes

## 2013-11-28 ENCOUNTER — Telehealth (INDEPENDENT_AMBULATORY_CARE_PROVIDER_SITE_OTHER): Payer: Self-pay

## 2013-11-28 NOTE — Telephone Encounter (Signed)
Called patient with post op appointment date & time for 12/04/13 @ 10:50am w/Dr. Hassell Done

## 2013-11-28 NOTE — Telephone Encounter (Signed)
Message copied by Ivor Costa on Fri Nov 28, 2013  1:48 PM ------      Message from: Jackquline Denmark      Created: Fri Nov 28, 2013 11:18 AM       I received this email from the patient through epic, pt needs a 2wk po lap band appt, but I couldn't find anything, can you work him in?  This the email I received for the days and time of day he can come in.  When you schedule let me know and I can reply or unless you have access to it, which ever works for you.            From: Robert Cordova         Sent: 11/28/2013  7:35 AM EDT         To: [name]         Subject: RE: Appointment Request                   What kind of appointment do you need, is this a follow up after surgery?                   ----- Message -----            FromCordelia Poche            Sent: 11/27/2013  5:40 PM EDT              To: Patient Appointment Schedule Request Mailing List         Subject: Appointment Request                   Appointment Request From: [name]                   With Provider: [name], MD [facility]]                   Preferred Date Range: From 12/10/2013 To 12/12/2013                   Preferred Times: Wednesday Morning, Thursday Morning, Friday Morning, Wednesday Afternoon, Thursday Afternoon, Friday Afternoon                   Reason for visit: Office Visit        ------

## 2013-12-02 ENCOUNTER — Encounter (INDEPENDENT_AMBULATORY_CARE_PROVIDER_SITE_OTHER): Payer: BC Managed Care – PPO | Admitting: Surgery

## 2013-12-04 ENCOUNTER — Ambulatory Visit (INDEPENDENT_AMBULATORY_CARE_PROVIDER_SITE_OTHER): Payer: BC Managed Care – PPO | Admitting: Surgery

## 2013-12-04 ENCOUNTER — Encounter (INDEPENDENT_AMBULATORY_CARE_PROVIDER_SITE_OTHER): Payer: Self-pay | Admitting: Surgery

## 2013-12-04 NOTE — Progress Notes (Signed)
Chanson Zalar 63 y.o.  Body mass index is 34.38 kg/(m^2).  Patient Active Problem List   Diagnosis Date Noted  . Lapband APS + hiatus hernia repair April 2015 11/25/2013  . Obesity 11/25/2013  . OSA (obstructive sleep apnea) 07/08/2013  . Cough 06/16/2013  . Goiter 04/18/2013  . Obesity, unspecified 04/10/2013  . Osteoarthritis 07/26/2011  . Plantar fasciitis 07/26/2011  . Diabetes mellitus type 1, uncontrolled, insulin dependent 07/26/2011  . BLEPHARITIS, LEFT 10/29/2009  . NEVUS, MELANOCYTIC, FACE 10/07/2009  . HYPERLIPIDEMIA 10/22/2007  . HEMATURIA UNSPECIFIED 08/09/2007  . HYPERTENSION 03/18/2007  . NECK PAIN, CHRONIC 03/18/2007    Allergies  Allergen Reactions  . Ace Inhibitors     REACTION: Coughing    Past Surgical History  Procedure Laterality Date  . Colonoscopy  2009, 2013    Dr. Sharlett Iles; multiple polyps  . Biopsy thyroid  ~08/2013  . Breath tek h pylori N/A 09/05/2013    Procedure: BREATH TEK H PYLORI;  Surgeon: [name], MD;  Location: [facility];  Service: General;  Laterality: N/A;  . No past surgeries    . Laparoscopic gastric banding N/A 11/25/2013    Procedure: LAPAROSCOPIC GASTRIC BANDING;  Surgeon: [name], MD;  Location: [facility];  Service: General;  Laterality: N/A;  . Hiatal hernia repair  11/25/2013    Procedure: LAPAROSCOPIC REPAIR OF HIATAL HERNIA;  Surgeon: [name], MD;  Location: [facility];  Service: General;;   Joycelyn Man, MD No diagnosis found.  Has lost 24 lbs thus far since lapband + HH repair.  To meet with nutritionist next week.  Will do first fill in 4 weeks.  Incisions OK.   Matt B. Hassell Done, MD, [facility] [phone] beeper [phone]  12/04/2013 11:41 AM

## 2013-12-05 ENCOUNTER — Telehealth (INDEPENDENT_AMBULATORY_CARE_PROVIDER_SITE_OTHER): Payer: Self-pay | Admitting: Surgery

## 2013-12-06 ENCOUNTER — Other Ambulatory Visit: Payer: Self-pay | Admitting: Family Medicine

## 2013-12-09 ENCOUNTER — Encounter: Payer: Self-pay | Admitting: Family Medicine

## 2013-12-09 ENCOUNTER — Encounter: Payer: BC Managed Care – PPO | Attending: Surgery

## 2013-12-09 ENCOUNTER — Ambulatory Visit (INDEPENDENT_AMBULATORY_CARE_PROVIDER_SITE_OTHER): Payer: BC Managed Care – PPO | Admitting: Family Medicine

## 2013-12-09 VITALS — BP 140/90 | Temp 97.7°F | Wt 239.0 lb

## 2013-12-09 VITALS — Ht 70.0 in | Wt 235.5 lb

## 2013-12-09 DIAGNOSIS — I10 Essential (primary) hypertension: Secondary | ICD-10-CM

## 2013-12-09 DIAGNOSIS — E669 Obesity, unspecified: Secondary | ICD-10-CM

## 2013-12-09 DIAGNOSIS — Z713 Dietary counseling and surveillance: Secondary | ICD-10-CM | POA: Insufficient documentation

## 2013-12-09 DIAGNOSIS — E119 Type 2 diabetes mellitus without complications: Secondary | ICD-10-CM

## 2013-12-09 DIAGNOSIS — G4733 Obstructive sleep apnea (adult) (pediatric): Secondary | ICD-10-CM

## 2013-12-09 DIAGNOSIS — E785 Hyperlipidemia, unspecified: Secondary | ICD-10-CM

## 2013-12-09 NOTE — Patient Instructions (Signed)
Fasting labs sometime in the next 4-6 weeks  Return 1 week after lab work for followup office visit  Check a fasting blood sugar and her blood pressure Monday Wednesday Friday  Stopped the Lipitor

## 2013-12-09 NOTE — Progress Notes (Signed)
  Bariatric Class:  Appt start time: 1530 end time:  1630.  2 Week Post-Operative Nutrition Class  Patient was seen on 12/09/2013 for Post-Operative Nutrition education at [facility].  Surgery date: 11/25/2013 Surgery type: LAGB Start weight at [facility]: 259 on 09/01/13 Weight today: 235.5 lbs Weight change: 15.5 lbs  TANITA  BODY COMP RESULTS  11/17/13 12/09/13   BMI (kg/m^2) 36 33.8   Fat Mass (lbs) 85.5 71.0   Fat Free Mass (lbs) 165.5 164.5   Total Body Water (lbs) 121 120.5   The following the learning objectives were met by the patient during this course:  Identifies Phase 3A (Soft, High Proteins) Dietary Goals and will begin from 2 weeks post-operatively to 2 months post-operatively  Identifies appropriate sources of fluids and proteins   States protein recommendations and appropriate sources post-operatively  Identifies the need for appropriate texture modifications, mastication, and bite sizes when consuming solids  Identifies appropriate multivitamin and calcium sources post-operatively  Describes the need for physical activity post-operatively and will follow MD recommendations  States when to call healthcare provider regarding medication questions or post-operative complications  Handouts given during class include:  Phase 3A: Soft, High Protein Diet Handout  Band Fill Guidelines Handout  Follow-Up Plan: Patient will follow-up at [facility] in 4 weeks for 6 week post-op nutrition visit for diet advancement per MD.

## 2013-12-09 NOTE — Patient Instructions (Signed)
Patient to follow Phase 3A-Soft, High Protein Diet and follow-up at [facility] in 6 weeks for 2 months post-op nutrition visit for diet advancement. 

## 2013-12-09 NOTE — Progress Notes (Signed)
Pre visit review using our clinic review tool, if applicable. No additional management support is needed unless otherwise documented below in the visit note. 

## 2013-12-09 NOTE — Progress Notes (Signed)
   Subjective:    Patient ID: [name], male    DOB: [birth year], 63 y.o.   MRN: [id number]  HPI Robert Cordova is a 63 year old married male nonsmoker who comes in today for hospital followup  He has a history of underlying hypertension hyperlipidemia diabetes and obesity and underwent a LAP-BAND APS and her heart repair on 11/25/2013  Surgery was done by Dr. Kaylyn Lim. No complications. Since that time he's done well he's lost about 30 pounds and has stopped all his antihypertensive medication and all his diabetes medication. His fasting blood sugars at home are in the 100-120 range and blood pressures home are 125-75 range. He continues oxygen under the direction of Dr. Lanny Hurst because of nighttime hypoxia. Was to have a hernia repair he's not taking Prilosec anymore either.  Question use since he stands well does he really is in need a statin anymore.   Review of Systems    review of systems otherwise negative Objective:   Physical Exam  Well-developed well-nourished male no acute distress vital signs stable he is afebrile weight today 239 pounds BP 140/90 right arm sitting position      Assessment & Plan:  Plastic metabolic syndrome much improved with LAP-BAND surgery...........[name] monitor blood sugar and blood pressure 3 times weekly stopped the statin followup lipid panel in office visit in 4 weeks

## 2013-12-10 ENCOUNTER — Telehealth: Payer: Self-pay | Admitting: Family Medicine

## 2013-12-10 NOTE — Telephone Encounter (Signed)
Relevant patient education assigned to patient using Emmi. ° °

## 2013-12-26 ENCOUNTER — Telehealth: Payer: Self-pay | Admitting: Family Medicine

## 2013-12-26 NOTE — Telephone Encounter (Signed)
Yes patient can have labs at [facility].  Let me know if orders need to be placed.

## 2013-12-26 NOTE — Telephone Encounter (Signed)
Pt has 3 other lab appts the same day as his labs were scheduled here on [facility].. Is it ok for pt to go to [facility]? It looks like the orfers are in Epic already?

## 2013-12-26 NOTE — Telephone Encounter (Signed)
They are already in the system, I guess that's all [facility] needs. Thank you!!

## 2014-01-02 ENCOUNTER — Ambulatory Visit (INDEPENDENT_AMBULATORY_CARE_PROVIDER_SITE_OTHER): Payer: BC Managed Care – PPO | Admitting: Surgery

## 2014-01-02 VITALS — BP 122/78 | HR 78 | Temp 98.1°F | Resp 16 | Ht 70.0 in | Wt 234.8 lb

## 2014-01-02 DIAGNOSIS — Z4651 Encounter for fitting and adjustment of gastric lap band: Secondary | ICD-10-CM

## 2014-01-02 NOTE — Progress Notes (Signed)
Lapband Fill Encounter Problem List:   Patient Active Problem List   Diagnosis Date Noted  . Lapband APS + hiatus hernia repair April 2015 11/25/2013  . Obesity 11/25/2013  . OSA (obstructive sleep apnea) 07/08/2013  . Cough 06/16/2013  . Goiter 04/18/2013  . Obesity, unspecified 04/10/2013  . Osteoarthritis 07/26/2011  . Plantar fasciitis 07/26/2011  . Diabetes mellitus type 1, uncontrolled, insulin dependent 07/26/2011  . BLEPHARITIS, LEFT 10/29/2009  . NEVUS, MELANOCYTIC, FACE 10/07/2009  . HYPERLIPIDEMIA 10/22/2007  . HEMATURIA UNSPECIFIED 08/09/2007  . HYPERTENSION 03/18/2007  . NECK PAIN, CHRONIC 03/18/2007    Makari Alkema Body mass index is 33.69 kg/(m^2). Weight loss since surgery  29  Having regurgitation?:  no  Feel that they need a fill?  unsure  Nocturnal reflux?  no  Amount of fill  1     Instructions given and weight loss goals discussed.    This is his first fill.  He could tell that he was restricted after this fill.   Return.  4-6 weeks  Matt B. Hassell Done, MD, FACS

## 2014-01-02 NOTE — Patient Instructions (Signed)

## 2014-01-13 ENCOUNTER — Other Ambulatory Visit: Payer: BC Managed Care – PPO

## 2014-01-13 ENCOUNTER — Other Ambulatory Visit (INDEPENDENT_AMBULATORY_CARE_PROVIDER_SITE_OTHER): Payer: BC Managed Care – PPO

## 2014-01-13 ENCOUNTER — Encounter: Payer: BC Managed Care – PPO | Attending: Surgery | Admitting: Dietician

## 2014-01-13 ENCOUNTER — Ambulatory Visit: Payer: BC Managed Care – PPO

## 2014-01-13 VITALS — Ht 70.0 in | Wt 231.5 lb

## 2014-01-13 DIAGNOSIS — E785 Hyperlipidemia, unspecified: Secondary | ICD-10-CM

## 2014-01-13 DIAGNOSIS — Z713 Dietary counseling and surveillance: Secondary | ICD-10-CM | POA: Insufficient documentation

## 2014-01-13 DIAGNOSIS — E119 Type 2 diabetes mellitus without complications: Secondary | ICD-10-CM

## 2014-01-13 DIAGNOSIS — I10 Essential (primary) hypertension: Secondary | ICD-10-CM

## 2014-01-13 DIAGNOSIS — E669 Obesity, unspecified: Secondary | ICD-10-CM | POA: Insufficient documentation

## 2014-01-13 LAB — BASIC METABOLIC PANEL
BUN: 21 mg/dL (ref 6–23)
CHLORIDE: 106 meq/L (ref 96–112)
CO2: 27 meq/L (ref 19–32)
Calcium: 10 mg/dL (ref 8.4–10.5)
Creatinine, Ser: 0.8 mg/dL (ref 0.4–1.5)
GFR: 98.95 mL/min (ref >60.00)
GLUCOSE: 119 mg/dL — AB (ref 70–99)
Potassium: 4.5 mEq/L (ref 3.5–5.1)
SODIUM: 139 meq/L (ref 135–145)

## 2014-01-13 LAB — CBC WITH DIFFERENTIAL/PLATELET
BASOS PCT: 0.9 % (ref 0.0–3.0)
Basophils Absolute: 0.1 10*3/uL (ref 0.0–0.1)
EOS PCT: 1.6 % (ref 0.0–5.0)
Eosinophils Absolute: 0.1 10*3/uL (ref 0.0–0.7)
HEMATOCRIT: 41.6 % (ref 39.0–52.0)
Hemoglobin: 14.1 g/dL (ref 13.0–17.0)
Lymphocytes Relative: 32.1 % (ref 12.0–46.0)
Lymphs Abs: 2.1 10*3/uL (ref 0.7–4.0)
MCHC: 34 g/dL (ref 30.0–36.0)
MCV: 91.5 fl (ref 78.0–100.0)
MONOS PCT: 10.4 % (ref 3.0–12.0)
Monocytes Absolute: 0.7 10*3/uL (ref 0.1–1.0)
NEUTROS PCT: 55 % (ref 43.0–77.0)
Neutro Abs: 3.6 10*3/uL (ref 1.4–7.7)
PLATELETS: 306 10*3/uL (ref 150.0–400.0)
RBC: 4.55 Mil/uL (ref 4.22–5.81)
RDW: 13.1 % (ref 11.5–15.5)
WBC: 6.5 10*3/uL (ref 4.0–10.5)

## 2014-01-13 LAB — LIPID PANEL
CHOL/HDL RATIO: 5
Cholesterol: 173 mg/dL (ref 0–200)
HDL: 36.6 mg/dL — AB (ref >39.00)
LDL Cholesterol: 108 mg/dL — ABNORMAL HIGH (ref 0–99)
NONHDL: 136.4
TRIGLYCERIDES: 144 mg/dL (ref 0.0–149.0)
VLDL: 28.8 mg/dL (ref 0.0–40.0)

## 2014-01-13 LAB — HEMOGLOBIN A1C: Hgb A1c MFr Bld: 6.2 % (ref 4.6–6.5)

## 2014-01-13 NOTE — Progress Notes (Signed)
  Follow-up visit:  6 Weeks Post-Operative LAGB Surgery  Medical Nutrition Therapy:  Appt start time: 1130 end time:  1200.  Primary concerns today: Post-operative Bariatric Surgery Nutrition Management.  Surgery date: 11/25/2013 Surgery type: LAGB Start weight at [facility]: 259 on 09/01/13 Weight today: 231.5 Weight change: 4 lbs Total weight lost: 27.5 lbs  TANITA  BODY COMP RESULTS  11/17/13 12/09/13 01/13/14   BMI (kg/m^2) 36 33.8 33.2   Fat Mass (lbs) 85.5 71.0 76.5   Fat Free Mass (lbs) 165.5 164.5 155   Total Body Water (lbs) 121 120.5 113.5    Preferred Learning Style:  No preference indicated   Learning Readiness:   Ready  24-hr recall: B (AM): whey shake (60g) Snk ( AM):   L (PM): 2-3 scrambled eggs, sometimes with cheese (14-21g) Snk ( PM): [place] jerky (7g)  D (PM): 4-5 oz fish with green beans or squash (28g)  Snk (PM):   Fluid intake: at least 64 oz per patient report; protein shake with FF half and half, water with sf packets, unsweeted tea with Splenda Estimated total protein intake: 80+ grams per day  Medications: no longer on insulin or BP meds Supplementation: taking; Calcium inconsistent  CBG monitoring: every morning Average CBG per patient: 130-140 mg/dL Last patient reported A1c: labs drawn today  Using straws: no Drinking while eating: occasionally Hair loss: none Carbonated beverages: tried some but let it go flat first N/V/D/C: vomiting when he drinks while eating Last Lap-Band fill: 1cc 01/02/2014  Recent physical activity:  Walking 3-4x a week for 20-30 min  Progress Towards Goal(s):  In progress.  Handouts given during visit include:  Phase 3B lean protein + non-starchy vegetables   Nutritional Diagnosis:  [place]-3.3 Overweight/obesity related to past poor dietary habits and physical inactivity as evidenced by patient w/ recent LAGB surgery following dietary guidelines for continued weight loss.     Intervention:  Nutrition counseling  provided.  Teaching Method Utilized:  Visual Auditory  Barriers to learning/adherence to lifestyle change: none  Demonstrated degree of understanding via:  Teach Back   Monitoring/Evaluation:  Dietary intake, exercise, lap band fills, and body weight. Follow up in 1 months for 2.5 month post-op visit.

## 2014-01-21 ENCOUNTER — Ambulatory Visit (INDEPENDENT_AMBULATORY_CARE_PROVIDER_SITE_OTHER): Payer: BC Managed Care – PPO | Admitting: Family Medicine

## 2014-01-21 ENCOUNTER — Encounter: Payer: Self-pay | Admitting: Family Medicine

## 2014-01-21 VITALS — BP 110/80 | Temp 98.4°F | Wt 234.0 lb

## 2014-01-21 DIAGNOSIS — E119 Type 2 diabetes mellitus without complications: Secondary | ICD-10-CM

## 2014-01-21 DIAGNOSIS — E785 Hyperlipidemia, unspecified: Secondary | ICD-10-CM

## 2014-01-21 DIAGNOSIS — G4733 Obstructive sleep apnea (adult) (pediatric): Secondary | ICD-10-CM

## 2014-01-21 DIAGNOSIS — I10 Essential (primary) hypertension: Secondary | ICD-10-CM

## 2014-01-21 MED ORDER — LOSARTAN POTASSIUM 25 MG PO TABS: ORAL_TABLET | ORAL | Status: DC

## 2014-01-21 NOTE — Patient Instructions (Signed)
Since your blood sugar is normal I would just check it once weekly  Since your blood pressure is normal I would also check it once weekly  Continue diet and exercise program  Take the oral iron and B12 for one more month then stop  Continue to avoid aspirin and aspirin products  Followup in 3 months  Labs one week prior  Cozaar 25 mg........... one half tablet in the morning for long-term renal protection  Decrease the metformin to 500 mg once daily

## 2014-01-21 NOTE — Progress Notes (Signed)
Pre visit review using our clinic review tool, if applicable. No additional management support is needed unless otherwise documented below in the visit note. 

## 2014-01-21 NOTE — Progress Notes (Signed)
   Subjective:    Patient ID: Robert Cordova, male    DOB: 1951-06-01, 63 y.o.   MRN: [id number]  HPI Robert Cordova is a 63 year old male who comes in today for followup of diabetes hypertension hyperlipidemia  In the spring of 2015 he had a laparoscopic banding. Since that time he's lost weight. He's had a 234 pounds. Also his blood pressure is normal off on any hypertensive medication  Also lipids recently done LDL 108 off the Lipitor  Also off all his medication for diabetes except metformin 500 mg twice a day his A1c has dropped from 8.3% down to 6.2%. No hypoglycemia  He also has a history of sleep apnea and is followed by Dr. Lanny Cordova and pulmonary. He still uses the oxygen at bedtime  Overall he is having a meeting outcome from the gastric banding surgery.  He's off all aspirin products and has been taking iron and oral B12. Hemoglobin has risen from 11.8 in March now 14.1     Review of Systems Review of systems negative    Objective:   Physical Exam  Well-developed well nourished male no acute distress vital signs stable he is afebrile BP 110/80      Assessment & Plan:  Diabetes at goal,,,,,, decrease metformin to 500 mg daily followup A1c in 6 months  Hypertension at goal of medications  Hyperlipidemia goal of medication  Recommend 12.5 mg of the Cozaar for renal protection ,,,,,,, ACE inhibitor gave him a cough  Also his hemoglobin was 11.9 in April amounts up to 14.1. ,,,,,,,,,,,,

## 2014-02-05 ENCOUNTER — Encounter (INDEPENDENT_AMBULATORY_CARE_PROVIDER_SITE_OTHER): Payer: Self-pay | Admitting: Surgery

## 2014-02-05 ENCOUNTER — Ambulatory Visit (INDEPENDENT_AMBULATORY_CARE_PROVIDER_SITE_OTHER): Payer: BC Managed Care – PPO | Admitting: Surgery

## 2014-02-05 VITALS — BP 126/80 | HR 74 | Ht 70.0 in | Wt 229.0 lb

## 2014-02-05 DIAGNOSIS — Z4651 Encounter for fitting and adjustment of gastric lap band: Secondary | ICD-10-CM

## 2014-02-05 NOTE — Patient Instructions (Signed)
Thanks for your patience.  If you need further assistance after leaving the office, please call our office and speak with a [facility] nurse.  [phone].  If you want to leave a message for Dr. Zylee Marchiano, please call his office phone at [phone]. 

## 2014-02-05 NOTE — Progress Notes (Signed)
Lapband Fill Encounter Problem List:   Patient Active Problem List   Diagnosis Date Noted  . Lapband APS + hiatus hernia repair April 2015 11/25/2013  . Obesity 11/25/2013  . OSA (obstructive sleep apnea) 07/08/2013  . Cough 06/16/2013  . Goiter 04/18/2013  . Obesity, unspecified 04/10/2013  . Osteoarthritis 07/26/2011  . Plantar fasciitis 07/26/2011  . BLEPHARITIS, LEFT 10/29/2009  . NEVUS, MELANOCYTIC, FACE 10/07/2009  . HYPERLIPIDEMIA 10/22/2007  . HEMATURIA UNSPECIFIED 08/09/2007  . HYPERTENSION 03/18/2007  . NECK PAIN, CHRONIC 03/18/2007    Robert Cordova Body mass index is 32.86 kg/(m^2). Weight loss since surgery  33 lbs  Having regurgitation?:  occasionally  Feel that they need a fill?  no  Nocturnal reflux?  no  Amount of fill  0     Instructions given and weight loss goals discussed.    GERD is gone and that was taken care of by hiatus hernia repair.  He is losing about a pound a week.  Feeling restriction and eating less.  Will not fill today.    Robert B. Hassell Done, MD, FACS

## 2014-02-27 LAB — HM DIABETES EYE EXAM

## 2014-03-02 ENCOUNTER — Encounter: Payer: BC Managed Care – PPO | Attending: Surgery | Admitting: Dietician

## 2014-03-02 VITALS — Ht 70.0 in | Wt 225.0 lb

## 2014-03-02 DIAGNOSIS — E669 Obesity, unspecified: Secondary | ICD-10-CM | POA: Insufficient documentation

## 2014-03-02 DIAGNOSIS — Z713 Dietary counseling and surveillance: Secondary | ICD-10-CM | POA: Insufficient documentation

## 2014-03-02 NOTE — Progress Notes (Signed)
  Follow-up visit:  3 Months Post-Operative LAGB Surgery  Medical Nutrition Therapy:  Appt start time: 200 end time:  230.  Primary concerns today: Post-operative Bariatric Surgery Nutrition Management. Robert Cordova returns today reporting that he has been feeling well and meeting his protein and fluid needs. He declined a band fill at his appointment a few weeks ago due to some regurgitation.   Surgery date: 11/25/2013 Surgery type: LAGB Start weight at [facility]: 259 on 09/01/13 Weight today: 225 lbs Weight change: 6.5 lbs Total weight lost: 34 lbs  TANITA  BODY COMP RESULTS  11/17/13 12/09/13 01/13/14 03/02/14   BMI (kg/m^2) 36 33.8 33.2 32.3   Fat Mass (lbs) 85.5 71.0 76.5 67.5   Fat Free Mass (lbs) 165.5 164.5 155 157.5   Total Body Water (lbs) 121 120.5 113.5 115.5    Preferred Learning Style:  No preference indicated   Learning Readiness:   Ready  24-hr recall: B (AM): 2-3 eggs with low fat cheese or shake (12-18g) Snk ( AM):   L (PM): chicken or lunch meat (14-21g) Snk ( PM):  SF jello and popsicles, [place] jerky D (PM): 4-5 oz fish or pork chops or steak or shrimp (28 g) Snk (PM): sugar free pudding  Fluid intake: at least 64 oz per patient report; protein shake with FF half and half, water with SF packets, unsweeted tea with Splenda Estimated total protein intake: 80+ grams per day  Medications: Metformin and BP meds; no longer on insulin or cholesterol meds Supplementation: taking; Calcium inconsistent  CBG monitoring: 1x a week in the morning Average CBG per patient: 110-145 mg/dL Last patient reported A1c: 6.2%  Using straws: no Drinking while eating: occasionally Hair loss: none Carbonated beverages: tried once or twice N/V/D/C: vomiting when eating too big of a bite Last Lap-Band fill: no fills since May 2015  Recent physical activity:  "not enough" ; some walking  Progress Towards Goal(s):  In progress.  Handouts given during visit include:  Phase 3B lean  protein + non-starchy vegetables   Nutritional Diagnosis:  [place]-3.3 Overweight/obesity related to past poor dietary habits and physical inactivity as evidenced by patient w/ recent LAGB surgery following dietary guidelines for continued weight loss.     Intervention:  Nutrition counseling provided.  Teaching Method Utilized:  Visual Auditory  Barriers to learning/adherence to lifestyle change: none  Demonstrated degree of understanding via:  Teach Back   Monitoring/Evaluation:  Dietary intake, exercise, lap band fills, and body weight. Follow up in 3 months for 6 month post-op visit.

## 2014-03-02 NOTE — Patient Instructions (Addendum)
-  Work on consistency with supplements (keep in car, work, Social research officer, government) -[email or web address] (fill out registration page) -Try Flintstones Complete  -Try additional Iron and B12 if needed  -Get back into exercise routine!    TANITA  BODY COMP RESULTS  11/17/13 12/09/13 01/13/14 03/02/14   BMI (kg/m^2) 36 33.8 33.2 32.3   Fat Mass (lbs) 85.5 71.0 76.5 67.5   Fat Free Mass (lbs) 165.5 164.5 155 157.5   Total Body Water (lbs) 121 120.5 113.5 115.5

## 2014-03-03 ENCOUNTER — Encounter: Payer: Self-pay | Admitting: Family Medicine

## 2014-03-25 ENCOUNTER — Encounter (INDEPENDENT_AMBULATORY_CARE_PROVIDER_SITE_OTHER): Payer: Self-pay | Admitting: Surgery

## 2014-03-25 ENCOUNTER — Ambulatory Visit (INDEPENDENT_AMBULATORY_CARE_PROVIDER_SITE_OTHER): Payer: BC Managed Care – PPO | Admitting: Surgery

## 2014-03-25 VITALS — BP 126/78 | HR 80 | Ht 70.0 in | Wt 229.6 lb

## 2014-03-25 DIAGNOSIS — Z4651 Encounter for fitting and adjustment of gastric lap band: Secondary | ICD-10-CM

## 2014-03-25 NOTE — Progress Notes (Signed)
Lapband Fill Encounter Problem List:   Patient Active Problem List   Diagnosis Date Noted  . Lapband APS + hiatus hernia repair April 2015 11/25/2013  . Obesity 11/25/2013  . OSA (obstructive sleep apnea) 07/08/2013  . Cough 06/16/2013  . Goiter 04/18/2013  . Obesity, unspecified 04/10/2013  . Osteoarthritis 07/26/2011  . Plantar fasciitis 07/26/2011  . BLEPHARITIS, LEFT 10/29/2009  . NEVUS, MELANOCYTIC, FACE 10/07/2009  . HYPERLIPIDEMIA 10/22/2007  . HEMATURIA UNSPECIFIED 08/09/2007  . HYPERTENSION 03/18/2007  . NECK PAIN, CHRONIC 03/18/2007    Robert Cordova Body mass index is 32.94 kg/(m^2). Weight loss since surgery  33  Having regurgitation?:  no  Feel that they need a fill?  yes  Nocturnal reflux?  no  Amount of fill  0.5     Instructions given and weight loss goals discussed.    He is going to [place] in early Sept.  Will keep at this fill until his return and will probably give larger fill.   Matt B. Hassell Done, MD, FACS

## 2014-03-25 NOTE — Patient Instructions (Signed)

## 2014-04-16 ENCOUNTER — Other Ambulatory Visit: Payer: BC Managed Care – PPO

## 2014-04-23 ENCOUNTER — Ambulatory Visit: Payer: BC Managed Care – PPO | Admitting: Family Medicine

## 2014-04-30 ENCOUNTER — Other Ambulatory Visit (INDEPENDENT_AMBULATORY_CARE_PROVIDER_SITE_OTHER): Payer: BC Managed Care – PPO

## 2014-04-30 DIAGNOSIS — D649 Anemia, unspecified: Secondary | ICD-10-CM

## 2014-04-30 DIAGNOSIS — IMO0001 Reserved for inherently not codable concepts without codable children: Secondary | ICD-10-CM

## 2014-04-30 DIAGNOSIS — E1165 Type 2 diabetes mellitus with hyperglycemia: Principal | ICD-10-CM

## 2014-04-30 LAB — BASIC METABOLIC PANEL
BUN: 17 mg/dL (ref 6–23)
CALCIUM: 9.4 mg/dL (ref 8.4–10.5)
CHLORIDE: 106 meq/L (ref 96–112)
CO2: 26 mEq/L (ref 19–32)
Creatinine, Ser: 0.8 mg/dL (ref 0.4–1.5)
GFR: 103.15 mL/min (ref >60.00)
GLUCOSE: 102 mg/dL — AB (ref 70–99)
POTASSIUM: 4.1 meq/L (ref 3.5–5.1)
SODIUM: 139 meq/L (ref 135–145)

## 2014-04-30 LAB — MICROALBUMIN / CREATININE URINE RATIO
CREATININE, U: 149.8 mg/dL
Microalb Creat Ratio: 0.2 mg/g (ref 0.0–30.0)
Microalb, Ur: 0.3 mg/dL (ref 0.0–1.9)

## 2014-04-30 LAB — CBC WITH DIFFERENTIAL/PLATELET
Basophils Absolute: 0 10*3/uL (ref 0.0–0.1)
Basophils Relative: 0.8 % (ref 0.0–3.0)
EOS ABS: 0.1 10*3/uL (ref 0.0–0.7)
Eosinophils Relative: 1.6 % (ref 0.0–5.0)
HEMATOCRIT: 41.7 % (ref 39.0–52.0)
HEMOGLOBIN: 14.1 g/dL (ref 13.0–17.0)
LYMPHS ABS: 1.9 10*3/uL (ref 0.7–4.0)
Lymphocytes Relative: 30 % (ref 12.0–46.0)
MCHC: 33.8 g/dL (ref 30.0–36.0)
MCV: 91.9 fl (ref 78.0–100.0)
Monocytes Absolute: 0.6 10*3/uL (ref 0.1–1.0)
Monocytes Relative: 9.4 % (ref 3.0–12.0)
NEUTROS ABS: 3.7 10*3/uL (ref 1.4–7.7)
Neutrophils Relative %: 58.2 % (ref 43.0–77.0)
Platelets: 283 10*3/uL (ref 150.0–400.0)
RBC: 4.54 Mil/uL (ref 4.22–5.81)
RDW: 13.1 % (ref 11.5–15.5)
WBC: 6.4 10*3/uL (ref 4.0–10.5)

## 2014-04-30 LAB — HEMOGLOBIN A1C: Hgb A1c MFr Bld: 6.4 % (ref 4.6–6.5)

## 2014-05-07 ENCOUNTER — Ambulatory Visit (INDEPENDENT_AMBULATORY_CARE_PROVIDER_SITE_OTHER): Payer: BC Managed Care – PPO | Admitting: Family Medicine

## 2014-05-07 ENCOUNTER — Encounter: Payer: Self-pay | Admitting: Family Medicine

## 2014-05-07 VITALS — BP 120/70 | Temp 98.2°F | Wt 226.0 lb

## 2014-05-07 DIAGNOSIS — I10 Essential (primary) hypertension: Secondary | ICD-10-CM

## 2014-05-07 DIAGNOSIS — Z23 Encounter for immunization: Secondary | ICD-10-CM

## 2014-05-07 DIAGNOSIS — E119 Type 2 diabetes mellitus without complications: Secondary | ICD-10-CM

## 2014-05-07 NOTE — Progress Notes (Signed)
Pre visit review using our clinic review tool, if applicable. No additional management support is needed unless otherwise documented below in the visit note. 

## 2014-05-07 NOTE — Progress Notes (Signed)
Lab Results  Component Value Date   HGBA1C 6.4 04/30/2014   HGBA1C 6.2 01/13/2014   HGBA1C 8.3* 04/04/2013   Lab Results  Component Value Date   MICROALBUR 0.3 04/30/2014   LDLCALC 108* 01/13/2014   CREATININE 0.8 04/30/2014

## 2014-05-07 NOTE — Patient Instructions (Signed)
Metformin 500 mg.......... one with breakfast......... and 1 with your evening meal  Fasting blood sugar weekly  Followup in 3 months for complete physical examination  Labs one week prior

## 2014-05-11 ENCOUNTER — Telehealth: Payer: Self-pay | Admitting: Pulmonary Disease

## 2014-05-11 DIAGNOSIS — G4734 Idiopathic sleep related nonobstructive alveolar hypoventilation: Secondary | ICD-10-CM

## 2014-05-11 NOTE — Telephone Encounter (Signed)
Called spoke with pt. He will have a change in insurance 06/07/14., needs order sent to [facility] to change DME's. Order placed. nothing further needed

## 2014-05-15 ENCOUNTER — Encounter (INDEPENDENT_AMBULATORY_CARE_PROVIDER_SITE_OTHER): Payer: BC Managed Care – PPO | Admitting: Surgery

## 2014-06-02 ENCOUNTER — Encounter: Payer: BC Managed Care – PPO | Attending: Surgery | Admitting: Dietician

## 2014-06-02 VITALS — Ht 70.5 in | Wt 223.0 lb

## 2014-06-02 DIAGNOSIS — E669 Obesity, unspecified: Secondary | ICD-10-CM

## 2014-06-02 DIAGNOSIS — Z6833 Body mass index (BMI) 33.0-33.9, adult: Secondary | ICD-10-CM | POA: Diagnosis not present

## 2014-06-02 DIAGNOSIS — Z713 Dietary counseling and surveillance: Secondary | ICD-10-CM | POA: Insufficient documentation

## 2014-06-02 NOTE — Patient Instructions (Signed)
-  Avoid carbs -Develop exercise routine!  TANITA  BODY COMP RESULTS  11/17/13 12/09/13 01/13/14 03/02/14 06/02/14   BMI (kg/m^2) 36 33.8 33.2 32.3 31.5   Fat Mass (lbs) 85.5 71.0 76.5 67.5 67   Fat Free Mass (lbs) 165.5 164.5 155 157.5 156   Total Body Water (lbs) 121 120.5 113.5 115.5 114

## 2014-06-02 NOTE — Progress Notes (Signed)
  Follow-up visit:  6 Months Post-Operative LAGB Surgery  Medical Nutrition Therapy:  Appt start time: 2585 end time: 1100   Primary concerns today: Post-operative Bariatric Surgery Nutrition Management. Robert Cordova returns having maintained his weight since last visit. He states he would like to lose more weight. Recognizes he needs to exercise more. He states that he is meeting his protein needs but eating some carbs occasionally (bread, corn chips, fried fish).   Surgery date: 11/25/2013 Surgery type: LAGB Start weight at [facility]: 259 on 09/01/13 Weight today: 223 lbs Weight change: 2 lbs Total weight lost: 36 lbs  Goal weight: 170 lbs  TANITA  BODY COMP RESULTS  11/17/13 12/09/13 01/13/14 03/02/14 06/02/14   BMI (kg/m^2) 36 33.8 33.2 32.3 31.5   Fat Mass (lbs) 85.5 71.0 76.5 67.5 67   Fat Free Mass (lbs) 165.5 164.5 155 157.5 156   Total Body Water (lbs) 121 120.5 113.5 115.5 114    Preferred Learning Style:  No preference indicated   Learning Readiness:   Ready  24-hr recall: B (AM): 2-3 eggs with bacon Snk ( AM):   L (PM): chicken or lunch meat (sometimes skips) Snk ( PM):   D (PM): romaine lettuce and meat  Snk (PM):   Fluid intake: at least 64 oz per patient report; water with SF packets, unsweeted tea with Splenda Estimated total protein intake: 80+ grams per day  Medications: Metformin and losartan; no longer on insulin or cholesterol meds Supplementation: taking; Calcium inconsistent  CBG monitoring: 1x a week in the morning Average CBG per patient: <120 mg/dL Last patient reported A1c: 6.4% in September 2015  Using straws: no Drinking while eating: occasionally Hair loss: none Carbonated beverages: tried once or twice N/V/D/C: vomiting when eating too big of a bite Last Lap-Band fill: 0.5 cc in August 2015  Recent physical activity:  none  Progress Towards Goal(s):  In progress.   Nutritional Diagnosis:  [place]-3.3 Overweight/obesity related to past poor dietary  habits and physical inactivity as evidenced by patient w/ recent LAGB surgery following dietary guidelines for continued weight loss.     Intervention:  Nutrition counseling provided.  Teaching Method Utilized:  Visual Auditory  Barriers to learning/adherence to lifestyle change: none  Demonstrated degree of understanding via:  Teach Back   Monitoring/Evaluation:  Dietary intake, exercise, lap band fills, and body weight. Follow up in 3 months for 9 month post-op visit.

## 2014-06-03 ENCOUNTER — Telehealth: Payer: Self-pay | Admitting: Pulmonary Disease

## 2014-06-03 NOTE — Telephone Encounter (Signed)
Spoke with patient-he will come by and see TP tomorrow as he needs yearly appt and ? ONO for new DME. Nothing more needed at this time.

## 2014-06-04 ENCOUNTER — Telehealth: Payer: Self-pay | Admitting: Family Medicine

## 2014-06-04 ENCOUNTER — Encounter: Payer: Self-pay | Admitting: Adult Health

## 2014-06-04 ENCOUNTER — Ambulatory Visit (INDEPENDENT_AMBULATORY_CARE_PROVIDER_SITE_OTHER): Payer: BC Managed Care – PPO | Admitting: Adult Health

## 2014-06-04 VITALS — BP 122/60 | HR 65 | Temp 97.0°F | Ht 70.5 in | Wt 233.4 lb

## 2014-06-04 DIAGNOSIS — G4734 Idiopathic sleep related nonobstructive alveolar hypoventilation: Secondary | ICD-10-CM

## 2014-06-04 NOTE — Telephone Encounter (Signed)
Pt would like you to call. Refused to elaborate. Something about new insurance, but then he just states have her call. Today or tomorrow

## 2014-06-04 NOTE — Patient Instructions (Signed)
We will set you up for a Overnight Oximetry test on room air for Oxygen qualification.  Follow up Dr. Gwenette Greet in 1 year and As needed   [place] job on weight loss.

## 2014-06-04 NOTE — Telephone Encounter (Signed)
Spoke with patient and he will be using [employer] order for refills

## 2014-06-04 NOTE — Assessment & Plan Note (Signed)
Requalification for O2 At bedtime   Plan  Set up ONO per DME requirement .  follow up 1 yr

## 2014-06-04 NOTE — Addendum Note (Signed)
Addended by: Parke Poisson E on: 06/04/2014 02:57 PM   Modules accepted: Orders

## 2014-06-04 NOTE — Progress Notes (Signed)
   Subjective:    Patient ID: Robert Cordova, male    DOB: 07/10/1951, 63 y.o.   MRN: [id number]  HPI 63 yo seen for sleep consult 07/2013 , found to have no OSA but nocturnal desats (probably secondary to hypoventilation)  . Started on o2 at 2l/m At bedtime    06/04/2014 Follow up  Pt changed from [facility] to [employer] due to change to medicare - Pt needs new ONO per insurance - Sleeps with oxygen at 2L at night - Pt feels he sleeps better with oxygen.  Has lost 40lbs since lap band surgery earlier this year.  Patient denies any daytime sleepiness, chest pain, orthopnea, PND or leg swelling  Review of Systems Constitutional:   No  weight loss, night sweats,  Fevers, chills,  +fatigue, or  lassitude.  HEENT:   No headaches,  Difficulty swallowing,  Tooth/dental problems, or  Sore throat,                No sneezing, itching, ear ache, nasal congestion, post nasal drip,   CV:  No chest pain,  Orthopnea, PND, swelling in lower extremities, anasarca, dizziness, palpitations, syncope.   GI  No heartburn, indigestion, abdominal pain, nausea, vomiting, diarrhea, change in bowel habits, loss of appetite, bloody stools.   Resp: No shortness of breath with exertion or at rest.  No excess mucus, no productive cough,  No non-productive cough,  No coughing up of blood.  No change in color of mucus.  No wheezing.  No chest wall deformity  Skin: no rash or lesions.  GU: no dysuria, change in color of urine, no urgency or frequency.  No flank pain, no hematuria   MS:  No joint pain or swelling.  No decreased range of motion.  No back pain.  Psych:  No change in mood or affect. No depression or anxiety.  No memory loss.         Objective:   Physical Exam GEN: A/Ox3; pleasant , NAD, well nourished   HEENT:  [place]/AT,  EACs-clear, TMs-wnl, NOSE-clear, THROAT-clear, no lesions, no postnasal drip or exudate noted.   NECK:  Supple w/ fair ROM; no JVD; normal carotid impulses w/o bruits; no thyromegaly  or nodules palpated; no lymphadenopathy.  RESP  Clear  P & A; w/o, wheezes/ rales/ or rhonchi.no accessory muscle use, no dullness to percussion  CARD:  RRR, no m/r/g  , no peripheral edema, pulses intact, no cyanosis or clubbing.  GI:   Soft & nt; nml bowel sounds; no organomegaly or masses detected.  Musco: Warm bil, no deformities or joint swelling noted.   Neuro: alert, no focal deficits noted.    Skin: Warm, no lesions or rashes         Assessment & Plan:

## 2014-06-08 ENCOUNTER — Telehealth: Payer: Self-pay

## 2014-06-08 DIAGNOSIS — J9611 Chronic respiratory failure with hypoxia: Secondary | ICD-10-CM

## 2014-06-08 NOTE — Telephone Encounter (Signed)
Spoke with Tenna Child states she needs a different Dx Code as Nocturnal Hypoxemia will not go through under Medicare. Have discussed with JJ and will send to her and TP to advise on dx code as [place] out of office until Friday and TP seen patient last.   Will need to call [facility] with Dx code and fax 06-04-14 OV notes as well.

## 2014-06-09 ENCOUNTER — Telehealth: Payer: Self-pay | Admitting: Family Medicine

## 2014-06-09 DIAGNOSIS — E118 Type 2 diabetes mellitus with unspecified complications: Secondary | ICD-10-CM

## 2014-06-09 DIAGNOSIS — I10 Essential (primary) hypertension: Secondary | ICD-10-CM

## 2014-06-09 NOTE — Telephone Encounter (Signed)
Diagnoses discussed with TP Per TP: already on O2 therapy.  Okay to add chronic respiratory failure to problem list  Called [facility], spoke with Maudie Mercury.  Advised of the above.  Kim voiced her understanding.  Nothing further needed; will sign off.

## 2014-06-09 NOTE — Telephone Encounter (Signed)
[place], [place] [facility] [address] is requesting re-fills on fluticasone (FLONASE) 50 MCG/ACT nasal spray, losartan (COZAAR) 25 MG tablet, metFORMIN (GLUCOPHAGE) 500 MG tablet

## 2014-06-10 ENCOUNTER — Ambulatory Visit: Payer: BC Managed Care – PPO | Admitting: Pulmonary Disease

## 2014-06-10 MED ORDER — FLUTICASONE PROPIONATE 50 MCG/ACT NA SUSP: 1.0000 | Freq: Every day | NASAL | Status: DC

## 2014-06-10 MED ORDER — LOSARTAN POTASSIUM 25 MG PO TABS: ORAL_TABLET | ORAL | Status: DC

## 2014-06-10 MED ORDER — METFORMIN HCL 500 MG PO TABS: 500.0000 mg | ORAL_TABLET | Freq: Two times a day (BID) | ORAL | Status: DC

## 2014-06-10 NOTE — Telephone Encounter (Signed)
Rx sent 

## 2014-06-12 ENCOUNTER — Other Ambulatory Visit: Payer: Self-pay | Admitting: Family Medicine

## 2014-06-18 ENCOUNTER — Other Ambulatory Visit: Payer: Self-pay | Admitting: *Deleted

## 2014-06-22 ENCOUNTER — Telehealth: Payer: Self-pay | Admitting: Adult Health

## 2014-06-22 NOTE — Telephone Encounter (Signed)
Spoke with the pt  He is asking about ONO results Please advise thanks!

## 2014-06-23 NOTE — Telephone Encounter (Signed)
ONO received and review by TP: pt does requalify for O2 Called spoke with patient and advised of results - pt voiced his understanding  Pt does have some additional questions/concerns: TP pt would like to know if there is "something inside" him that is causing his O2 to drop at bedtime and if there is any additional testing that would explain why his sats drop.  Please advise, thank you.

## 2014-06-23 NOTE — Telephone Encounter (Signed)
lmtcb x1 

## 2014-06-23 NOTE — Telephone Encounter (Signed)
lmtcb for pt.  

## 2014-06-23 NOTE — Telephone Encounter (Signed)
Pt returned call & [phone].  Robert Cordova

## 2014-06-23 NOTE — Telephone Encounter (Signed)
Very good question most likely due to hypoventliation syndrome  (simple term is shallow breathing ) from obesity

## 2014-06-24 NOTE — Telephone Encounter (Signed)
Called, spoke with pt.  Discussed below per TP.  He verbalized understanding and voiced no further questions or concerns at this time.

## 2014-07-03 ENCOUNTER — Other Ambulatory Visit: Payer: Self-pay | Admitting: Family Medicine

## 2014-07-15 ENCOUNTER — Ambulatory Visit: Payer: BC Managed Care – PPO | Admitting: Pulmonary Disease

## 2014-08-04 ENCOUNTER — Other Ambulatory Visit (INDEPENDENT_AMBULATORY_CARE_PROVIDER_SITE_OTHER): Payer: Medicare Other

## 2014-08-04 DIAGNOSIS — N400 Enlarged prostate without lower urinary tract symptoms: Secondary | ICD-10-CM

## 2014-08-04 DIAGNOSIS — R300 Dysuria: Secondary | ICD-10-CM

## 2014-08-04 DIAGNOSIS — E119 Type 2 diabetes mellitus without complications: Secondary | ICD-10-CM

## 2014-08-04 DIAGNOSIS — E039 Hypothyroidism, unspecified: Secondary | ICD-10-CM

## 2014-08-04 DIAGNOSIS — E785 Hyperlipidemia, unspecified: Secondary | ICD-10-CM | POA: Diagnosis not present

## 2014-08-04 DIAGNOSIS — D649 Anemia, unspecified: Secondary | ICD-10-CM

## 2014-08-04 DIAGNOSIS — I10 Essential (primary) hypertension: Secondary | ICD-10-CM

## 2014-08-04 LAB — BASIC METABOLIC PANEL
BUN: 14 mg/dL (ref 6–23)
CALCIUM: 9.7 mg/dL (ref 8.4–10.5)
CHLORIDE: 106 meq/L (ref 96–112)
CO2: 29 meq/L (ref 19–32)
Creatinine, Ser: 0.7 mg/dL (ref 0.4–1.5)
GFR: 114.55 mL/min (ref >60.00)
GLUCOSE: 115 mg/dL — AB (ref 70–99)
POTASSIUM: 4.7 meq/L (ref 3.5–5.1)
Sodium: 141 mEq/L (ref 135–145)

## 2014-08-04 LAB — CBC WITH DIFFERENTIAL/PLATELET
Basophils Absolute: 0 10*3/uL (ref 0.0–0.1)
Basophils Relative: 0.3 % (ref 0.0–3.0)
Eosinophils Absolute: 0.1 10*3/uL (ref 0.0–0.7)
Eosinophils Relative: 0.9 % (ref 0.0–5.0)
HEMATOCRIT: 42.9 % (ref 39.0–52.0)
HEMOGLOBIN: 14.3 g/dL (ref 13.0–17.0)
LYMPHS ABS: 2 10*3/uL (ref 0.7–4.0)
LYMPHS PCT: 16.7 % (ref 12.0–46.0)
MCHC: 33.4 g/dL (ref 30.0–36.0)
MCV: 91.7 fl (ref 78.0–100.0)
MONO ABS: 0.8 10*3/uL (ref 0.1–1.0)
Monocytes Relative: 6.4 % (ref 3.0–12.0)
Neutro Abs: 9.1 10*3/uL — ABNORMAL HIGH (ref 1.4–7.7)
Neutrophils Relative %: 75.7 % (ref 43.0–77.0)
PLATELETS: 307 10*3/uL (ref 150.0–400.0)
RBC: 4.68 Mil/uL (ref 4.22–5.81)
RDW: 12.9 % (ref 11.5–15.5)
WBC: 12.1 10*3/uL — ABNORMAL HIGH (ref 4.0–10.5)

## 2014-08-04 LAB — POCT URINALYSIS DIPSTICK
BILIRUBIN UA: NEGATIVE
Glucose, UA: NEGATIVE
Ketones, UA: NEGATIVE
Leukocytes, UA: NEGATIVE
Nitrite, UA: NEGATIVE
PH UA: 5
PROTEIN UA: NEGATIVE
Spec Grav, UA: 1.025
UROBILINOGEN UA: 0.2

## 2014-08-04 LAB — LDL CHOLESTEROL, DIRECT: LDL DIRECT: 107.8 mg/dL

## 2014-08-04 LAB — HEPATIC FUNCTION PANEL
ALT: 17 U/L (ref 0–53)
AST: 15 U/L (ref 0–37)
Albumin: 4.2 g/dL (ref 3.5–5.2)
Alkaline Phosphatase: 54 U/L (ref 39–117)
BILIRUBIN TOTAL: 0.5 mg/dL (ref 0.2–1.2)
Bilirubin, Direct: 0.1 mg/dL (ref 0.0–0.3)
TOTAL PROTEIN: 7.2 g/dL (ref 6.0–8.3)

## 2014-08-04 LAB — LIPID PANEL
Cholesterol: 183 mg/dL (ref 0–200)
HDL: 34.5 mg/dL — AB (ref >39.00)
NonHDL: 148.5
TRIGLYCERIDES: 232 mg/dL — AB (ref 0.0–149.0)
Total CHOL/HDL Ratio: 5
VLDL: 46.4 mg/dL — ABNORMAL HIGH (ref 0.0–40.0)

## 2014-08-04 LAB — MICROALBUMIN / CREATININE URINE RATIO
Creatinine,U: 274.6 mg/dL
Microalb Creat Ratio: 0.3 mg/g (ref 0.0–30.0)
Microalb, Ur: 0.8 mg/dL (ref 0.0–1.9)

## 2014-08-04 LAB — PSA: PSA: 1.36 ng/mL (ref 0.10–4.00)

## 2014-08-04 LAB — HEMOGLOBIN A1C: HEMOGLOBIN A1C: 7 % — AB (ref 4.6–6.5)

## 2014-08-04 LAB — TSH: TSH: 1.03 u[IU]/mL (ref 0.35–4.50)

## 2014-08-11 ENCOUNTER — Ambulatory Visit (INDEPENDENT_AMBULATORY_CARE_PROVIDER_SITE_OTHER): Payer: Medicare Other | Admitting: Family Medicine

## 2014-08-11 ENCOUNTER — Encounter: Payer: Self-pay | Admitting: Family Medicine

## 2014-08-11 VITALS — BP 130/80 | Temp 97.7°F | Ht 71.0 in | Wt 226.0 lb

## 2014-08-11 DIAGNOSIS — E118 Type 2 diabetes mellitus with unspecified complications: Secondary | ICD-10-CM

## 2014-08-11 DIAGNOSIS — I10 Essential (primary) hypertension: Secondary | ICD-10-CM

## 2014-08-11 DIAGNOSIS — E119 Type 2 diabetes mellitus without complications: Secondary | ICD-10-CM | POA: Diagnosis not present

## 2014-08-11 DIAGNOSIS — E785 Hyperlipidemia, unspecified: Secondary | ICD-10-CM

## 2014-08-11 DIAGNOSIS — Z9884 Bariatric surgery status: Secondary | ICD-10-CM | POA: Diagnosis not present

## 2014-08-11 MED ORDER — FLUTICASONE PROPIONATE 50 MCG/ACT NA SUSP: 1.0000 | Freq: Every day | NASAL | Status: DC

## 2014-08-11 MED ORDER — LOSARTAN POTASSIUM 25 MG PO TABS
ORAL_TABLET | ORAL | Status: DC
Start: 2014-08-11 — End: 2015-03-25

## 2014-08-11 MED ORDER — MINOXIDIL 2 % EX SOLN: 1.0000 "application " | Freq: Every day | CUTANEOUS | Status: AC

## 2014-08-11 MED ORDER — METFORMIN HCL 500 MG PO TABS: ORAL_TABLET | ORAL | Status: DC

## 2014-08-11 NOTE — Progress Notes (Signed)
Pre visit review using our clinic review tool, if applicable. No additional management support is needed unless otherwise documented below in the visit note. Lab Results  Component Value Date   HGBA1C 7.0* 08/04/2014   HGBA1C 6.4 04/30/2014   HGBA1C 6.2 01/13/2014   Lab Results  Component Value Date   MICROALBUR 0.8 08/04/2014   LDLCALC 108* 01/13/2014   CREATININE 0.7 08/04/2014

## 2014-08-11 NOTE — Patient Instructions (Signed)
Continue your current medications  Remember to walk 30 minutes daily  Follow-up office visit in 6 months  Labs one week prior

## 2014-08-11 NOTE — Progress Notes (Signed)
   Subjective:    Patient ID: Robert Cordova, male    DOB: Nov 18, 1950, 64 y.o.   MRN: [id number]  HPI Robert Cordova is a 64 year old married male nonsmoker who comes in today for general physical examination because of a history of diabetes, hypertension, and obesity  He had the gastric surgery in April 2016 weight is down to 226. Blood sugar on metformin 1000 mg twice a day is 112 with a hemoglobin A1c of 7.0%  He also takes Cozaar 25 mg dose one half tab daily for hypertension BP 130/80  Recent lipid panel shows his LDL 107  Vaccinations updated by Apolonio Schneiders  Cognitive function normal he walks daily home health safety reviewed no issues identified, no guns in the house, he does not have a healthcare power of attorney nor living well,,,,,,,,,,, encouraged to do so.   Review of Systems  Constitutional: Negative.   HENT: Negative.   Eyes: Negative.   Respiratory: Negative.   Cardiovascular: Negative.   Gastrointestinal: Negative.   Endocrine: Negative.   Genitourinary: Negative.   Musculoskeletal: Negative.   Skin: Negative.   Allergic/Immunologic: Negative.   Neurological: Negative.   Hematological: Negative.   Psychiatric/Behavioral: Negative.        Objective:   Physical Exam  Constitutional: He is oriented to person, place, and time. He appears well-developed and well-nourished.  HENT:  Head: Normocephalic and atraumatic.  Right Ear: External ear normal.  Left Ear: External ear normal.  Nose: Nose normal.  Mouth/Throat: Oropharynx is clear and moist.  Eyes: Conjunctivae and EOM are normal. Pupils are equal, round, and reactive to light.  Neck: Normal range of motion. Neck supple. No JVD present. No tracheal deviation present. No thyromegaly present.  Cardiovascular: Normal rate, regular rhythm, normal heart sounds and intact distal pulses.  Exam reveals no gallop and no friction rub.   No murmur heard. No carotid nor aortic bruits peripheral pulses 2+ and symmetrical    Pulmonary/Chest: Effort normal and breath sounds normal. No stridor. No respiratory distress. He has no wheezes. He has no rales. He exhibits no tenderness.  Abdominal: Soft. Bowel sounds are normal. He exhibits no distension and no mass. There is no tenderness. There is no rebound and no guarding.  Genitourinary: Rectum normal, prostate normal and penis normal. Guaiac negative stool. No penile tenderness.  Musculoskeletal: Normal range of motion. He exhibits no edema or tenderness.  Lymphadenopathy:    He has no cervical adenopathy.  Neurological: He is alert and oriented to person, place, and time. He has normal reflexes. No cranial nerve deficit. He exhibits normal muscle tone.  Skin: Skin is warm and dry. No rash noted. No erythema. No pallor.  Total body skin exam normal scars 2 in the abdomen well-healed from previous gastric surgery April 2016 for weight loss  Psychiatric: He has a normal mood and affect. His behavior is normal. Judgment and thought content normal.  Nursing note and vitals reviewed.  1+ symmetrical nonnodular BPH nocturia 1       Assessment & Plan:  Diabetes type 2,,,,,,,,,, at goal continue therapy current therapy follow-up in 6 months  Hypertension at goal......... continue current medication follow-up in one year  Status post gastric surgery for weight loss ,,,,,,,

## 2014-08-13 DIAGNOSIS — Z9884 Bariatric surgery status: Secondary | ICD-10-CM | POA: Diagnosis not present

## 2014-09-02 ENCOUNTER — Encounter: Payer: Medicare Other | Attending: Surgery | Admitting: Dietician

## 2014-09-02 VITALS — Ht 70.0 in | Wt 223.5 lb

## 2014-09-02 DIAGNOSIS — Z79899 Other long term (current) drug therapy: Secondary | ICD-10-CM | POA: Diagnosis not present

## 2014-09-02 DIAGNOSIS — Z6832 Body mass index (BMI) 32.0-32.9, adult: Secondary | ICD-10-CM | POA: Insufficient documentation

## 2014-09-02 DIAGNOSIS — Z48815 Encounter for surgical aftercare following surgery on the digestive system: Secondary | ICD-10-CM | POA: Diagnosis not present

## 2014-09-02 DIAGNOSIS — Z713 Dietary counseling and surveillance: Secondary | ICD-10-CM | POA: Diagnosis not present

## 2014-09-02 DIAGNOSIS — Z9884 Bariatric surgery status: Secondary | ICD-10-CM | POA: Insufficient documentation

## 2014-09-02 DIAGNOSIS — E119 Type 2 diabetes mellitus without complications: Secondary | ICD-10-CM | POA: Insufficient documentation

## 2014-09-02 DIAGNOSIS — E669 Obesity, unspecified: Secondary | ICD-10-CM | POA: Insufficient documentation

## 2014-09-02 NOTE — Patient Instructions (Addendum)
-  Get trigger foods out of the house -Keep healthier, low carb snacks on hand: deli meat, 2% cottage cheese, string cheese, pimiento cheese with Quest protein chips or pork rinds, roasted chickpeas, soybeans -Try 100 calorie mini bags of lite popcorn

## 2014-09-02 NOTE — Progress Notes (Signed)
  Follow-up visit:  9 Months Post-Operative LAGB Surgery  Medical Nutrition Therapy:  Appt start time: 620 end time:  950  Primary concerns today: Post-operative Bariatric Surgery Nutrition Management. Vadhir returns having maintained his weight since last visit. He reports his weight has dropped as low as 215 lbs. He knows he needs to exercise and limit his carbohydrates. Snacks in front of the TV at night. Has not had a fill lately and doesn't feel like he needs one. Sometimes doesn't chew well enough and has to regurgitate.  Surgery date: 11/25/2013 Surgery type: LAGB Start weight at [facility]: 259 on 09/01/13 Weight today: 223.5 lbs Weight change: 0.5 lbs gain Total weight lost: 35.5 lbs  Goal weight: 170 lbs  TANITA  BODY COMP RESULTS  11/17/13 12/09/13 01/13/14 03/02/14 06/02/14 09/02/14   BMI (kg/m^2) 36 33.8 33.2 32.3 31.5 32.1   Fat Mass (lbs) 85.5 71.0 76.5 67.5 67 72.5   Fat Free Mass (lbs) 165.5 164.5 155 157.5 156 151   Total Body Water (lbs) 121 120.5 113.5 115.5 114 110.5    Preferred Learning Style:  No preference indicated   Learning Readiness:   Ready  24-hr recall: B (AM): 2-3 eggs with bacon Snk ( AM):   L (PM): chicken or lunch meat (sometimes skips) Snk ( PM):   D (PM): romaine lettuce and meat  Snk (PM):   Fluid intake: at least 64 oz per patient report; water with SF packets, unsweeted tea with Splenda Estimated total protein intake: 80+ grams per day  Medications: Metformin and losartan; no longer on insulin or cholesterol meds Supplementation: taking; Calcium inconsistent  CBG monitoring: 1x a week in the morning Average CBG per patient: <120 mg/dL Last patient reported A1c: 6.4% in September 2015  Using straws: no Drinking while eating: occasionally Hair loss: none Carbonated beverages: tried once or twice N/V/D/C: vomiting when eating too big of a bite Last Lap-Band fill: 0.5 cc in August 2015  Recent physical activity:  none  Progress Towards  Goal(s):  In progress.   Nutritional Diagnosis:  [place]-3.3 Overweight/obesity related to past poor dietary habits and physical inactivity as evidenced by patient w/ recent LAGB surgery following dietary guidelines for continued weight loss.     Intervention:  Nutrition counseling provided.  Teaching Method Utilized:  Visual Auditory  Barriers to learning/adherence to lifestyle change: none  Demonstrated degree of understanding via:  Teach Back   Monitoring/Evaluation:  Dietary intake, exercise, lap band fills, and body weight. Follow up in 3 months for 12 month post-op visit.

## 2014-09-15 DIAGNOSIS — H2513 Age-related nuclear cataract, bilateral: Secondary | ICD-10-CM | POA: Diagnosis not present

## 2014-09-15 LAB — HM DIABETES EYE EXAM

## 2014-10-05 ENCOUNTER — Encounter: Payer: Self-pay | Admitting: Family Medicine

## 2014-10-22 DIAGNOSIS — Z4651 Encounter for fitting and adjustment of gastric lap band: Secondary | ICD-10-CM | POA: Diagnosis not present

## 2014-10-26 ENCOUNTER — Encounter: Payer: Self-pay | Admitting: Adult Health

## 2014-12-02 ENCOUNTER — Encounter: Payer: Medicare Other | Attending: Surgery | Admitting: Dietician

## 2014-12-02 ENCOUNTER — Encounter: Payer: Self-pay | Admitting: Dietician

## 2014-12-02 VITALS — Ht 70.0 in | Wt 225.5 lb

## 2014-12-02 DIAGNOSIS — Z713 Dietary counseling and surveillance: Secondary | ICD-10-CM | POA: Diagnosis not present

## 2014-12-02 DIAGNOSIS — Z6832 Body mass index (BMI) 32.0-32.9, adult: Secondary | ICD-10-CM | POA: Diagnosis not present

## 2014-12-02 DIAGNOSIS — E669 Obesity, unspecified: Secondary | ICD-10-CM | POA: Insufficient documentation

## 2014-12-02 NOTE — Patient Instructions (Addendum)
-  Get trigger foods out of the house -Keep healthier, low carb snacks on hand: deli meat, 2% cottage cheese, string cheese, pimiento cheese with Quest protein chips or pork rinds, roasted chickpeas, soybeans -Keep walking! -Resume taking Calcium -Keep eating mostly lean proteins and non starchy vegetables

## 2014-12-02 NOTE — Progress Notes (Signed)
  Follow-up visit:  12 Months Post-Operative LAGB Surgery  Medical Nutrition Therapy:  Appt start time: 910 end time:  930  Primary concerns today: Post-operative Bariatric Surgery Nutrition Management. Robert Cordova returns having gained a few pounds. He reports that he had a fill recently and is feeling a lot more restriction. Having difficulty not drinking or eating within 2-3 hours of bedtime and sometimes regurgitates food or pills. Still avoiding fruit, may eat nuts sometimes. Noticing that blood sugars have been increasing so he started walking.   Surgery date: 11/25/2013 Surgery type: LAGB Start weight at [facility]: 259 on 09/01/13 (265 lbs per patient) Weight today: 225.5 lbs Weight change: 2 lbs gain Total weight lost: 39.5 lbs  Goal weight: 170 lbs  TANITA  BODY COMP RESULTS  11/17/13 12/09/13 01/13/14 03/02/14 06/02/14 09/02/14 12/02/14   BMI (kg/m^2) 36 33.8 33.2 32.3 31.5 32.1 32.4   Fat Mass (lbs) 85.5 71.0 76.5 67.5 67 72.5 76   Fat Free Mass (lbs) 165.5 164.5 155 157.5 156 151 149.5   Total Body Water (lbs) 121 120.5 113.5 115.5 114 110.5 109.5    Preferred Learning Style:  No preference indicated   Learning Readiness:   Ready  24-hr recall: B (AM): coffee with sugar free powdered creamer, cottage cheese OR sausage OR scrambled eggs Snk ( AM):   L (PM): chicken or lunch meat Snk ( PM):   D (PM): chicken, occasionally fried fish  Snk (PM): sugar free jello or sometimes Breyer's low carb ice cream  Fluid intake: at least 64 oz per patient report; water with SF packets, unsweeted tea with Splenda Estimated total protein intake: 80+ grams per day  Medications: Metformin and losartan; no longer on insulin or cholesterol meds Supplementation: taking; Calcium inconsistent  CBG monitoring: 1x a week in the morning Average CBG per patient: <120 mg/dL Last patient reported A1c: 6.4% in September 2015  Using straws: no Drinking while eating: occasionally Hair loss:  none Carbonated beverages: tried once or twice N/V/D/C: vomiting when eating too big of a bite Last Lap-Band fill: 0.5 cc recently  Recent physical activity:  Walking for an hour 3-4x a week  Progress Towards Goal(s):  In progress.   Nutritional Diagnosis:  [place]-3.3 Overweight/obesity related to past poor dietary habits and physical inactivity as evidenced by patient w/ recent LAGB surgery following dietary guidelines for continued weight loss.     Intervention:  Nutrition counseling provided.  Teaching Method Utilized:  Visual Auditory  Barriers to learning/adherence to lifestyle change: none  Demonstrated degree of understanding via:  Teach Back   Monitoring/Evaluation:  Dietary intake, exercise, lap band fills, and body weight. Follow up in 3 months for 15 month post-op visit.

## 2014-12-24 DIAGNOSIS — Z9884 Bariatric surgery status: Secondary | ICD-10-CM | POA: Diagnosis not present

## 2014-12-29 ENCOUNTER — Telehealth: Payer: Self-pay | Admitting: Family Medicine

## 2014-12-29 MED ORDER — FLUTICASONE PROPIONATE 50 MCG/ACT NA SUSP: 1.0000 | Freq: Every day | NASAL | Status: DC

## 2014-12-29 NOTE — Telephone Encounter (Signed)
Pt needs refill on flonase send to [facility] order

## 2015-02-15 ENCOUNTER — Other Ambulatory Visit: Payer: Medicare Other

## 2015-03-02 ENCOUNTER — Telehealth: Payer: Self-pay | Admitting: Pulmonary Disease

## 2015-03-02 NOTE — Telephone Encounter (Signed)
Called spoke with pt. He reports he received a call from [place] stating he needed a repeat ONO Called [facility] and they are able to use ONO from October 2015. They needed nothing further from pt. Called pt and made aware. Nothing further needed

## 2015-03-03 ENCOUNTER — Encounter: Payer: Self-pay | Admitting: Dietician

## 2015-03-03 ENCOUNTER — Encounter: Payer: Medicare Other | Attending: Surgery | Admitting: Dietician

## 2015-03-03 DIAGNOSIS — Z9884 Bariatric surgery status: Secondary | ICD-10-CM | POA: Diagnosis not present

## 2015-03-03 DIAGNOSIS — E669 Obesity, unspecified: Secondary | ICD-10-CM

## 2015-03-03 DIAGNOSIS — Z713 Dietary counseling and surveillance: Secondary | ICD-10-CM | POA: Insufficient documentation

## 2015-03-03 DIAGNOSIS — Z48815 Encounter for surgical aftercare following surgery on the digestive system: Secondary | ICD-10-CM | POA: Diagnosis not present

## 2015-03-03 DIAGNOSIS — E6609 Other obesity due to excess calories: Secondary | ICD-10-CM | POA: Diagnosis not present

## 2015-03-03 DIAGNOSIS — Z6831 Body mass index (BMI) 31.0-31.9, adult: Secondary | ICD-10-CM | POA: Diagnosis not present

## 2015-03-03 DIAGNOSIS — E119 Type 2 diabetes mellitus without complications: Secondary | ICD-10-CM | POA: Insufficient documentation

## 2015-03-03 NOTE — Patient Instructions (Signed)
-  Get trigger foods out of the house -Keep healthier, low carb snacks on hand: deli meat, 2% cottage cheese, string cheese, pimiento cheese with Quest protein chips or pork rinds, roasted chickpeas, soybeans -Keep walking! -Keep eating mostly lean proteins and non starchy vegetables  TANITA  BODY COMP RESULTS  11/17/13 12/09/13 01/13/14 03/02/14 06/02/14 09/02/14 12/02/14 03/03/15   BMI (kg/m^2) 36 33.8 33.2 32.3 31.5 32.1 32.4 31.4   Fat Mass (lbs) 85.5 71.0 76.5 67.5 67 72.5 76 71   Fat Free Mass (lbs) 165.5 164.5 155 157.5 156 151 149.5 147.5   Total Body Water (lbs) 121 120.5 113.5 115.5 114 110.5 109.5 108

## 2015-03-03 NOTE — Progress Notes (Signed)
  Follow-up visit:  15 Months Post-Operative LAGB Surgery  Medical Nutrition Therapy:  Appt start time: 910 end time:  940  Primary concerns today: Post-operative Bariatric Surgery Nutrition Management. Robert Cordova returns having lost 7 pounds. Has been traveling a lot lately and has been avoiding snacking. Has not been checking blood sugars recently. Has noticed that he doesn't have as much of an appetite lately. Still having trouble taking pills at night. He does not feel like he needs a band fill.  Surgery date: 11/25/2013 Surgery type: LAGB Start weight at [facility]: 259 on 09/01/13 (265 lbs per patient) Weight today: 218.5 lbs Weight change: 7 lbs Total weight lost: 46.5 lbs  Goal weight: 170 lbs  TANITA  BODY COMP RESULTS  11/17/13 12/09/13 01/13/14 03/02/14 06/02/14 09/02/14 12/02/14 03/03/15   BMI (kg/m^2) 36 33.8 33.2 32.3 31.5 32.1 32.4 31.4   Fat Mass (lbs) 85.5 71.0 76.5 67.5 67 72.5 76 71   Fat Free Mass (lbs) 165.5 164.5 155 157.5 156 151 149.5 147.5   Total Body Water (lbs) 121 120.5 113.5 115.5 114 110.5 109.5 108    Preferred Learning Style:  No preference indicated   Learning Readiness:   Ready  24-hr recall: B (AM): 2-3 eggs  Snk ( AM):   L (PM): country style steak with cabbage  Snk ( PM): pistachios  D (PM): ribs without sauce and green beans  Snk (PM): cucumber, cauliflower, pickles, olives, low carb Breyer's ice cream  Fluid intake: at least 64 oz per patient report; water with SF packets, unsweeted tea with Splenda Estimated total protein intake: 80+ grams per day  Medications: Metformin and losartan; no longer on insulin or cholesterol meds Supplementation: taking  CBG monitoring: not testing Average CBG per patient:  Last patient reported A1c: 6.4% in September 2015 (plans to get another next month)  Using straws: no Drinking while eating: occasionally Hair loss: none Carbonated beverages: tried once or twice N/V/D/C: not as much vomiting Last Lap-Band  fill: 0.5 cc about 6 months ago  Recent physical activity:  Has not been exercising recently due to travel (got a new treadmill)  Progress Towards Goal(s):  In progress.   Nutritional Diagnosis:  [place]-3.3 Overweight/obesity related to past poor dietary habits and physical inactivity as evidenced by patient w/ recent LAGB surgery following dietary guidelines for continued weight loss.     Intervention:  Nutrition counseling provided.  Teaching Method Utilized:  Visual Auditory  Barriers to learning/adherence to lifestyle change: none  Demonstrated degree of understanding via:  Teach Back   Monitoring/Evaluation:  Dietary intake, exercise, lap band fills, and body weight. Follow up in 3 months for 18 month post-op visit.

## 2015-03-16 ENCOUNTER — Other Ambulatory Visit (INDEPENDENT_AMBULATORY_CARE_PROVIDER_SITE_OTHER): Payer: Medicare Other

## 2015-03-16 DIAGNOSIS — I10 Essential (primary) hypertension: Secondary | ICD-10-CM

## 2015-03-16 DIAGNOSIS — E119 Type 2 diabetes mellitus without complications: Secondary | ICD-10-CM | POA: Diagnosis not present

## 2015-03-16 LAB — BASIC METABOLIC PANEL
BUN: 20 mg/dL (ref 6–23)
CO2: 25 mEq/L (ref 19–32)
Calcium: 9.6 mg/dL (ref 8.4–10.5)
Chloride: 104 mEq/L (ref 96–112)
Creatinine, Ser: 0.81 mg/dL (ref 0.40–1.50)
GFR: 101.41 mL/min (ref >60.00)
Glucose, Bld: 172 mg/dL — ABNORMAL HIGH (ref 70–99)
POTASSIUM: 4.8 meq/L (ref 3.5–5.1)
Sodium: 138 mEq/L (ref 135–145)

## 2015-03-16 LAB — HEMOGLOBIN A1C: HEMOGLOBIN A1C: 6.8 % — AB (ref 4.6–6.5)

## 2015-03-25 ENCOUNTER — Encounter: Payer: Self-pay | Admitting: Internal Medicine

## 2015-03-25 ENCOUNTER — Telehealth: Payer: Self-pay | Admitting: Family Medicine

## 2015-03-25 ENCOUNTER — Ambulatory Visit (INDEPENDENT_AMBULATORY_CARE_PROVIDER_SITE_OTHER): Payer: Medicare Other | Admitting: Internal Medicine

## 2015-03-25 VITALS — BP 160/88 | HR 70 | Temp 98.5°F | Resp 20 | Ht 70.0 in | Wt 228.0 lb

## 2015-03-25 DIAGNOSIS — E669 Obesity, unspecified: Secondary | ICD-10-CM | POA: Diagnosis not present

## 2015-03-25 DIAGNOSIS — Z9884 Bariatric surgery status: Secondary | ICD-10-CM

## 2015-03-25 DIAGNOSIS — E118 Type 2 diabetes mellitus with unspecified complications: Secondary | ICD-10-CM | POA: Diagnosis not present

## 2015-03-25 DIAGNOSIS — I10 Essential (primary) hypertension: Secondary | ICD-10-CM

## 2015-03-25 MED ORDER — LOSARTAN POTASSIUM 25 MG PO TABS: 25.0000 mg | ORAL_TABLET | Freq: Every day | ORAL | Status: DC

## 2015-03-25 MED ORDER — ATORVASTATIN CALCIUM 20 MG PO TABS: 20.0000 mg | ORAL_TABLET | Freq: Every day | ORAL | Status: DC

## 2015-03-25 MED ORDER — ASPIRIN 81 MG PO TABS: 81.0000 mg | ORAL_TABLET | Freq: Every day | ORAL | Status: DC

## 2015-03-25 NOTE — Progress Notes (Signed)
Subjective:    Patient ID: Robert Cordova, male    DOB: 25-Jun-1951, 64 y.o.   MRN: [id number]  HPI  64 year old patient who has a history of essential hypertension.  He is status post lap band surgery approximately 16 months ago.  Yesterday he had the onset of mild left-sided headaches.  This occurred the earlier this morning.  He was noted to have general surgery follow-up to have the elevated blood pressure of 156/80 earlier today. Medical problems include a history of type 2 diabetes without complications  Lab Results  Component Value Date   HGBA1C 6.8* 03/16/2015    Past Medical History  Diagnosis Date  . ABNORMAL GLUCOSE NEC 03/18/2007  . BLEPHARITIS, LEFT 10/29/2009  . DIABETES-TYPE 2 04/18/2010  . HYPERLIPIDEMIA 10/22/2007  . HYPERTENSION 03/18/2007  . NEVUS, MELANOCYTIC, FACE 10/07/2009  . Environmental allergies   . Multiple thyroid nodules     "biopsy done, everything was fine" being monitored  . GERD (gastroesophageal reflux disease)   . H/O hiatal hernia   . Arthritis     fingers  . Dry throat     Social History   Social History  . Marital Status: Married    Spouse Name: N/A  . Number of Children: N/A  . Years of Education: N/A   Occupational History  . Consultant-- self-employeed    Social History Main Topics  . Smoking status: Never Smoker   . Smokeless tobacco: Never Used  . Alcohol Use: No  . Drug Use: No  . Sexual Activity: Not on file   Other Topics Concern  . Not on file   Social History Narrative    Past Surgical History  Procedure Laterality Date  . Colonoscopy  2009, 2013    Dr. Sharlett Iles; multiple polyps  . Biopsy thyroid  ~08/2013  . Breath tek h pylori N/A 09/05/2013    Procedure: BREATH TEK H PYLORI;  Surgeon: [name], MD;  Location: [facility];  Service: General;  Laterality: N/A;  . No past surgeries    . Laparoscopic gastric banding N/A 11/25/2013    Procedure: LAPAROSCOPIC GASTRIC BANDING;  Surgeon: [name], MD;  Location: [facility];  Service: General;  Laterality: N/A;  . Hiatal hernia repair  11/25/2013    Procedure: LAPAROSCOPIC REPAIR OF HIATAL HERNIA;  Surgeon: [name], MD;  Location: [facility];  Service: General;;    Family History  Problem Relation Age of Onset  . Hyperlipidemia Father   . Hypertension Father   . Heart attack Father   . Colon cancer Neg Hx   . Stomach cancer Neg Hx   . Esophageal cancer Neg Hx   . Rectal cancer Neg Hx   . Hyperlipidemia Mother   . Hyperlipidemia Brother   . Hypertension Mother   . Diabetes Maternal Grandmother   . Diabetes Maternal Grandfather     Allergies  Allergen Reactions  . Ace Inhibitors     REACTION: Coughing    Current Outpatient Prescriptions on File Prior to Visit  Medication Sig Dispense Refill  . fluticasone (FLONASE) 50 MCG/ACT nasal spray Place 1 spray into both nostrils daily. 16 g 3  . hydrochlorothiazide (HYDRODIURIL) 25 MG tablet TAKE 1 TABLET (25 MG TOTAL) BY MOUTH DAILY. 90 tablet 3  . metFORMIN (GLUCOPHAGE) 500 MG tablet TAKE 2 TABLETs(500 MG TOTAL) BY MOUTH 2 (TWO) TIMES DAILY WITH A MEAL. 400 tablet 3  . minoxidil (ROGAINE) 2 % external solution Apply 1 application topically daily. [place]  mL 10  . OXYGEN-HELIUM IN Inhale into the lungs.     No current facility-administered medications on file prior to visit.    BP 160/88 mmHg  Pulse 70  Temp(Src) 98.5 F (36.9 C) (Oral)  Resp 20  Ht 5\' 10"  (1.778 m)  Wt 228 lb (103.42 kg)  BMI 32.71 kg/m2  SpO2 97%     Review of Systems  Constitutional: Negative for fever, chills, appetite change and fatigue.  HENT: Negative for congestion, dental problem, ear pain, hearing loss, sore throat, tinnitus, trouble swallowing and voice change.   Eyes: Negative for pain, discharge and visual disturbance.  Respiratory: Negative for cough, chest tightness, wheezing and stridor.   Cardiovascular: Negative for chest pain, palpitations and leg swelling.  Gastrointestinal:  Negative for nausea, vomiting, abdominal pain, diarrhea, constipation, blood in stool and abdominal distention.  Genitourinary: Negative for urgency, hematuria, flank pain, discharge, difficulty urinating and genital sores.  Musculoskeletal: Negative for myalgias, back pain, joint swelling, arthralgias, gait problem and neck stiffness.  Skin: Negative for rash.  Neurological: Positive for dizziness, numbness and headaches. Negative for syncope, speech difficulty and weakness.  Hematological: Negative for adenopathy. Does not bruise/bleed easily.  Psychiatric/Behavioral: Negative for behavioral problems and dysphoric mood. The patient is not nervous/anxious.        Objective:   Physical Exam  Constitutional: He is oriented to person, place, and time. He appears well-developed and well-nourished. No distress.  Blood pressure 140/78 on repeat  HENT:  Head: Normocephalic.  Right Ear: External ear normal.  Left Ear: External ear normal.  Eyes: Conjunctivae and EOM are normal.  Neck: Normal range of motion.  Cardiovascular: Normal rate and normal heart sounds.   Pulmonary/Chest: Breath sounds normal.  Abdominal: Bowel sounds are normal.  Musculoskeletal: Normal range of motion. He exhibits no edema or tenderness.  Neurological: He is alert and oriented to person, place, and time. He displays normal reflexes. No cranial nerve deficit. Coordination normal.  No drift Normal finger to nose testing No motor weakness Subjective decreased sensation left forehead and malar facial region  Psychiatric: He has a normal mood and affect. His behavior is normal.          Assessment & Plan:   Hypertension.  Somewhat labile blood pressure readings at home have been normal.  We'll continue present regimen and follow home blood pressure readings more closely.  He has been using losartan 25 mg daily rather than 1 half to prescribed dose.  We'll continue 25 mg daily. Nonspecific hypesthesia.  Left  facial area.  Options discussed including brain MRI.  Clinical exam otherwise unremarkable.  Patient wishes to observe.  He'll report any new or worsening symptoms and report to the ED for prompt evaluation Diabetes mellitus type 2 without complications.  Will add statin therapy  At 81 mg aspirin  Schedule CPX

## 2015-03-25 NOTE — Progress Notes (Signed)
Pre visit review using our clinic review tool, if applicable. No additional management support is needed unless otherwise documented below in the visit note. 

## 2015-03-25 NOTE — Telephone Encounter (Signed)
Patient Name: Robert Cordova  DOB: 02-18-1951    Initial Comment Caller states his BP is 155/80. He had a sharp pain in head yesterday on his left side the pain woke him up. This morning he is having dizziness.    Nurse Assessment  Nurse: Raphael Gibney, RN, Vanita Ingles Date/Time (Eastern Time): 03/25/2015 11:07:54 AM  Confirm and document reason for call. If symptomatic, describe symptoms. ---Caller states his BP is 156/80 this am. He was a little dizzy this am. BP normally 120-130. Had sharp pain in the left side of his head yesterday. Pain went away after 10-15 min. Had another headache during the night which woke up him. Had routine visit with surgeon this am. Has little numbness in his face.  Has the patient traveled out of the country within the last 30 days? ---No  Does the patient require triage? ---Yes  Related visit to physician within the last 2 weeks? ---No  Does the PT have any chronic conditions? (i.e. diabetes, asthma, etc.) ---Yes  List chronic conditions. ---HTN     Guidelines    Guideline Title Affirmed Question Affirmed Notes  Dizziness - Lightheadedness Taking a medicine that could cause dizziness (e.g., blood pressure medications, diuretics)    Final Disposition User   See Physician within 24 Hours Gene Autry, RN, Vanita Ingles    Comments  Unable to schedule appt on epic. Called back line at office and pt was scheduled for appt at 2:30 pm today with Dr. Para Skeans.   Referrals  REFERRED TO PCP OFFICE   Disagree/Comply: Comply

## 2015-03-25 NOTE — Patient Instructions (Signed)
Report any new or worsening symptoms  Limit your sodium (Salt) intake    It is important that you exercise regularly, at least 20 minutes 3 to 4 times per week.  If you develop chest pain or shortness of breath seek  medical attention.  Please check your blood pressure on a regular basis.  If it is consistently greater than 150/90, please make an office appointment.

## 2015-03-26 ENCOUNTER — Telehealth: Payer: Self-pay | Admitting: Family Medicine

## 2015-03-26 MED ORDER — ATORVASTATIN CALCIUM 20 MG PO TABS: 20.0000 mg | ORAL_TABLET | Freq: Every day | ORAL | Status: DC

## 2015-03-26 NOTE — Telephone Encounter (Signed)
Pt call to say that he need the following med to go to [place]   He did pick up the first rx at [facility]      atorvastatin (LIPITOR) 20 MG tablet

## 2015-03-26 NOTE — Telephone Encounter (Signed)
noted 

## 2015-04-27 ENCOUNTER — Other Ambulatory Visit: Payer: Self-pay | Admitting: Family Medicine

## 2015-05-07 ENCOUNTER — Ambulatory Visit (INDEPENDENT_AMBULATORY_CARE_PROVIDER_SITE_OTHER): Payer: Medicare Other | Admitting: Pulmonary Disease

## 2015-05-07 ENCOUNTER — Encounter: Payer: Self-pay | Admitting: Pulmonary Disease

## 2015-05-07 VITALS — BP 140/90 | HR 60 | Temp 98.0°F | Ht 70.0 in | Wt 233.6 lb

## 2015-05-07 DIAGNOSIS — Z23 Encounter for immunization: Secondary | ICD-10-CM

## 2015-05-07 DIAGNOSIS — E662 Morbid (severe) obesity with alveolar hypoventilation: Secondary | ICD-10-CM | POA: Diagnosis not present

## 2015-05-07 DIAGNOSIS — G4734 Idiopathic sleep related nonobstructive alveolar hypoventilation: Secondary | ICD-10-CM

## 2015-05-07 NOTE — Patient Instructions (Signed)
Will arrange for overnight oxygen test  Follow up in 1 year 

## 2015-05-07 NOTE — Progress Notes (Signed)
Chief Complaint  Patient presents with  . Follow-up    Former [place] pt. here for yearly follow up to qualify for O2 at night. pt currently using O2 qhs on 2LPM.  no c/o chest tigthness or SOB. no concens at this time.     History of Present Illness: Robert Cordova is a 64 y.o. male with sleep related hypoxia.  He has been using 2 liters oxygen at night.  He is here for recertification.  He denies cough, wheeze, chest pain, sputum.  There is no hx of asthma, tobacco abuse.  He denies environmental or animal exposures.  He denies hx of pneumonia or exposure to tuberculosis.    He had lap band about 2 yrs ago.  He lost 40 lbs after lap band.   TESTS: PSG 07/17/13 >> AHI 3.3, SaO2 low 84%, 1 liter oxygen  PMhx >> DM, HLD, HTN, Allergies, GERD, HH  Past surgical hx, Medications, Allergies, Family hx, Social hx all reviewed.   Physical Exam: BP 140/90 mmHg  Pulse 60  Temp(Src) 98 F (36.7 C) (Oral)  Ht 5\' 10"  (1.778 m)  Wt 233 lb 9.6 oz (105.96 kg)  BMI 33.52 kg/m2  SpO2 96%  General - No distress ENT - No sinus tenderness, no oral exudate, no LAN Cardiac - s1s2 regular, no murmur Chest - No wheeze/rales/dullness Back - No focal tenderness Abd - Soft, non-tender Ext - No edema Neuro - Normal strength Skin - No rashes Psych - normal mood, and behavior   Assessment/Plan:  Obesity hypoventilation syndrome with sleep related hypoxia. Discussion: I would have expected his oxygen needs to improve/resolve with weight loss after lap band.  There aren't any other obvious causes for his nocturnal hypoxia. Plan: - will arrange for ONO with RA to recertify his home oxygen set up - continue with 2 liters oxygen at night - if his oxygen level remains low, then will need to get CXR and PFT to exclude other causes of his nocturnal hypoxia   Chesley Mires, MD [place] [facility] Pager:  [phone]

## 2015-05-14 DIAGNOSIS — G4736 Sleep related hypoventilation in conditions classified elsewhere: Secondary | ICD-10-CM | POA: Diagnosis not present

## 2015-05-19 ENCOUNTER — Telehealth: Payer: Self-pay | Admitting: Pulmonary Disease

## 2015-05-19 DIAGNOSIS — G4734 Idiopathic sleep related nonobstructive alveolar hypoventilation: Secondary | ICD-10-CM

## 2015-05-19 NOTE — Telephone Encounter (Signed)
ONO with RA 05/10/15 >> test time 7 hrs 43 min.  Basal SpO2 90.7%, low SpO2 79%.  Spent 52.2 min with SpO2 < 88%.   Will have my nurse inform pt that ONO showed oxygen level still low while asleep.  He needs to continue using supplemental oxygen while asleep.  He will need to have chest xray and PFT to further assess >> will have my nurse schedule these, and I will call him back once test results available.

## 2015-05-20 NOTE — Telephone Encounter (Signed)
Pt is aware of ONO results. Orders for the PFT and CXR have been placed. PFT has been scheduled for 06/17/2015 at 1pm. Pt will have CXR on the same day. Nothing further was needed.

## 2015-05-21 ENCOUNTER — Ambulatory Visit: Payer: BC Managed Care – PPO | Admitting: Pulmonary Disease

## 2015-05-23 ENCOUNTER — Telehealth: Payer: Self-pay | Admitting: Pulmonary Disease

## 2015-05-23 NOTE — Telephone Encounter (Signed)
Phone note entered in error 

## 2015-06-01 ENCOUNTER — Ambulatory Visit: Payer: Medicare Other | Admitting: Dietician

## 2015-06-07 ENCOUNTER — Encounter: Payer: Self-pay | Admitting: Pulmonary Disease

## 2015-06-17 ENCOUNTER — Ambulatory Visit (INDEPENDENT_AMBULATORY_CARE_PROVIDER_SITE_OTHER)
Admission: RE | Admit: 2015-06-17 | Discharge: 2015-06-17 | Disposition: A | Discharge status: Home / Self Care | Payer: Medicare Other | Source: Ambulatory Visit | Attending: Pulmonary Disease | Admitting: Pulmonary Disease

## 2015-06-17 ENCOUNTER — Ambulatory Visit (INDEPENDENT_AMBULATORY_CARE_PROVIDER_SITE_OTHER): Payer: Medicare Other | Admitting: Pulmonary Disease

## 2015-06-17 DIAGNOSIS — G4734 Idiopathic sleep related nonobstructive alveolar hypoventilation: Secondary | ICD-10-CM | POA: Diagnosis not present

## 2015-06-17 DIAGNOSIS — R0902 Hypoxemia: Secondary | ICD-10-CM | POA: Diagnosis not present

## 2015-06-17 LAB — PULMONARY FUNCTION TEST
DL/VA % PRED: 96 %
DL/VA: 4.48 ml/min/mmHg/L
DLCO UNC % PRED: 86 %
DLCO UNC: 29.01 ml/min/mmHg
FEF 25-75 Post: 4.29 L/sec
FEF 25-75 Pre: 3.41 L/sec
FEF2575-%CHANGE-POST: 25 %
FEF2575-%Pred-Post: 154 %
FEF2575-%Pred-Pre: 123 %
FEV1-%CHANGE-POST: 5 %
FEV1-%PRED-POST: 110 %
FEV1-%Pred-Pre: 104 %
FEV1-PRE: 3.69 L
FEV1-Post: 3.89 L
FEV1FVC-%CHANGE-POST: 7 %
FEV1FVC-%Pred-Pre: 107 %
FEV6-%Change-Post: -1 %
FEV6-%PRED-PRE: 102 %
FEV6-%Pred-Post: 100 %
FEV6-POST: 4.52 L
FEV6-PRE: 4.6 L
FEV6FVC-%Change-Post: 0 %
FEV6FVC-%PRED-POST: 105 %
FEV6FVC-%PRED-PRE: 104 %
FVC-%Change-Post: -2 %
FVC-%PRED-PRE: 97 %
FVC-%Pred-Post: 95 %
FVC-POST: 4.52 L
FVC-PRE: 4.62 L
POST FEV6/FVC RATIO: 100 %
PRE FEV1/FVC RATIO: 80 %
PRE FEV6/FVC RATIO: 99 %
Post FEV1/FVC ratio: 86 %

## 2015-06-17 NOTE — Progress Notes (Signed)
PFT done today. 

## 2015-06-21 ENCOUNTER — Telehealth: Payer: Self-pay | Admitting: Pulmonary Disease

## 2015-06-21 NOTE — Telephone Encounter (Signed)
Dg Chest 2 View  06/17/2015  CLINICAL DATA:  Hypoxia. EXAM: CHEST  2 VIEW COMPARISON:  Comparison made to prior study of 07/09/2013. FINDINGS: Mediastinum hilar structures normal. Lungs are clear. No pleural effusion or pneumothorax. Heart size normal. No acute bony abnormality. IMPRESSION: No acute cardiopulmonary disease.  Chest is stable from prior exam. Electronically Signed   By: Marcello Moores  Register   On: 06/17/2015 16:21    PFT 06/17/15 >> FEV1 3.89 (110%), FEV1% 86, TLC 6.06 (83%), DLCO 86%, no BD   Will have my nurse inform pt that CXR and PFT were normal.

## 2015-06-22 ENCOUNTER — Ambulatory Visit (INDEPENDENT_AMBULATORY_CARE_PROVIDER_SITE_OTHER): Payer: Medicare Other | Admitting: *Deleted

## 2015-06-22 ENCOUNTER — Telehealth: Payer: Self-pay | Admitting: Family Medicine

## 2015-06-22 DIAGNOSIS — Z23 Encounter for immunization: Secondary | ICD-10-CM

## 2015-06-23 NOTE — Telephone Encounter (Signed)
Spoke with pt and notified of results per Dr. Melvyn Novas. Pt verbalized understanding. He states that if he is to keep using o2 with sleep, he wants written rx from Dr Halford Chessman so that he can try and purchase a unit online  Please advise if okay, thanks

## 2015-06-23 NOTE — Telephone Encounter (Signed)
Patient returned call, please call him at [phone].

## 2015-06-23 NOTE — Telephone Encounter (Signed)
Okay to write script.

## 2015-06-23 NOTE — Telephone Encounter (Signed)
error 

## 2015-06-23 NOTE — Telephone Encounter (Signed)
LMTCB

## 2015-06-23 NOTE — Telephone Encounter (Signed)
Spoke with patient, states that he no longer needs a written Rx, the company he is going to buy a concentrator from is going to contact our office for a verbal request.  Pt states that they will be calling at some point this week. Nothing further needed at this time.

## 2015-07-20 ENCOUNTER — Ambulatory Visit: Payer: Medicare Other | Admitting: Dietician

## 2015-07-22 DIAGNOSIS — Z9884 Bariatric surgery status: Secondary | ICD-10-CM | POA: Diagnosis not present

## 2015-08-10 ENCOUNTER — Other Ambulatory Visit (INDEPENDENT_AMBULATORY_CARE_PROVIDER_SITE_OTHER): Payer: Medicare Other

## 2015-08-10 DIAGNOSIS — N400 Enlarged prostate without lower urinary tract symptoms: Secondary | ICD-10-CM

## 2015-08-10 DIAGNOSIS — E119 Type 2 diabetes mellitus without complications: Secondary | ICD-10-CM

## 2015-08-10 DIAGNOSIS — D649 Anemia, unspecified: Secondary | ICD-10-CM

## 2015-08-10 DIAGNOSIS — E039 Hypothyroidism, unspecified: Secondary | ICD-10-CM

## 2015-08-10 DIAGNOSIS — I10 Essential (primary) hypertension: Secondary | ICD-10-CM | POA: Diagnosis not present

## 2015-08-10 DIAGNOSIS — E785 Hyperlipidemia, unspecified: Secondary | ICD-10-CM | POA: Diagnosis not present

## 2015-08-10 LAB — CBC WITH DIFFERENTIAL/PLATELET
BASOS ABS: 0 10*3/uL (ref 0.0–0.1)
Basophils Relative: 0.5 % (ref 0.0–3.0)
EOS ABS: 0.1 10*3/uL (ref 0.0–0.7)
Eosinophils Relative: 1.9 % (ref 0.0–5.0)
HCT: 42.9 % (ref 39.0–52.0)
Hemoglobin: 14.4 g/dL (ref 13.0–17.0)
LYMPHS ABS: 1.9 10*3/uL (ref 0.7–4.0)
Lymphocytes Relative: 27.3 % (ref 12.0–46.0)
MCHC: 33.5 g/dL (ref 30.0–36.0)
MCV: 92.2 fl (ref 78.0–100.0)
MONOS PCT: 8.8 % (ref 3.0–12.0)
Monocytes Absolute: 0.6 10*3/uL (ref 0.1–1.0)
NEUTROS ABS: 4.2 10*3/uL (ref 1.4–7.7)
NEUTROS PCT: 61.5 % (ref 43.0–77.0)
PLATELETS: 345 10*3/uL (ref 150.0–400.0)
RBC: 4.65 Mil/uL (ref 4.22–5.81)
RDW: 13 % (ref 11.5–15.5)
WBC: 6.9 10*3/uL (ref 4.0–10.5)

## 2015-08-10 LAB — LIPID PANEL
CHOL/HDL RATIO: 5
CHOLESTEROL: 198 mg/dL (ref 0–200)
HDL: 36.8 mg/dL — AB (ref >39.00)
NonHDL: 161.26
TRIGLYCERIDES: 281 mg/dL — AB (ref 0.0–149.0)
VLDL: 56.2 mg/dL — AB (ref 0.0–40.0)

## 2015-08-10 LAB — POCT URINALYSIS DIPSTICK
Bilirubin, UA: NEGATIVE
GLUCOSE UA: NEGATIVE
Ketones, UA: NEGATIVE
LEUKOCYTES UA: NEGATIVE
NITRITE UA: NEGATIVE
PROTEIN UA: NEGATIVE
SPEC GRAV UA: 1.025
UROBILINOGEN UA: 0.2
pH, UA: 5.5

## 2015-08-10 LAB — BASIC METABOLIC PANEL
BUN: 12 mg/dL (ref 6–23)
CALCIUM: 9.9 mg/dL (ref 8.4–10.5)
CO2: 28 meq/L (ref 19–32)
CREATININE: 0.78 mg/dL (ref 0.40–1.50)
Chloride: 103 mEq/L (ref 96–112)
GFR: 105.79 mL/min (ref >60.00)
GLUCOSE: 156 mg/dL — AB (ref 70–99)
Potassium: 4.8 mEq/L (ref 3.5–5.1)
SODIUM: 139 meq/L (ref 135–145)

## 2015-08-10 LAB — LDL CHOLESTEROL, DIRECT: LDL DIRECT: 113 mg/dL

## 2015-08-10 LAB — MICROALBUMIN / CREATININE URINE RATIO
Creatinine,U: 204.4 mg/dL
MICROALB UR: 1.1 mg/dL (ref 0.0–1.9)
Microalb Creat Ratio: 0.5 mg/g (ref 0.0–30.0)

## 2015-08-10 LAB — PSA: PSA: 1.35 ng/mL (ref 0.10–4.00)

## 2015-08-10 LAB — HEPATIC FUNCTION PANEL
ALBUMIN: 4.3 g/dL (ref 3.5–5.2)
ALT: 19 U/L (ref 0–53)
AST: 14 U/L (ref 0–37)
Alkaline Phosphatase: 56 U/L (ref 39–117)
BILIRUBIN DIRECT: 0.1 mg/dL (ref 0.0–0.3)
BILIRUBIN TOTAL: 0.4 mg/dL (ref 0.2–1.2)
Total Protein: 7 g/dL (ref 6.0–8.3)

## 2015-08-10 LAB — HEMOGLOBIN A1C: Hgb A1c MFr Bld: 7.4 % — ABNORMAL HIGH (ref 4.6–6.5)

## 2015-08-10 LAB — TSH: TSH: 1.2 u[IU]/mL (ref 0.35–4.50)

## 2015-08-12 ENCOUNTER — Other Ambulatory Visit: Payer: Medicare Other

## 2015-08-18 ENCOUNTER — Ambulatory Visit (INDEPENDENT_AMBULATORY_CARE_PROVIDER_SITE_OTHER): Payer: Medicare Other | Admitting: Family Medicine

## 2015-08-18 DIAGNOSIS — Z0000 Encounter for general adult medical examination without abnormal findings: Secondary | ICD-10-CM | POA: Diagnosis not present

## 2015-08-18 DIAGNOSIS — I10 Essential (primary) hypertension: Secondary | ICD-10-CM | POA: Diagnosis not present

## 2015-08-18 DIAGNOSIS — G4733 Obstructive sleep apnea (adult) (pediatric): Secondary | ICD-10-CM

## 2015-08-18 DIAGNOSIS — R079 Chest pain, unspecified: Secondary | ICD-10-CM

## 2015-08-18 DIAGNOSIS — E119 Type 2 diabetes mellitus without complications: Secondary | ICD-10-CM

## 2015-08-18 DIAGNOSIS — E785 Hyperlipidemia, unspecified: Secondary | ICD-10-CM

## 2015-08-18 DIAGNOSIS — R319 Hematuria, unspecified: Secondary | ICD-10-CM

## 2015-08-18 MED ORDER — HYDROCHLOROTHIAZIDE 25 MG PO TABS: ORAL_TABLET | ORAL | Status: DC

## 2015-08-18 MED ORDER — METFORMIN HCL 500 MG PO TABS: ORAL_TABLET | ORAL | Status: DC

## 2015-08-18 MED ORDER — GLIPIZIDE 5 MG PO TABS: ORAL_TABLET | ORAL | Status: DC

## 2015-08-18 MED ORDER — LOSARTAN POTASSIUM 25 MG PO TABS: 25.0000 mg | ORAL_TABLET | Freq: Every day | ORAL | Status: DC

## 2015-08-18 NOTE — Progress Notes (Signed)
Subjective:    Patient ID: Robert Cordova, male    DOB: July 11, 1951, 65 y.o.   MRN: [id number]  HPI Robert Cordova is a 65 year old married male nonsmoker who comes in today for physical examination because a history of diabetes, hypertension, obesity, and hyperlipidemia. The classic metabolic triad.  He takes metformin 500 mg dose 2 tabs twice a day for his diabetes. He did lose weight in the past. He had a banding type gastric surgery back in 2015. Weight last year was 234 not to 25. At one point he was on insulin because his sugar was so high.  He was placed on Lipitor 20 mg daily by Dr. Raliegh Ip because of hyperlipidemia. He cannot tolerate the medicine. He developed severe muscle pain and stopped it  He takes hydrochlorothiazide 25 mg and Cozaar 25 mg daily for hypertension BP today 120/80 at goal  He uses steroid nasal spray. For allergic rhinitis and over-the-counter Rogaine for hair growth  He's also on oxygen at bedtime the his pulmonologist for sleep apnea.  Vaccinations up-to-date  Still employed in the [employer]. He seen going to [place] to advise him on how to use the machines more efficiently.  Robert Cordova underwent up to get his follow-up appointment and then came back complaining of some pressure type sensation in his chest. He had no radiation of the discomfort no diaphoresis shortness of breath etc. It was not related to exertion. However we took him back in the exam low. Cardiac exam again was normal as was his EKG. He's had a history of the gastric banding the past and some reflux so thinks it might best be that.  We talked about the symptoms of coronary artery disease and how they can be atypical in diabetics. He knows to contact me immediately or go to the local emergency room if there is any question about any discomfort in his chest.   Review of Systems  Constitutional: Negative.   HENT: Negative.   Eyes: Negative.   Respiratory: Negative.   Cardiovascular:  Negative.   Gastrointestinal: Negative.   Endocrine: Negative.   Genitourinary: Negative.   Musculoskeletal: Negative.   Skin: Negative.   Allergic/Immunologic: Negative.   Neurological: Negative.   Hematological: Negative.   Psychiatric/Behavioral: Negative.        Objective:   Physical Exam  Constitutional: He is oriented to person, place, and time. He appears well-developed and well-nourished.  HENT:  Head: Normocephalic and atraumatic.  Right Ear: External ear normal.  Left Ear: External ear normal.  Nose: Nose normal.  Mouth/Throat: Oropharynx is clear and moist.  Eyes: Conjunctivae and EOM are normal. Pupils are equal, round, and reactive to light.  Neck: Normal range of motion. Neck supple. No JVD present. No tracheal deviation present. No thyromegaly present.  Cardiovascular: Normal rate, regular rhythm, normal heart sounds and intact distal pulses.  Exam reveals no gallop and no friction rub.   No murmur heard. Pulmonary/Chest: Effort normal and breath sounds normal. No stridor. No respiratory distress. He has no wheezes. He has no rales. He exhibits no tenderness.  Abdominal: Soft. Bowel sounds are normal. He exhibits no distension and no mass. There is no tenderness. There is no rebound and no guarding.  Genitourinary: Rectum normal, prostate normal and penis normal. Guaiac negative stool. No penile tenderness.  Musculoskeletal: Normal range of motion. He exhibits no edema or tenderness.  Lymphadenopathy:    He has no cervical adenopathy.  Neurological: He is alert and oriented to person, place, and  time. He has normal reflexes. No cranial nerve deficit. He exhibits normal muscle tone.  Skin: Skin is warm and dry. No rash noted. No erythema. No pallor.  Psychiatric: He has a normal mood and affect. His behavior is normal. Judgment and thought content normal.  Nursing note and vitals reviewed.         Assessment & Plan:  Healthy male  Diabetes type 2 not at  goal......... continue metformin and Glucotrol continue diet exercise and weight loss follow-up A1c in 3 months  Hypertension ago..... Continue current therapy  Hyperlipidemia.......... side effects from Lipitor......... trial of a half a tablet Monday Wednesday Friday.  Status post gastric procedure for weight loss........ continue weight loss  Nocturnal hypoxia.......... continue oxygen and follow-up by pulmonary

## 2015-08-18 NOTE — Patient Instructions (Addendum)
Continue diet exercise and weight loss  Glucotrol 5 mg daily  fasting labs in 3 months followed by office visit 1 week after labs

## 2015-08-18 NOTE — Progress Notes (Signed)
Pre visit review using our clinic review tool, if applicable. No additional management support is needed unless otherwise documented below in the visit note. 

## 2015-10-04 ENCOUNTER — Encounter: Payer: Self-pay | Admitting: Dietician

## 2015-10-04 ENCOUNTER — Encounter: Payer: Medicare Other | Attending: Surgery | Admitting: Dietician

## 2015-10-04 DIAGNOSIS — Z029 Encounter for administrative examinations, unspecified: Secondary | ICD-10-CM | POA: Diagnosis not present

## 2015-10-04 NOTE — Progress Notes (Signed)
  Follow-up visit:  22 Months Post-Operative LAGB Surgery  Medical Nutrition Therapy:  Appt start time: 1000 end time:  1040  Primary concerns today: Post-operative Bariatric Surgery Nutrition Management. Roch returns having gained 2 pounds but is down 2.5 lbs fat per body composition scale. Has been in [place] for a month for work and has been walking more than usual. He reports that he has felt anxious to eat American foods since he has been back. Has resumed protein shakes. Has not had a fill recently. HgbA1c was elevated and started glipizide.   Surgery date: 11/25/2013 Surgery type: LAGB Start weight at [facility]: 259 on 09/01/13 (265 lbs per patient) Weight today: 220.5 lbs Weight change: 2 lbs gain Total weight lost: 44.5 lbs   TANITA  BODY COMP RESULTS  11/17/13 12/09/13 01/13/14 03/02/14 06/02/14 09/02/14 12/02/14 03/03/15 10/04/15   BMI (kg/m^2) 36 33.8 33.2 32.3 31.5 32.1 32.4 31.4 31.6   Fat Mass (lbs) 85.5 71.0 76.5 67.5 67 72.5 76 71 68.5   Fat Free Mass (lbs) 165.5 164.5 155 157.5 156 151 149.5 147.5 152   Total Body Water (lbs) 121 120.5 113.5 115.5 114 110.5 109.5 108 111.5    Preferred Learning Style:  No preference indicated   Learning Readiness:   Ready  24-hr recall: B (AM): Premier protein shake or 2-3 eggs (14-30g) Snk ( AM):   L (PM): country style steak with cabbage  Snk ( PM): pistachios  D (PM): ribs without sauce and green beans  Snk (PM): cucumber, cauliflower, pickles, olives, low carb Breyer's ice cream  Fluid intake: at least 64 oz per patient report; water with SF packets, unsweeted tea with Splenda Estimated total protein intake: 80+ grams per day  Medications: Metformin and losartan; no longer on insulin or cholesterol meds Supplementation: taking, sometimes forgets  CBG monitoring: not testing Average CBG per patient:  Last patient reported A1c: 7.4% on 08/10/15  Using straws: no Drinking while eating: occasionally Hair loss: none Carbonated  beverages: tried once or twice N/V/D/C: occasional regurgitation Last Lap-Band fill: no recent fill  Recent physical activity:  Has not been exercising recently due to travel (got a new treadmill)  Progress Towards Goal(s):  In progress.   Nutritional Diagnosis:  [place]-3.3 Overweight/obesity related to past poor dietary habits and physical inactivity as evidenced by patient w/ recent LAGB surgery following dietary guidelines for continued weight loss.     Intervention:  Nutrition counseling provided.  Teaching Method Utilized:  Visual Auditory  Barriers to learning/adherence to lifestyle change: none  Demonstrated degree of understanding via:  Teach Back   Monitoring/Evaluation:  Dietary intake, exercise, lap band fills, and body weight. Follow up in 4 months for 26 month post-op visit.

## 2015-10-04 NOTE — Patient Instructions (Addendum)
-  Get trigger foods out of the house -Keep healthier, low carb snacks on hand: deli meat, 2% cottage cheese, string cheese, pimiento cheese with [employer] protein chips or pork rinds, roasted chickpeas, soybeans -Keep walking! Aim for 7,000 steps per day -Keep eating mostly lean proteins and non starchy vegetables  TANITA  BODY COMP RESULTS  11/17/13 12/09/13 01/13/14 03/02/14 06/02/14 09/02/14 12/02/14 03/03/15 10/04/15   BMI (kg/m^2) 36 33.8 33.2 32.3 31.5 32.1 32.4 31.4 31.6   Fat Mass (lbs) 85.5 71.0 76.5 67.5 67 72.5 76 71 68.5   Fat Free Mass (lbs) 165.5 164.5 155 157.5 156 151 149.5 147.5 152   Total Body Water (lbs) 121 120.5 113.5 115.5 114 110.5 109.5 108 111.5

## 2015-10-28 DIAGNOSIS — H2513 Age-related nuclear cataract, bilateral: Secondary | ICD-10-CM | POA: Diagnosis not present

## 2015-11-09 ENCOUNTER — Other Ambulatory Visit (INDEPENDENT_AMBULATORY_CARE_PROVIDER_SITE_OTHER): Payer: Medicare Other

## 2015-11-09 DIAGNOSIS — E785 Hyperlipidemia, unspecified: Secondary | ICD-10-CM

## 2015-11-09 DIAGNOSIS — E119 Type 2 diabetes mellitus without complications: Secondary | ICD-10-CM | POA: Diagnosis not present

## 2015-11-09 LAB — LIPID PANEL
CHOLESTEROL: 123 mg/dL (ref 0–200)
HDL: 38.4 mg/dL — AB (ref >39.00)
LDL Cholesterol: 57 mg/dL (ref 0–99)
NONHDL: 84.42
TRIGLYCERIDES: 138 mg/dL (ref 0.0–149.0)
Total CHOL/HDL Ratio: 3
VLDL: 27.6 mg/dL (ref 0.0–40.0)

## 2015-11-09 LAB — BASIC METABOLIC PANEL
BUN: 11 mg/dL (ref 6–23)
CALCIUM: 9.4 mg/dL (ref 8.4–10.5)
CO2: 28 mEq/L (ref 19–32)
Chloride: 106 mEq/L (ref 96–112)
Creatinine, Ser: 0.66 mg/dL (ref 0.40–1.50)
GFR: 128.18 mL/min (ref >60.00)
Glucose, Bld: 133 mg/dL — ABNORMAL HIGH (ref 70–99)
POTASSIUM: 4.6 meq/L (ref 3.5–5.1)
SODIUM: 139 meq/L (ref 135–145)

## 2015-11-09 LAB — HEPATIC FUNCTION PANEL
ALBUMIN: 4 g/dL (ref 3.5–5.2)
ALK PHOS: 46 U/L (ref 39–117)
ALT: 15 U/L (ref 0–53)
AST: 11 U/L (ref 0–37)
Bilirubin, Direct: 0.1 mg/dL (ref 0.0–0.3)
TOTAL PROTEIN: 6.6 g/dL (ref 6.0–8.3)
Total Bilirubin: 0.4 mg/dL (ref 0.2–1.2)

## 2015-11-09 LAB — HEMOGLOBIN A1C: Hgb A1c MFr Bld: 7.2 % — ABNORMAL HIGH (ref 4.6–6.5)

## 2015-11-16 ENCOUNTER — Encounter: Payer: Self-pay | Admitting: Family Medicine

## 2015-11-16 ENCOUNTER — Ambulatory Visit (INDEPENDENT_AMBULATORY_CARE_PROVIDER_SITE_OTHER): Payer: Medicare Other | Admitting: Family Medicine

## 2015-11-16 VITALS — BP 120/80 | Temp 97.9°F | Wt 230.0 lb

## 2015-11-16 DIAGNOSIS — E785 Hyperlipidemia, unspecified: Secondary | ICD-10-CM

## 2015-11-16 DIAGNOSIS — IMO0001 Reserved for inherently not codable concepts without codable children: Secondary | ICD-10-CM

## 2015-11-16 DIAGNOSIS — E1065 Type 1 diabetes mellitus with hyperglycemia: Secondary | ICD-10-CM

## 2015-11-16 DIAGNOSIS — E109 Type 1 diabetes mellitus without complications: Secondary | ICD-10-CM | POA: Diagnosis not present

## 2015-11-16 MED ORDER — GLIPIZIDE 5 MG PO TABS: ORAL_TABLET | ORAL | Status: DC

## 2015-11-16 NOTE — Progress Notes (Signed)
   Subjective:    Patient ID: Robert Cordova, male    DOB: Jan 17, 1951, 65 y.o.   MRN: [id number]  HPI Robert Cordova is a 65 year old married male nonsmoker who comes in today for follow-up of diabetes and hyperlipidemia  His A1c 3 months ago was 7.4%. He's on metformin 1000 mg twice a day and we had a glipizide 5 mg daily. Initially the glipizide dropped his blood sugar too low so we held off and only started taking it daily for a month ago. A1c down to 7.2% blood sugar was 156 now is 133  On Lipitor 20 mg a day as total cholesterol dropped from 198-123 triglycerides dropped from 281-138 and LDL dropped from 113 do 57. LFTs normal no side effects from statin   Review of Systems    review of systems otherwise negative except he is not following his diet and is not exercising Objective:   Physical Exam  Well-developed well-nourished male no acute distress vital signs stable he is afebrile BP 120/80      Assessment & Plan:  Diabetes type 2 not at goal............[name] work on diet exercise weight loss......[name] glipizide 5 mg twice a day continue metformin 1000 mg twice a day follow-up A1c in 3 months  Hyperlipidemia at goal with LDL below 75 on 20 mg of Lipitor daily.

## 2015-11-16 NOTE — Progress Notes (Signed)
Pre visit review using our clinic review tool, if applicable. No additional management support is needed unless otherwise documented below in the visit note. 

## 2015-11-16 NOTE — Patient Instructions (Signed)
Continue the metformin 1000 mg twice daily  Glipizide 5 mg twice daily  Walk 30 minutes daily  Work heart on your diet  Follow-up nonfasting labs in 3 months

## 2016-02-01 ENCOUNTER — Ambulatory Visit: Payer: Medicare Other | Admitting: Dietician

## 2016-02-04 ENCOUNTER — Other Ambulatory Visit: Payer: Self-pay | Admitting: Family Medicine

## 2016-02-15 ENCOUNTER — Other Ambulatory Visit (INDEPENDENT_AMBULATORY_CARE_PROVIDER_SITE_OTHER): Payer: Medicare Other

## 2016-02-15 DIAGNOSIS — Z1159 Encounter for screening for other viral diseases: Secondary | ICD-10-CM

## 2016-02-15 DIAGNOSIS — E109 Type 1 diabetes mellitus without complications: Secondary | ICD-10-CM | POA: Diagnosis not present

## 2016-02-15 DIAGNOSIS — E1065 Type 1 diabetes mellitus with hyperglycemia: Secondary | ICD-10-CM

## 2016-02-15 DIAGNOSIS — IMO0001 Reserved for inherently not codable concepts without codable children: Secondary | ICD-10-CM

## 2016-02-15 LAB — BASIC METABOLIC PANEL
BUN: 13 mg/dL (ref 6–23)
CHLORIDE: 104 meq/L (ref 96–112)
CO2: 27 meq/L (ref 19–32)
CREATININE: 0.85 mg/dL (ref 0.40–1.50)
Calcium: 9.8 mg/dL (ref 8.4–10.5)
GFR: 95.65 mL/min (ref >60.00)
Glucose, Bld: 153 mg/dL — ABNORMAL HIGH (ref 70–99)
POTASSIUM: 4.7 meq/L (ref 3.5–5.1)
SODIUM: 138 meq/L (ref 135–145)

## 2016-02-15 LAB — HEMOGLOBIN A1C: HEMOGLOBIN A1C: 7.6 % — AB (ref 4.6–6.5)

## 2016-02-16 LAB — HEPATITIS C ANTIBODY: HCV AB: NEGATIVE

## 2016-02-25 ENCOUNTER — Other Ambulatory Visit: Payer: Self-pay | Admitting: Family Medicine

## 2016-02-25 MED ORDER — ATORVASTATIN CALCIUM 20 MG PO TABS: 20.0000 mg | ORAL_TABLET | Freq: Every day | ORAL | Status: DC

## 2016-02-28 IMAGING — DX DG CHEST 2V
2 series · 2 of 2 positions shown · non-contrast
Comparison: Comparison made to prior study of 07/09/2013.

CLINICAL DATA: Hypoxia.

EXAM:
CHEST  2 VIEW

[chest pa]
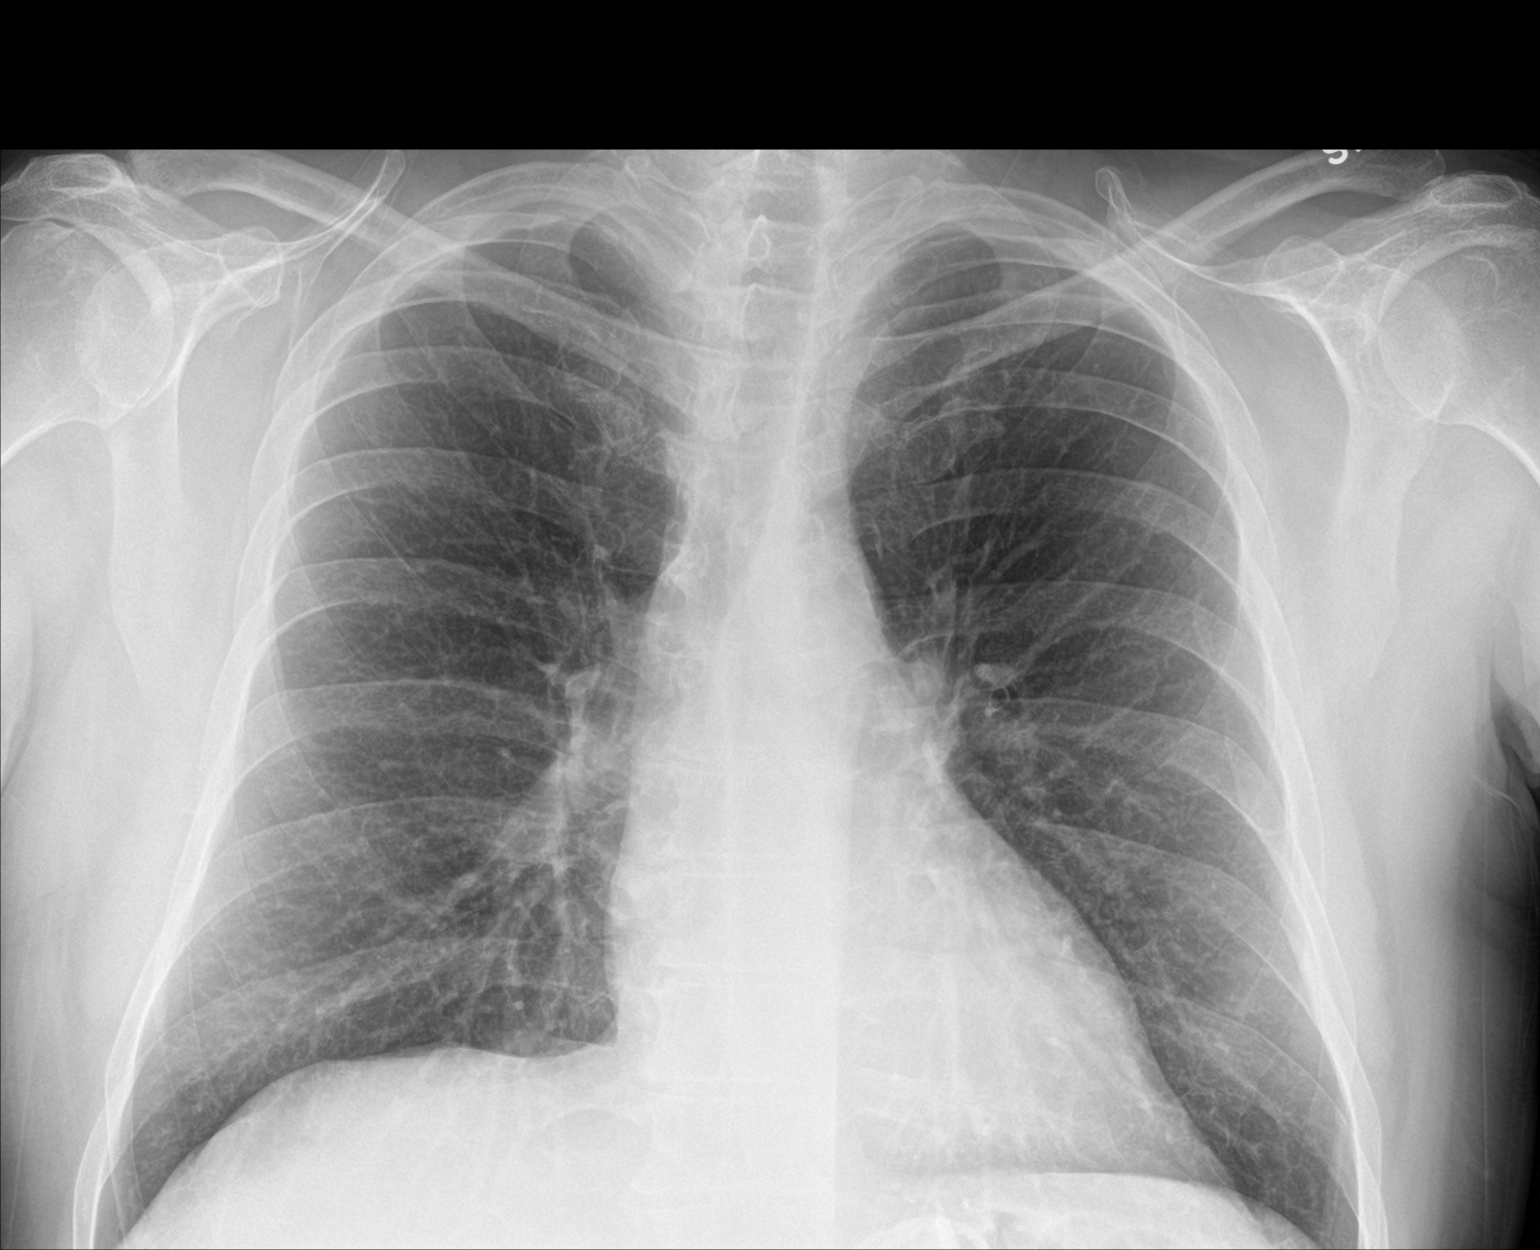

[chest lat]
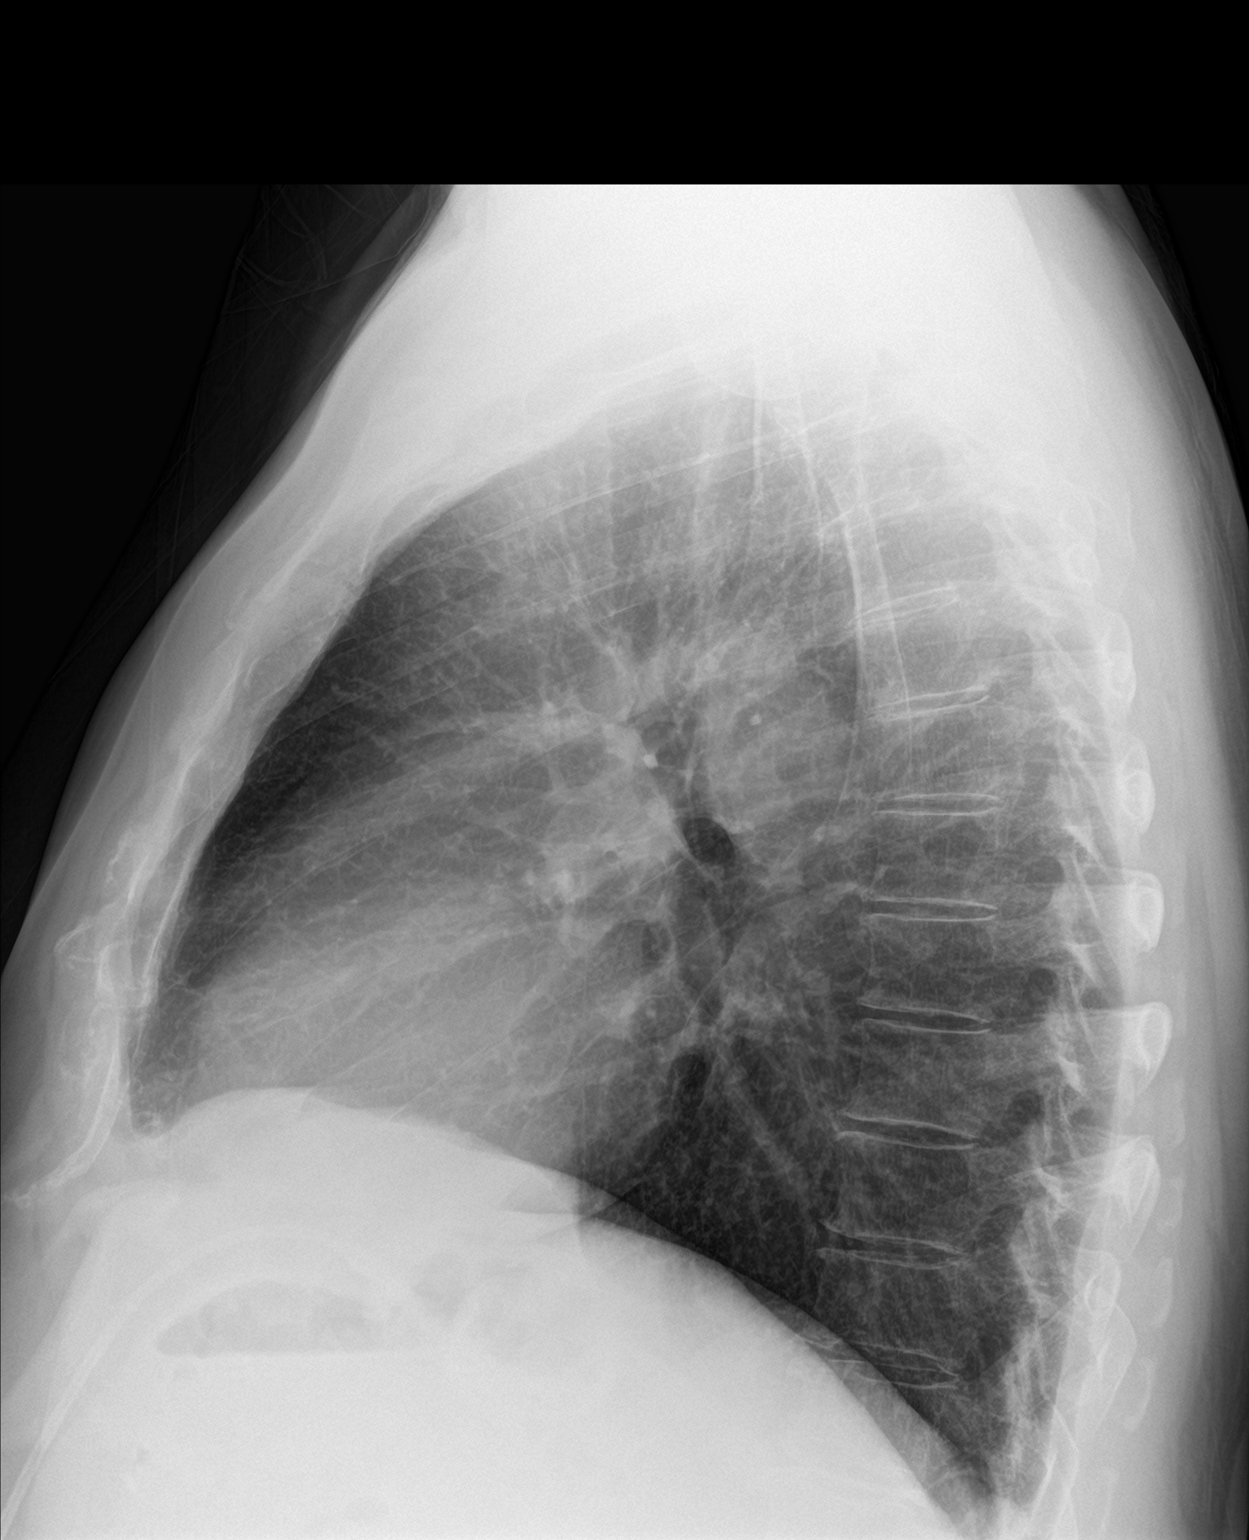

[2 of 2 positions shown; findings below may reference images not displayed]

FINDINGS: Mediastinum hilar structures normal. Lungs are clear. No pleural
effusion or pneumothorax. Heart size normal. No acute bony
abnormality.
IMPRESSION: No acute cardiopulmonary disease.  Chest is stable from prior exam.

## 2016-05-01 ENCOUNTER — Telehealth: Payer: Self-pay | Admitting: Family Medicine

## 2016-05-01 NOTE — Telephone Encounter (Signed)
Pt states [facility] advised him no more refill on his metFORMIN (GLUCOPHAGE) 500 MG tablet  [facility]  Pt states he has plenty now, but received letter.

## 2016-05-05 ENCOUNTER — Other Ambulatory Visit: Payer: Self-pay | Admitting: Emergency Medicine

## 2016-05-05 MED ORDER — METFORMIN HCL 500 MG PO TABS: ORAL_TABLET | ORAL | 3 refills | Status: DC

## 2016-05-05 NOTE — Telephone Encounter (Signed)
Sent new prescription to pharmacy

## 2016-05-10 ENCOUNTER — Other Ambulatory Visit: Payer: Self-pay

## 2016-05-11 ENCOUNTER — Telehealth: Payer: Self-pay | Admitting: Family Medicine

## 2016-05-11 NOTE — Telephone Encounter (Signed)
Pt needs new rx metformin 500 mg twice a day #360 for 90 day supply w/refills  send to [facility] order

## 2016-05-16 NOTE — Telephone Encounter (Signed)
Per Dr. Sherren Mocha pt is to take 500 mg twice daily  By mouth with meals. Called and spoke with [facility] pharmacist @ [facility]  informing her of Dr.Todd's instructions. Rosann Auerbach verbalized understanding.

## 2016-05-16 NOTE — Telephone Encounter (Signed)
Please call [facility] with  Directions on metformin [phone] ref# [id number]

## 2016-05-16 NOTE — Telephone Encounter (Signed)
disregard

## 2016-05-29 ENCOUNTER — Other Ambulatory Visit: Payer: Self-pay | Admitting: Family Medicine

## 2016-05-30 ENCOUNTER — Ambulatory Visit (INDEPENDENT_AMBULATORY_CARE_PROVIDER_SITE_OTHER): Payer: Medicare Other | Admitting: Emergency Medicine

## 2016-05-30 DIAGNOSIS — Z23 Encounter for immunization: Secondary | ICD-10-CM

## 2016-07-26 ENCOUNTER — Other Ambulatory Visit: Payer: Self-pay | Admitting: Family Medicine

## 2016-08-14 ENCOUNTER — Ambulatory Visit: Payer: Medicare Other | Admitting: Family Medicine

## 2016-08-15 ENCOUNTER — Ambulatory Visit (INDEPENDENT_AMBULATORY_CARE_PROVIDER_SITE_OTHER): Payer: Medicare Other

## 2016-08-15 VITALS — BP 138/70 | HR 67 | Ht 70.0 in | Wt 228.6 lb

## 2016-08-15 DIAGNOSIS — Z0000 Encounter for general adult medical examination without abnormal findings: Secondary | ICD-10-CM

## 2016-08-15 NOTE — Progress Notes (Signed)
Subjective:   Robert Cordova is a 66 y.o. male who presents for an Initial Medicare Annual Wellness Visit.  The Patient was informed that the wellness visit is to identify future health risk and educate and initiate measures that can reduce risk for increased disease through the lifespan.    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 Lives with spouse Oldest son lives with them and is bipolar The  other son lives in town  States he has pain in his hands;  Has been x-rayed  c/o of pain in shoulders; surgery recommended   No exercises given at this time  States more sob; steps are challenging; denies chest pain;   Is on oxygen at  hs; followed byr Dr Halford Chessman in pulmonology  Is on 2 liters just at hs;  Gets up 2 to 3 times to void but goes back to sleep  States he does sweat has hs but is better than it was Hx of gastric bypass but not to compliant with diet recently Sits and eats snacks   Non essential tremors increasing and becoming more problematic when typing   The patient has apt with Dr. Sherren Mocha to fup on the above  The following written screening schedule of preventive measures were reviewed with assessment and plan made per below and patient instructions:  Smoking history reviewed/ never smoked  Use of Smokeless tobacco no Second Hand Smoke status; No Smokers in the home Dental fup   ETOH NO   RISK FACTORS Regular exercise- educated regarding active lifestyle according to personal preferences/ hdl 38 Stopped exercising; states he does "minimal" now;  Walks around the store at times;   Diet;  incorporates fruits and vegetables;  Changes in the diet due to pain and not as much activity Breakfast; eats some; protein shake;  Lunch; Usually; sausage or eggs; (he gets up early and when the family gets up he  eats "breakfast")  Dinner; fast food or does cook as well  Vegetables; 2 to 4 a day  Fruits; tries to stay away Carbs; eating more now than normal;  Sit and watch TV and eats a snack; chips   Obesity and weight loss plan discussed  Lipids reviewed and reducing cholesterol discussed  A1c 7.6 / BS in am; 200 in am;does not check in the evening Has been diabetes x 6 to 7 years  Hx of gastric band x 2 to 66 yo;  215 lb at one time   Fall risk: presented mobile; get up and go WNL  Mobility of Functional changes this year? Yes due to fatigue; shoulders issues etc "does not have much energy;"  Does not feel like exercising  Coached to think about what he can do to assist him in achieving his goals Would like to lose another 30 lbs Exercise and weight loss; Coached in development of plan to exercise qd at 11am  Reviewed 10lb weight loss would equal 1" in waist and assist with lowering his A1c   Depression Screen Denies depression Feels he has some minor changes in memory;  Mentions non essential tremor; notices in his typing  No changes in his actual ability to complete work etc  PhQ 2: negative  Activities of Daily Living - See functional screen   Cognitive testing; Ad8 score; 0 or less than 2  MMSE deferred or completed if AD8 + 2 issues   Hearing Screening   125Hz  250Hz  500Hz  1000Hz  2000Hz  3000Hz  4000Hz  6000Hz  8000Hz   Right ear:       100    Left ear:       100    Vision Screening Comments: Eye exams;  Goes one time a year Can tell his is having more difficulty reading  [place] [address] [facility];  Dr. Tommy Rainwater     Advanced Directives reviewed for completion; discussion with MD as well as supportive resources as needed Agreed to take AD from [facility]; given resources   Patient Care Team: Dorena Cookey, MD as PCP - General [facility]: Dr. Johnn Hai Optometry Dr. Halford Chessman; pulmonology Dr. Johnathan Hausen in general surgery   Preventives  screens reviewed  Colonoscopy 08/2012 and due 08/2017  PSA 08/2015 (1.35)   Immunization History  Administered Date(s) Administered  . Influenza Split 07/13/2011, 05/15/2012  . Influenza Whole 05/09/2008, 05/10/2009, 04/18/2010  . Influenza, High Dose Seasonal PF 05/30/2016  . Influenza,inj,Quad PF,36+ Mos 04/22/2013, 05/07/2014, 05/07/2015  . Pneumococcal Conjugate-13 05/07/2014  . Pneumococcal Polysaccharide-23 06/22/2015  . Td 08/07/2002, 08/07/2006  . Tdap 05/07/2014  . Zoster 01/30/2011   Required Immunizations needed today  Screening test up to date or reviewed for plan of completion Health Maintenance Due  Topic Date Due  . OPHTHALMOLOGY EXAM  09/16/2015   Agreed to schedule eye exam       Objective:    Today's Vitals   08/15/16 1112  BP: 138/70  Pulse: 67  SpO2: 98%  Weight: 228 lb 9 oz (103.7 kg)  Height: 5\' 10"  (1.778 m)   Body mass index is 32.8 kg/m.  Current Medications (verified) Outpatient Encounter Prescriptions as of 08/15/2016  Medication Sig  . aspirin 81 MG tablet Take 1 tablet (81 mg total) by mouth daily.  [name] atorvastatin (LIPITOR) 20 MG tablet TAKE 1 TABLET EVERY DAY  . fluticasone (FLONASE) 50 MCG/ACT nasal spray USE 1 SPRAY IN EACH NOSTRIL EVERY DAY  . glipiZIDE (GLUCOTROL) 5 MG tablet TAKE 1 TABLET TWICE DAILY  . losartan (COZAAR) 25 MG tablet TAKE 1 TABLET EVERY DAY  . metFORMIN (GLUCOPHAGE) 500 MG tablet TAKE 2 TABLETs(500 MG TOTAL) BY MOUTH 2 (TWO) TIMES DAILY WITH A MEAL.  . minoxidil (ROGAINE) 2 % external solution Apply 1 application topically daily.  Donell Sievert IN Inhale into the lungs.  . hydrochlorothiazide (HYDRODIURIL) 25 MG tablet TAKE 1 TABLET (25 MG TOTAL) BY MOUTH DAILY. (Patient not taking: Reported on 08/15/2016)   No facility-administered encounter medications on file as of 08/15/2016.     Allergies (verified) Ace inhibitors   History: Past Medical History:  Diagnosis Date  . ABNORMAL GLUCOSE NEC 03/18/2007  .  Arthritis    fingers  . BLEPHARITIS, LEFT 10/29/2009  . DIABETES-TYPE 2 04/18/2010  . Dry throat   . Environmental allergies   . GERD (gastroesophageal reflux disease)   . H/O hiatal hernia   . HYPERLIPIDEMIA 10/22/2007  . HYPERTENSION 03/18/2007  . Multiple thyroid nodules    "biopsy done, everything was fine" being monitored  . NEVUS, MELANOCYTIC, FACE 10/07/2009   Past Surgical History:  Procedure Laterality Date  . BIOPSY THYROID  ~08/2013  . BREATH TEK H PYLORI N/A 09/05/2013   Procedure: BREATH TEK H PYLORI;  Surgeon: [name], MD;  Location: [facility];  Service: General;  Laterality: N/A;  . COLONOSCOPY  2009, 2013   Dr. Sharlett Iles; multiple  polyps  . HIATAL HERNIA REPAIR  11/25/2013   Procedure: LAPAROSCOPIC REPAIR OF HIATAL HERNIA;  Surgeon: [name], MD;  Location: [facility];  Service: General;;  . LAPAROSCOPIC GASTRIC BANDING N/A 11/25/2013   Procedure: LAPAROSCOPIC GASTRIC BANDING;  Surgeon: [name], MD;  Location: [facility];  Service: General;  Laterality: N/A;  . NO PAST SURGERIES     Family History  Problem Relation Age of Onset  . Hyperlipidemia Mother   . Hypertension Mother   . Hyperlipidemia Father   . Hypertension Father   . Heart attack Father   . Hyperlipidemia Brother   . Diabetes Maternal Grandmother   . Diabetes Maternal Grandfather   . Colon cancer Neg Hx   . Stomach cancer Neg Hx   . Esophageal cancer Neg Hx   . Rectal cancer Neg Hx    Social History   Occupational History  . Consultant-- self-employeed    Social History Main Topics  . Smoking status: Never Smoker  . Smokeless tobacco: Never Used  . Alcohol use No  . Drug use: No  . Sexual activity: Not on file   Tobacco Counseling Counseling given: Yes   Activities of Daily Living In your present state of health, do you have any difficulty performing the following activities: 08/15/2016  Hearing? N  Vision? N  Some recent data might be hidden    Immunizations and  Health Maintenance Immunization History  Administered Date(s) Administered  . Influenza Split 07/13/2011, 05/15/2012  . Influenza Whole 05/09/2008, 05/10/2009, 04/18/2010  . Influenza, High Dose Seasonal PF 05/30/2016  . Influenza,inj,Quad PF,36+ Mos 04/22/2013, 05/07/2014, 05/07/2015  . Pneumococcal Conjugate-13 05/07/2014  . Pneumococcal Polysaccharide-23 06/22/2015  . Td 08/07/2002, 08/07/2006  . Tdap 05/07/2014  . Zoster 01/30/2011   Health Maintenance Due  Topic Date Due  . OPHTHALMOLOGY EXAM  09/16/2015    Patient Care Team: Dorena Cookey, MD as PCP - General  Indicate any recent Medical Services you may have received from other than [facility] providers in the past year (date may be approximate).    Assessment:   This is a routine wellness examination for Quentavious.   Hearing/Vision screen  Hearing Screening   125Hz  250Hz  500Hz  1000Hz  2000Hz  3000Hz  4000Hz  6000Hz  8000Hz   Right ear:       100    Left ear:       100    Vision Screening Comments: Eye exams;  Goes one time a year Can tell his is having more difficulty reading  [place] [address]; [facility]"  Dr. Tommy Rainwater   Dietary issues and exercise activities discussed:    Goals    . patient          May increase exercise; has equipment in home;  Has a treadmill; has rowing machine and may try this     . Weight (lb) < 215 lb (97.5 kg)          Exercise more now  Check out  online nutrition programs as GumSearch.nl and http://vang.com/; fit49me; Look for foods with "whole" wheat; bran; oatmeal etc Shot at the farmer's markets in season for fresher choices  Watch for "hydrogenated" on the label of oils which are trans-fats.  Watch for "high fructose corn syrup" in snacks, yogurt or ketchup  Meats have less marbling; bright colored fruits and vegetables;  Canned; dump out liquid and wash vegetables. Be mindful of what we are eating  Portion control is essential to a health weight! Sit  down;  take a break and enjoy your meal; take smaller bites; put the fork down between bites;  It takes 20 minutes to get full; so check in with your fullness cues and stop eating when you start to fill full             Depression Screen PHQ 2/9 Scores 08/15/2016 10/04/2015 03/03/2015 08/11/2014  PHQ - 2 Score 0 0 0 0    Fall Risk Fall Risk  08/15/2016 10/04/2015 03/03/2015 08/11/2014  Falls in the past year? No No No No    Cognitive Function:      6CIT Screen 08/15/2016  What Year? 0 points  What month? 0 points  What time? 0 points  Count back from 20 0 points  Months in reverse 0 points  Repeat phrase 0 points  Total Score 0    Screening Tests Health Maintenance  Topic Date Due  . OPHTHALMOLOGY EXAM  09/16/2015  . HEMOGLOBIN A1C  08/17/2016  . FOOT EXAM  11/15/2016  . COLONOSCOPY  09/02/2017  . TETANUS/TDAP  05/07/2024  . INFLUENZA VACCINE  Completed  . ZOSTAVAX  Completed  . Hepatitis C Screening  Completed  . PNA vac Low Risk Adult  Completed        Plan:   PCP Notes  Health Maintenance To schedule eye exam soon/will leave in overdue HM until completed    Abnormal Screens none  Referrals none  Patient concerns; To see Dr. Sherren Mocha next week; C/o of fatigue; more sob with exertion; (stairs)  No chest pain  BS elevated in the am; (sometimes 200 range)  Generalized tremors slightly worse  Continues to c/o of pain in shoulders and hands;   Nurse Concerns; Not exercising; agreed to start walking every day at 11am  Will cut back from overeating and snacking  Continues on Oxygen at hs due to nocturnal hypoxia; He questions why he has this.   Next PCP apt 01/16   During the course of the visit Guy was educated and counseled about the following appropriate screening and preventive services:   Vaccines to include Pneumoccal, Influenza, Hepatitis B, Td, Zostavax, HCV  Electrocardiogram  Colorectal cancer screening   Cardiovascular disease  screening  Diabetes screening  Glaucoma screening  Nutrition counseling  Prostate cancer screening  Smoking cessation counseling  Patient Instructions (the written plan) were given to the patient.   Wynetta Fines, RN   08/15/2016

## 2016-08-15 NOTE — Patient Instructions (Addendum)
Mr. Strayer , Thank you for taking time to come for your Medicare Wellness Visit. I appreciate your ongoing commitment to your health goals. Please review the following plan we discussed and let me know if I can assist you in the future.   Will schedule an eye exam soon  Will schedule daily exercise at 11am   These are the goals we discussed: Goals    . patient          May increase exercise; has equipment in home;  Has a treadmill; has rowing machine and may try this     . Weight (lb) < 215 lb (97.5 kg)          Exercise more now  Check out  online nutrition programs as GumSearch.nl and http://vang.com/; fit70me; Look for foods with "whole" wheat; bran; oatmeal etc Shot at the farmer's markets in season for fresher choices  Watch for "hydrogenated" on the label of oils which are trans-fats.  Watch for "high fructose corn syrup" in snacks, yogurt or ketchup  Meats have less marbling; bright colored fruits and vegetables;  Canned; dump out liquid and wash vegetables. Be mindful of what we are eating  Portion control is essential to a health weight! Sit down; take a break and enjoy your meal; take smaller bites; put the fork down between bites;  It takes 20 minutes to get full; so check in with your fullness cues and stop eating when you start to fill full              This is a list of the screening recommended for you and due dates:  Health Maintenance  Topic Date Due  . Eye exam for diabetics  09/16/2015  . Hemoglobin A1C  08/17/2016  . Complete foot exam   11/15/2016  . Colon Cancer Screening  09/02/2017  . Tetanus Vaccine  05/07/2024  . Flu Shot  Completed  . Shingles Vaccine  Completed  .  Hepatitis C: One time screening is recommended by Center for Disease Control  (CDC) for  adults born from [birth year] through 1967.   Completed  . Pneumonia vaccines  Completed        Fall Prevention in the Home Introduction Falls can cause injuries. They can happen  to people of all ages. There are many things you can do to make your home safe and to help prevent falls. What can I do on the outside of my home?  Regularly fix the edges of walkways and driveways and fix any cracks.  Remove anything that might make you trip as you walk through a door, such as a raised step or threshold.  Trim any bushes or trees on the path to your home.  Use bright outdoor lighting.  Clear any walking paths of anything that might make someone trip, such as rocks or tools.  Regularly check to see if handrails are loose or broken. Make sure that both sides of any steps have handrails.  Any raised decks and porches should have guardrails on the edges.  Have any leaves, snow, or ice cleared regularly.  Use sand or salt on walking paths during winter.  Clean up any spills in your garage right away. This includes oil or grease spills. What can I do in the bathroom?  Use night lights.  Install grab bars by the toilet and in the tub and shower. Do not use towel bars as grab bars.  Use non-skid mats or decals in the tub or shower.  If you need to sit down in the shower, use a plastic, non-slip stool.  Keep the floor dry. Clean up any water that spills on the floor as soon as it happens.  Remove soap buildup in the tub or shower regularly.  Attach bath mats securely with double-sided non-slip rug tape.  Do not have throw rugs and other things on the floor that can make you trip. What can I do in the bedroom?  Use night lights.  Make sure that you have a light by your bed that is easy to reach.  Do not use any sheets or blankets that are too big for your bed. They should not hang down onto the floor.  Have a firm chair that has side arms. You can use this for support while you get dressed.  Do not have throw rugs and other things on the floor that can make you trip. What can I do in the kitchen?  Clean up any spills right away.  Avoid walking on wet  floors.  Keep items that you use a lot in easy-to-reach places.  If you need to reach something above you, use a strong step stool that has a grab bar.  Keep electrical cords out of the way.  Do not use floor polish or wax that makes floors slippery. If you must use wax, use non-skid floor wax.  Do not have throw rugs and other things on the floor that can make you trip. What can I do with my stairs?  Do not leave any items on the stairs.  Make sure that there are handrails on both sides of the stairs and use them. Fix handrails that are broken or loose. Make sure that handrails are as long as the stairways.  Check any carpeting to make sure that it is firmly attached to the stairs. Fix any carpet that is loose or worn.  Avoid having throw rugs at the top or bottom of the stairs. If you do have throw rugs, attach them to the floor with carpet tape.  Make sure that you have a light switch at the top of the stairs and the bottom of the stairs. If you do not have them, ask someone to add them for you. What else can I do to help prevent falls?  Wear shoes that:  Do not have high heels.  Have rubber bottoms.  Are comfortable and fit you well.  Are closed at the toe. Do not wear sandals.  If you use a stepladder:  Make sure that it is fully opened. Do not climb a closed stepladder.  Make sure that both sides of the stepladder are locked into place.  Ask someone to hold it for you, if possible.  Clearly mark and make sure that you can see:  Any grab bars or handrails.  First and last steps.  Where the edge of each step is.  Use tools that help you move around (mobility aids) if they are needed. These include:  Canes.  Walkers.  Scooters.  Crutches.  Turn on the lights when you go into a dark area. Replace any light bulbs as soon as they burn out.  Set up your furniture so you have a clear path. Avoid moving your furniture around.  If any of your floors are  uneven, fix them.  If there are any pets around you, be aware of where they are.  Review your medicines with your doctor. Some medicines can make you feel dizzy. This  can increase your chance of falling. Ask your doctor what other things that you can do to help prevent falls. This information is not intended to replace advice given to you by your health care provider. Make sure you discuss any questions you have with your health care provider. Document Released: 05/20/2009 Document Revised: 12/30/2015 Document Reviewed: 08/28/2014  2017 Elsevier  Health Maintenance, Male A healthy lifestyle and preventative care can promote health and wellness.  Maintain regular health, dental, and eye exams.  Eat a healthy diet. Foods like vegetables, fruits, whole grains, low-fat dairy products, and lean protein foods contain the nutrients you need and are low in calories. Decrease your intake of foods high in solid fats, added sugars, and salt. Get information about a proper diet from your health care provider, if necessary.  Regular physical exercise is one of the most important things you can do for your health. Most adults should get at least 150 minutes of moderate-intensity exercise (any activity that increases your heart rate and causes you to sweat) each week. In addition, most adults need muscle-strengthening exercises on 2 or more days a week.   Maintain a healthy weight. The body mass index (BMI) is a screening tool to identify possible weight problems. It provides an estimate of body fat based on height and weight. Your health care provider can find your BMI and can help you achieve or maintain a healthy weight. For males 20 years and older:  A BMI below 18.5 is considered underweight.  A BMI of 18.5 to 24.9 is normal.  A BMI of 25 to 29.9 is considered overweight.  A BMI of 30 and above is considered obese.  Maintain normal blood lipids and cholesterol by exercising and minimizing your  intake of saturated fat. Eat a balanced diet with plenty of fruits and vegetables. Blood tests for lipids and cholesterol should begin at age 6 and be repeated every 5 years. If your lipid or cholesterol levels are high, you are over age 62, or you are at high risk for heart disease, you may need your cholesterol levels checked more frequently.Ongoing high lipid and cholesterol levels should be treated with medicines if diet and exercise are not working.  If you smoke, find out from your health care provider how to quit. If you do not use tobacco, do not start.  Lung cancer screening is recommended for adults aged 67-80 years who are at high risk for developing lung cancer because of a history of smoking. A yearly low-dose CT scan of the lungs is recommended for people who have at least a 30-pack-year history of smoking and are current smokers or have quit within the past 15 years. A pack year of smoking is smoking an average of 1 pack of cigarettes a day for 1 year (for example, a 30-pack-year history of smoking could mean smoking 1 pack a day for 30 years or 2 packs a day for 15 years). Yearly screening should continue until the smoker has stopped smoking for at least 15 years. Yearly screening should be stopped for people who develop a health problem that would prevent them from having lung cancer treatment.  If you choose to drink alcohol, do not have more than 2 drinks per day. One drink is considered to be 12 oz (360 mL) of beer, 5 oz (150 mL) of wine, or 1.5 oz (45 mL) of liquor.  Avoid the use of street drugs. Do not share needles with anyone. Ask for help if you  need support or instructions about stopping the use of drugs.  High blood pressure causes heart disease and increases the risk of stroke. High blood pressure is more likely to develop in:  People who have blood pressure in the end of the normal range (100-139/85-89 mm Hg).  People who are overweight or obese.  People who are  African American.  If you are 32-42 years of age, have your blood pressure checked every 3-5 years. If you are 46 years of age or older, have your blood pressure checked every year. You should have your blood pressure measured twice-once when you are at a hospital or clinic, and once when you are not at a hospital or clinic. Record the average of the two measurements. To check your blood pressure when you are not at a hospital or clinic, you can use:  An automated blood pressure machine at a pharmacy.  A home blood pressure monitor.  If you are 16-56 years old, ask your health care provider if you should take aspirin to prevent heart disease.  Diabetes screening involves taking a blood sample to check your fasting blood sugar level. This should be done once every 3 years after age 2 if you are at a normal weight and without risk factors for diabetes. Testing should be considered at a younger age or be carried out more frequently if you are overweight and have at least 1 risk factor for diabetes.  Colorectal cancer can be detected and often prevented. Most routine colorectal cancer screening begins at the age of 32 and continues through age 54. However, your health care provider may recommend screening at an earlier age if you have risk factors for colon cancer. On a yearly basis, your health care provider may provide home test kits to check for hidden blood in the stool. A small camera at the end of a tube may be used to directly examine the colon (sigmoidoscopy or colonoscopy) to detect the earliest forms of colorectal cancer. Talk to your health care provider about this at age 51 when routine screening begins. A direct exam of the colon should be repeated every 5-10 years through age 68, unless early forms of precancerous polyps or small growths are found.  People who are at an increased risk for hepatitis B should be screened for this virus. You are considered at high risk for hepatitis B  if:  You were born in a country where hepatitis B occurs often. Talk with your health care provider about which countries are considered high risk.  Your parents were born in a high-risk country and you have not received a shot to protect against hepatitis B (hepatitis B vaccine).  You have HIV or AIDS.  You use needles to inject street drugs.  You live with, or have sex with, someone who has hepatitis B.  You are a man who has sex with other men (MSM).  You get hemodialysis treatment.  You take certain medicines for conditions like cancer, organ transplantation, and autoimmune conditions.  Hepatitis C blood testing is recommended for all people born from [birth year] through 1967 and any individual with known risk factors for hepatitis C.  Healthy men should no longer receive prostate-specific antigen (PSA) blood tests as part of routine cancer screening. Talk to your health care provider about prostate cancer screening.  Testicular cancer screening is not recommended for adolescents or adult males who have no symptoms. Screening includes self-exam, a health care provider exam, and other screening tests. Consult with  your health care provider about any symptoms you have or any concerns you have about testicular cancer.  Practice safe sex. Use condoms and avoid high-risk sexual practices to reduce the spread of sexually transmitted infections (STIs).  You should be screened for STIs, including gonorrhea and chlamydia if:  You are sexually active and are younger than 24 years.  You are older than 24 years, and your health care provider tells you that you are at risk for this type of infection.  Your sexual activity has changed since you were last screened, and you are at an increased risk for chlamydia or gonorrhea. Ask your health care provider if you are at risk.  If you are at risk of being infected with HIV, it is recommended that you take a prescription medicine daily to prevent HIV  infection. This is called pre-exposure prophylaxis (PrEP). You are considered at risk if:  You are a man who has sex with other men (MSM).  You are a heterosexual man who is sexually active with multiple partners.  You take drugs by injection.  You are sexually active with a partner who has HIV.  Talk with your health care provider about whether you are at high risk of being infected with HIV. If you choose to begin PrEP, you should first be tested for HIV. You should then be tested every 3 months for as long as you are taking PrEP.  Use sunscreen. Apply sunscreen liberally and repeatedly throughout the day. You should seek shade when your shadow is shorter than you. Protect yourself by wearing long sleeves, pants, a wide-brimmed hat, and sunglasses year round whenever you are outdoors.  Tell your health care provider of new moles or changes in moles, especially if there is a change in shape or color. Also, tell your health care provider if a mole is larger than the size of a pencil eraser.  A one-time screening for abdominal aortic aneurysm (AAA) and surgical repair of large AAAs by ultrasound is recommended for men aged 76-75 years who are current or former smokers.  Stay current with your vaccines (immunizations). This information is not intended to replace advice given to you by your health care provider. Make sure you discuss any questions you have with your health care provider. Document Released: 01/20/2008 Document Revised: 08/14/2014 Document Reviewed: 04/27/2015 Elsevier Interactive Patient Education  2017 [employer].

## 2016-08-15 NOTE — Progress Notes (Signed)
Reviewed citizens evaluation and concur with her findings

## 2016-08-22 ENCOUNTER — Encounter: Payer: Self-pay | Admitting: Family Medicine

## 2016-08-22 ENCOUNTER — Ambulatory Visit (INDEPENDENT_AMBULATORY_CARE_PROVIDER_SITE_OTHER): Payer: Medicare Other | Admitting: Family Medicine

## 2016-08-22 VITALS — BP 132/62 | HR 66 | Temp 97.4°F | Ht 70.0 in | Wt 234.6 lb

## 2016-08-22 DIAGNOSIS — E1310 Other specified diabetes mellitus with ketoacidosis without coma: Secondary | ICD-10-CM | POA: Diagnosis not present

## 2016-08-22 DIAGNOSIS — E1110 Type 2 diabetes mellitus with ketoacidosis without coma: Secondary | ICD-10-CM

## 2016-08-22 MED ORDER — GLIPIZIDE 10 MG PO TABS: 10.0000 mg | ORAL_TABLET | Freq: Two times a day (BID) | ORAL | 5 refills | Status: DC

## 2016-08-22 NOTE — Progress Notes (Signed)
Robert Cordova is a 66 year old married male nonsmoker who comes in today for follow-up of diabetes  He notices blood sugar went up about 3 months ago and increased his Glucotrol to 10 mg twice a day metformin is 1000 mg twice a day however his blood sugars remained in the 200 range. He one time before he lost weight he was on insulin. He lost weight and we will take him off the insulin. He follows a fairly strict diet but does not exercise daily until recently. He's been walking about 3 days a week.  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 He's been seen the past and told he had a rotator cuff injury. He would like that pursued.  A1c today 7.7%BP 132/62 (BP Location: Right Arm, Patient Position: Sitting, Cuff Size: Normal)   Pulse 66   Temp 97.4 F (36.3 C) (Oral)   Ht 5\' 10"  (1.778 m)   Wt 234 lb 9.6 oz (106.4 kg)   BMI 33.66 kg/m  General he is well-developed well-nourished male no acute distress vital signs stable BP good 132/62  Diabetes not at goal A1c 7.7 on max dose of glipizide and metformin....... stressed diet.... Exercise........ continue oral meds....... consult with Dr. Lorie Apley to see if the new oral meds may be of value for him or we should just put him on insulin like he was in the past  #2 bilateral shoulder pain.......... consult with Dr. Joni Fears  #3 benign intentional tremor

## 2016-08-22 NOTE — Patient Instructions (Signed)
Continue diet............ no sugar  Walk 30 minutes daily  Continue oral meds  Consult with Dr. Lorie Apley ASAP

## 2016-10-13 ENCOUNTER — Ambulatory Visit (INDEPENDENT_AMBULATORY_CARE_PROVIDER_SITE_OTHER): Payer: Medicare Other | Admitting: Internal Medicine

## 2016-10-13 ENCOUNTER — Encounter: Payer: Self-pay | Admitting: Internal Medicine

## 2016-10-13 VITALS — BP 138/82 | HR 79 | Ht 70.5 in | Wt 234.0 lb

## 2016-10-13 DIAGNOSIS — E1310 Other specified diabetes mellitus with ketoacidosis without coma: Secondary | ICD-10-CM

## 2016-10-13 DIAGNOSIS — E042 Nontoxic multinodular goiter: Secondary | ICD-10-CM | POA: Diagnosis not present

## 2016-10-13 DIAGNOSIS — E1110 Type 2 diabetes mellitus with ketoacidosis without coma: Secondary | ICD-10-CM

## 2016-10-13 MED ORDER — EMPAGLIFLOZIN 25 MG PO TABS: 25.0000 mg | ORAL_TABLET | Freq: Every day | ORAL | 2 refills | Status: DC

## 2016-10-13 NOTE — Patient Instructions (Addendum)
Please continue: - Metformin 1000 mg 2x a day, with meals - Glipizide 10 mg 2x a day before b'fast and before dinner  Please add: - Jardiance 25 mg before b'fast  Let me know about the sugars in 3 weeks.  Please come back for a follow-up appointment in 1.5  Months.  You will be called with the [facility] schedule.  PATIENT INSTRUCTIONS FOR TYPE 2 DIABETES:  **Please join MyChart!** - see attached instructions about how to join if you have not done so already.  DIET AND EXERCISE Diet and exercise is an important part of diabetic treatment.  We recommended aerobic exercise in the form of brisk walking (working between 40-60% of maximal aerobic capacity, similar to brisk walking) for 150 minutes per week (such as 30 minutes five days per week) along with 3 times per week performing 'resistance' training (using various gauge rubber tubes with handles) 5-10 exercises involving the major muscle groups (upper body, lower body and core) performing 10-15 repetitions (or near fatigue) each exercise. Start at half the above goal but build slowly to reach the above goals. If limited by weight, joint pain, or disability, we recommend daily walking in a swimming pool with water up to waist to reduce pressure from joints while allow for adequate exercise.    BLOOD GLUCOSES Monitoring your blood glucoses is important for continued management of your diabetes. Please check your blood glucoses 2-4 times a day: fasting, before meals and at bedtime (you can rotate these measurements - e.g. one day check before the 3 meals, the next day check before 2 of the meals and before bedtime, etc.).   HYPOGLYCEMIA (low blood sugar) Hypoglycemia is usually a reaction to not eating, exercising, or taking too much insulin/ other diabetes drugs.  Symptoms include tremors, sweating, hunger, confusion, headache, etc. Treat IMMEDIATELY with 15 grams of Carbs: . 4 glucose tablets .  cup regular juice/soda . 2 tablespoons  raisins . 4 teaspoons sugar . 1 tablespoon honey Recheck blood glucose in 15 mins and repeat above if still symptomatic/blood glucose <100.  RECOMMENDATIONS TO REDUCE YOUR RISK OF DIABETIC COMPLICATIONS: * Take your prescribed MEDICATION(S) * Follow a DIABETIC diet: Complex carbs, fiber rich foods, (monounsaturated and polyunsaturated) fats * AVOID saturated/trans fats, high fat foods, >2,300 mg salt per day. * EXERCISE at least 5 times a week for 30 minutes or preferably daily.  * DO NOT SMOKE OR DRINK more than 1 drink a day. * Check your FEET every day. Do not wear tightfitting shoes. Contact us if you develop an ulcer * See your EYE doctor once a year or more if needed * Get a FLU shot once a year * Get a PNEUMONIA vaccine once before and once after age 38 years  GOALS:  * Your Hemoglobin A1c of <7%  * fasting sugars need to be <130 * after meals sugars need to be <180 (2h after you start eating) * Your Systolic BP should be 749 or lower  * Your Diastolic BP should be 80 or lower  * Your HDL (Good Cholesterol) should be 40 or higher  * Your LDL (Bad Cholesterol) should be 100 or lower. * Your Triglycerides should be 150 or lower  * Your Urine microalbumin (kidney function) should be <30 * Your Body Mass Index should be 25 or lower    Please consider the following ways to cut down carbs and fat and increase fiber and micronutrients in your diet: - substitute whole grain for white bread  or pasta - substitute brown rice for white rice - substitute 90-calorie flat bread pieces for slices of bread when possible - substitute sweet potatoes or yams for white potatoes - substitute humus for margarine - substitute tofu for cheese when possible - substitute almond or rice milk for regular milk (would not drink soy milk daily due to concern for soy estrogen influence on breast cancer risk) - substitute dark chocolate for other sweets when possible - substitute water - can add lemon or  orange slices for taste - for diet sodas (artificial sweeteners will trick your body that you can eat sweets without getting calories and will lead you to overeating and weight gain in the long run) - do not skip breakfast or other meals (this will slow down the metabolism and will result in more weight gain over time)  - can try smoothies made from fruit and almond/rice milk in am instead of regular breakfast - can also try old-fashioned (not instant) oatmeal made with almond/rice milk in am - order the dressing on the side when eating salad at a restaurant (pour less than half of the dressing on the salad) - eat as little meat as possible - can try juicing, but should not forget that juicing will get rid of the fiber, so would alternate with eating raw veg./fruits or drinking smoothies - use as little oil as possible, even when using olive oil - can dress a salad with a mix of balsamic vinegar and lemon juice, for e.g. - use agave nectar, stevia sugar, or regular sugar rather than artificial sweateners - steam or broil/roast veggies  - snack on veggies/fruit/nuts (unsalted, preferably) when possible, rather than processed foods - reduce or eliminate aspartame in diet (it is in diet sodas, chewing gum, etc) Read the labels!  Try to read Dr. Janene Harvey book: "Program for Reversing Diabetes" for other ideas for healthy eating.

## 2016-10-13 NOTE — Progress Notes (Addendum)
Patient ID: Amal Saiki, male   DOB: February 04, 1951, 66 y.o.   MRN: [id number]   HPI: De Jaworski is a 66 y.o.-year-old male, referred by his PCP, Dr. Sherren Mocha, for management of DM2, dx in 2013 non-insulin-dependent, uncontrolled, with complications (h/o DKA).  Last hemoglobin A1c was: 08/22/2016: 7.7% Lab Results  Component Value Date   HGBA1C 7.6 (H) 02/15/2016   HGBA1C 7.2 (H) 11/09/2015   HGBA1C 7.4 (H) 08/10/2015   Pt is on a regimen of: - Metformin 1000 mg 2x a day, with meals - Glipizide 10 mg 2x a day before b'fast and after dinner - started 2017  He was off and on insulin in the past >> came off when lost weight (45 lbs). He had lap band surgery in 2014.  Pt checks his sugars 0-2x a day and they are: - am: 186-238 - 2h after b'fast: 178 - before lunch: n/c - 2h after lunch: 150 - before dinner: n/c - 2h after dinner: 225-345 - bedtime: 221 - nighttime: n/c No lows. Lowest sugar was 150; he has hypoglycemia awareness at 70.  Highest sugar was 345.  Glucometer: ?  Pt's meals are: - Breakfast: Protein shake - Lunch: Eggs, bacon or leftovers - Dinner: Meat + veggies + starch, pizza, [place] food - Snacks: In the evening: cookies, etc.  He is traveling a lot for his job, however, he plans to travel less in the future.  - no CKD, last BUN/creatinine:  Lab Results  Component Value Date   BUN 13 02/15/2016   BUN 11 11/09/2015   CREATININE 0.85 02/15/2016   CREATININE 0.66 11/09/2015  On Losartan. - last set of lipids: Lab Results  Component Value Date   CHOL 123 11/09/2015   HDL 38.40 (L) 11/09/2015   LDLCALC 57 11/09/2015   LDLDIRECT 113.0 08/10/2015   TRIG 138.0 11/09/2015   CHOLHDL 3 11/09/2015  On Lipitor. On CoQ10, Krill oil. - last eye exam was in 09/2015. No DR.  - no numbness and tingling in his feet.  Pt has FH of DM in son, mother, MGM.  He has Sleep related hypoxia, thyroid nodules - s/p 2 benign Bx'es in 2014.LatestTSH was  normal: Lab Results  Component Value Date   TSH 1.20 08/10/2015   He also has a history of HTN, HL.  ROS: Constitutional: + Weight gain, + fatigue, no subjective hyperthermia/hypothermia, + nocturia Eyes: no blurry vision, no xerophthalmia ENT: no sore throat, + nodules palpated in neck, no dysphagia/odynophagia, no hoarseness Cardiovascular: no CP/palpitations/leg swelling Respiratory: no cough/+ SOB Gastrointestinal: no N/V/D/C Musculoskeletal: no muscle/+ joint aches Skin: no rashes, + hair loss Neurological: + tremors (essential)/no numbness/tingling/dizziness, + headache Psychiatric: no depression/anxiety  Past Medical History:  Diagnosis Date  . ABNORMAL GLUCOSE NEC 03/18/2007  . Arthritis    fingers  . BLEPHARITIS, LEFT 10/29/2009  . DIABETES-TYPE 2 04/18/2010  . Dry throat   . Environmental allergies   . GERD (gastroesophageal reflux disease)   . H/O hiatal hernia   . HYPERLIPIDEMIA 10/22/2007  . HYPERTENSION 03/18/2007  . Multiple thyroid nodules    "biopsy done, everything was fine" being monitored  . NEVUS, MELANOCYTIC, FACE 10/07/2009   Past Surgical History:  Procedure Laterality Date  . BIOPSY THYROID  ~08/2013  . BREATH TEK H PYLORI N/A 09/05/2013   Procedure: BREATH TEK H PYLORI;  Surgeon: [name], MD;  Location: [facility];  Service: General;  Laterality: N/A;  . COLONOSCOPY  2009, 2013   Dr. Sharlett Iles; multiple polyps  .  HIATAL HERNIA REPAIR  11/25/2013   Procedure: LAPAROSCOPIC REPAIR OF HIATAL HERNIA;  Surgeon: [name], MD;  Location: [facility];  Service: General;;  . LAPAROSCOPIC GASTRIC BANDING N/A 11/25/2013   Procedure: LAPAROSCOPIC GASTRIC BANDING;  Surgeon: [name], MD;  Location: [facility];  Service: General;  Laterality: N/A;  . NO PAST SURGERIES     Social History   Social History  . Marital status: Married    Spouse name: N/A  . Number of children: 2   Occupational History  . 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 Smoking status: Never Smoker  . Smokeless tobacco: Never Used  . Alcohol use No  . Drug use: No   Current Outpatient Prescriptions on File Prior to Visit  Medication Sig Dispense Refill  . aspirin 81 MG tablet Take 1 tablet (81 mg total) by mouth daily. 30 tablet 5  . atorvastatin (LIPITOR) 20 MG tablet TAKE 1 TABLET EVERY DAY 90 tablet 1  . fluticasone (FLONASE) 50 MCG/ACT nasal spray USE 1 SPRAY IN EACH NOSTRIL EVERY DAY 32 g 3  . glipiZIDE (GLUCOTROL) 10 MG tablet Take 1 tablet (10 mg total) by mouth 2 (two) times daily before a meal. 200 tablet 5  . losartan (COZAAR) 25 MG tablet TAKE 1 TABLET EVERY DAY 90 tablet 1  . metFORMIN (GLUCOPHAGE) 500 MG tablet TAKE 2 TABLETs(500 MG TOTAL) BY MOUTH 2 (TWO) TIMES DAILY WITH A MEAL. 400 tablet 3  . minoxidil (ROGAINE) 2 % external solution Apply 1 application topically daily. 60 mL 10  . OXYGEN-HELIUM IN Inhale into the lungs.    . hydrochlorothiazide (HYDRODIURIL) 25 MG tablet TAKE 1 TABLET (25 MG TOTAL) BY MOUTH DAILY. (Patient not taking: Reported on 10/13/2016) 90 tablet 3   No current facility-administered medications on file prior to visit.    Allergies  Allergen Reactions  . Ace Inhibitors     REACTION: Coughing   Family History  Problem Relation Age of Onset  . Hyperlipidemia Mother   . Hypertension Mother   . Hyperlipidemia Father   . Hypertension Father   . Heart attack Father   . Hyperlipidemia Brother   . Diabetes Maternal Grandmother   . Diabetes Maternal Grandfather   . Colon cancer Neg Hx   . Stomach cancer Neg Hx   . Esophageal cancer Neg Hx   . Rectal cancer Neg Hx     PE: BP 138/82 (BP Location: Left Arm, Patient Position: Sitting)   Pulse 79   Ht 5' 10.5" (1.791 m)   Wt 234 lb (106.1 kg)   SpO2 96%   BMI 33.10 kg/m  Wt Readings from Last 3 Encounters:  10/13/16 234 lb (106.1 kg)  08/22/16 234 lb 9.6 oz (106.4 kg)  08/15/16 228 lb 9 oz (103.7 kg)   Constitutional:  overweight, in NAD Eyes: PERRLA, EOMI, no exophthalmos ENT: moist mucous membranes, no thyromegaly, + large thyroid nodule palpated in the isthmus, no cervical lymphadenopathy Cardiovascular: RRR, No MRG Respiratory: CTA B Gastrointestinal: abdomen soft, NT, ND, BS+ Musculoskeletal: no deformities, strength intact in all 4 Skin: moist, warm, no rashes Neurological: + Mild tremor with outstretched hands, DTR normal in all 4  ASSESSMENT: 1. DM2, non-insulin-dependent, uncontrolled, without long term complications - h/o DKA  2. Thyroid nodules  PLAN:  1. Patient with long-standing, uncontrolled diabetes, on oral antidiabetic regimen, which became insufficient.I am actually surprised that his HbA1c was only 7.7% at last check. - Discussed  about the concept of insulin resistance and how to improve. I suggested changes in his diet. - For now, I suggested to continue metformin, also continue glipizide but move the doses before meals, and add an SGTL2  inhibitor. - we discussed about SEs of Jardiance, which are: dizziness (advised to be careful when stands from sitting position), decreased BP - usually not < normal (BP today is not low), and fungal UTIs (advised to let me know if develops one).  - At next visit, we will most likely need to add a GLP 1 receptor agonist - I suggested to:  Patient Instructions  Please continue: - Metformin 1000 mg 2x a day, with meals - Glipizide 10 mg 2x a day before b'fast and before dinner  Please add: - Jardiance 25 mg before b'fast  Let me know about the sugars in 3 weeks.  Please come back for a follow-up appointment in 1.5  Months.  You will be called with the U/S schedule.  - Strongly advised him to start checking sugars at different times of the day - check 2 times a day, rotating checks - given sugar log and advised how to fill it and to bring it at next appt  - given foot care handout and explained the principles  - given instructions for  hypoglycemia management "15-15 rule"  - advised for yearly eye exams  - Return to clinic in 1.5 mo with sugar log  2. Thyroid nodules - Patient has a history of 2 thyroid nodules that have been evaluated in the past by [facility]NA 2014, with both results benign - Denies neck compression symptoms - Since it has been 4 years from the previous ultrasound, I suggested to recheck this.  US SOFT TISSUE HEAD AND NECK  Order: [id number]  Status:  Final result Visible to patient:  No (Not Released) Dx:  Multiple thyroid nodules  Details   Reading Physician Reading Date Result Priority  Aletta Edouard, MD 10/20/2016   Narrative    CLINICAL DATA: Prior ultrasound follow-up. History of multinodular thyroid goiter with prior fine-needle aspiration of isthmus and inferior right lobe nodules on 04/29/2013 demonstrating non neoplastic goiter.  EXAM: THYROID ULTRASOUND  TECHNIQUE: Ultrasound examination of the thyroid gland and adjacent soft tissues was performed.  COMPARISON: 04/24/1999  FINDINGS: Parenchymal Echotexture: Mildly heterogenous  Isthmus: 0.8 cm  Right lobe: 5.5 x 2.4 x 1.7 cm  Left lobe: 4.9 x 2.3 x 2.0 cm  _________________________________________________________  Estimated total number of nodules >/= 1 cm: 1  Number of spongiform nodules >/= 2 cm not described below (TR1): 0  Number of mixed cystic and solid nodules >/= 1.5 cm not described below ([place]): 0  _________________________________________________________  Nodule # 1:  Prior biopsy: Yes  Location: Right; Inferior  Maximum size: 2.8 cm; Other 2 dimensions: 2.1 x 2.6 cm, previously, 2.7 cm  Composition: solid/almost completely solid (2)  Echogenicity: isoechoic (1)  Shape: not taller-than-wide (0)  Margins: ill-defined (0)  Echogenic foci: none (0)  ACR TI-RADS total points: 3.  ACR TI-RADS risk category: TR3 (3 points).  Significant change in size (>/= 20% in two dimensions and  minimal increase of 2 mm): No  Change in features: No  Change in ACR TI-RADS risk category: No  ACR TI-RADS recommendations:  Given stable size and prior negative biopsy, this nodule can be followed by ultrasound.  _________________________________________________________  Previously measured 1.8 cm nodule within the right isthmus is no longer seen.   Small 0.6 cm cyst in  the inferior right lobe appears stable and benign.  IMPRESSION: Stable 2.8 cm solid nodule in the inferior right lobe. This was previously sampled with negative biopsy. The 1.8 cm nodule previously sampled in the right isthmus is no longer identifiable by ultrasound.  The above is in keeping with the ACR TI-RADS recommendations - J Am Coll Radiol 2017;14:587-595.   Electronically Signed By: Aletta Edouard M.D. On: 10/20/2016 07:56       No intervention needed for now  Philemon Kingdom, MD PhD [facility] Endocr[facility]

## 2016-10-19 ENCOUNTER — Ambulatory Visit
Admission: RE | Admit: 2016-10-19 | Discharge: 2016-10-19 | Disposition: A | Discharge status: Home / Self Care | Payer: Medicare Other | Source: Ambulatory Visit | Attending: Internal Medicine | Admitting: Internal Medicine

## 2016-10-19 DIAGNOSIS — E042 Nontoxic multinodular goiter: Secondary | ICD-10-CM | POA: Diagnosis not present

## 2016-10-20 ENCOUNTER — Encounter: Payer: Self-pay | Admitting: Internal Medicine

## 2016-11-16 ENCOUNTER — Other Ambulatory Visit: Payer: Self-pay

## 2016-11-16 MED ORDER — EMPAGLIFLOZIN 25 MG PO TABS: 25.0000 mg | ORAL_TABLET | Freq: Every day | ORAL | 1 refills | Status: DC

## 2016-11-24 DIAGNOSIS — E119 Type 2 diabetes mellitus without complications: Secondary | ICD-10-CM | POA: Diagnosis not present

## 2016-11-24 LAB — HM DIABETES EYE EXAM

## 2016-11-30 ENCOUNTER — Encounter: Payer: Self-pay | Admitting: Internal Medicine

## 2016-11-30 ENCOUNTER — Ambulatory Visit (INDEPENDENT_AMBULATORY_CARE_PROVIDER_SITE_OTHER): Payer: Medicare Other | Admitting: Internal Medicine

## 2016-11-30 VITALS — BP 132/80 | HR 66 | Ht 71.0 in | Wt 229.0 lb

## 2016-11-30 DIAGNOSIS — E1110 Type 2 diabetes mellitus with ketoacidosis without coma: Secondary | ICD-10-CM

## 2016-11-30 DIAGNOSIS — E042 Nontoxic multinodular goiter: Secondary | ICD-10-CM | POA: Diagnosis not present

## 2016-11-30 LAB — COMPLETE METABOLIC PANEL WITH GFR
AG Ratio: 1.8 Ratio (ref 1.0–2.5)
ALBUMIN: 4.5 g/dL (ref 3.6–5.1)
ALT: 19 U/L (ref 9–46)
AST: 15 U/L (ref 10–35)
Alkaline Phosphatase: 51 U/L (ref 40–115)
BUN/Creatinine Ratio: 25.3 Ratio — ABNORMAL HIGH (ref 6–22)
BUN: 20 mg/dL (ref 7–25)
CALCIUM: 10.1 mg/dL (ref 8.6–10.3)
CHLORIDE: 105 mmol/L (ref 98–110)
CO2: 21 mmol/L (ref 20–31)
CREATININE: 0.79 mg/dL (ref 0.70–1.25)
GFR, Est Non African American: >89 mL/min (ref >=60)
Globulin: 2.5 g/dL (ref 1.9–3.7)
Glucose, Bld: 108 mg/dL — ABNORMAL HIGH (ref 65–99)
Potassium: 4.5 mmol/L (ref 3.5–5.3)
Sodium: 139 mmol/L (ref 135–146)
Total Bilirubin: 0.4 mg/dL (ref 0.2–1.2)
Total Protein: 7 g/dL (ref 6.1–8.1)

## 2016-11-30 LAB — MICROALBUMIN / CREATININE URINE RATIO
Creatinine,U: 110 mg/dL
MICROALB UR: 0.7 mg/dL (ref 0.0–1.9)
Microalb Creat Ratio: 0.6 mg/g (ref 0.0–30.0)

## 2016-11-30 LAB — LIPID PANEL
CHOL/HDL RATIO: 4
Cholesterol: 126 mg/dL (ref 0–200)
HDL: 35.6 mg/dL — ABNORMAL LOW (ref >39.00)
NONHDL: 90.26
TRIGLYCERIDES: 213 mg/dL — AB (ref 0.0–149.0)
VLDL: 42.6 mg/dL — ABNORMAL HIGH (ref 0.0–40.0)

## 2016-11-30 LAB — LDL CHOLESTEROL, DIRECT: Direct LDL: 66 mg/dL

## 2016-11-30 LAB — POCT GLYCOSYLATED HEMOGLOBIN (HGB A1C): HEMOGLOBIN A1C: 6.9

## 2016-11-30 NOTE — Progress Notes (Signed)
Patient ID: Robert Cordova, male   DOB: 1951/04/07, 66 y.o.   MRN: [id number]   HPI: Robert Cordova is a 65 y.o.-year-old male, returning for f/u for DM2, dx in 2013, non-insulin-dependent, uncontrolled, with complications (h/o DKA). Last visit 1.5 mo ago.  Last hemoglobin A1c was: 08/22/2016: 7.7% Lab Results  Component Value Date   HGBA1C 7.6 (H) 02/15/2016   HGBA1C 7.2 (H) 11/09/2015   HGBA1C 7.4 (H) 08/10/2015   Pt was on a regimen of: - Metformin 1000 mg 2x a day, with meals - Glipizide 10 mg 2x a day before b'fast and after dinner - started 2017  At last visit, we changed to the following regimen: - Metformin 1000 mg 2x a day, with meals - Glipizide 10 mg 2x a day before b'fast and before dinner - Jardiance 25 mg before b'fast  Of note, he was off and on insulin in the past >> came off when lost weight (45 lbs). He had lap band surgery in 2014.  He is not sugars 1-2 times a day, except when he was out of town, when he did not check CBGs >> they are improved: - am: 186-238 >> 120-161, 175 - 2h after b'fast: 178 >> 168 - before lunch: n/c - 2h after lunch: 150 - before dinner: n/c >> 97-114 - 2h after dinner: 225-345 >> 93-198 - bedtime: 221 >> n/c - nighttime: n/c No lows. Lowest sugar was 150 >> 97; he has hypoglycemia awareness at 70.  Highest sugar was 345 >> 198.  Pt's meals are: - Breakfast: Protein shake - Lunch: Eggs, bacon or leftovers - Dinner: Meat + veggies + starch, pizza, [place] food - Snacks: In the evening: cookies, etc.  He is still traveling a lot for his job, but plans to cut down.  - No CKD, last BUN/creatinine:  Lab Results  Component Value Date   BUN 13 02/15/2016   BUN 11 11/09/2015   CREATININE 0.85 02/15/2016   CREATININE 0.66 11/09/2015  On Losartan. - last set of lipids: Lab Results  Component Value Date   CHOL 123 11/09/2015   HDL 38.40 (L) 11/09/2015   LDLCALC 57 11/09/2015   LDLDIRECT 113.0 08/10/2015   TRIG 138.0  11/09/2015   CHOLHDL 3 11/09/2015  He continues on Lipitor, CoQ10 and krill oil. - last eye exam was on 11/24/2016. No DR.  - no numbness and tingling in his feet.  He has thyroid nodules - s/p 2 benign Bx'es in 2014. LatestTSH was normal: Lab Results  Component Value Date   TSH 1.20 08/10/2015   A thyroid U/S (10/20/2016) showed: Stable 2.8 cm solid nodule in the inferior right lobe. This was previously sampled with negative biopsy. The 1.8 cm nodule previously sampled in the right isthmus is no longer identifiable by ultrasound.  Also has sleep-related hypoxia, HTN, HL.  ROS: Constitutional: no weight gain/no weight loss, no fatigue, no subjective hyperthermia, no subjective hypothermia Eyes: no blurry vision, no xerophthalmia ENT: no sore throat, no nodules palpated in throat, no dysphagia, no odynophagia, no hoarseness Cardiovascular: no CP/no SOB/no palpitations/no leg swelling Respiratory: no cough/no SOB Gastrointestinal: no N/no V/no D/no C Musculoskeletal: no muscle aches/no joint aches Skin: no rashes Neurological: no tremors/no numbness/no tingling/no dizziness  Past Medical History:  Diagnosis Date  . ABNORMAL GLUCOSE NEC 03/18/2007  . Arthritis    fingers  . BLEPHARITIS, LEFT 10/29/2009  . DIABETES-TYPE 2 04/18/2010  . Dry throat   . Environmental allergies   . GERD (gastroesophageal reflux  disease)   . H/O hiatal hernia   . HYPERLIPIDEMIA 10/22/2007  . HYPERTENSION 03/18/2007  . Multiple thyroid nodules    "biopsy done, everything was fine" being monitored  . NEVUS, MELANOCYTIC, FACE 10/07/2009   Past Surgical History:  Procedure Laterality Date  . BIOPSY THYROID  ~08/2013  . BREATH TEK H PYLORI N/A 09/05/2013   Procedure: BREATH TEK H PYLORI;  Surgeon: [name], MD;  Location: [facility];  Service: General;  Laterality: N/A;  . COLONOSCOPY  2009, 2013   Dr. Sharlett Iles; multiple polyps  . HIATAL HERNIA REPAIR  11/25/2013   Procedure: LAPAROSCOPIC  REPAIR OF HIATAL HERNIA;  Surgeon: [name], MD;  Location: [facility];  Service: General;;  . LAPAROSCOPIC GASTRIC BANDING N/A 11/25/2013   Procedure: LAPAROSCOPIC GASTRIC BANDING;  Surgeon: [name], MD;  Location: [facility];  Service: General;  Laterality: N/A;  . NO PAST SURGERIES     Social History   Social History  . Marital status: Married    Spouse name: N/A  . Number of children: 2   Occupational History  . 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 Smoking status: Never Smoker  . Smokeless tobacco: Never Used  . Alcohol use No  . Drug use: No   Current Outpatient Prescriptions on File Prior to Visit  Medication Sig Dispense Refill  . aspirin 81 MG tablet Take 1 tablet (81 mg total) by mouth daily. 30 tablet 5  . atorvastatin (LIPITOR) 20 MG tablet TAKE 1 TABLET EVERY DAY 90 tablet 1  . Cholecalciferol (VITAMIN D PO) Take 50 mcg by mouth daily.    . Coenzyme Q10 (CO Q-10) 200 MG CAPS Take by mouth daily.    . empagliflozin (JARDIANCE) 25 MG TABS tablet Take 25 mg by mouth daily. 90 tablet 1  . fluticasone (FLONASE) 50 MCG/ACT nasal spray USE 1 SPRAY IN EACH NOSTRIL EVERY DAY 32 g 3  . glipiZIDE (GLUCOTROL) 10 MG tablet Take 1 tablet (10 mg total) by mouth 2 (two) times daily before a meal. 200 tablet 5  . hydrochlorothiazide (HYDRODIURIL) 25 MG tablet TAKE 1 TABLET (25 MG TOTAL) BY MOUTH DAILY. 90 tablet 3  . losartan (COZAAR) 25 MG tablet TAKE 1 TABLET EVERY DAY 90 tablet 1  . Magnesium 500 MG TABS Take by mouth daily.    . metFORMIN (GLUCOPHAGE) 500 MG tablet TAKE 2 TABLETs(500 MG TOTAL) BY MOUTH 2 (TWO) TIMES DAILY WITH A MEAL. 400 tablet 3  . minoxidil (ROGAINE) 2 % external solution Apply 1 application topically daily. 60 mL 10  . Omega-3 Fatty Acids (OMEGA 3 PO) Take 750 mg by mouth daily.    Donell Sievert IN Inhale into the lungs.     No current facility-administered medications on file prior to visit.    Allergies   Allergen Reactions  . Ace Inhibitors     REACTION: Coughing   Family History  Problem Relation Age of Onset  . Hyperlipidemia Mother   . Hypertension Mother   . Hyperlipidemia Father   . Hypertension Father   . Heart attack Father   . Hyperlipidemia Brother   . Diabetes Maternal Grandmother   . Diabetes Maternal Grandfather   . Colon cancer Neg Hx   . Stomach cancer Neg Hx   . Esophageal cancer Neg Hx   . Rectal cancer Neg Hx     PE: BP 132/80 (BP Location: Left Arm, Patient Position: Sitting)   Pulse 66  Ht 5\' 11"  (1.803 m)   Wt 229 lb (103.9 kg)   SpO2 98%   BMI 31.94 kg/m  Wt Readings from Last 3 Encounters:  11/30/16 229 lb (103.9 kg)  10/13/16 234 lb (106.1 kg)  08/22/16 234 lb 9.6 oz (106.4 kg)   Constitutional: overweight, in NAD Eyes: PERRLA, EOMI, no exophthalmos ENT: moist mucous membranes, + large thyroid nodule palpated in the isthmus, no cervical lymphadenopathy Cardiovascular: RRR, No MRG Respiratory: CTA B Gastrointestinal: abdomen soft, NT, ND, BS+ Musculoskeletal: no deformities, strength intact in all 4 Skin: moist, warm, no rashes Neurological: no tremor with outstretched hands, DTR normal in all 4  ASSESSMENT: 1. DM2, non-insulin-dependent, uncontrolled, without long term complications - h/o DKA  2. Thyroid nodules  PLAN:  1. Patient with long-standing, uncontrolled diabetes, on oral antidiabetic regimen, To which we had the Jardiance at last visit. He initially had problems with high cost, however, he is now paying less money. I gave him a discount card at this visit, but I am not sure if he can use it with his insurance. He has Medicare with 2 supplemental insurances. - At this visit, his sugars are much better and he also lost weight (5 pounds in the last 1.5 months) - Since sugars improved significantly, I would not make any changes in his regimen right now, however, at next visit, we will most likely need to add a GLP 1 receptor  agonist - HbA1c is better: 6.9%! - I suggested to:  Patient Instructions  Please continue: - Metformin 1000 mg 2x a day, with meals - Glipizide 10 mg 2x a day before b'fast and before dinner - Jardiance 25 mg before b'fast  Please stop at the lab.  Please come back for a follow-up appointment in 3 months.  - continuechecking sugars at different times of the day - check 2 times a day, rotating checks - Today we will check a CMP, cholesterol level, and an ACR. - Return in about 3 months (around 03/01/2017).  2. Thyroid nodules - He has a history of 2 thyroid nodules, that have been evaluated in the past by [facility]NA (2014), with benign results - We repeated the thyroid ultrasound at last visit, and this showed stable nodules >> we again discussed that for this, no intervention is needed - he denies neck compression symptoms  Component     Latest Ref Rng & Units 11/30/2016  Sodium     135 - 146 mmol/L 139  Potassium     3.5 - 5.3 mmol/L 4.5  Chloride     98 - 110 mmol/L 105  CO2     20 - 31 mmol/L 21  Glucose     65 - 99 mg/dL 108 (H)  BUN     7 - 25 mg/dL 20  Creatinine     0.70 - 1.25 mg/dL 0.79  Total Bilirubin     0.2 - 1.2 mg/dL 0.4  Alkaline Phosphatase     40 - 115 U/L 51  AST     10 - 35 U/L 15  ALT     9 - 46 U/L 19  Total Protein     6.1 - 8.1 g/dL 7.0  Albumin     3.6 - 5.1 g/dL 4.5  Calcium     8.6 - 10.3 mg/dL 10.1  Globulin     1.9 - 3.7 g/dL 2.5  AG Ratio     1.0 - 2.5 Ratio 1.8  BUN/Creatinine Ratio  6 - 22 Ratio 25.3 (H)  GFR, Est African American     >=60 mL/min >89  GFR, Est Non African American     >=60 mL/min >89  Cholesterol     0 - 200 mg/dL 126  Triglycerides     0.0 - 149.0 mg/dL 213.0 (H)  HDL Cholesterol     >39.00 mg/dL 35.60 (L)  VLDL     0.0 - 40.0 mg/dL 42.6 (H)  Total CHOL/HDL Ratio      4  NonHDL      90.26  Microalb, Ur     0.0 - 1.9 mg/dL 0.7  Creatinine,U     mg/dL 110.0  MICROALB/CREAT RATIO     0.0 - 30.0  mg/g 0.6  Hemoglobin A1C      6.9  Direct LDL     mg/dL 66.0    Philemon Kingdom, MD PhD [facility] Endocrinology

## 2016-11-30 NOTE — Patient Instructions (Addendum)
Please continue: - Metformin 1000 mg 2x a day, with meals - Glipizide 10 mg 2x a day before b'fast and before dinner - Jardiance 25 mg before b'fast  Please stop at the lab.  Please come back for a follow-up appointment in 3 months.

## 2017-01-08 ENCOUNTER — Telehealth: Payer: Self-pay | Admitting: Family Medicine

## 2017-01-08 ENCOUNTER — Other Ambulatory Visit: Payer: Self-pay | Admitting: Family Medicine

## 2017-01-08 NOTE — Telephone Encounter (Signed)
Pt now seeing Dr. Cruzita Lederer for diabetes management.  Will forward for refill authorization.

## 2017-01-08 NOTE — Telephone Encounter (Signed)
Pt needs CPX with Dr. Sherren Mocha when he returns.  Please help pt to make an appt.  He should come fasting for lab work.  Thanks!!

## 2017-01-09 NOTE — Telephone Encounter (Signed)
Pt has been sch

## 2017-01-15 ENCOUNTER — Other Ambulatory Visit: Payer: Self-pay

## 2017-01-15 MED ORDER — EMPAGLIFLOZIN 25 MG PO TABS: 25.0000 mg | ORAL_TABLET | Freq: Every day | ORAL | 1 refills | Status: DC

## 2017-01-16 ENCOUNTER — Other Ambulatory Visit: Payer: Self-pay

## 2017-01-16 MED ORDER — EMPAGLIFLOZIN 25 MG PO TABS: 25.0000 mg | ORAL_TABLET | Freq: Every day | ORAL | 1 refills | Status: DC

## 2017-01-17 ENCOUNTER — Other Ambulatory Visit: Payer: Self-pay

## 2017-01-17 ENCOUNTER — Telehealth: Payer: Self-pay | Admitting: Internal Medicine

## 2017-01-17 MED ORDER — METFORMIN HCL 500 MG PO TABS: ORAL_TABLET | ORAL | 3 refills | Status: DC

## 2017-01-17 MED ORDER — EMPAGLIFLOZIN 25 MG PO TABS
25.0000 mg | ORAL_TABLET | Freq: Every day | ORAL | 1 refills | Status: DC
Start: 2017-01-17 — End: 2017-07-18

## 2017-01-17 NOTE — Telephone Encounter (Signed)
Submitted

## 2017-01-17 NOTE — Telephone Encounter (Signed)
**  Remind patient they can make refill requests via MyChart**  Medication refill request (Name & Dosage): metFORMIN (GLUCOPHAGE) 500 MG tablet [[id number]]  empagliflozin (JARDIANCE) 25 MG TABS tablet [[id number]]   Preferred pharmacy (Name & Address):  [facility], [place] [phone] (Phone) [phone] (Fax)      Other comments (if applicable):

## 2017-03-15 ENCOUNTER — Encounter: Payer: Self-pay | Admitting: Adult Health

## 2017-03-15 ENCOUNTER — Ambulatory Visit (INDEPENDENT_AMBULATORY_CARE_PROVIDER_SITE_OTHER): Payer: Medicare Other | Admitting: Adult Health

## 2017-03-15 VITALS — BP 152/74 | Temp 98.2°F | Wt 224.0 lb

## 2017-03-15 DIAGNOSIS — J301 Allergic rhinitis due to pollen: Secondary | ICD-10-CM | POA: Diagnosis not present

## 2017-03-15 MED ORDER — FLUTICASONE PROPIONATE 50 MCG/ACT NA SUSP: 2.0000 | Freq: Every day | NASAL | 2 refills | Status: DC

## 2017-03-15 NOTE — Progress Notes (Signed)
Subjective:    Patient ID: [name], male    DOB: Feb 16, 1951, 66 y.o.   MRN: [id number]  HPI  66 year old male who  has a past medical history of ABNORMAL GLUCOSE NEC (03/18/2007); Arthritis; BLEPHARITIS, LEFT (10/29/2009); DIABETES-TYPE 2 (04/18/2010); Dry throat; Environmental allergies; GERD (gastroesophageal reflux disease); H/O hiatal hernia; HYPERLIPIDEMIA (10/22/2007); HYPERTENSION (03/18/2007); Multiple thyroid nodules; and NEVUS, MELANOCYTIC, FACE (10/07/2009).   He presents to the office today for 11 days of post nasal drip and semi productive cough. He denies any sinus pain/pressure, fevers, or feeling ill.   He denies any itchy watery eyes  He has been using theraflu which he reports " helps dry me up"    Review of Systems See HPI   Past Medical History:  Diagnosis Date  . ABNORMAL GLUCOSE NEC 03/18/2007  . Arthritis    fingers  . BLEPHARITIS, LEFT 10/29/2009  . DIABETES-TYPE 2 04/18/2010  . Dry throat   . Environmental allergies   . GERD (gastroesophageal reflux disease)   . H/O hiatal hernia   . HYPERLIPIDEMIA 10/22/2007  . HYPERTENSION 03/18/2007  . Multiple thyroid nodules    "biopsy done, everything was fine" being monitored  . NEVUS, MELANOCYTIC, FACE 10/07/2009    Social History   Social History  . Marital status: Married    Spouse name: N/A  . Number of children: N/A  . Years of education: N/A   Occupational History  . Consultant-- self-employeed    Social History Main Topics  . Smoking status: Never Smoker  . Smokeless tobacco: Never Used  . Alcohol use No  . Drug use: No  . Sexual activity: Not on file   Other Topics Concern  . Not on file   Social History Narrative  . No narrative on file    Past Surgical History:  Procedure Laterality Date  . BIOPSY THYROID  ~08/2013  . BREATH TEK H PYLORI N/A 09/05/2013   Procedure: BREATH TEK H PYLORI;  Surgeon: [name], MD;  Location: [facility];  Service: General;  Laterality: N/A;    . COLONOSCOPY  2009, 2013   Dr. Sharlett Iles; multiple polyps  . HIATAL HERNIA REPAIR  11/25/2013   Procedure: LAPAROSCOPIC REPAIR OF HIATAL HERNIA;  Surgeon: [name], MD;  Location: [facility];  Service: General;;  . LAPAROSCOPIC GASTRIC BANDING N/A 11/25/2013   Procedure: LAPAROSCOPIC GASTRIC BANDING;  Surgeon: [name], MD;  Location: [facility];  Service: General;  Laterality: N/A;  . NO PAST SURGERIES      Family History  Problem Relation Age of Onset  . Hyperlipidemia Mother   . Hypertension Mother   . Hyperlipidemia Father   . Hypertension Father   . Heart attack Father   . Hyperlipidemia Brother   . Diabetes Maternal Grandmother   . Diabetes Maternal Grandfather   . Colon cancer Neg Hx   . Stomach cancer Neg Hx   . Esophageal cancer Neg Hx   . Rectal cancer Neg Hx     Allergies  Allergen Reactions  . Ace Inhibitors     REACTION: Coughing    Current Outpatient Prescriptions on File Prior to Visit  Medication Sig Dispense Refill  . aspirin 81 MG tablet Take 1 tablet (81 mg total) by mouth daily. 30 tablet 5  . atorvastatin (LIPITOR) 20 MG tablet TAKE 1 TABLET EVERY DAY 90 tablet 0  . Cholecalciferol (VITAMIN D PO) Take 50 mcg by mouth daily.    . Coenzyme Q10 (CO  Q-10) 200 MG CAPS Take by mouth daily.    . empagliflozin (JARDIANCE) 25 MG TABS tablet Take 25 mg by mouth daily. 90 tablet 1  . glipiZIDE (GLUCOTROL) 10 MG tablet Take 1 tablet (10 mg total) by mouth 2 (two) times daily before a meal. 200 tablet 5  . losartan (COZAAR) 25 MG tablet TAKE 1 TABLET EVERY DAY (Patient taking differently: TAKE 1 TABLET TWICE DAILY) 90 tablet 0  . Magnesium 500 MG TABS Take by mouth daily.    . metFORMIN (GLUCOPHAGE) 500 MG tablet TAKE 2 TABLETs(500 MG TOTAL) BY MOUTH 2 (TWO) TIMES DAILY WITH A MEAL. 400 tablet 3  . minoxidil (ROGAINE) 2 % external solution Apply 1 application topically daily. 60 mL 10  . Omega-3 Fatty Acids (OMEGA 3 PO) Take 750 mg by mouth daily.    Donell Sievert IN Inhale into the lungs.    . hydrochlorothiazide (HYDRODIURIL) 25 MG tablet TAKE 1 TABLET (25 MG TOTAL) BY MOUTH DAILY. (Patient not taking: Reported on 03/15/2017) 90 tablet 3   No current facility-administered medications on file prior to visit.     BP (!) 152/74 (BP Location: Left Arm)   Temp 98.2 F (36.8 C) (Oral)   Wt 224 lb (101.6 kg)   BMI 31.24 kg/m       Objective:   Physical Exam  Constitutional: He is oriented to person, place, and time. He appears well-developed and well-nourished. No distress.  HENT:  Right Ear: Hearing, tympanic membrane, external ear and ear canal normal.  Left Ear: Hearing, tympanic membrane, external ear and ear canal normal.  Nose: Rhinorrhea present.  + PND   Cardiovascular: Normal rate, regular rhythm, normal heart sounds and intact distal pulses.  Exam reveals no gallop and no friction rub.   No murmur heard. Pulmonary/Chest: Effort normal and breath sounds normal. No respiratory distress. He has no wheezes. He has no rales. He exhibits no tenderness.  Neurological: He is alert and oriented to person, place, and time.  Skin: Skin is warm and dry. No rash noted. He is not diaphoretic. No erythema. No pallor.  Psychiatric: He has a normal mood and affect. His behavior is normal. Judgment and thought content normal.  Nursing note and vitals reviewed.     Assessment & Plan:  1. Seasonal allergic rhinitis due to pollen - exams and symptoms consistent with seasonal allergies - fluticasone (FLONASE) 50 MCG/ACT nasal spray; Place 2 sprays into both nostrils daily.  Dispense: 1 g; Refill: 2 - Follow up if no improvement in the next week or sooner if fever presents   Dorothyann Peng, NP

## 2017-03-19 ENCOUNTER — Ambulatory Visit (INDEPENDENT_AMBULATORY_CARE_PROVIDER_SITE_OTHER): Payer: Medicare Other | Admitting: Internal Medicine

## 2017-03-19 ENCOUNTER — Encounter: Payer: Self-pay | Admitting: Internal Medicine

## 2017-03-19 VITALS — BP 134/82 | HR 66 | Wt 222.0 lb

## 2017-03-19 DIAGNOSIS — E1110 Type 2 diabetes mellitus with ketoacidosis without coma: Secondary | ICD-10-CM

## 2017-03-19 DIAGNOSIS — E042 Nontoxic multinodular goiter: Secondary | ICD-10-CM

## 2017-03-19 LAB — POCT GLYCOSYLATED HEMOGLOBIN (HGB A1C): HEMOGLOBIN A1C: 6.3

## 2017-03-19 MED ORDER — GLIPIZIDE 5 MG PO TABS: 5.0000 mg | ORAL_TABLET | Freq: Two times a day (BID) | ORAL | 3 refills | Status: DC

## 2017-03-19 NOTE — Patient Instructions (Addendum)
Please continue: - Metformin 1000 mg 2x a day, with meals - Jardiance 25 mg before b'fast  Please decrease: - Glipizide to 5 mg 2x a day before b'fast and before dinner  Please come back for a follow-up appointment in 3 months.

## 2017-03-19 NOTE — Progress Notes (Signed)
Patient ID: Robert Cordova, male   DOB: [birth year], 66 y.o.   MRN: [id number]   HPI: Robert Cordova is a 66 y.o.-year-old male, returning for f/u for DM2, dx in 2013, non-insulin-dependent, uncontrolled, with complications (h/o DKA). Last visit 3.5 mo ago.  He lost 7 lbs since last visit! Despite travelling over the summer.  Last hemoglobin A1c was: 08/22/2016: 7.7% Lab Results  Component Value Date   HGBA1C 6.9 11/30/2016   HGBA1C 7.6 (H) 02/15/2016   HGBA1C 7.2 (H) 11/09/2015   Pt was on a regimen of: - Metformin 1000 mg 2x a day, with meals - Glipizide 10 mg 2x a day before b'fast and after dinner - started 2017  He is now on: - Metformin 1000 mg 2x a day, with meals - Glipizide 10 mg 2x a day before b'fast and before dinner - Jardiance 25 mg before b'fast  Of note, He was off and on insulin in the past. He came off when he lost weight after his lab band surgery in 2014 (45 pounds)  He is checking sugars 1-2 x a day: - am: 186-238 >> 120-161, 175 >> 102-147, 168 - 2h after b'fast: 178 >> 168 >> 108 - before lunch: n/c >> 100 - 2h after lunch: 150 >> n/c - before dinner: n/c >> 97-114 >> 131 - 2h after dinner: 225-345 >> 93-198 >> n/c - bedtime: 221 >> n/c >> 86-157, 279 x1 (many sweets, drinks) - nighttime: n/c No lows. Lowest sugar was 150 >> 97 >> 86; he has hypoglycemia awareness at 70.  Highest sugar was 345 >> 198 >> 279.  Pt's meals are: - Breakfast: Protein shake - Lunch: Eggs, bacon or leftovers - Dinner: Meat + veggies + starch, pizza, [place] food - Snacks: In the evening: cookies, etc.  - No CKD, last BUN/creatinine:  Lab Results  Component Value Date   BUN 20 11/30/2016   BUN 13 02/15/2016   CREATININE 0.79 11/30/2016   CREATININE 0.85 02/15/2016  On Losartan 25 bid >> increased the dose since last visit. - last set of lipids: Lab Results  Component Value Date   CHOL 126 11/30/2016   HDL 35.60 (L) 11/30/2016   LDLCALC 57 11/09/2015   LDLDIRECT 66.0 11/30/2016   TRIG 213.0 (H) 11/30/2016   CHOLHDL 4 11/30/2016  On Lipitor, CoQ10 and krill oil. - last eye exam was on 11/2016. No DR - denies numbness and tingling in his feet.  He has thyroid nodules - s/p 2 benign Bx'es in 2014.   Latest TSH was normal: Lab Results  Component Value Date   TSH 1.20 08/10/2015   Reviewed his latest thyroid ultrasound report: A thyroid U/S (10/20/2016): Stable 2.8 cm solid nodule in the inferior right lobe. This was previously sampled with negative biopsy. The 1.8 cm nodule previously sampled in the right isthmus is no longer identifiable by ultrasound.  He also sleep-related hypoxia, HTN, HL.  He has Medicare with 2 supplemental insurances.  ROS: Constitutional: no weight gain/no weight loss, no fatigue, no subjective hyperthermia, no subjective hypothermia, Nocturia Eyes: no blurry vision, no xerophthalmia ENT: + sore throat, no nodules palpated in throat, no dysphagia, no odynophagia, no hoarseness Cardiovascular: no CP/no SOB/no palpitations/no leg swelling Respiratory: + cough/no SOB/no wheezing Gastrointestinal: no N/no V/no D/no C/no acid reflux Musculoskeletal: no muscle aches/no joint aches Skin: no rashes, no hair loss Neurological: no tremors/no numbness/no tingling/no dizziness  I reviewed pt's medications, allergies, PMH, social hx, family hx, and changes were documented in  the history of present illness. Otherwise, unchanged from my initial visit note.  Past Medical History:  Diagnosis Date  . ABNORMAL GLUCOSE NEC 03/18/2007  . Arthritis    fingers  . BLEPHARITIS, LEFT 10/29/2009  . DIABETES-TYPE 2 04/18/2010  . Dry throat   . Environmental allergies   . GERD (gastroesophageal reflux disease)   . H/O hiatal hernia   . HYPERLIPIDEMIA 10/22/2007  . HYPERTENSION 03/18/2007  . Multiple thyroid nodules    "biopsy done, everything was fine" being monitored  . NEVUS, MELANOCYTIC, FACE 10/07/2009   Past Surgical  History:  Procedure Laterality Date  . BIOPSY THYROID  ~08/2013  . BREATH TEK H PYLORI N/A 09/05/2013   Procedure: BREATH TEK H PYLORI;  Surgeon: [name], MD;  Location: [facility];  Service: General;  Laterality: N/A;  . COLONOSCOPY  2009, 2013   Dr. Sharlett Iles; multiple polyps  . HIATAL HERNIA REPAIR  11/25/2013   Procedure: LAPAROSCOPIC REPAIR OF HIATAL HERNIA;  Surgeon: [name], MD;  Location: [facility];  Service: General;;  . LAPAROSCOPIC GASTRIC BANDING N/A 11/25/2013   Procedure: LAPAROSCOPIC GASTRIC BANDING;  Surgeon: [name], MD;  Location: [facility];  Service: General;  Laterality: N/A;  . NO PAST SURGERIES     Social History   Social History  . Marital status: Married    Spouse name: N/A  . Number of children: 2   Occupational History  . 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 Smoking status: Never Smoker  . Smokeless tobacco: Never Used  . Alcohol use No  . Drug use: No   Current Outpatient Prescriptions on File Prior to Visit  Medication Sig Dispense Refill  . aspirin 81 MG tablet Take 1 tablet (81 mg total) by mouth daily. 30 tablet 5  . atorvastatin (LIPITOR) 20 MG tablet TAKE 1 TABLET EVERY DAY 90 tablet 0  . Cholecalciferol (VITAMIN D PO) Take 50 mcg by mouth daily.    . Coenzyme Q10 (CO Q-10) 200 MG CAPS Take by mouth daily.    . empagliflozin (JARDIANCE) 25 MG TABS tablet Take 25 mg by mouth daily. 90 tablet 1  . fluticasone (FLONASE) 50 MCG/ACT nasal spray Place 2 sprays into both nostrils daily. 1 g 2  . glipiZIDE (GLUCOTROL) 10 MG tablet Take 1 tablet (10 mg total) by mouth 2 (two) times daily before a meal. 200 tablet 5  . hydrochlorothiazide (HYDRODIURIL) 25 MG tablet TAKE 1 TABLET (25 MG TOTAL) BY MOUTH DAILY. 90 tablet 3  . losartan (COZAAR) 25 MG tablet TAKE 1 TABLET EVERY DAY (Patient taking differently: TAKE 2 TABLET TWICE DAILY) 90 tablet 0  . Magnesium 500 MG TABS Take by mouth daily.    .  metFORMIN (GLUCOPHAGE) 500 MG tablet TAKE 2 TABLETs(500 MG TOTAL) BY MOUTH 2 (TWO) TIMES DAILY WITH A MEAL. 400 tablet 3  . minoxidil (ROGAINE) 2 % external solution Apply 1 application topically daily. 60 mL 10  . Omega-3 Fatty Acids (OMEGA 3 PO) Take 750 mg by mouth daily.    Donell Sievert IN Inhale into the lungs.     No current facility-administered medications on file prior to visit.    Allergies  Allergen Reactions  . Ace Inhibitors     REACTION: Coughing   Family History  Problem Relation Age of Onset  . Hyperlipidemia Mother   . Hypertension Mother   . Hyperlipidemia Father   . Hypertension Father   . Heart attack  Father   . Hyperlipidemia Brother   . Diabetes Maternal Grandmother   . Diabetes Maternal Grandfather   . Colon cancer Neg Hx   . Stomach cancer Neg Hx   . Esophageal cancer Neg Hx   . Rectal cancer Neg Hx     PE: BP 134/82 (BP Location: Left Arm, Patient Position: Sitting)   Pulse 66   Wt 222 lb (100.7 kg)   SpO2 97%   BMI 30.96 kg/m  Wt Readings from Last 3 Encounters:  03/19/17 222 lb (100.7 kg)  03/15/17 224 lb (101.6 kg)  11/30/16 229 lb (103.9 kg)   Constitutional: overweight, in NAD Eyes: PERRLA, EOMI, no exophthalmos ENT: moist mucous membranes, + large thyroid nodule palpated in the isthmus, no cervical lymphadenopathy Cardiovascular: RRR, No MRG Respiratory: CTA B Gastrointestinal: abdomen soft, NT, ND, BS+ Musculoskeletal: no deformities, strength intact in all 4 Skin: moist, warm, no rashes Neurological: no tremor with outstretched hands, DTR normal in all 4  ASSESSMENT: 1. DM2, non-insulin-dependent, uncontrolled, without long term complications - h/o DKA  2. Thyroid nodules  PLAN:  1. Patient with long-standing, Uncontrolled diabetes, on oral antidiabetic regimen, now with improved control after we added Jardiance. Sugars are still at or close to goal despite vacation, however, they are still slightly high in the morning.  After breakfast and dinner, they may drop at or lower than 200. Therefore, will try to reduce the glipizide dose. I advised him to increase the dose before dinner if he has a particularly large dinner. - He continues to lose weight, for which I congratulated him - I suggested to:  Patient Instructions  Please continue: - Metformin 1000 mg 2x a day, with meals - Jardiance 25 mg before b'fast  Please decrease: - Glipizide to 5 mg 2x a day before b'fast and before dinner  Please come back for a follow-up appointment in 3 months.  - today, HbA1c is 6.2% (great!) - continue checking sugars at different times of the day - check 1x a day, rotating checks - advised for yearly eye exams >> he is UTD - Return to clinic in 3 mo with sugar log   2. Thyroid nodules  - he has a history of 2 thyroid nodules that have been evaluated in the past by [facility] (2014), with benign results - He denies neck compression symptoms - I reviewed the report of his most recent ultrasound that showed stable nodules, therefore, no intervention is needed for now  Philemon Kingdom, MD PhD [facility]

## 2017-04-27 ENCOUNTER — Encounter: Payer: Self-pay | Admitting: Family Medicine

## 2017-04-29 ENCOUNTER — Emergency Department (HOSPITAL_COMMUNITY)
Admission: EM | Admit: 2017-04-29 | Discharge: 2017-04-29 | Disposition: A | Discharge status: Home / Self Care | Payer: Medicare Other | Attending: Emergency Medicine | Admitting: Emergency Medicine

## 2017-04-29 ENCOUNTER — Encounter (HOSPITAL_COMMUNITY): Payer: Self-pay | Admitting: *Deleted

## 2017-04-29 DIAGNOSIS — Z5321 Procedure and treatment not carried out due to patient leaving prior to being seen by health care provider: Secondary | ICD-10-CM | POA: Insufficient documentation

## 2017-04-29 DIAGNOSIS — R109 Unspecified abdominal pain: Secondary | ICD-10-CM | POA: Diagnosis not present

## 2017-04-29 LAB — URINALYSIS, ROUTINE W REFLEX MICROSCOPIC
BACTERIA UA: NONE SEEN
Bilirubin Urine: NEGATIVE
Glucose, UA: >=500 mg/dL — AB
Ketones, ur: 5 mg/dL — AB
Leukocytes, UA: NEGATIVE
Nitrite: NEGATIVE
PROTEIN: NEGATIVE mg/dL
SPECIFIC GRAVITY, URINE: 1.031 — AB (ref 1.005–1.030)
pH: 5 (ref 5.0–8.0)

## 2017-04-29 NOTE — ED Triage Notes (Signed)
Pt stated "began having right side pain yesterday, yesterday morning it felt like it was centrally located in my abd."  Pt denies n/v/d or dysuria.

## 2017-04-29 NOTE — ED Notes (Signed)
PT GIVEN UPDATE ABOUT WAIT

## 2017-04-29 NOTE — ED Notes (Signed)
Pt left due to waits after being updated about wait time

## 2017-04-30 ENCOUNTER — Ambulatory Visit (INDEPENDENT_AMBULATORY_CARE_PROVIDER_SITE_OTHER): Payer: Medicare Other | Admitting: Family Medicine

## 2017-04-30 ENCOUNTER — Encounter: Payer: Self-pay | Admitting: Family Medicine

## 2017-04-30 VITALS — BP 128/76 | Temp 98.2°F | Wt 218.0 lb

## 2017-04-30 DIAGNOSIS — R109 Unspecified abdominal pain: Secondary | ICD-10-CM | POA: Diagnosis not present

## 2017-04-30 DIAGNOSIS — N200 Calculus of kidney: Secondary | ICD-10-CM | POA: Insufficient documentation

## 2017-04-30 LAB — POC URINALSYSI DIPSTICK (AUTOMATED)
BILIRUBIN UA: NEGATIVE
LEUKOCYTES UA: NEGATIVE
NITRITE UA: NEGATIVE
PH UA: 5.5 (ref 5.0–8.0)
Spec Grav, UA: 1.02 (ref 1.010–1.025)
Urobilinogen, UA: 0.2 E.U./dL

## 2017-04-30 MED ORDER — TRAMADOL HCL 50 MG PO TABS: 50.0000 mg | ORAL_TABLET | Freq: Three times a day (TID) | ORAL | 1 refills | Status: DC | PRN

## 2017-04-30 NOTE — Progress Notes (Signed)
Robert Cordova is a 66 year old married male nonsmoker who comes in today for evaluation of right flank pain  Around 12 noon this past Saturday he had the sudden onset of diffuse abdominal pain and within a short period time it moved to his right flank. He couldn't sleep because of the pain. He went to an emergency room however they were unable to see them because they were busy. He then went to an urgent care center on [address] they were also too busy to see him. He then went home.  He's never had a kidney stone the past although his sons had multiple stones.  Review of systems otherwise negative  Urinalysis done yesterday in the emergency room was normal urinalysis here today shows red cells  BP 128/76 (BP Location: Left Arm, Patient Position: Sitting, Cuff Size: Normal)   Temp 98.2 F (36.8 C) (Oral)   Wt 218 lb (98.9 kg)   SpO2 96%   BMI 31.28 kg/m  General he is well-developed well-nourished male no acute distress vital signs stable is afebrile examination spine abdomen all normal  Urinalysis shows red cells  #1 kidney stone right sided........... drink lots of water to keep a good urine flow..... If the stone does not pass in a week to 10 days call the urology center for further evaluation

## 2017-04-30 NOTE — Patient Instructions (Addendum)
Drink lots of water  If the stone does not pass in the next week to 10 days.........[name] or your pain becomes worse..... Call the urology center for further evaluation  Tramadol 50 mg.......... one every 6-8 hours when necessary for severe pain

## 2017-05-15 ENCOUNTER — Ambulatory Visit (INDEPENDENT_AMBULATORY_CARE_PROVIDER_SITE_OTHER): Payer: Medicare Other | Admitting: Family Medicine

## 2017-05-15 ENCOUNTER — Encounter: Payer: Self-pay | Admitting: Family Medicine

## 2017-05-15 VITALS — BP 121/62 | HR 78 | Temp 98.0°F | Resp 16 | Ht 70.0 in | Wt 221.1 lb

## 2017-05-15 DIAGNOSIS — Z23 Encounter for immunization: Secondary | ICD-10-CM

## 2017-05-15 DIAGNOSIS — R351 Nocturia: Secondary | ICD-10-CM

## 2017-05-15 DIAGNOSIS — I10 Essential (primary) hypertension: Secondary | ICD-10-CM

## 2017-05-15 DIAGNOSIS — E042 Nontoxic multinodular goiter: Secondary | ICD-10-CM | POA: Diagnosis not present

## 2017-05-15 DIAGNOSIS — E7841 Elevated Lipoprotein(a): Secondary | ICD-10-CM

## 2017-05-15 DIAGNOSIS — N401 Enlarged prostate with lower urinary tract symptoms: Secondary | ICD-10-CM | POA: Diagnosis not present

## 2017-05-15 DIAGNOSIS — E1110 Type 2 diabetes mellitus with ketoacidosis without coma: Secondary | ICD-10-CM | POA: Diagnosis not present

## 2017-05-15 LAB — HEPATIC FUNCTION PANEL
ALT: 16 U/L (ref 0–53)
AST: 9 U/L (ref 0–37)
Albumin: 4.2 g/dL (ref 3.5–5.2)
Alkaline Phosphatase: 47 U/L (ref 39–117)
BILIRUBIN DIRECT: 0.1 mg/dL (ref 0.0–0.3)
BILIRUBIN TOTAL: 0.4 mg/dL (ref 0.2–1.2)
Total Protein: 7 g/dL (ref 6.0–8.3)

## 2017-05-15 LAB — MICROALBUMIN / CREATININE URINE RATIO
CREATININE, U: 79.3 mg/dL
MICROALB/CREAT RATIO: 0.9 mg/g (ref 0.0–30.0)
Microalb, Ur: <0.7 mg/dL (ref 0.0–1.9)

## 2017-05-15 LAB — BASIC METABOLIC PANEL
BUN: 19 mg/dL (ref 6–23)
CHLORIDE: 105 meq/L (ref 96–112)
CO2: 25 mEq/L (ref 19–32)
CREATININE: 0.65 mg/dL (ref 0.40–1.50)
Calcium: 10.1 mg/dL (ref 8.4–10.5)
GFR: 129.87 mL/min (ref >60.00)
Glucose, Bld: 129 mg/dL — ABNORMAL HIGH (ref 70–99)
POTASSIUM: 4.6 meq/L (ref 3.5–5.1)
Sodium: 139 mEq/L (ref 135–145)

## 2017-05-15 LAB — CBC WITH DIFFERENTIAL/PLATELET
BASOS PCT: 1.1 % (ref 0.0–3.0)
Basophils Absolute: 0.1 10*3/uL (ref 0.0–0.1)
Eosinophils Absolute: 0.1 10*3/uL (ref 0.0–0.7)
Eosinophils Relative: 1.7 % (ref 0.0–5.0)
HEMATOCRIT: 41.3 % (ref 39.0–52.0)
Hemoglobin: 13.7 g/dL (ref 13.0–17.0)
LYMPHS PCT: 21.8 % (ref 12.0–46.0)
Lymphs Abs: 1.6 10*3/uL (ref 0.7–4.0)
MCHC: 33.2 g/dL (ref 30.0–36.0)
MCV: 93.9 fl (ref 78.0–100.0)
MONOS PCT: 8.4 % (ref 3.0–12.0)
Monocytes Absolute: 0.6 10*3/uL (ref 0.1–1.0)
NEUTROS ABS: 4.9 10*3/uL (ref 1.4–7.7)
Neutrophils Relative %: 67 % (ref 43.0–77.0)
PLATELETS: 427 10*3/uL — AB (ref 150.0–400.0)
RBC: 4.4 Mil/uL (ref 4.22–5.81)
RDW: 13.1 % (ref 11.5–15.5)
WBC: 7.3 10*3/uL (ref 4.0–10.5)

## 2017-05-15 LAB — TSH: TSH: 0.47 u[IU]/mL (ref 0.35–4.50)

## 2017-05-15 LAB — PSA: PSA: 1.97 ng/mL (ref 0.10–4.00)

## 2017-05-15 MED ORDER — ATORVASTATIN CALCIUM 20 MG PO TABS: 20.0000 mg | ORAL_TABLET | Freq: Every day | ORAL | 4 refills | Status: DC

## 2017-05-15 MED ORDER — HYDROCHLOROTHIAZIDE 25 MG PO TABS: ORAL_TABLET | ORAL | 3 refills | Status: DC

## 2017-05-15 MED ORDER — LOSARTAN POTASSIUM 50 MG PO TABS: 50.0000 mg | ORAL_TABLET | Freq: Every day | ORAL | 4 refills | Status: DC

## 2017-05-15 NOTE — Progress Notes (Deleted)
Pre visit review using our clinic review tool, if applicable. No additional management support is needed unless otherwise documented below in the visit note. 

## 2017-05-15 NOTE — Patient Instructions (Signed)
Continue current medications  Start a walking program......... 30 minutes daily  Return in one year for general physical exam sooner if any problem  Labs today............. I will call you if there is anything abnormal

## 2017-05-15 NOTE — Progress Notes (Signed)
Robert Cordova is a 66 year old married male nonsmoker who comes in today for evaluation of hyperlipidemia, diabetes, hypertension, and overweight BMI 31.28 obese class I  He takes aspirin and 20 mg of Lipitor daily for hyperlipidemia. Will check labs today. Last LDL was 66 April 2018.  He uses Glucotrol 5 mg twice a day, metformin 1000 mg twice a day and jardiance 25 mg daily. Last A1c August 2018 was 6.3%.  He sees Dr. Lorie Apley on a regular basis  He increase his Cozaar from 25 mg daily to 25 mg twice a day 2 months ago because his blood pressure went up to the 140 and 245 range systolic. BP today 121/62.  He uses steroid nasal spray for allergic rhinitis  He gets routine eye care, dental care, colonoscopy 2014 was normal  Vaccinations up-to-date seasonal flu shot given today  14 point review of systems reviewed and otherwise negative  EKG was done because of the above history of hypertension hyperlipidemia and diabetes. EKG was normal and unchanged from previous cardiograms.  Cognitive function normal he does not exercise on a regular basis, home health safety reviewed no issues identified, no guns in the house, he does have a healthcare power of attorney and living well.  No neuropathy  Information given on shingles vaccine  BP 121/62   Pulse 78   Temp 98 F (36.7 C) (Oral)   Resp 16   Ht 5\' 10"  (1.778 m)   Wt 221 lb 2 oz (100.3 kg)   SpO2 97%   BMI 31.73 kg/m  General he is a well-developed well-nourished male no acute distress examination HEENT was negative the cataracts he describes are very very very very juvenile. Neck was supple thyroid is symmetrically enlarged. enlarged no carotid bruits cardiopulmonary exam normal normal exam normal genitalia normal circumcised male. Rectal normal stool guaiac-negative prostate normal extremities normal skin normal peripheral pulses normal. No neuropathy  #1 diabetes type 2 under good control with A1c is 6.3%......... continue current  treatment program except start a walking program daily  #2 hypertension at goal with increase in Cozaar to 50 mg daily.......... continue 50 mg daily  #3 hyperlipidemia......... continue 20 mg of Lipitor last LDL was 66 spring 2018.  #4 symptoms of BPH with nocturia and dribbling..  #5.... check PSA.  #6 multinodular goiter........... asymptomatic follow TSH

## 2017-05-18 ENCOUNTER — Ambulatory Visit (INDEPENDENT_AMBULATORY_CARE_PROVIDER_SITE_OTHER): Payer: Self-pay | Admitting: Orthopaedic Surgery

## 2017-06-04 ENCOUNTER — Ambulatory Visit (INDEPENDENT_AMBULATORY_CARE_PROVIDER_SITE_OTHER): Payer: Medicare Other

## 2017-06-04 ENCOUNTER — Ambulatory Visit (INDEPENDENT_AMBULATORY_CARE_PROVIDER_SITE_OTHER): Payer: Medicare Other | Admitting: Orthopaedic Surgery

## 2017-06-04 ENCOUNTER — Encounter (INDEPENDENT_AMBULATORY_CARE_PROVIDER_SITE_OTHER): Payer: Self-pay | Admitting: Orthopaedic Surgery

## 2017-06-04 VITALS — BP 138/78 | HR 85 | Resp 14 | Ht 72.0 in | Wt 225.0 lb

## 2017-06-04 DIAGNOSIS — G8929 Other chronic pain: Secondary | ICD-10-CM

## 2017-06-04 DIAGNOSIS — M25512 Pain in left shoulder: Secondary | ICD-10-CM

## 2017-06-04 MED ORDER — METHYLPREDNISOLONE ACETATE 40 MG/ML IJ SUSP
80.0000 mg | INTRAMUSCULAR | Status: AC | PRN
  Administered 2017-06-04: 80 mg

## 2017-06-04 MED ORDER — LIDOCAINE HCL 1 % IJ SOLN
2.0000 mL | INTRAMUSCULAR | Status: AC | PRN
  Administered 2017-06-04: 2 mL

## 2017-06-04 MED ORDER — BUPIVACAINE HCL 0.5 % IJ SOLN
2.0000 mL | INTRAMUSCULAR | Status: AC | PRN
  Administered 2017-06-04: 2 mL via INTRA_ARTICULAR

## 2017-06-04 NOTE — Progress Notes (Signed)
Office Visit Note   Patient: Robert Cordova           Date of Birth: 17-Jan-1951           MRN: [id number] Visit Date: 06/04/2017              Requested by: Dorena Cookey, MD [place], [place] [address] PCP: Dorena Cookey, MD   Assessment & Plan: Visit Diagnoses:  1. Chronic left shoulder pain    Impingement syndrome left shoulder. Possible rotator cuff tear. Plan: Long discussion regarding x-ray findings with minimal early arthritis. Also discussed treatment options. We'll start with a subacromial cortisone injection and monitor her response. If no improvement consider MRI scan  Follow-Up Instructions: Return if symptoms worsen or fail to improve.   Orders:  Orders Placed This Encounter  Procedures  . XR Shoulder Left   No orders of the defined types were placed in this encounter.     Procedures: Large Joint Inj Date/Time: 06/04/2017 1:38 PM Performed by: Garald Balding Authorized by: Garald Balding   Consent Given by:  Patient Timeout: prior to procedure the correct patient, procedure, and site was verified   Indications:  Pain Location:  Shoulder Site:  L subacromial bursa Prep: patient was prepped and draped in usual sterile fashion   Needle Size:  25 G Needle Length:  1.5 inches Approach:  Lateral Ultrasound Guidance: No   Fluoroscopic Guidance: No   Arthrogram: No   Medications:  80 mg methylPREDNISolone acetate 40 MG/ML; 2 mL lidocaine 1 %; 2 mL bupivacaine 0.5 % Aspiration Attempted: No   Patient tolerance:  Patient tolerated the procedure well with no immediate complications     Clinical Data: No additional findings.   Subjective: Chief Complaint  Patient presents with  . Left Shoulder - Pain    Robert Cordova is a 66 y o that is here for Left shoulder pain x 3 months ago. Limited ROM when lifting,   Insidious onset of left shoulder pain approximately 3 months ago. I think difficulty raising his arm over  70 and sleeping on that side. He is self-employed. Does spend some time outside working but does not remember one specific injury or trauma. He did have some bilateral shoulder pain 6-7 years ago that slowly resolve without further treatment or diagnostic studies. Presently left shoulder is causing a problem with overhead activity. No numbness or tingling. No neck pain no history of ecchymosis or induration  HPI  Review of Systems  Constitutional: Negative for fatigue.  HENT: Negative for hearing loss.   Respiratory: Negative for apnea, chest tightness and shortness of breath.   Cardiovascular: Negative for chest pain, palpitations and leg swelling.  Gastrointestinal: Negative for blood in stool, constipation and diarrhea.  Genitourinary: Negative for difficulty urinating.  Musculoskeletal: Negative for arthralgias, back pain, joint swelling, myalgias, neck pain and neck stiffness.  Neurological: Positive for weakness. Negative for numbness and headaches.  Hematological: Does not bruise/bleed easily.  Psychiatric/Behavioral: Negative for sleep disturbance. The patient is not nervous/anxious.      Objective: Vital Signs: BP 138/78   Pulse 85   Resp 14   Ht 6' (1.829 m)   Wt 225 lb (102.1 kg)   BMI 30.52 kg/m   Physical Exam  Ortho Exam awake alert and oriented 3. Comfortable sitting. Local tenderness along the anterior and lateral subacromial region. No pain at the acromioclavicular joint. Biceps intact. Good strength. Positive impingement testing. Positive empty can testing.  Neurovascular exam intact  Specialty Comments:  No specialty comments available.  Imaging: Xr Shoulder Left  Result Date: 06/04/2017 Films of her left shoulder obtained in several projections. The humeral head is centered about the glenoid. Some irregularity along the greater tuberosity. Normal space between the humeral head and the acromium. Some degenerative changes at the acromioclavicular joint. Type I  acromion. Possible early arthritis of the glenohumeral joint    PMFS History: Patient Active Problem List   Diagnosis Date Noted  . BPH associated with nocturia 05/15/2017  . Flank pain 04/30/2017  . Kidney stone 04/30/2017  . Uncontrolled type 2 diabetes mellitus with ketoacidosis without coma, without long-term current use of insulin ([place]) 10/13/2016  . Multiple thyroid nodules 10/13/2016  . Nocturnal hypoxemia 06/04/2014  . Lapband APS + hiatus hernia repair April 2015 11/25/2013  . Obesity 11/25/2013  . OSA (obstructive sleep apnea) 07/08/2013  . Goiter 04/18/2013  . Obesity, unspecified 04/10/2013  . Osteoarthritis 07/26/2011  . BLEPHARITIS, LEFT 10/29/2009  . NEVUS, MELANOCYTIC, FACE 10/07/2009  . Hyperlipemia 10/22/2007  . HEMATURIA UNSPECIFIED 08/09/2007  . Essential hypertension 03/18/2007  . NECK PAIN, CHRONIC 03/18/2007   Past Medical History:  Diagnosis Date  . ABNORMAL GLUCOSE NEC 03/18/2007  . Arthritis    fingers  . BLEPHARITIS, LEFT 10/29/2009  . DIABETES-TYPE 2 04/18/2010  . Dry throat   . Environmental allergies   . GERD (gastroesophageal reflux disease)   . H/O hiatal hernia   . HYPERLIPIDEMIA 10/22/2007  . HYPERTENSION 03/18/2007  . Multiple thyroid nodules    "biopsy done, everything was fine" being monitored  . NEVUS, MELANOCYTIC, FACE 10/07/2009    Family History  Problem Relation Age of Onset  . Hyperlipidemia Mother   . Hypertension Mother   . Hyperlipidemia Father   . Hypertension Father   . Heart attack Father   . Hyperlipidemia Brother   . Diabetes Maternal Grandmother   . Diabetes Maternal Grandfather   . Colon cancer Neg Hx   . Stomach cancer Neg Hx   . Esophageal cancer Neg Hx   . Rectal cancer Neg Hx     Past Surgical History:  Procedure Laterality Date  . BIOPSY THYROID  ~08/2013  . BREATH TEK H PYLORI N/A 09/05/2013   Procedure: BREATH TEK H PYLORI;  Surgeon: [name], MD;  Location: [facility];  Service: General;   Laterality: N/A;  . COLONOSCOPY  2009, 2013   Dr. Sharlett Iles; multiple polyps  . HIATAL HERNIA REPAIR  11/25/2013   Procedure: LAPAROSCOPIC REPAIR OF HIATAL HERNIA;  Surgeon: [name], MD;  Location: [facility];  Service: General;;  . LAPAROSCOPIC GASTRIC BANDING N/A 11/25/2013   Procedure: LAPAROSCOPIC GASTRIC BANDING;  Surgeon: [name], MD;  Location: [facility];  Service: General;  Laterality: N/A;  . NO PAST SURGERIES     Social History   Occupational History  . Consultant-- self-employeed    Social History Main Topics  . Smoking status: Never Smoker  . Smokeless tobacco: Never Used  . Alcohol use No  . Drug use: No  . Sexual activity: Not on file

## 2017-06-19 ENCOUNTER — Ambulatory Visit (INDEPENDENT_AMBULATORY_CARE_PROVIDER_SITE_OTHER): Payer: Medicare Other | Admitting: Internal Medicine

## 2017-06-19 ENCOUNTER — Encounter: Payer: Self-pay | Admitting: Internal Medicine

## 2017-06-19 VITALS — BP 138/82 | HR 84 | Wt 222.0 lb

## 2017-06-19 DIAGNOSIS — E042 Nontoxic multinodular goiter: Secondary | ICD-10-CM | POA: Diagnosis not present

## 2017-06-19 DIAGNOSIS — E1110 Type 2 diabetes mellitus with ketoacidosis without coma: Secondary | ICD-10-CM

## 2017-06-19 LAB — POCT GLYCOSYLATED HEMOGLOBIN (HGB A1C): HEMOGLOBIN A1C: 6.3

## 2017-06-19 MED ORDER — GLUCOSE BLOOD VI STRP: ORAL_STRIP | 12 refills | Status: DC

## 2017-06-19 NOTE — Patient Instructions (Addendum)
Please continue: - Metformin 1000 mg 2x a day, with meals - Glipizide 5 mg 2x a day before b'fast and before dinner (you may need to increase glipizide before dinner to 2 tablets before a large meal) - Jardiance 25 mg before b'fast  Please limit snacks at night.  Please return in 3 months with your sugar log.

## 2017-06-19 NOTE — Progress Notes (Signed)
Patient ID: Robert Cordova, male   DOB: 04-27-1951, 66 y.o.   MRN: [id number]   HPI: Robert Cordova is a 66 y.o.-year-old male, returning for f/u for DM2, dx in 2013, non-insulin-dependent, uncontrolled, with complications (h/o DKA). Last visit 3 mo ago.  Last hemoglobin A1c was: Lab Results  Component Value Date   HGBA1C 6.3 03/19/2017   HGBA1C 6.9 11/30/2016   HGBA1C 7.6 (H) 02/15/2016  08/22/2016: 7.7%  He is on: - Metformin 1000 mg 2x a day, with meals - Glipizide 10 >> 5 mg 2x a day before b'fast and before dinner - Jardiance 25 mg before b'fast Of note, He was off and on insulin in the past. He came off when he lost weight after his lab band surgery in 2014 (45 pounds)  He is checking sugars 1-2x a day: - am: 186-238 >> 120-161, 175 >> 102-147, 168 >> 127-168 - 2h after b'fast: 178 >> 168 >> 108 >> n/c - before lunch: n/c >> 100 >> 71-108, 178 - 2h after lunch: 150 >> n/c >> 100, 153 - before dinner: n/c >> 97-114 >> 131 >> n/c - 2h after dinner: 225-345 >> 93-198 >> n/c >> 328 (cheese cake) - bedtime: 221 >> n/c >> 86-157, 279 x1 (many sweets, drinks) >> 101- (130s)-299 - nighttime: n/c >> 212 Lowest sugar was 150 >> 97 >> 86 >> 101; he has hypoglycemia awareness at 70.  Highest sugar was 345 >> 198 >> 279 >> 328.  Pt's meals are: - Breakfast: Protein shake - Lunch: Eggs, bacon or leftovers - Dinner: Meat + veggies + starch, pizza, [place] food - Snacks: In the evening: cookies, etc.  - no CKD, last BUN/creatinine:  Lab Results  Component Value Date   BUN 19 05/15/2017   BUN 20 11/30/2016   CREATININE 0.65 05/15/2017   CREATININE 0.79 11/30/2016  On losartan 25 mg twice a day. - + HL; last set of lipids: Lab Results  Component Value Date   CHOL 126 11/30/2016   HDL 35.60 (L) 11/30/2016   LDLCALC 57 11/09/2015   LDLDIRECT 66.0 11/30/2016   TRIG 213.0 (H) 11/30/2016   CHOLHDL 4 11/30/2016  On Lipitor, CoQ10 and krill oil. - last eye exam was on  11/2016: No DR. - Denies numbness and tingling in his feet.  He has thyroid nodules - s/p 2 benign Bx'es in 2014.  Latest TSH - normal: Lab Results  Component Value Date   TSH 0.47 05/15/2017   Reviewed latest U/S report along with the pt: A thyroid U/S (10/20/2016): Stable 2.8 cm solid nodule in the inferior right lobe. This was previously sampled with negative biopsy. The 1.8 cm nodule previously sampled in the right isthmus is no longer identifiable by ultrasound.  He also sleep-related hypoxia, HTN, HL.  He has Medicare with 2 supplemental insurances.  ROS: Constitutional: no weight gain/no weight loss, no fatigue, no subjective hyperthermia, no subjective hypothermia Eyes: no blurry vision, no xerophthalmia ENT: no sore throat, no nodules palpated in throat, no dysphagia, no odynophagia, no hoarseness Cardiovascular: no CP/no SOB/no palpitations/no leg swelling Respiratory: no cough/no SOB/no wheezing Gastrointestinal: no N/no V/no D/no C/no acid reflux Musculoskeletal: no muscle aches/no joint aches Skin: no rashes, no hair loss Neurological: no tremors/no numbness/no tingling/no dizziness  I reviewed pt's medications, allergies, PMH, social hx, family hx, and changes were documented in the history of present illness. Otherwise, unchanged from my initial visit note.  Past Medical History:  Diagnosis Date  . ABNORMAL GLUCOSE NEC  03/18/2007  . Arthritis    fingers  . BLEPHARITIS, LEFT 10/29/2009  . DIABETES-TYPE 2 04/18/2010  . Dry throat   . Environmental allergies   . GERD (gastroesophageal reflux disease)   . H/O hiatal hernia   . HYPERLIPIDEMIA 10/22/2007  . HYPERTENSION 03/18/2007  . Multiple thyroid nodules    "biopsy done, everything was fine" being monitored  . NEVUS, MELANOCYTIC, FACE 10/07/2009   Past Surgical History:  Procedure Laterality Date  . BIOPSY THYROID  ~08/2013  . COLONOSCOPY  2009, 2013   Dr. Sharlett Iles; multiple polyps  . NO PAST SURGERIES      Social History   Social History  . Marital status: Married    Spouse name: N/A  . Number of children: 2   Occupational History  . 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 Smoking status: Never Smoker  . Smokeless tobacco: Never Used  . Alcohol use No  . Drug use: No   Current Outpatient Medications on File Prior to Visit  Medication Sig Dispense Refill  . aspirin 81 MG tablet Take 1 tablet (81 mg total) by mouth daily. 30 tablet 5  . atorvastatin (LIPITOR) 20 MG tablet Take 1 tablet (20 mg total) by mouth daily. 90 tablet 4  . Cholecalciferol (VITAMIN D PO) Take 50 mcg by mouth daily.    . Coenzyme Q10 (CO Q-10) 200 MG CAPS Take by mouth daily.    . empagliflozin (JARDIANCE) 25 MG TABS tablet Take 25 mg by mouth daily. 90 tablet 1  . fluticasone (FLONASE) 50 MCG/ACT nasal spray Place 2 sprays into both nostrils daily. 1 g 2  . glipiZIDE (GLUCOTROL) 5 MG tablet Take 1 tablet (5 mg total) by mouth 2 (two) times daily before a meal. 180 tablet 3  . hydrochlorothiazide (HYDRODIURIL) 25 MG tablet TAKE 1 TABLET (25 MG TOTAL) BY MOUTH DAILY. 90 tablet 3  . losartan (COZAAR) 50 MG tablet Take 1 tablet (50 mg total) by mouth daily. 90 tablet 4  . Magnesium 500 MG TABS Take by mouth daily.    . metFORMIN (GLUCOPHAGE) 500 MG tablet TAKE 2 TABLETs(500 MG TOTAL) BY MOUTH 2 (TWO) TIMES DAILY WITH A MEAL. 400 tablet 3  . minoxidil (ROGAINE) 2 % external solution Apply 1 application topically daily. 60 mL 10  . Omega-3 Fatty Acids (OMEGA 3 PO) Take 750 mg by mouth daily.    Donell Sievert IN Inhale into the lungs.     No current facility-administered medications on file prior to visit.    Allergies  Allergen Reactions  . Ace Inhibitors     REACTION: Coughing   Family History  Problem Relation Age of Onset  . Hyperlipidemia Mother   . Hypertension Mother   . Hyperlipidemia Father   . Hypertension Father   . Heart attack Father   . Hyperlipidemia Brother    . Diabetes Maternal Grandmother   . Diabetes Maternal Grandfather   . Colon cancer Neg Hx   . Stomach cancer Neg Hx   . Esophageal cancer Neg Hx   . Rectal cancer Neg Hx     PE: BP 138/82 (BP Location: Left Arm, Patient Position: Sitting)   Pulse 84   Wt 222 lb (100.7 kg)   SpO2 96%   BMI 30.11 kg/m  Wt Readings from Last 3 Encounters:  06/19/17 222 lb (100.7 kg)  06/04/17 225 lb (102.1 kg)  05/15/17 221 lb 2 oz (100.3 kg)   Constitutional: overweight, in  NAD Eyes: PERRLA, EOMI, no exophthalmos ENT: moist mucous membranes, no thyromegaly, + large thyroid nodule palpated in the isthmus, no cervical lymphadenopathy Cardiovascular: RRR, No MRG Respiratory: CTA B Gastrointestinal: abdomen soft, NT, ND, BS+ Musculoskeletal: no deformities, strength intact in all 4 Skin: moist, warm, no rashes Neurological: no tremor with outstretched hands, DTR normal in all 4  ASSESSMENT: 1. DM2, non-insulin-dependent, uncontrolled, without long term complications - h/o DKA  2. Thyroid nodules  PLAN:  1. Patient with long-standing,  Uncontrolled DM, with slightly higher hyperglycemic spikes at this visit >> snacks more especially in the pm and evening. Highest: >300 (cheesecake). Discussed the need to reduce snacking and also to limit higher fat foods, to reduce his insulin resistance. - for a larger dinner advised him to try 2 glipizide tablets - he did not gain weight, which is great! - I suggested to:  Patient Instructions  Please continue: - Metformin 1000 mg 2x a day, with meals - Glipizide 5 mg 2x a day before b'fast and before dinner (you may need to increase glipizide before dinner to 2 tablets before a large meal) - Jardiance 25 mg before b'fast  Please limit snacks at night.  Please return in 3 months with your sugar log.   - today, HbA1c is 6.3% (stable) - continue checking sugars at different times of the day - check 1x a day, rotating checks - advised for yearly eye  exams >> he is UTD - UTD with flu shot - Return to clinic in 3 mo with sugar log    2. Thyroid nodules  - He has a history of 2 thyroid nodules that have been evaluated in the past by [facility] (2014), with benign results - No neck compression symptoms - We reviewed together the report of his most recent thyroid ultrasound that showed stable nodules, therefore, no intervention is needed for now  Philemon Kingdom, MD PhD [facility]

## 2017-06-19 NOTE — Addendum Note (Signed)
Addended by: Caprice Beaver T on: 06/19/2017 01:24 PM   Modules accepted: Orders

## 2017-06-27 ENCOUNTER — Encounter (HOSPITAL_COMMUNITY): Payer: Self-pay

## 2017-07-18 ENCOUNTER — Encounter: Payer: Self-pay | Admitting: Internal Medicine

## 2017-07-18 MED ORDER — EMPAGLIFLOZIN 25 MG PO TABS: 25.0000 mg | ORAL_TABLET | Freq: Every day | ORAL | 1 refills | Status: DC

## 2017-08-07 DIAGNOSIS — J9610 Chronic respiratory failure, unspecified whether with hypoxia or hypercapnia: Secondary | ICD-10-CM | POA: Diagnosis not present

## 2017-08-08 ENCOUNTER — Other Ambulatory Visit: Payer: Self-pay | Admitting: Adult Health

## 2017-08-08 ENCOUNTER — Other Ambulatory Visit: Payer: Self-pay | Admitting: Family Medicine

## 2017-08-08 DIAGNOSIS — J301 Allergic rhinitis due to pollen: Secondary | ICD-10-CM

## 2017-08-09 NOTE — Telephone Encounter (Signed)
Todd pt

## 2017-08-11 ENCOUNTER — Encounter: Payer: Self-pay | Admitting: Internal Medicine

## 2017-08-13 ENCOUNTER — Telehealth: Payer: Self-pay | Admitting: Pulmonary Disease

## 2017-08-13 ENCOUNTER — Telehealth: Payer: Self-pay | Admitting: Family Medicine

## 2017-08-13 MED ORDER — GLUCOSE BLOOD VI STRP: ORAL_STRIP | 12 refills | Status: DC

## 2017-08-13 MED ORDER — EMPAGLIFLOZIN 25 MG PO TABS: 25.0000 mg | ORAL_TABLET | Freq: Every day | ORAL | 1 refills | Status: DC

## 2017-08-13 NOTE — Telephone Encounter (Signed)
Spoke with patient. He stated that he recently changed insurance companies and was wondering if he needed a new order. Advised patient that the only time a new order would be needed is if he changed DME companies. He is currently with [facility].   Advised him to check with [facility] to see if they are in network and if he needs a new order, he will need to be seen. Last OV with [facility] was in 2016.   Patient verbalized understanding. Nothing else needed at time of call.

## 2017-08-13 NOTE — Telephone Encounter (Signed)
Copied from [place]. Topic: General - Other >> Aug 13, 2017  3:25 PM Darl Householder, RMA wrote: Reason for CRM: Medication refill request for losartan 50 mg and atorvastatin 20 mg to be sent to [facility] order, pt wants all of prescriptions to be sent to [place] cancel all refills going to [facility]

## 2017-08-14 ENCOUNTER — Other Ambulatory Visit: Payer: Self-pay

## 2017-08-14 MED ORDER — LOSARTAN POTASSIUM 50 MG PO TABS: 50.0000 mg | ORAL_TABLET | Freq: Every day | ORAL | 4 refills | Status: DC

## 2017-08-14 MED ORDER — ATORVASTATIN CALCIUM 20 MG PO TABS: 20.0000 mg | ORAL_TABLET | Freq: Every day | ORAL | 4 refills | Status: DC

## 2017-08-14 NOTE — Telephone Encounter (Signed)
Spoke with pt confirmed that he wants to cancel the Rx from [facility] to [facility]. Spoke with [facility] and rx has been cancelled and have been reordered with [facility], pt is aware.

## 2017-08-17 DIAGNOSIS — Z4651 Encounter for fitting and adjustment of gastric lap band: Secondary | ICD-10-CM | POA: Diagnosis not present

## 2017-09-07 DIAGNOSIS — J9610 Chronic respiratory failure, unspecified whether with hypoxia or hypercapnia: Secondary | ICD-10-CM | POA: Diagnosis not present

## 2017-09-14 DIAGNOSIS — Z4651 Encounter for fitting and adjustment of gastric lap band: Secondary | ICD-10-CM | POA: Diagnosis not present

## 2017-09-20 ENCOUNTER — Ambulatory Visit: Payer: Medicare Other | Admitting: Internal Medicine

## 2017-10-02 ENCOUNTER — Other Ambulatory Visit: Payer: Self-pay

## 2017-10-02 MED ORDER — TRUE METRIX AIR GLUCOSE METER W/DEVICE KIT: 1.0000 | PACK | 0 refills | Status: DC

## 2017-10-02 MED ORDER — TRUEPLUS LANCETS 33G MISC: 12 refills | Status: DC

## 2017-10-02 MED ORDER — GLUCOSE BLOOD VI STRP: ORAL_STRIP | 12 refills | Status: DC

## 2017-10-05 DIAGNOSIS — J9610 Chronic respiratory failure, unspecified whether with hypoxia or hypercapnia: Secondary | ICD-10-CM | POA: Diagnosis not present

## 2017-10-10 ENCOUNTER — Other Ambulatory Visit: Payer: Self-pay

## 2017-10-12 ENCOUNTER — Encounter: Payer: Self-pay | Admitting: Gastroenterology

## 2017-10-12 DIAGNOSIS — Z4651 Encounter for fitting and adjustment of gastric lap band: Secondary | ICD-10-CM | POA: Diagnosis not present

## 2017-10-18 ENCOUNTER — Ambulatory Visit: Payer: Medicare Other | Admitting: Internal Medicine

## 2017-11-05 DIAGNOSIS — J9610 Chronic respiratory failure, unspecified whether with hypoxia or hypercapnia: Secondary | ICD-10-CM | POA: Diagnosis not present

## 2017-11-15 ENCOUNTER — Ambulatory Visit: Payer: Medicare HMO | Admitting: Internal Medicine

## 2017-11-28 ENCOUNTER — Other Ambulatory Visit: Payer: Self-pay | Admitting: Internal Medicine

## 2017-12-05 DIAGNOSIS — J9610 Chronic respiratory failure, unspecified whether with hypoxia or hypercapnia: Secondary | ICD-10-CM | POA: Diagnosis not present

## 2018-01-05 DIAGNOSIS — J9610 Chronic respiratory failure, unspecified whether with hypoxia or hypercapnia: Secondary | ICD-10-CM | POA: Diagnosis not present

## 2018-01-10 DIAGNOSIS — H5210 Myopia, unspecified eye: Secondary | ICD-10-CM | POA: Diagnosis not present

## 2018-01-10 DIAGNOSIS — D3131 Benign neoplasm of right choroid: Secondary | ICD-10-CM | POA: Diagnosis not present

## 2018-01-10 LAB — HM DIABETES EYE EXAM

## 2018-01-25 ENCOUNTER — Encounter: Payer: Self-pay | Admitting: Internal Medicine

## 2018-01-25 ENCOUNTER — Ambulatory Visit (INDEPENDENT_AMBULATORY_CARE_PROVIDER_SITE_OTHER): Payer: Medicare HMO | Admitting: Internal Medicine

## 2018-01-25 VITALS — BP 130/70 | HR 63 | Ht 70.0 in | Wt 223.6 lb

## 2018-01-25 DIAGNOSIS — E042 Nontoxic multinodular goiter: Secondary | ICD-10-CM

## 2018-01-25 DIAGNOSIS — E1165 Type 2 diabetes mellitus with hyperglycemia: Secondary | ICD-10-CM

## 2018-01-25 DIAGNOSIS — E049 Nontoxic goiter, unspecified: Secondary | ICD-10-CM

## 2018-01-25 DIAGNOSIS — E785 Hyperlipidemia, unspecified: Secondary | ICD-10-CM | POA: Diagnosis not present

## 2018-01-25 LAB — TSH: TSH: 0.59 u[IU]/mL (ref 0.35–4.50)

## 2018-01-25 LAB — POCT GLYCOSYLATED HEMOGLOBIN (HGB A1C): Hemoglobin A1C: 6.6 % — AB (ref 4.0–5.6)

## 2018-01-25 LAB — COMPLETE METABOLIC PANEL WITH GFR
AG Ratio: 1.7 (calc) (ref 1.0–2.5)
ALBUMIN MSPROF: 4.5 g/dL (ref 3.6–5.1)
ALKALINE PHOSPHATASE (APISO): 47 U/L (ref 40–115)
ALT: 16 U/L (ref 9–46)
AST: 13 U/L (ref 10–35)
BILIRUBIN TOTAL: 0.3 mg/dL (ref 0.2–1.2)
BUN: 19 mg/dL (ref 7–25)
CHLORIDE: 104 mmol/L (ref 98–110)
CO2: 24 mmol/L (ref 20–32)
Calcium: 9.7 mg/dL (ref 8.6–10.3)
Creat: 0.78 mg/dL (ref 0.70–1.25)
GFR, EST AFRICAN AMERICAN: 107 mL/min/{1.73_m2} (ref >=60)
GFR, Est Non African American: 93 mL/min/{1.73_m2} (ref >=60)
GLUCOSE: 105 mg/dL — AB (ref 65–99)
Globulin: 2.6 g/dL (calc) (ref 1.9–3.7)
Potassium: 4.6 mmol/L (ref 3.5–5.3)
Sodium: 138 mmol/L (ref 135–146)
TOTAL PROTEIN: 7.1 g/dL (ref 6.1–8.1)

## 2018-01-25 LAB — LIPID PANEL
CHOLESTEROL: 117 mg/dL (ref 0–200)
HDL: 40 mg/dL (ref >39.00)
LDL CALC: 51 mg/dL (ref 0–99)
NonHDL: 77.17
Total CHOL/HDL Ratio: 3
Triglycerides: 131 mg/dL (ref 0.0–149.0)
VLDL: 26.2 mg/dL (ref 0.0–40.0)

## 2018-01-25 NOTE — Addendum Note (Signed)
Addended by: Drucilla Schmidt on: 01/25/2018 01:55 PM   Modules accepted: Orders

## 2018-01-25 NOTE — Patient Instructions (Addendum)
Please move all Meformin 2000 mg daily with dinner.  Please continue: - Glipizide 5 mg before b'fast and 10 mg before dinner - Jardiance 25 mg before b'fast  Please return in 4 months with your sugar log.   Please stop at the lab.

## 2018-01-25 NOTE — Progress Notes (Signed)
Patient ID: Robert Cordova, male   DOB: Jan 03, 1951, 67 y.o.   MRN: [id number]   HPI: Robert Cordova is a 67 y.o.-year-old male, returning for f/u for DM2, dx in 2013, non-insulin-dependent, uncontrolled, with complications (h/o DKA). Last visit 7 months ago.  Last hemoglobin A1c was: Lab Results  Component Value Date   HGBA1C 6.3 06/19/2017   HGBA1C 6.3 03/19/2017   HGBA1C 6.9 11/30/2016  08/22/2016: 7.7%  He is on: - Metformin 1000 mg 2x a day, with meals - Glipizide 5 mg before b'fast and 5 >> 10 mg before dinner - Jardiance 25 mg before b'fast Of note, He was off and on insulin in the past. He came off when he lost weight after his lab band surgery in 2014 (45 pounds)  He is checking sugars 1-2 X a day: - am: 102-147, 168 >> 127-168 >> 115-179, 213 - 2h after b'fast: 178 >> 168 >> 108 >> n/c >> 87-125, 147 - before lunch: n/c >> 100 >> 71-108, 178 >> 74-132 - 2h after lunch: 150 >> n/c >> 100, 153 >> n/c - before dinner: n/c >> 97-114 >> 131 >> n/c >> 90-124 - 2h after dinner: 93-198 >> n/c >> 328 (cheese cake) >> n/c - bedtime:  101- (130s)-299 >> 79-153, 201, 253 - nighttime: n/c >> 212 Lowest sugar was 101 >> 79; he has hypoglycemia awareness  in the 70s.  Highest sugar was 328 >> 213.  Pt's meals are: - Breakfast: Protein shake - Lunch: Eggs, bacon or leftovers - Dinner: Meat + veggies + starch, pizza, [place] food - Snacks: In the evening: cookies, etc.  - no CKD, last BUN/creatinine:  Lab Results  Component Value Date   BUN 19 05/15/2017   BUN 20 11/30/2016   CREATININE 0.65 05/15/2017   CREATININE 0.79 11/30/2016  On losartan 25 mg 2X a day. - + HL; last set of lipids: Lab Results  Component Value Date   CHOL 126 11/30/2016   HDL 35.60 (L) 11/30/2016   LDLCALC 57 11/09/2015   LDLDIRECT 66.0 11/30/2016   TRIG 213.0 (H) 11/30/2016   CHOLHDL 4 11/30/2016  On Lipitor, co-Q10, Krill oil. - last eye exam was on 01/2018: No DR, + cataract - small.  [facility]. - Denies numbness and tingling in his feet.  He has thyroid nodules - s/p 2 benign FNAs in 2014.  Latest TSH -normal: Lab Results  Component Value Date   TSH 0.47 05/15/2017   Reviewed latest thyroid ultrasound report: Thyroid U/S (10/20/2016): Stable 2.8 cm solid nodule in inferior right lobe .  This was previously sampled with negative biopsy.  The 1.8 cm nodule previously sampled in the right isthmus was not longer identifiable by ultrasound.  He also sleep-related hypoxia, HTN.  He has Medicare with 2 supplemental insurances.  ROS: Constitutional: no weight gain/no weight loss, no fatigue, no subjective hyperthermia, no subjective hypothermia, + nocturia Eyes: no blurry vision, no xerophthalmia ENT: no sore throat, no nodules palpated in throat, no dysphagia, no odynophagia, no hoarseness Cardiovascular: no CP/no SOB/no palpitations/no leg swelling Respiratory: no cough/no SOB/no wheezing Gastrointestinal: no N/no V/no D/no C/no acid reflux Musculoskeletal: no muscle aches/no joint aches Skin: no rashes, + chronic hair loss Neurological: no tremors/no numbness/no tingling/no dizziness  I reviewed pt's medications, allergies, PMH, social hx, family hx, and changes were documented in the history of present illness. Otherwise, unchanged from my initial visit note.  Past Medical History:  Diagnosis Date  . ABNORMAL GLUCOSE NEC 03/18/2007  .  Arthritis    fingers  . BLEPHARITIS, LEFT 10/29/2009  . DIABETES-TYPE 2 04/18/2010  . Dry throat   . Environmental allergies   . GERD (gastroesophageal reflux disease)   . H/O hiatal hernia   . HYPERLIPIDEMIA 10/22/2007  . HYPERTENSION 03/18/2007  . Multiple thyroid nodules    "biopsy done, everything was fine" being monitored  . NEVUS, MELANOCYTIC, FACE 10/07/2009   Past Surgical History:  Procedure Laterality Date  . BIOPSY THYROID  ~08/2013  . BREATH TEK H PYLORI N/A 09/05/2013   Procedure: BREATH TEK H PYLORI;   Surgeon: Robert Earls, MD;  Location: [facility];  Service: General;  Laterality: N/A;  . COLONOSCOPY  2009, 2013   Dr. Sharlett Cordova; multiple polyps  . HIATAL HERNIA REPAIR  11/25/2013   Procedure: LAPAROSCOPIC REPAIR OF HIATAL HERNIA;  Surgeon: Robert Earls, MD;  Location: [facility];  Service: General;;  . LAPAROSCOPIC GASTRIC BANDING N/A 11/25/2013   Procedure: LAPAROSCOPIC GASTRIC BANDING;  Surgeon: Robert Earls, MD;  Location: [facility];  Service: General;  Laterality: N/A;  . NO PAST SURGERIES     Social History   Social History  . Marital status: Married    Spouse name: N/A  . Number of children: 2   Occupational History  . 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 Smoking status: Never Smoker  . Smokeless tobacco: Never Used  . Alcohol use No  . Drug use: No   Current Outpatient Medications on File Prior to Visit  Medication Sig Dispense Refill  . aspirin 81 MG tablet Take 1 tablet (81 mg total) by mouth daily. 30 tablet 5  . atorvastatin (LIPITOR) 20 MG tablet Take 1 tablet (20 mg total) by mouth daily. 90 tablet 4  . Blood Glucose Monitoring Suppl (TRUE METRIX AIR GLUCOSE METER) w/Device KIT 1 Device by Other route as directed. 1 kit 0  . Cholecalciferol (VITAMIN D PO) Take 50 mcg by mouth daily.    . Coenzyme Q10 (CO Q-10) 200 MG CAPS Take by mouth daily.    . empagliflozin (JARDIANCE) 25 MG TABS tablet Take 25 mg by mouth daily. 90 tablet 1  . fluticasone (FLONASE) 50 MCG/ACT nasal spray USE 2 SPRAYS IN EACH NOSTRIL EVERY DAY 48 g 2  . glipiZIDE (GLUCOTROL) 5 MG tablet TAKE 1 TABLET TWICE DAILY BEFORE MEALS 180 tablet 3  . glucose blood (ONETOUCH VERIO) test strip Use as instructed x 1 a day 100 each 12  . glucose blood (TRUE METRIX BLOOD GLUCOSE TEST) test strip Use as instructed two times daily 100 each 12  . hydrochlorothiazide (HYDRODIURIL) 25 MG tablet TAKE 1 TABLET (25 MG TOTAL) BY MOUTH DAILY. 90 tablet 3  . losartan (COZAAR) 50  MG tablet Take 1 tablet (50 mg total) by mouth daily. 90 tablet 4  . Magnesium 500 MG TABS Take by mouth daily.    . metFORMIN (GLUCOPHAGE) 500 MG tablet TAKE 2 TABLETS TWICE DAILY WITH MEALS 360 tablet 3  . minoxidil (ROGAINE) 2 % external solution Apply 1 application topically daily. 60 mL 10  . Omega-3 Fatty Acids (OMEGA 3 PO) Take 750 mg by mouth daily.    Donell Sievert IN Inhale into the lungs.    . TRUEPLUS LANCETS 33G MISC Use as instructed two times daily 100 each 12   No current facility-administered medications on file prior to visit.    Allergies  Allergen Reactions  . Ace Inhibitors     REACTION:  Coughing   Family History  Problem Relation Age of Onset  . Hyperlipidemia Mother   . Hypertension Mother   . Hyperlipidemia Father   . Hypertension Father   . Heart attack Father   . Hyperlipidemia Brother   . Diabetes Maternal Grandmother   . Diabetes Maternal Grandfather   . Colon cancer Neg Hx   . Stomach cancer Neg Hx   . Esophageal cancer Neg Hx   . Rectal cancer Neg Hx     PE: BP 130/70   Pulse 63   Ht 5' 10" (1.778 m)   Wt 223 lb 9.6 oz (101.4 kg)   SpO2 95%   BMI 32.08 kg/m  Wt Readings from Last 3 Encounters:  01/25/18 223 lb 9.6 oz (101.4 kg)  06/19/17 222 lb (100.7 kg)  06/04/17 225 lb (102.1 kg)   Constitutional: overweight, in NAD Eyes: PERRLA, EOMI, no exophthalmos ENT: moist mucous membranes, no thyromegaly, + large nodule palpated in isthmus, no cervical lymphadenopathy Cardiovascular: RRR, No MRG Respiratory: CTA B Gastrointestinal: abdomen soft, NT, ND, BS+ Musculoskeletal: no deformities, strength intact in all 4 Skin: moist, warm, no rashes Neurological: no tremor with outstretched hands, DTR normal in all 4  ASSESSMENT: 1. DM2, non-insulin-dependent, uncontrolled, without long term complications, but with hyperglycemia - h/o DKA  2. Thyroid nodules  3. HL  PLAN:  1. Patient with long-standing, uncontrolled, type 2 diabetes,  with slightly higher hyperglycemic spikes at last visit after eating more snacks in the afternoon and evening.  His higher sugar at that time was more than 300, of the cheesecake.  We discussed the need to reduce snacking and also to limit higher fat foods to be able to reduce his insulin resistance.  I advised him to take 2 glipizide tablets with a larger dinner >> at this visit, he continues with 10 mg glipizide at night.  Otherwise, we did not change his regimen. -At this visit, sugars are slightly higher in the morning >> discussed about improving dinner and especially cutting down snacking after dinner -To improve sugars in the morning, I also advised him to move the entire metformin dose, 2000 mg, with dinner.  I advised him to let me know if he is not tolerating this well, in that case, we can change to metformin extended release formulation. - We will check his annual labs today - I suggested to:  Patient Instructions  Please move all Meformin 2000 mg daily with dinner.  Please continue: - Glipizide 5 mg before b'fast and 10 mg before dinner - Jardiance 25 mg before b'fast  Please return in 4 months with your sugar log.   Please stop at the lab.  - today, HbA1c is 6.6% (higher) - continue checking sugars at different times of the day - check 1x a day, rotating checks - advised for yearly eye exams >> he is UTD - Return to clinic in 3 mo with sugar log   2. Thyroid nodules  -He has a history of 2 thyroid nodules that have been evaluated in the past by FNA in 2014 with benign results -no neck compression symptoms  -I reviewed the report of his most recent thyroid ultrasound that showed stable nodules -No intervention is needed for now  -we will check his  TSH today  3. HL - Reviewed latest lipid panel from 11/2016: LDL at goal, triglycerides high, HDL low - Continues Lipitor, CoQ10, Krill oil without side effects. - recheck Lipids today   Philemon Kingdom, MD PhD  [facility]

## 2018-02-04 DIAGNOSIS — J9610 Chronic respiratory failure, unspecified whether with hypoxia or hypercapnia: Secondary | ICD-10-CM | POA: Diagnosis not present

## 2018-03-07 DIAGNOSIS — J9610 Chronic respiratory failure, unspecified whether with hypoxia or hypercapnia: Secondary | ICD-10-CM | POA: Diagnosis not present

## 2018-04-07 DIAGNOSIS — J9610 Chronic respiratory failure, unspecified whether with hypoxia or hypercapnia: Secondary | ICD-10-CM | POA: Diagnosis not present

## 2018-04-11 DIAGNOSIS — D3131 Benign neoplasm of right choroid: Secondary | ICD-10-CM | POA: Diagnosis not present

## 2018-04-30 ENCOUNTER — Ambulatory Visit (INDEPENDENT_AMBULATORY_CARE_PROVIDER_SITE_OTHER): Payer: Medicare HMO

## 2018-04-30 DIAGNOSIS — Z23 Encounter for immunization: Secondary | ICD-10-CM | POA: Diagnosis not present

## 2018-04-30 NOTE — Progress Notes (Signed)
Per orders of Dr. Hannah Kim, injection of High Dose Flu vaccine given by [facility] Sharnice Bosler. °Patient tolerated injection well. ° °

## 2018-05-07 ENCOUNTER — Ambulatory Visit (INDEPENDENT_AMBULATORY_CARE_PROVIDER_SITE_OTHER): Payer: Medicare HMO

## 2018-05-07 ENCOUNTER — Inpatient Hospital Stay
Admission: RE | Admit: 2018-05-07 | Discharge: 2018-05-07 | Disposition: A | Discharge status: Home / Self Care | Payer: Medicare HMO | Source: Ambulatory Visit | Attending: Family Medicine | Admitting: Family Medicine

## 2018-05-07 ENCOUNTER — Ambulatory Visit (INDEPENDENT_AMBULATORY_CARE_PROVIDER_SITE_OTHER): Payer: Medicare HMO | Admitting: Family Medicine

## 2018-05-07 ENCOUNTER — Encounter: Payer: Self-pay | Admitting: Family Medicine

## 2018-05-07 ENCOUNTER — Other Ambulatory Visit: Payer: Self-pay | Admitting: Family Medicine

## 2018-05-07 VITALS — BP 120/62 | HR 79 | Temp 97.8°F | Ht 70.0 in | Wt 225.7 lb

## 2018-05-07 DIAGNOSIS — R0602 Shortness of breath: Secondary | ICD-10-CM

## 2018-05-07 DIAGNOSIS — E1165 Type 2 diabetes mellitus with hyperglycemia: Secondary | ICD-10-CM | POA: Diagnosis not present

## 2018-05-07 DIAGNOSIS — E785 Hyperlipidemia, unspecified: Secondary | ICD-10-CM | POA: Diagnosis not present

## 2018-05-07 DIAGNOSIS — Z136 Encounter for screening for cardiovascular disorders: Secondary | ICD-10-CM | POA: Diagnosis not present

## 2018-05-07 DIAGNOSIS — N401 Enlarged prostate with lower urinary tract symptoms: Secondary | ICD-10-CM | POA: Diagnosis not present

## 2018-05-07 DIAGNOSIS — Z0000 Encounter for general adult medical examination without abnormal findings: Secondary | ICD-10-CM | POA: Diagnosis not present

## 2018-05-07 DIAGNOSIS — R351 Nocturia: Secondary | ICD-10-CM

## 2018-05-07 DIAGNOSIS — J9610 Chronic respiratory failure, unspecified whether with hypoxia or hypercapnia: Secondary | ICD-10-CM | POA: Diagnosis not present

## 2018-05-07 DIAGNOSIS — I10 Essential (primary) hypertension: Secondary | ICD-10-CM

## 2018-05-07 DIAGNOSIS — J301 Allergic rhinitis due to pollen: Secondary | ICD-10-CM | POA: Diagnosis not present

## 2018-05-07 DIAGNOSIS — R0609 Other forms of dyspnea: Secondary | ICD-10-CM

## 2018-05-07 DIAGNOSIS — R05 Cough: Secondary | ICD-10-CM | POA: Diagnosis not present

## 2018-05-07 LAB — TSH: TSH: 0.74 u[IU]/mL (ref 0.35–4.50)

## 2018-05-07 LAB — CBC WITH DIFFERENTIAL/PLATELET
Basophils Absolute: 0.1 10*3/uL (ref 0.0–0.1)
Basophils Relative: 1 % (ref 0.0–3.0)
EOS PCT: 1.6 % (ref 0.0–5.0)
Eosinophils Absolute: 0.1 10*3/uL (ref 0.0–0.7)
HCT: 44.3 % (ref 39.0–52.0)
HEMOGLOBIN: 14.9 g/dL (ref 13.0–17.0)
Lymphocytes Relative: 24 % (ref 12.0–46.0)
Lymphs Abs: 2 10*3/uL (ref 0.7–4.0)
MCHC: 33.6 g/dL (ref 30.0–36.0)
MCV: 91.9 fl (ref 78.0–100.0)
MONO ABS: 0.7 10*3/uL (ref 0.1–1.0)
MONOS PCT: 8.8 % (ref 3.0–12.0)
Neutro Abs: 5.4 10*3/uL (ref 1.4–7.7)
Neutrophils Relative %: 64.6 % (ref 43.0–77.0)
Platelets: 317 10*3/uL (ref 150.0–400.0)
RBC: 4.82 Mil/uL (ref 4.22–5.81)
RDW: 13 % (ref 11.5–15.5)
WBC: 8.4 10*3/uL (ref 4.0–10.5)

## 2018-05-07 LAB — POCT URINALYSIS DIPSTICK
Bilirubin, UA: NEGATIVE
GLUCOSE UA: POSITIVE — AB
Ketones, UA: NEGATIVE
LEUKOCYTES UA: NEGATIVE
Nitrite, UA: NEGATIVE
PH UA: 5.5 (ref 5.0–8.0)
Protein, UA: NEGATIVE
RBC UA: NEGATIVE
Spec Grav, UA: 1.025 (ref 1.010–1.025)
UROBILINOGEN UA: 0.2 U/dL

## 2018-05-07 LAB — LIPID PANEL
CHOL/HDL RATIO: 4
Cholesterol: 141 mg/dL (ref 0–200)
HDL: 35.2 mg/dL — AB (ref >39.00)
NONHDL: 105.49
Triglycerides: 268 mg/dL — ABNORMAL HIGH (ref 0.0–149.0)
VLDL: 53.6 mg/dL — AB (ref 0.0–40.0)

## 2018-05-07 LAB — HEPATIC FUNCTION PANEL
ALT: 15 U/L (ref 0–53)
AST: 11 U/L (ref 0–37)
Albumin: 4.6 g/dL (ref 3.5–5.2)
Alkaline Phosphatase: 54 U/L (ref 39–117)
BILIRUBIN TOTAL: 0.5 mg/dL (ref 0.2–1.2)
Bilirubin, Direct: 0.1 mg/dL (ref 0.0–0.3)
Total Protein: 7.3 g/dL (ref 6.0–8.3)

## 2018-05-07 LAB — BASIC METABOLIC PANEL
BUN: 21 mg/dL (ref 6–23)
CALCIUM: 10.5 mg/dL (ref 8.4–10.5)
CO2: 28 meq/L (ref 19–32)
CREATININE: 0.77 mg/dL (ref 0.40–1.50)
Chloride: 103 mEq/L (ref 96–112)
GFR: 106.49 mL/min (ref >60.00)
GLUCOSE: 117 mg/dL — AB (ref 70–99)
Potassium: 4.5 mEq/L (ref 3.5–5.1)
Sodium: 139 mEq/L (ref 135–145)

## 2018-05-07 LAB — PSA: PSA: 2.43 ng/mL (ref 0.10–4.00)

## 2018-05-07 LAB — LDL CHOLESTEROL, DIRECT: Direct LDL: 72 mg/dL

## 2018-05-07 LAB — MICROALBUMIN / CREATININE URINE RATIO
Creatinine,U: 61.2 mg/dL
Microalb Creat Ratio: 1.2 mg/g (ref 0.0–30.0)
Microalb, Ur: 0.8 mg/dL (ref 0.0–1.9)

## 2018-05-07 LAB — HEMOGLOBIN A1C: HEMOGLOBIN A1C: 8 % — AB (ref 4.6–6.5)

## 2018-05-07 MED ORDER — HYDROCHLOROTHIAZIDE 25 MG PO TABS: ORAL_TABLET | ORAL | 3 refills | Status: DC

## 2018-05-07 MED ORDER — FLUTICASONE PROPIONATE 50 MCG/ACT NA SUSP: 2.0000 | Freq: Every day | NASAL | 11 refills | Status: DC

## 2018-05-07 MED ORDER — ATORVASTATIN CALCIUM 20 MG PO TABS: 20.0000 mg | ORAL_TABLET | Freq: Every day | ORAL | 4 refills | Status: DC

## 2018-05-07 MED ORDER — LOSARTAN POTASSIUM 50 MG PO TABS: 50.0000 mg | ORAL_TABLET | Freq: Every day | ORAL | 4 refills | Status: DC

## 2018-05-07 NOTE — Progress Notes (Signed)
Robert Cordova is a 67 year old married male non-smoker who still works as a Pharmacist, hospital.... [employer]..... Who comes in today for general physical examination because of the following issues  He has a history of hyperlipidemia for which he takes 20 mg of Lipitor bedtime along with an 81 mg baby aspirin.  He has a history of hypertension.  He takes Cozaar 50 mg daily.  BP today was 120/62.  He also takes hydrochlorothiazide 25 mg daily.  He has a history of type 2 diabetes.  He is currently on Jardiance 25 mg, Glucotrol 5 mg twice daily, metformin 500 mg 2 tabs twice daily.  Last A1c in endocrinology in June 2019 was 6.6.  Blood sugar random 105. He has been seen in the past by pulmonary because of sleep apnea.  He does not use CPAP he uses nighttime oxygen only.  He is noticed for the past 3 months he has had episodes of shortness of breath without chest pain.  He states he will walk about 1 or 2 blocks gets short of breath and then continues he does not have to stop.  He is also noticed shortness of breath and day time climbing stairs or walking up his backyard.  This is a new concern.  We will get a EKG chest x-ray and set him up for a stress test.  He could have silent angina with his underlying diabetes.  Recently saw his eye doctor who told him he had a black dot in his left retina.  He went back for follow-up a couple months later it is not increased in size.  Recommended Dr. Gloriann Loan for dental care.  Colonoscopy 2014 was normal  Vaccinations up-to-date information given on the new shingles vaccine  14 point review of system reviewed otherwise negative except for a lump in his left biceps area.  EKG and chest x-ray were done because of a new concern of shortness of breath along with his long-standing diabete  Social history.........[name] married lives here in [place].  His son Eddie Dibbles who has a lot of mental health issues still lives at home.  He has a son Arnette Norris who  is his Environmental consultant and travels with him.  Hardin Negus married to [place] who has a lot of mental challenges.  They have a 38-year-old child  BP 120/62 (BP Location: Right Arm, Patient Position: Sitting, Cuff Size: Large)   Pulse 79   Temp 97.8 F (36.6 C) (Oral)   Ht 5\' 10"  (1.778 m)   Wt 225 lb 11.2 oz (102.4 kg)   SpO2 97%   BMI 32.38 kg/m  Well-developed well-nourished male no acute distress vital signs stable he is afebrile HEENT were negative the lesion left eye is not visible by me.  Neck was supple thyroid is not enlarged no carotid bruits.  Cardiopulmonary exam normal.  Abdominal exam was normal except for slight ventral hernia.  Genitalia normal circumcised male.  Rectal normal stool guaiac negative prostate smooth nonnodular 1+ BPH.  Extremities normal skin normal peripheral pulses normal except for lesion left biceps which is a small marble sized lipoma  1.  Diabetes at goal.....[name] continue current therapy  2.  Hyperlipidemia.....[name] continue Lipitor and aspirin check labs  3.  Hypertension at goal....... continue current meds  4.  Shortness of breath.... EKG chest x-ray pending... We will set up stress test and cardiology  5.  History of sleep apnea......... continue follow-up by pulmonary  6.  BPH with nocturia x3 secondary to medication

## 2018-05-07 NOTE — Patient Instructions (Signed)
Labs today.......[name] we will call if there is anything abnormal.  Chest x-ray today  We will get you set up in cardiology for a consult for further evaluation of your shortness of breath  Call your insurance company to find out where you get the shingles vaccine  Dr. Gloriann Loan is an excellent dentist  If you do not have an ophthalmologist I would recommend Dr. Bing Plume

## 2018-05-14 ENCOUNTER — Other Ambulatory Visit: Payer: Self-pay | Admitting: Internal Medicine

## 2018-05-17 ENCOUNTER — Encounter: Payer: Self-pay | Admitting: Cardiology

## 2018-05-17 ENCOUNTER — Ambulatory Visit (HOSPITAL_BASED_OUTPATIENT_CLINIC_OR_DEPARTMENT_OTHER)
Admission: RE | Admit: 2018-05-17 | Discharge: 2018-05-17 | Disposition: A | Discharge status: Home / Self Care | Payer: Medicare HMO | Source: Ambulatory Visit | Attending: Cardiology | Admitting: Cardiology

## 2018-05-17 ENCOUNTER — Ambulatory Visit: Payer: Medicare HMO | Admitting: Cardiology

## 2018-05-17 VITALS — BP 118/60 | HR 58 | Ht 70.5 in | Wt 226.0 lb

## 2018-05-17 DIAGNOSIS — I253 Aneurysm of heart: Secondary | ICD-10-CM | POA: Diagnosis not present

## 2018-05-17 DIAGNOSIS — E119 Type 2 diabetes mellitus without complications: Secondary | ICD-10-CM | POA: Diagnosis not present

## 2018-05-17 DIAGNOSIS — E785 Hyperlipidemia, unspecified: Secondary | ICD-10-CM | POA: Diagnosis not present

## 2018-05-17 DIAGNOSIS — I10 Essential (primary) hypertension: Secondary | ICD-10-CM | POA: Insufficient documentation

## 2018-05-17 DIAGNOSIS — R0609 Other forms of dyspnea: Secondary | ICD-10-CM | POA: Diagnosis not present

## 2018-05-17 DIAGNOSIS — E782 Mixed hyperlipidemia: Secondary | ICD-10-CM | POA: Diagnosis not present

## 2018-05-17 DIAGNOSIS — E1165 Type 2 diabetes mellitus with hyperglycemia: Secondary | ICD-10-CM | POA: Diagnosis not present

## 2018-05-17 DIAGNOSIS — I351 Nonrheumatic aortic (valve) insufficiency: Secondary | ICD-10-CM | POA: Diagnosis not present

## 2018-05-17 DIAGNOSIS — G4733 Obstructive sleep apnea (adult) (pediatric): Secondary | ICD-10-CM

## 2018-05-17 NOTE — Patient Instructions (Signed)
Medication Instructions:  Your physician recommends that you continue on your current medications as directed. Please refer to the Current Medication list given to you today.  If you need a refill on your cardiac medications before your next appointment, please call your pharmacy.   Lab work: None.  If you have labs (blood work) drawn today and your tests are completely normal, you will receive your results only by: [name] MyChart Message (if you have MyChart) OR . A paper copy in the mail If you have any lab test that is abnormal or we need to change your treatment, we will call you to review the results.  Testing/Procedures: Your physician has requested that you have an echocardiogram. Echocardiography is a painless test that uses sound waves to create images of your heart. It provides your doctor with information about the size and shape of your heart and how well your heart's chambers and valves are working. This procedure takes approximately one hour. There are no restrictions for this procedure.  Your physician has requested that you have a stress echocardiogram. For further information please visit [email or web address]. Please follow instruction sheet as given.    Follow-Up: At [facility], you and your health needs are our priority.  As part of our continuing mission to provide you with exceptional heart care, we have created designated Provider Care Teams.  These Care Teams include your primary Cardiologist (physician) and Advanced Practice Providers (APPs -  Physician Assistants and Nurse Practitioners) who all work together to provide you with the care you need, when you need it. You will need a follow up appointment in 1 months.  Please call our office 2 months in advance to schedule this appointment.  You may see [name], MD or another member of our [place] Provider Team in [place]: [name], MD . [name], MD  Any Other Special Instructions Will Be  Listed Below (If Applicable).

## 2018-05-17 NOTE — Progress Notes (Signed)
Cardiology Consultation:    Date:  05/17/2018   ID:  Robert Cordova, DOB 29-Aug-1950, MRN [id number]  PCP:  Dorena Cookey, MD  Cardiologist:  [name], MD   Referring MD: Dorena Cookey, MD   Chief Complaint  Patient presents with  . Shortness of Breath  I have some shortness of breath  History of Present Illness:    Robert Cordova is a 67 y.o. male who is being seen today for the evaluation of shortness of breath at the request of Dorena Cookey, MD.  His past medical history significant for long-standing diabetes fairly controlled, hypertension, sleep apnea history of obesity status post lap band surgery.  Also dyslipidemia.  Within last 3 months he noted shortness of breath there is certain things he does in his garden and never had difficulty doing this now would bring shortness of breath also walking upstairs will give him shortness of breath there is no chest pain tightness squeezing pressure burning chest just shortness of breath.  Obviously, he concerned about this as well.  Denies having any swelling of lower extremities no proximal nocturnal dyspnea no dizziness no palpitations no passing out.  Past Medical History:  Diagnosis Date  . ABNORMAL GLUCOSE NEC 03/18/2007  . Arthritis    fingers  . BLEPHARITIS, LEFT 10/29/2009  . DIABETES-TYPE 2 04/18/2010  . Dry throat   . Environmental allergies   . GERD (gastroesophageal reflux disease)   . H/O hiatal hernia   . HYPERLIPIDEMIA 10/22/2007  . HYPERTENSION 03/18/2007  . Multiple thyroid nodules    "biopsy done, everything was fine" being monitored  . NEVUS, MELANOCYTIC, FACE 10/07/2009    Past Surgical History:  Procedure Laterality Date  . BIOPSY THYROID  ~08/2013  . BREATH TEK H PYLORI N/A 09/05/2013   Procedure: BREATH TEK H PYLORI;  Surgeon: [name], MD;  Location: [facility];  Service: General;  Laterality: N/A;  . COLONOSCOPY  2009, 2013   Dr. Sharlett Iles; multiple polyps  . HIATAL  HERNIA REPAIR  11/25/2013   Procedure: LAPAROSCOPIC REPAIR OF HIATAL HERNIA;  Surgeon: [name], MD;  Location: [facility];  Service: General;;  . LAPAROSCOPIC GASTRIC BANDING N/A 11/25/2013   Procedure: LAPAROSCOPIC GASTRIC BANDING;  Surgeon: [name], MD;  Location: [facility];  Service: General;  Laterality: N/A;  . NO PAST SURGERIES      Current Medications: Current Meds  Medication Sig  . aspirin 81 MG tablet Take 1 tablet (81 mg total) by mouth daily.  [name] atorvastatin (LIPITOR) 20 MG tablet Take 1 tablet (20 mg total) by mouth daily.  . Blood Glucose Monitoring Suppl (TRUE METRIX AIR GLUCOSE METER) w/Device KIT 1 Device by Other route as directed.  . Cholecalciferol (VITAMIN D PO) Take 50 mcg by mouth daily.  . Coenzyme Q10 (CO Q-10) 200 MG CAPS Take by mouth daily.  . fluticasone (FLONASE) 50 MCG/ACT nasal spray Place 2 sprays into both nostrils daily.  [name] glipiZIDE (GLUCOTROL) 5 MG tablet TAKE 1 TABLET TWICE DAILY BEFORE MEALS  . glucose blood (ONETOUCH VERIO) test strip Use as instructed x 1 a day  . glucose blood (TRUE METRIX BLOOD GLUCOSE TEST) test strip Use as instructed two times daily  . hydrochlorothiazide (HYDRODIURIL) 25 MG tablet TAKE 1 TABLET (25 MG TOTAL) BY MOUTH DAILY.  [name] JARDIANCE 25 MG TABS tablet TAKE 1 TABLET EVERY DAY  . losartan (COZAAR) 50 MG tablet Take 1 tablet (50 mg total) by mouth daily.  [name]  Magnesium 500 MG TABS Take by mouth daily.  . metFORMIN (GLUCOPHAGE) 500 MG tablet TAKE 2 TABLETS TWICE DAILY WITH MEALS  . minoxidil (ROGAINE) 2 % external solution Apply 1 application topically daily.  Donell Sievert IN Inhale into the lungs.  . TRUEPLUS LANCETS 33G MISC Use as instructed two times daily     Allergies:   Ace inhibitors   Social History   Socioeconomic History  . Marital status: Married    Spouse name: Not on file  . Number of children: Not on file  . Years of education: Not on file  . Highest education level: Not on file    Occupational History  . Occupation: Optometrist-- self-employeed  Social Needs  . Financial resource strain: Not on file  . Food insecurity:    Worry: Not on file    Inability: Not on file  . Transportation needs:    Medical: Not on file    Non-medical: Not on file  Tobacco Use  . Smoking status: Never Smoker  . Smokeless tobacco: Never Used  Substance and Sexual Activity  . Alcohol use: No  . Drug use: No  . Sexual activity: Not on file  Lifestyle  . Physical activity:    Days per week: Not on file    Minutes per session: Not on file  . Stress: Not on file  Relationships  . Social connections:    Talks on phone: Not on file    Gets together: Not on file    Attends religious service: Not on file    Active member of club or organization: Not on file    Attends meetings of clubs or organizations: Not on file    Relationship status: Not on file  Other Topics Concern  . Not on file  Social History Narrative  . Not on file     Family History: The patient's family history includes Diabetes in his maternal grandfather and maternal grandmother; Heart attack in his father; Hyperlipidemia in his brother, father, and mother; Hypertension in his father and mother. There is no history of Colon cancer, Stomach cancer, Esophageal cancer, or Rectal cancer. ROS:   Please see the history of present illness.    All 14 point review of systems negative except as described per history of present illness.  EKGs/Labs/Other Studies Reviewed:    The following studies were reviewed today: EKG showed normal sinus rhythm normal P interval normal QS consideration morphology no ST segment changes  Recent Labs: 05/07/2018: ALT 15; BUN 21; Creatinine, Ser 0.77; Hemoglobin 14.9; Platelets 317.0; Potassium 4.5; Sodium 139; TSH 0.74  Recent Lipid Panel    Component Value Date/Time   CHOL 141 05/07/2018 1225   TRIG 268.0 (H) 05/07/2018 1225   TRIG 150 (H) 07/13/2006 1030   HDL 35.20 (L) 05/07/2018  1225   CHOLHDL 4 05/07/2018 1225   VLDL 53.6 (H) 05/07/2018 1225   LDLCALC 51 01/25/2018 1033   LDLDIRECT 72.0 05/07/2018 1225    Physical Exam:    VS:  BP 118/60   Pulse (!) 58   Ht 5' 10.5" (1.791 m)   Wt 226 lb (102.5 kg)   SpO2 98%   BMI 31.97 kg/m     Wt Readings from Last 3 Encounters:  05/17/18 226 lb (102.5 kg)  05/07/18 225 lb 11.2 oz (102.4 kg)  01/25/18 223 lb 9.6 oz (101.4 kg)     GEN:  Well nourished, well developed in no acute distress HEENT: Normal NECK: No JVD; No carotid  bruits LYMPHATICS: No lymphadenopathy CARDIAC: RRR, no murmurs, no rubs, no gallops RESPIRATORY:  Clear to auscultation without rales, wheezing or rhonchi  ABDOMEN: Soft, non-tender, non-distended MUSCULOSKELETAL:  No edema; No deformity  SKIN: Warm and dry NEUROLOGIC:  Alert and oriented x 3 PSYCHIATRIC:  Normal affect   ASSESSMENT:    1. Dyspnea on exertion   2. Essential hypertension   3. OSA (obstructive sleep apnea)   4. Type 2 diabetes mellitus with hyperglycemia, without long-term current use of insulin (HCC)   5. Mixed hyperlipidemia    PLAN:    In order of problems listed above:  1. Dyspnea on exertion which is unexplained.  He does have multiple risk factors for coronary artery disease.  I will ask him to have echocardiogram to assess left ventricular ejection fraction.  Because of risk factors for coronary artery disease as well as long-standing diabetes I need to rule out possibility of ischemia equivalent.  Therefore he will be scheduled to have stress echocardiogram.  He is already on aspirin as well as Lipitor which I will continue.  I asked him not to exert himself to the degree that he is getting short of breath until we do stress test. 2. Essential hypertension blood pressure appears to be well controlled continue present management. 3. Obstructive sleep apnea that was diagnosed before gastric lap surgery.  Does not use any mask now. 4. Type 2 diabetes with  hemoglobin A1c 8.  He understands a problem try to work to improve it. 5. Dyslipidemia on statin which is high intensity which is excellent I will continue.  Last cholesterol is at target.   Medication Adjustments/Labs and Tests Ordered: Current medicines are reviewed at length with the patient today.  Concerns regarding medicines are outlined above.  No orders of the defined types were placed in this encounter.  No orders of the defined types were placed in this encounter.   Signed, Park Liter, MD, [facility]. 05/17/2018 10:58 AM    [place]

## 2018-05-17 NOTE — Progress Notes (Signed)
  Echocardiogram 2D Echocardiogram has been performed.  Tel Hevia T Robert Cordova 05/17/2018, 2:47 PM

## 2018-05-18 LAB — ECHOCARDIOGRAM COMPLETE
Height: 70.5 in
WEIGHTICAEL: 3616 [oz_av]

## 2018-05-22 ENCOUNTER — Telehealth: Payer: Self-pay | Admitting: Emergency Medicine

## 2018-05-22 NOTE — Telephone Encounter (Signed)
Left message for patient to return call regarding lab results.  

## 2018-05-23 ENCOUNTER — Other Ambulatory Visit: Payer: Self-pay | Admitting: *Deleted

## 2018-05-23 ENCOUNTER — Ambulatory Visit (HOSPITAL_BASED_OUTPATIENT_CLINIC_OR_DEPARTMENT_OTHER)
Admission: RE | Admit: 2018-05-23 | Discharge: 2018-05-23 | Disposition: A | Discharge status: Home / Self Care | Payer: Medicare HMO | Source: Ambulatory Visit | Attending: Cardiology | Admitting: Cardiology

## 2018-05-23 DIAGNOSIS — E119 Type 2 diabetes mellitus without complications: Secondary | ICD-10-CM | POA: Diagnosis not present

## 2018-05-23 DIAGNOSIS — R0609 Other forms of dyspnea: Secondary | ICD-10-CM | POA: Diagnosis not present

## 2018-05-23 DIAGNOSIS — I119 Hypertensive heart disease without heart failure: Secondary | ICD-10-CM | POA: Diagnosis not present

## 2018-05-23 DIAGNOSIS — E785 Hyperlipidemia, unspecified: Secondary | ICD-10-CM | POA: Diagnosis not present

## 2018-05-23 NOTE — Progress Notes (Signed)
  Echocardiogram Echocardiogram Stress Test has been performed.  Pasty Manninen T Jimmy Plessinger 05/23/2018, 3:07 PM

## 2018-05-24 LAB — PRO B NATRIURETIC PEPTIDE: NT-PRO BNP: 16 pg/mL (ref 0–376)

## 2018-05-24 NOTE — Telephone Encounter (Signed)
Patient informed of echocardiogram results.  

## 2018-05-31 ENCOUNTER — Encounter: Payer: Self-pay | Admitting: Internal Medicine

## 2018-05-31 ENCOUNTER — Ambulatory Visit: Payer: Medicare HMO | Admitting: Internal Medicine

## 2018-05-31 VITALS — BP 138/80 | HR 66 | Ht 70.5 in | Wt 227.0 lb

## 2018-05-31 DIAGNOSIS — E049 Nontoxic goiter, unspecified: Secondary | ICD-10-CM

## 2018-05-31 DIAGNOSIS — E1165 Type 2 diabetes mellitus with hyperglycemia: Secondary | ICD-10-CM | POA: Diagnosis not present

## 2018-05-31 DIAGNOSIS — E042 Nontoxic multinodular goiter: Secondary | ICD-10-CM | POA: Diagnosis not present

## 2018-05-31 DIAGNOSIS — E782 Mixed hyperlipidemia: Secondary | ICD-10-CM

## 2018-05-31 NOTE — Progress Notes (Signed)
Patient ID: Robert Cordova, male   DOB: [birth year], 67 y.o.   MRN: [id number]   HPI: Robert Cordova is a 67 y.o.-year-old male, returning for f/u for DM2, dx in 2013, non-insulin-dependent, uncontrolled, with complications (h/o DKA). Last visit 4 months ago.  Since last visit, he relaxes diet and including more snacks especially after dinner.  Sugars are higher.  Last hemoglobin A1c was: Lab Results  Component Value Date   HGBA1C 8.0 (H) 05/07/2018   HGBA1C 6.6 (A) 01/25/2018   HGBA1C 6.3 06/19/2017  08/22/2016: 7.7%  He is on: - Metformin 1000 mg 2x a day, with meals >> 2000 mg with dinner - Glipizide 5 mg before b'fast and 5 >> 10 mg before dinner - Jardiance 25 mg before b'fast Of note, He was off and on insulin in the past. He came off when he lost weight after his lab band surgery in 2014 (45 pounds)  He is checking sugars 1-2 times a day: - am: 102-147, 168 >> 127-168 >> 115-179, 213 >> 118-191, 220 - 2h after b'fast:108 >> n/c >> 87-125, 147 >> n/c - before lunch: 100 >> 71-108, 178 >> 74-132 >> 108-151 - 2h after lunch: 150 >> n/c >> 100, 153 >> n/c >> 118-215 - before dinner:131 >> n/c >> 90-124 >>  - 2h after dinner:328 (cheese cake) >> n/c - bedtime:  101- (130s)-299 >> 79-153, 201, 253 >> 121-313 - nighttime: n/c >> 212 >> n/c Lowest sugar was 101 >> 79 >> 108; he has hypoglycemia awareness in the 70s. Highest sugar was 328 >> 213 >> 313.  Pt's meals are: - Breakfast: Protein shake - Lunch: Eggs, bacon or leftovers - Dinner: Meat + veggies + starch, pizza, [place] food - Snacks: In the evening: cookies, etc.  -No CKD, last BUN/creatinine:  Lab Results  Component Value Date   BUN 21 05/07/2018   BUN 19 01/25/2018   CREATININE 0.77 05/07/2018   CREATININE 0.78 01/25/2018  On losartan 25 mg 2X a day. -+ HL; last set of lipids: Lab Results  Component Value Date   CHOL 141 05/07/2018   HDL 35.20 (L) 05/07/2018   LDLCALC 51 01/25/2018   LDLDIRECT  72.0 05/07/2018   TRIG 268.0 (H) 05/07/2018   CHOLHDL 4 05/07/2018  On Lipitor, co-Q10, Krill oil.. - last eye exam was on 01/2018: No DR, + small cataracts.[facility]. -Denies numbness and tingling in his feet.  He has a history of thyroid nodules-S/P 2 benign FNAs in 2014.  Latest TSH normal: Lab Results  Component Value Date   TSH 0.74 05/07/2018   Reviewed latest thyroid ultrasound report: Thyroid U/S (10/20/2016): Stable 2.8 cm solid nodule in the inferior right lobe.  This was previously sampled with negative biopsy.  The 1.8 cm nodule previously sampled in the right isthmus was no longer identified by ultrasound. He also sleep-related hypoxia, HTN.  He has Medicare with 2 supplemental insurances.  ROS: Constitutional: no weight gain/no weight loss, no fatigue, no subjective hyperthermia, no subjective hypothermia Eyes: no blurry vision, no xerophthalmia ENT: no sore throat, no nodules palpated in neck, no dysphagia, no odynophagia, no hoarseness Cardiovascular: no CP/no SOB/no palpitations/no leg swelling Respiratory: no cough/no SOB/no wheezing Gastrointestinal: no N/no V/no D/no C/no acid reflux Musculoskeletal: no muscle aches/+ joint aches Skin: no rashes, no hair loss Neurological: + tremors/no numbness/no tingling/no dizziness  I reviewed pt's medications, allergies, PMH, social hx, family hx, and changes were documented in the history of present illness. Otherwise, unchanged from  my initial visit note.  Past Medical History:  Diagnosis Date  . ABNORMAL GLUCOSE NEC 03/18/2007  . Arthritis    fingers  . BLEPHARITIS, LEFT 10/29/2009  . DIABETES-TYPE 2 04/18/2010  . Dry throat   . Environmental allergies   . GERD (gastroesophageal reflux disease)   . H/O hiatal hernia   . HYPERLIPIDEMIA 10/22/2007  . HYPERTENSION 03/18/2007  . Multiple thyroid nodules    "biopsy done, everything was fine" being monitored  . NEVUS, MELANOCYTIC, FACE 10/07/2009   Past  Surgical History:  Procedure Laterality Date  . BIOPSY THYROID  ~08/2013  . BREATH TEK H PYLORI N/A 09/05/2013   Procedure: BREATH TEK H PYLORI;  Surgeon: [name], MD;  Location: [facility];  Service: General;  Laterality: N/A;  . COLONOSCOPY  2009, 2013   Dr. Sharlett Iles; multiple polyps  . HIATAL HERNIA REPAIR  11/25/2013   Procedure: LAPAROSCOPIC REPAIR OF HIATAL HERNIA;  Surgeon: [name], MD;  Location: [facility];  Service: General;;  . LAPAROSCOPIC GASTRIC BANDING N/A 11/25/2013   Procedure: LAPAROSCOPIC GASTRIC BANDING;  Surgeon: [name], MD;  Location: [facility];  Service: General;  Laterality: N/A;  . NO PAST SURGERIES     Social History   Social History  . Marital status: Married    Spouse name: N/A  . Number of children: 2   Occupational History  . 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 Smoking status: Never Smoker  . Smokeless tobacco: Never Used  . Alcohol use No  . Drug use: No   Current Outpatient Medications on File Prior to Visit  Medication Sig Dispense Refill  . aspirin 81 MG tablet Take 1 tablet (81 mg total) by mouth daily. 30 tablet 5  . atorvastatin (LIPITOR) 20 MG tablet Take 1 tablet (20 mg total) by mouth daily. 90 tablet 4  . Blood Glucose Monitoring Suppl (TRUE METRIX AIR GLUCOSE METER) w/Device KIT 1 Device by Other route as directed. 1 kit 0  . Cholecalciferol (VITAMIN D PO) Take 50 mcg by mouth daily.    . Coenzyme Q10 (CO Q-10) 200 MG CAPS Take by mouth daily.    . fluticasone (FLONASE) 50 MCG/ACT nasal spray Place 2 sprays into both nostrils daily. 48 g 11  . glipiZIDE (GLUCOTROL) 5 MG tablet TAKE 1 TABLET TWICE DAILY BEFORE MEALS 180 tablet 3  . glucose blood (ONETOUCH VERIO) test strip Use as instructed x 1 a day 100 each 12  . glucose blood (TRUE METRIX BLOOD GLUCOSE TEST) test strip Use as instructed two times daily 100 each 12  . hydrochlorothiazide (HYDRODIURIL) 25 MG tablet TAKE 1 TABLET (25  MG TOTAL) BY MOUTH DAILY. 90 tablet 3  . JARDIANCE 25 MG TABS tablet TAKE 1 TABLET EVERY DAY 90 tablet 1  . losartan (COZAAR) 50 MG tablet Take 1 tablet (50 mg total) by mouth daily. 90 tablet 4  . Magnesium 500 MG TABS Take by mouth daily.    . metFORMIN (GLUCOPHAGE) 500 MG tablet TAKE 2 TABLETS TWICE DAILY WITH MEALS 360 tablet 3  . minoxidil (ROGAINE) 2 % external solution Apply 1 application topically daily. 60 mL 10  . OXYGEN-HELIUM IN Inhale into the lungs.    . TRUEPLUS LANCETS 33G MISC Use as instructed two times daily 100 each 12   No current facility-administered medications on file prior to visit.    Allergies  Allergen Reactions  . Ace Inhibitors     REACTION:  Coughing   Family History  Problem Relation Age of Onset  . Hyperlipidemia Mother   . Hypertension Mother   . Hyperlipidemia Father   . Hypertension Father   . Heart attack Father   . Hyperlipidemia Brother   . Diabetes Maternal Grandmother   . Diabetes Maternal Grandfather   . Colon cancer Neg Hx   . Stomach cancer Neg Hx   . Esophageal cancer Neg Hx   . Rectal cancer Neg Hx     PE: BP 138/80   Pulse 66   Ht 5' 10.5" (1.791 m)   Wt 227 lb (103 kg)   SpO2 97%   BMI 32.11 kg/m  Wt Readings from Last 3 Encounters:  05/31/18 227 lb (103 kg)  05/17/18 226 lb (102.5 kg)  05/07/18 225 lb 11.2 oz (102.4 kg)   Constitutional: overweight, in NAD Eyes: PERRLA, EOMI, no exophthalmos ENT: moist mucous membranes, no thyromegaly, + large thyroid nodule palpated in isthmus, no cervical lymphadenopathy Cardiovascular: RRR, No MRG Respiratory: CTA B Gastrointestinal: abdomen soft, NT, ND, BS+ Musculoskeletal: no deformities, strength intact in all 4 Skin: moist, warm, no rashes Neurological: + tremor with outstretched hands (essential0, DTR normal in all 4  ASSESSMENT: 1. DM2, non-insulin-dependent, uncontrolled, without long term complications, but with hyperglycemia - h/o DKA  2. Thyroid nodules  3.   Goiter  4. HL  PLAN:  1. Patient with long-standing, uncontrolled, type 2 diabetes, on oral medication only.  Sugars were slightly higher in the morning at last visit and we discussed about improving dinner and especially cutting down snacking after dinner.  At that time, to improve the sugars in the morning, I advised him to move the entire metformin dose at night, with dinner (2000 mg).  He did so and he is tolerating this dose well. - His most recent HbA1c (from 3 weeks ago) was higher than before, at 8.0%, increased from 6.6% - He describes that since last visit he started to eat more snacks, especially at night, even though he is not hungry.  We discussed that this is most likely the reason for his increased blood sugars and that it is paramount that he stops eating after dinner.  He agrees to try to do so.  I advised him to try this for 1 month and to let me know if sugars do not improve after improving diet.  In that case, we may need to add a GLP-1 receptor agonist.  I would like to avoid insulin if possible. - I suggested to:  Patient Instructions  Please continue: - Metformin 2000 mg with dinner - Glipizide 5 mg before b'fast and 10 mg before dinner - Jardiance 25 mg before b'fast  Please return in 4 months with your sugar log.   - continue checking sugars at different times of the day - check 1x a day, rotating checks - advised for yearly eye exams >> he is UTD - Return to clinic in 3-4 mo with sugar log   2. Thyroid nodules and goiter -Patient has a history of 2 thyroid nodules that have been evaluated in the past by FNA in 2014 with benign results -Most recent ultrasound showed stable nodules -Recent TSH was normal -No neck compression symptoms -No intervention needed for now.  3. HL - Reviewed latest lipid panel from 05/2018: LDL at goal, slightly higher than before, triglycerides high, HDL low Lab Results  Component Value Date   CHOL 141 05/07/2018   HDL 35.20 (L)  05/07/2018  LDLCALC 51 01/25/2018   LDLDIRECT 72.0 05/07/2018   TRIG 268.0 (H) 05/07/2018   CHOLHDL 4 05/07/2018  - Continues Lipitor, co-Q10, Krill oil without side effects.  Philemon Kingdom, MD PhD [facility] Endocrinology

## 2018-05-31 NOTE — Patient Instructions (Signed)
Please continue: - Metformin 2000 mg with dinner - Glipizide 5 mg before b'fast and 10 mg before dinner - Jardiance 25 mg before b'fast  Please return in 3-4 months with your sugar log.

## 2018-06-07 DIAGNOSIS — J9610 Chronic respiratory failure, unspecified whether with hypoxia or hypercapnia: Secondary | ICD-10-CM | POA: Diagnosis not present

## 2018-06-18 ENCOUNTER — Ambulatory Visit (INDEPENDENT_AMBULATORY_CARE_PROVIDER_SITE_OTHER): Payer: Medicare HMO

## 2018-06-18 ENCOUNTER — Other Ambulatory Visit: Payer: Self-pay

## 2018-06-18 DIAGNOSIS — R0602 Shortness of breath: Secondary | ICD-10-CM | POA: Diagnosis not present

## 2018-06-27 ENCOUNTER — Telehealth: Payer: Self-pay

## 2018-06-28 ENCOUNTER — Telehealth: Payer: Self-pay | Admitting: Family Medicine

## 2018-06-28 ENCOUNTER — Encounter: Payer: Self-pay | Admitting: Family Medicine

## 2018-06-28 NOTE — Telephone Encounter (Signed)
Can you advise. Thanks

## 2018-06-28 NOTE — Telephone Encounter (Signed)
Copied from [place] [id number]. Topic: General - Other >> Jun 27, 2018  3:16 PM Janace Aris A wrote: Reason for CRM: Pt called in wanting to know if someone can call him back in regards to his imaging that was done on 06/18/18. He says he has not received the results yet. >> Jun 28, 2018 12:05 PM Bea Graff, NT wrote: Pt calling to check status on receiving his imaging results from last week.

## 2018-07-01 ENCOUNTER — Ambulatory Visit (INDEPENDENT_AMBULATORY_CARE_PROVIDER_SITE_OTHER): Payer: Medicare HMO

## 2018-07-01 ENCOUNTER — Other Ambulatory Visit: Payer: Self-pay

## 2018-07-01 DIAGNOSIS — R0602 Shortness of breath: Secondary | ICD-10-CM | POA: Diagnosis not present

## 2018-07-01 NOTE — Telephone Encounter (Signed)
Results are not in Epic.  You will need to call and have them added so Dr. Orion Modest physician can interpret.

## 2018-07-02 ENCOUNTER — Encounter: Payer: Self-pay | Admitting: Cardiology

## 2018-07-02 ENCOUNTER — Ambulatory Visit: Payer: Medicare HMO | Admitting: Cardiology

## 2018-07-02 VITALS — BP 120/62 | HR 75 | Ht 70.5 in | Wt 224.0 lb

## 2018-07-02 DIAGNOSIS — I10 Essential (primary) hypertension: Secondary | ICD-10-CM | POA: Diagnosis not present

## 2018-07-02 DIAGNOSIS — R0609 Other forms of dyspnea: Secondary | ICD-10-CM | POA: Diagnosis not present

## 2018-07-02 DIAGNOSIS — E1165 Type 2 diabetes mellitus with hyperglycemia: Secondary | ICD-10-CM

## 2018-07-02 NOTE — Patient Instructions (Signed)
Medication Instructions:  Your physician recommends that you continue on your current medications as directed. Please refer to the Current Medication list given to you today.  If you need a refill on your cardiac medications before your next appointment, please call your pharmacy.   Lab work: None.  If you have labs (blood work) drawn today and your tests are completely normal, you will receive your results only by: [name] MyChart Message (if you have MyChart) OR . A paper copy in the mail If you have any lab test that is abnormal or we need to change your treatment, we will call you to review the results.  Testing/Procedures: None.   Follow-Up: At [facility], you and your health needs are our priority.  As part of our continuing mission to provide you with exceptional heart care, we have created designated Provider Care Teams.  These Care Teams include your primary Cardiologist (physician) and Advanced Practice Providers (APPs -  Physician Assistants and Nurse Practitioners) who all work together to provide you with the care you need, when you need it. You will need a follow up appointment in 5 months.  Please call our office 2 months in advance to schedule this appointment.  You may see [name], MD or another member of our [place] Provider Team in [place]: [name], MD . [name], MD  Any Other Special Instructions Will Be Listed Below (If Applicable).

## 2018-07-02 NOTE — Progress Notes (Signed)
Cardiology Office Note:    Date:  07/02/2018   ID:  Robert Cordova, DOB 02-Apr-1951, MRN [id number]  PCP:  Dorena Cookey, MD  Cardiologist:  [name], MD    Referring MD: Dorena Cookey, MD   Chief Complaint  Patient presents with  . 1 month follow up  Doing fine  History of Present Illness:    Robert Cordova is a 67 y.o. male who was referred to me originally because of dyspnea on exertion we did echocardiogram to check for left ventricular ejection fraction which was normal stress test was also done she works 7-1/2 minutes on the treadmill did not develop any chest pain overall stress test is negative I think we can conclude at least for now we do not have any critical problem with his heart I suspect the reason for shortness of breath is lack of exercise is and we spent a great of time today talking about way to start exercising on the regular basis  Past Medical History:  Diagnosis Date  . ABNORMAL GLUCOSE NEC 03/18/2007  . Arthritis    fingers  . BLEPHARITIS, LEFT 10/29/2009  . DIABETES-TYPE 2 04/18/2010  . Dry throat   . Environmental allergies   . GERD (gastroesophageal reflux disease)   . H/O hiatal hernia   . HYPERLIPIDEMIA 10/22/2007  . HYPERTENSION 03/18/2007  . Multiple thyroid nodules    "biopsy done, everything was fine" being monitored  . NEVUS, MELANOCYTIC, FACE 10/07/2009    Past Surgical History:  Procedure Laterality Date  . BIOPSY THYROID  ~08/2013  . BREATH TEK H PYLORI N/A 09/05/2013   Procedure: BREATH TEK H PYLORI;  Surgeon: [name], MD;  Location: [facility];  Service: General;  Laterality: N/A;  . COLONOSCOPY  2009, 2013   Dr. Sharlett Iles; multiple polyps  . HIATAL HERNIA REPAIR  11/25/2013   Procedure: LAPAROSCOPIC REPAIR OF HIATAL HERNIA;  Surgeon: [name], MD;  Location: [facility];  Service: General;;  . LAPAROSCOPIC GASTRIC BANDING N/A 11/25/2013   Procedure: LAPAROSCOPIC GASTRIC BANDING;  Surgeon: [name], MD;  Location: [facility];  Service: General;  Laterality: N/A;  . NO PAST SURGERIES      Current Medications: Current Meds  Medication Sig  . aspirin 81 MG tablet Take 1 tablet (81 mg total) by mouth daily.  [name] atorvastatin (LIPITOR) 20 MG tablet Take 1 tablet (20 mg total) by mouth daily.  . Blood Glucose Monitoring Suppl (TRUE METRIX AIR GLUCOSE METER) w/Device KIT 1 Device by Other route as directed.  . Cholecalciferol (VITAMIN D PO) Take 50 mcg by mouth daily.  . Coenzyme Q10 (CO Q-10) 200 MG CAPS Take by mouth daily.  . fluticasone (FLONASE) 50 MCG/ACT nasal spray Place 2 sprays into both nostrils daily.  [name] glipiZIDE (GLUCOTROL) 5 MG tablet TAKE 1 TABLET TWICE DAILY BEFORE MEALS  . glucose blood (ONETOUCH VERIO) test strip Use as instructed x 1 a day  . glucose blood (TRUE METRIX BLOOD GLUCOSE TEST) test strip Use as instructed two times daily  . JARDIANCE 25 MG TABS tablet TAKE 1 TABLET EVERY DAY  . losartan (COZAAR) 50 MG tablet Take 1 tablet (50 mg total) by mouth daily.  . Magnesium 250 MG TABS Take by mouth daily.   . metFORMIN (GLUCOPHAGE) 500 MG tablet TAKE 2 TABLETS TWICE DAILY WITH MEALS  . minoxidil (ROGAINE) 2 % external solution Apply 1 application topically daily.  Donell Sievert IN Inhale into the lungs.  Karen Chafe  LANCETS 33G MISC Use as instructed two times daily     Allergies:   Ace inhibitors   Social History   Socioeconomic History  . Marital status: Married    Spouse name: Not on file  . Number of children: Not on file  . Years of education: Not on file  . Highest education level: Not on file  Occupational History  . Occupation: Optometrist-- self-employeed  Social Needs  . Financial resource strain: Not on file  . Food insecurity:    Worry: Not on file    Inability: Not on file  . Transportation needs:    Medical: Not on file    Non-medical: Not on file  Tobacco Use  . Smoking status: Never Smoker  . Smokeless tobacco: Never Used    Substance and Sexual Activity  . Alcohol use: No  . Drug use: No  . Sexual activity: Not on file  Lifestyle  . Physical activity:    Days per week: Not on file    Minutes per session: Not on file  . Stress: Not on file  Relationships  . Social connections:    Talks on phone: Not on file    Gets together: Not on file    Attends religious service: Not on file    Active member of club or organization: Not on file    Attends meetings of clubs or organizations: Not on file    Relationship status: Not on file  Other Topics Concern  . Not on file  Social History Narrative  . Not on file     Family History: The patient's family history includes Diabetes in his maternal grandfather and maternal grandmother; Heart attack in his father; Hyperlipidemia in his brother, father, and mother; Hypertension in his father and mother. There is no history of Colon cancer, Stomach cancer, Esophageal cancer, or Rectal cancer. ROS:   Please see the history of present illness.    All 14 point review of systems negative except as described per history of present illness  EKGs/Labs/Other Studies Reviewed:      Recent Labs: 05/07/2018: ALT 15; BUN 21; Creatinine, Ser 0.77; Hemoglobin 14.9; Platelets 317.0; Potassium 4.5; Sodium 139; TSH 0.74 05/23/2018: NT-Pro BNP 16  Recent Lipid Panel    Component Value Date/Time   CHOL 141 05/07/2018 1225   TRIG 268.0 (H) 05/07/2018 1225   TRIG 150 (H) 07/13/2006 1030   HDL 35.20 (L) 05/07/2018 1225   CHOLHDL 4 05/07/2018 1225   VLDL 53.6 (H) 05/07/2018 1225   LDLCALC 51 01/25/2018 1033   LDLDIRECT 72.0 05/07/2018 1225    Physical Exam:    VS:  BP 120/62   Pulse 75   Ht 5' 10.5" (1.791 m)   Wt 224 lb (101.6 kg)   SpO2 95%   BMI 31.69 kg/m     Wt Readings from Last 3 Encounters:  07/02/18 224 lb (101.6 kg)  05/31/18 227 lb (103 kg)  05/17/18 226 lb (102.5 kg)     GEN:  Well nourished, well developed in no acute distress HEENT: Normal NECK: No  JVD; No carotid bruits LYMPHATICS: No lymphadenopathy CARDIAC: RRR, no murmurs, no rubs, no gallops RESPIRATORY:  Clear to auscultation without rales, wheezing or rhonchi  ABDOMEN: Soft, non-tender, non-distended MUSCULOSKELETAL:  No edema; No deformity  SKIN: Warm and dry LOWER EXTREMITIES: no swelling NEUROLOGIC:  Alert and oriented x 3 PSYCHIATRIC:  Normal affect   ASSESSMENT:    1. Essential hypertension   2. Type 2 diabetes  mellitus with hyperglycemia, without long-term current use of insulin ([place])   3. Dyspnea on exertion    PLAN:    In order of problems listed above:  1. Essential hypertension blood pressure well controlled continue present management. 2. Dyspnea on exertion testing so far negative we talked about needs to exercise on the regular basis which he promised to do 3. That the diabetes followed by internal medicine team. 4. Dyslipidemia on statin which I will continue. 5. Diabetes followed by internal medicine team.  Stable.   Medication Adjustments/Labs and Tests Ordered: Current medicines are reviewed at length with the patient today.  Concerns regarding medicines are outlined above.  No orders of the defined types were placed in this encounter.  Medication changes: No orders of the defined types were placed in this encounter.   Signed, Park Liter, MD, [facility] 07/02/2018 10:55 AM    [place]

## 2018-07-03 NOTE — Telephone Encounter (Signed)
Pt notified of Chest results and verbalized understanding. Orders read by Dr.Banks.

## 2018-07-07 DIAGNOSIS — J9610 Chronic respiratory failure, unspecified whether with hypoxia or hypercapnia: Secondary | ICD-10-CM | POA: Diagnosis not present

## 2018-08-05 ENCOUNTER — Other Ambulatory Visit: Payer: Self-pay

## 2018-08-05 MED ORDER — GLIPIZIDE 5 MG PO TABS: ORAL_TABLET | ORAL | 3 refills | Status: DC

## 2018-08-05 MED ORDER — EMPAGLIFLOZIN 25 MG PO TABS: 25.0000 mg | ORAL_TABLET | Freq: Every day | ORAL | 1 refills | Status: DC

## 2018-08-05 MED ORDER — METFORMIN HCL 500 MG PO TABS: ORAL_TABLET | ORAL | 3 refills | Status: DC

## 2018-08-07 DIAGNOSIS — J9610 Chronic respiratory failure, unspecified whether with hypoxia or hypercapnia: Secondary | ICD-10-CM | POA: Diagnosis not present

## 2018-08-12 ENCOUNTER — Ambulatory Visit (INDEPENDENT_AMBULATORY_CARE_PROVIDER_SITE_OTHER): Payer: Medicare HMO | Admitting: Family Medicine

## 2018-08-12 ENCOUNTER — Encounter: Payer: Self-pay | Admitting: Family Medicine

## 2018-08-12 VITALS — BP 118/66 | Temp 98.0°F | Ht 70.0 in | Wt 224.0 lb

## 2018-08-12 DIAGNOSIS — G4734 Idiopathic sleep related nonobstructive alveolar hypoventilation: Secondary | ICD-10-CM | POA: Diagnosis not present

## 2018-08-12 DIAGNOSIS — I10 Essential (primary) hypertension: Secondary | ICD-10-CM | POA: Diagnosis not present

## 2018-08-12 DIAGNOSIS — E1165 Type 2 diabetes mellitus with hyperglycemia: Secondary | ICD-10-CM | POA: Diagnosis not present

## 2018-08-12 DIAGNOSIS — R229 Localized swelling, mass and lump, unspecified: Secondary | ICD-10-CM

## 2018-08-12 NOTE — Patient Instructions (Signed)
Managing Your Hypertension Hypertension is commonly called high blood pressure. This is when the force of your blood pressing against the walls of your arteries is too strong. Arteries are blood vessels that carry blood from your heart throughout your body. Hypertension forces the heart to work harder to pump blood, and may cause the arteries to become narrow or stiff. Having untreated or uncontrolled hypertension can cause heart attack, stroke, kidney disease, and other problems. What are blood pressure readings? A blood pressure reading consists of a higher number over a lower number. Ideally, your blood pressure should be below 120/80. The first ("top") number is called the systolic pressure. It is a measure of the pressure in your arteries as your heart beats. The second ("bottom") number is called the diastolic pressure. It is a measure of the pressure in your arteries as the heart relaxes. What does my blood pressure reading mean? Blood pressure is classified into four stages. Based on your blood pressure reading, your health care provider may use the following stages to determine what type of treatment you need, if any. Systolic pressure and diastolic pressure are measured in a unit called mm Hg. Normal  Systolic pressure: below 120.  Diastolic pressure: below 80. Elevated  Systolic pressure: 120-129.  Diastolic pressure: below 80. Hypertension stage 1  Systolic pressure: 130-139.  Diastolic pressure: 80-89. Hypertension stage 2  Systolic pressure: 140 or above.  Diastolic pressure: 90 or above. What health risks are associated with hypertension? Managing your hypertension is an important responsibility. Uncontrolled hypertension can lead to:  A heart attack.  A stroke.  A weakened blood vessel (aneurysm).  Heart failure.  Kidney damage.  Eye damage.  Metabolic syndrome.  Memory and concentration problems. What changes can I make to manage my  hypertension? Hypertension can be managed by making lifestyle changes and possibly by taking medicines. Your health care provider will help you make a plan to bring your blood pressure within a normal range. Eating and drinking   Eat a diet that is high in fiber and potassium, and low in salt (sodium), added sugar, and fat. An example eating plan is called the DASH (Dietary Approaches to Stop Hypertension) diet. To eat this way: ? Eat plenty of fresh fruits and vegetables. Try to fill half of your plate at each meal with fruits and vegetables. ? Eat whole grains, such as whole wheat pasta, brown rice, or whole grain bread. Fill about one quarter of your plate with whole grains. ? Eat low-fat diary products. ? Avoid fatty cuts of meat, processed or cured meats, and poultry with skin. Fill about one quarter of your plate with lean proteins such as fish, chicken without skin, beans, eggs, and tofu. ? Avoid premade and processed foods. These tend to be higher in sodium, added sugar, and fat.  Reduce your daily sodium intake. Most people with hypertension should eat less than 1,500 mg of sodium a day.  Limit alcohol intake to no more than 1 drink a day for nonpregnant women and 2 drinks a day for men. One drink equals 12 oz of beer, 5 oz of wine, or 1 oz of hard liquor. Lifestyle  Work with your health care provider to maintain a healthy body weight, or to lose weight. Ask what an ideal weight is for you.  Get at least 30 minutes of exercise that causes your heart to beat faster (aerobic exercise) most days of the week. Activities may include walking, swimming, or biking.  Include exercise   to strengthen your muscles (resistance exercise), such as weight lifting, as part of your weekly exercise routine. Try to do these types of exercises for 30 minutes at least 3 days a week. °· Do not use any products that contain nicotine or tobacco, such as cigarettes and e-cigarettes. If you need help quitting,  ask your health care provider. °· Control any long-term (chronic) conditions you have, such as high cholesterol or diabetes. °Monitoring °· Monitor your blood pressure at home as told by your health care provider. Your personal target blood pressure may vary depending on your medical conditions, your age, and other factors. °· Have your blood pressure checked regularly, as often as told by your health care provider. °Working with your health care provider °· Review all the medicines you take with your health care provider because there may be side effects or interactions. °· Talk with your health care provider about your diet, exercise habits, and other lifestyle factors that may be contributing to hypertension. °· Visit your health care provider regularly. Your health care provider can help you create and adjust your plan for managing hypertension. °Will I need medicine to control my blood pressure? °Your health care provider may prescribe medicine if lifestyle changes are not enough to get your blood pressure under control, and if: °· Your systolic blood pressure is 130 or higher. °· Your diastolic blood pressure is 80 or higher. °Take medicines only as told by your health care provider. Follow the directions carefully. Blood pressure medicines must be taken as prescribed. The medicine does not work as well when you skip doses. Skipping doses also puts you at risk for problems. °Contact a health care provider if: °· You think you are having a reaction to medicines you have taken. °· You have repeated (recurrent) headaches. °· You feel dizzy. °· You have swelling in your ankles. °· You have trouble with your vision. °Get help right away if: °· You develop a severe headache or confusion. °· You have unusual weakness or numbness, or you feel faint. °· You have severe pain in your chest or abdomen. °· You vomit repeatedly. °· You have trouble breathing. °Summary °· Hypertension is when the force of blood pumping  through your arteries is too strong. If this condition is not controlled, it may put you at risk for serious complications. °· Your personal target blood pressure may vary depending on your medical conditions, your age, and other factors. For most people, a normal blood pressure is less than 120/80. °· Hypertension is managed by lifestyle changes, medicines, or both. Lifestyle changes include weight loss, eating a healthy, low-sodium diet, exercising more, and limiting alcohol. °This information is not intended to replace advice given to you by your health care provider. Make sure you discuss any questions you have with your health care provider. °Document Released: 04/17/2012 Document Revised: 06/21/2016 Document Reviewed: 06/21/2016 °Elsevier Interactive Patient Education © 2019 [employer] ° °Diabetes Mellitus and Nutrition, Adult °When you have diabetes (diabetes mellitus), it is very important to have healthy eating habits because your blood sugar (glucose) levels are greatly affected by what you eat and drink. Eating healthy foods in the appropriate amounts, at about the same times every day, can help you: °· Control your blood glucose. °· Lower your risk of heart disease. °· Improve your blood pressure. °· Reach or maintain a healthy weight. °Every person with diabetes is different, and each person has different needs for a meal plan. Your health care provider may recommend   that you work with a diet and nutrition specialist (dietitian) to make a meal plan that is best for you. Your meal plan may vary depending on factors such as: °· The calories you need. °· The medicines you take. °· Your weight. °· Your blood glucose, blood pressure, and cholesterol levels. °· Your activity level. °· Other health conditions you have, such as heart or kidney disease. °How do carbohydrates affect me? °Carbohydrates, also called carbs, affect your blood glucose level more than any other type of food. Eating carbs naturally  raises the amount of glucose in your blood. Carb counting is a method for keeping track of how many carbs you eat. Counting carbs is important to keep your blood glucose at a healthy level, especially if you use insulin or take certain oral diabetes medicines. °It is important to know how many carbs you can safely have in each meal. This is different for every person. Your dietitian can help you calculate how many carbs you should have at each meal and for each snack. °Foods that contain carbs include: °· Bread, cereal, rice, pasta, and crackers. °· Potatoes and corn. °· Peas, beans, and lentils. °· Milk and yogurt. °· Fruit and juice. °· Desserts, such as cakes, cookies, ice cream, and candy. °How does alcohol affect me? °Alcohol can cause a sudden decrease in blood glucose (hypoglycemia), especially if you use insulin or take certain oral diabetes medicines. Hypoglycemia can be a life-threatening condition. Symptoms of hypoglycemia (sleepiness, dizziness, and confusion) are similar to symptoms of having too much alcohol. °If your health care provider says that alcohol is safe for you, follow these guidelines: °· Limit alcohol intake to no more than 1 drink per day for nonpregnant women and 2 drinks per day for men. One drink equals 12 oz of beer, 5 oz of wine, or 1½ oz of hard liquor. °· Do not drink on an empty stomach. °· Keep yourself hydrated with water, diet soda, or unsweetened iced tea. °· Keep in mind that regular soda, juice, and other mixers may contain a lot of sugar and must be counted as carbs. °What are tips for following this plan? ° °Reading food labels °· Start by checking the serving size on the "Nutrition Facts" label of packaged foods and drinks. The amount of calories, carbs, fats, and other nutrients listed on the label is based on one serving of the item. Many items contain more than one serving per package. °· Check the total grams (g) of carbs in one serving. You can calculate the number  of servings of carbs in one serving by dividing the total carbs by 15. For example, if a food has 30 g of total carbs, it would be equal to 2 servings of carbs. °· Check the number of grams (g) of saturated and trans fats in one serving. Choose foods that have low or no amount of these fats. °· Check the number of milligrams (mg) of salt (sodium) in one serving. Most people should limit total sodium intake to less than 2,300 mg per day. °· Always check the nutrition information of foods labeled as "low-fat" or "nonfat". These foods may be higher in added sugar or refined carbs and should be avoided. °· Talk to your dietitian to identify your daily goals for nutrients listed on the label. °Shopping °· Avoid buying canned, premade, or processed foods. These foods tend to be high in fat, sodium, and added sugar. °· Shop around the outside edge of the grocery store.   This includes fresh fruits and vegetables, bulk grains, fresh meats, and fresh dairy. °Cooking °· Use low-heat cooking methods, such as baking, instead of high-heat cooking methods like deep frying. °· Cook using healthy oils, such as olive, canola, or sunflower oil. °· Avoid cooking with butter, cream, or high-fat meats. °Meal planning °· Eat meals and snacks regularly, preferably at the same times every day. Avoid going long periods of time without eating. °· Eat foods high in fiber, such as fresh fruits, vegetables, beans, and whole grains. Talk to your dietitian about how many servings of carbs you can eat at each meal. °· Eat 4-6 ounces (oz) of lean protein each day, such as lean meat, chicken, fish, eggs, or tofu. One oz of lean protein is equal to: °? 1 oz of meat, chicken, or fish. °? 1 egg. °? ¼ cup of tofu. °· Eat some foods each day that contain healthy fats, such as avocado, nuts, seeds, and fish. °Lifestyle °· Check your blood glucose regularly. °· Exercise regularly as told by your health care provider. This may include: °? 150 minutes of  moderate-intensity or vigorous-intensity exercise each week. This could be brisk walking, biking, or water aerobics. °? Stretching and doing strength exercises, such as yoga or weightlifting, at least 2 times a week. °· Take medicines as told by your health care provider. °· Do not use any products that contain nicotine or tobacco, such as cigarettes and e-cigarettes. If you need help quitting, ask your health care provider. °· Work with a counselor or diabetes educator to identify strategies to manage stress and any emotional and social challenges. °Questions to ask a health care provider °· Do I need to meet with a diabetes educator? °· Do I need to meet with a dietitian? °· What number can I call if I have questions? °· When are the best times to check my blood glucose? °Where to find more information: °· American Diabetes Association: diabetes.org °· Academy of Nutrition and Dietetics: www.eatright.org °· National Institute of Diabetes and Digestive and Kidney Diseases (NIH): www.niddk.nih.gov °Summary °· A healthy meal plan will help you control your blood glucose and maintain a healthy lifestyle. °· Working with a diet and nutrition specialist (dietitian) can help you make a meal plan that is best for you. °· Keep in mind that carbohydrates (carbs) and alcohol have immediate effects on your blood glucose levels. It is important to count carbs and to use alcohol carefully. °This information is not intended to replace advice given to you by your health care provider. Make sure you discuss any questions you have with your health care provider. °Document Released: 04/20/2005 Document Revised: 02/21/2017 Document Reviewed: 08/28/2016 °Elsevier Interactive Patient Education © 2019 [employer] ° °

## 2018-08-12 NOTE — Progress Notes (Signed)
Subjective:    Patient ID: Robert Cordova, male    DOB: Dec 26, 1950, 68 y.o.   MRN: [id number]  No chief complaint on file.   HPI Patient was seen today for f/u on chronic conditions and TOC, previously seen by Dr. Sherren Mocha.  Skin concern: -LUE with bump -present x 3 months -Nonpruritic, no drainage noted -Does not recall, insect bite -Endorses concern about cancer as several family improvements recently diagnosed.  Nocturnal hypoxemia: -Does not have OSA -Endorses low O2 noted during sleep study -Wears 2.5 L O2 via [place] at night -Does not have a pulmonologist.  DM: -Followed by Dr. Julianne Rice, endocrinology -Taking metformin 500 mg in a.m. and 2000 mg in p.m. -Bedside 5 mg every morning and Jardiance 25 mg -Notes fsbs high in a.m.  HTN: -taking losartan 50 mg daily -occasionally checks bp at home 140/80s -endorses adding salt to food -Not drinking much water daily.  May drink 1 gallon tea, juice, flavored water per day -also has rx for HCTZ 25 mg.  Takes for LE edema when traveling   Past Medical History:  Diagnosis Date  . ABNORMAL GLUCOSE NEC 03/18/2007  . Arthritis    fingers  . BLEPHARITIS, LEFT 10/29/2009  . DIABETES-TYPE 2 04/18/2010  . Dry throat   . Environmental allergies   . GERD (gastroesophageal reflux disease)   . H/O hiatal hernia   . HYPERLIPIDEMIA 10/22/2007  . HYPERTENSION 03/18/2007  . Multiple thyroid nodules    "biopsy done, everything was fine" being monitored  . NEVUS, MELANOCYTIC, FACE 10/07/2009    Allergies  Allergen Reactions  . Ace Inhibitors     REACTION: Coughing    ROS General: Denies fever, chills, night sweats, changes in weight, changes in appetite HEENT: Denies headaches, ear pain, changes in vision, rhinorrhea, sore throat CV: Denies CP, palpitations, SOB, orthopnea Pulm: Denies SOB, cough, wheezing GI: Denies abdominal pain, nausea, vomiting, diarrhea, constipation GU: Denies dysuria, hematuria, frequency, vaginal  discharge Msk: Denies muscle cramps, joint pains Neuro: Denies weakness, numbness, tingling Skin: Denies rashes, bruising Psych: Denies depression, anxiety, hallucinations      Objective:    Blood pressure 118/66, temperature 98 F (36.7 C), temperature source Oral, height 5\' 10"  (1.778 m), weight 224 lb (101.6 kg).  Gen. Pleasant, well-nourished, in no distress, normal affect   HEENT: [place]/AT, face symmetric, no scleral icterus, PERRLA, , nares patent without drainage Lungs: no accessory muscle use, CTAB, no wheezes or rales Cardiovascular: RRR, no m/r/g, no peripheral edema Abdomen: BS present, soft, NT/ND Neuro:  A&Ox3, CN II-XII intact, normal gait Skin:  Warm, dry, intact.  Upper extremity with 1cm in diameter, rounded nodule small punctum noted.  Inferior border with clear/serosanguineous appearing fluid. No drainage noted.  No erythema surrounding nodule.  Wt Readings from Last 3 Encounters:  08/12/18 224 lb (101.6 kg)  07/02/18 224 lb (101.6 kg)  05/31/18 227 lb (103 kg)    Lab Results  Component Value Date   WBC 8.4 05/07/2018   HGB 14.9 05/07/2018   HCT 44.3 05/07/2018   PLT 317.0 05/07/2018   GLUCOSE 117 (H) 05/07/2018   CHOL 141 05/07/2018   TRIG 268.0 (H) 05/07/2018   HDL 35.20 (L) 05/07/2018   LDLDIRECT 72.0 05/07/2018   LDLCALC 51 01/25/2018   ALT 15 05/07/2018   AST 11 05/07/2018   NA 139 05/07/2018   K 4.5 05/07/2018   CL 103 05/07/2018   CREATININE 0.77 05/07/2018   BUN 21 05/07/2018   CO2 28 05/07/2018  TSH 0.74 05/07/2018   PSA 2.43 05/07/2018   HGBA1C 8.0 (H) 05/07/2018   MICROALBUR 0.8 05/07/2018    Assessment/Plan:  Essential hypertension -controlled -continue losartan 50 mg daily -Patient encouraged to decrease sodium intake and increase p.o. intake of water -Discussed checking blood pressure regularly at home and keeping a log to bring with him to clinic -Given handout  Type 2 diabetes mellitus with hyperglycemia, without long-term  current use of insulin (HCC) -Last hemoglobin A1c 8% on 05/07/2018 -Continue Jardiance 25 mg, glipizide 5 mg, metformin 500 mg in a.m. and 2000 mg in p.m. -Continue follow-up with Dr. Cruzita Lederer  Nocturnal hypoxemia -Continue 2.5 L O2 nightly -Discussed need for follow-up with pulmonology  Skin nodule  -Discussed scheduling removal/biopsy of area -Discussed referral to dermatology, however pt would like area removed in clinic if possible.  Follow-up PRN in the next few months  Grier Mitts, MD

## 2018-08-26 ENCOUNTER — Other Ambulatory Visit: Payer: Self-pay | Admitting: Internal Medicine

## 2018-08-30 ENCOUNTER — Other Ambulatory Visit: Payer: Self-pay | Admitting: Internal Medicine

## 2018-09-07 DIAGNOSIS — J9610 Chronic respiratory failure, unspecified whether with hypoxia or hypercapnia: Secondary | ICD-10-CM | POA: Diagnosis not present

## 2018-09-10 ENCOUNTER — Other Ambulatory Visit: Payer: Self-pay | Admitting: Family Medicine

## 2018-09-10 DIAGNOSIS — L989 Disorder of the skin and subcutaneous tissue, unspecified: Secondary | ICD-10-CM

## 2018-09-11 ENCOUNTER — Ambulatory Visit: Payer: Medicare HMO | Admitting: Family Medicine

## 2018-09-27 ENCOUNTER — Encounter: Payer: Self-pay | Admitting: Internal Medicine

## 2018-09-27 ENCOUNTER — Ambulatory Visit: Payer: Medicare HMO | Admitting: Internal Medicine

## 2018-09-27 VITALS — BP 128/80 | HR 82 | Ht 70.0 in | Wt 225.0 lb

## 2018-09-27 DIAGNOSIS — E042 Nontoxic multinodular goiter: Secondary | ICD-10-CM | POA: Diagnosis not present

## 2018-09-27 DIAGNOSIS — E1110 Type 2 diabetes mellitus with ketoacidosis without coma: Secondary | ICD-10-CM

## 2018-09-27 DIAGNOSIS — E782 Mixed hyperlipidemia: Secondary | ICD-10-CM

## 2018-09-27 DIAGNOSIS — E049 Nontoxic goiter, unspecified: Secondary | ICD-10-CM

## 2018-09-27 LAB — POCT GLYCOSYLATED HEMOGLOBIN (HGB A1C): HEMOGLOBIN A1C: 7.1 % — AB (ref 4.0–5.6)

## 2018-09-27 MED ORDER — SEMAGLUTIDE(0.25 OR 0.5MG/DOS) 2 MG/1.5ML ~~LOC~~ SOPN: 0.5000 mg | PEN_INJECTOR | SUBCUTANEOUS | 5 refills | Status: DC

## 2018-09-27 NOTE — Patient Instructions (Addendum)
Please continue: - Glipizide 5 mg before b'fast and 10 mg before dinner - Jardiance 25 mg before b'fast  Please decrease: - Metformin to 2000 mg with dinner  Please start Ozempic 0.25 mg weekly in a.m. (for example on Sunday morning) x 4 weeks, then increase to 0.5 mg weekly in a.m. if no nausea or hypoglycemia.  Please return in 4 months with your sugar log

## 2018-09-27 NOTE — Progress Notes (Signed)
Patient ID: Robert Cordova, male   DOB: [birth year], 68 y.o.   MRN: [id number]   HPI: Robert Cordova is a 68 y.o.-year-old male, returning for f/u for DM2, dx in 2013, non-insulin-dependent, uncontrolled, with complications (h/o DKA). Last visit 4 months ago.  Last hemoglobin A1c was: Lab Results  Component Value Date   HGBA1C 8.0 (H) 05/07/2018   HGBA1C 6.6 (A) 01/25/2018   HGBA1C 6.3 06/19/2017  08/22/2016: 7.7%  He is on: - Metformin 1000 mg 2x a day, with meals >> 2000 mg with dinner (and he added 500 mg with b'fast since last OV) - Glipizide 5 mg before b'fast and 10 mg before dinner - Jardiance 25 mg before b'fast Of note, He was off and on insulin in the past. He came off when he lost weight after his lab band surgery in 2014 (45 pounds)  He is checking sugars 1-2 times a day: - am:115-179, 213 >> 118-191, 220 >> 129, 152-212 - 2h after b'fast: 87-125, 147 >> n/c - before lunch:74-132 >> 108-151 >> 98-142 - 2h after lunch: 100, 153 >> n/c >> 118-215 >> n/c - before dinner:131 >> n/c >> 90-124 >> n/c - 2h after dinner:328 (cheese cake) >> n/c - bedtime:  79-153, 201, 253 >> 121-313 >> 148-216, 249 - nighttime: n/c >> 212 >> n/c Lowest sugar was 98; he has hypoglycemia awareness in the 70s. Highest sugar was 249.  Pt's meals are: - Breakfast: Protein shake - Lunch: Eggs, bacon or leftovers - Dinner: Meat + veggies + starch, pizza, [place] food - Snacks: In the evening: cookies, etc.  -No CKD, last BUN/creatinine:  Lab Results  Component Value Date   BUN 21 05/07/2018   BUN 19 01/25/2018   CREATININE 0.77 05/07/2018   CREATININE 0.78 01/25/2018  On losartan 25 mg twice a day. -+ HL; last set of lipids: Lab Results  Component Value Date   CHOL 141 05/07/2018   HDL 35.20 (L) 05/07/2018   LDLCALC 51 01/25/2018   LDLDIRECT 72.0 05/07/2018   TRIG 268.0 (H) 05/07/2018   CHOLHDL 4 05/07/2018  On Lipitor, co-Q10, Krill oil. - last eye exam was on 01/2018:  No DR, + small cataract.[facility]. -No numbness and tingling in his feet.  He has a history of thyroid nodules-S/P 2 benign FNAs in 2014.  Latest TSH was reviewed and this was normal: Lab Results  Component Value Date   TSH 0.74 05/07/2018   Reviewed his latest thyroid ultrasound report. Thyroid U/S (10/20/2016): Stable 2.8 cm solid nodule in the inferior right lobe.  This was previously sampled with negative biopsy.  The 1.8 cm nodule previously sampled in the right isthmus was no longer identified by ultrasound.  He also has sleep-related hypoxia, HTN.  He has Medicare with 2 supplemental insurances.  ROS: Constitutional: no weight gain/no weight loss, no fatigue, no subjective hyperthermia, no subjective hypothermia Eyes: no blurry vision, no xerophthalmia ENT: no sore throat, no nodules palpated in neck, no dysphagia, no odynophagia, no hoarseness Cardiovascular: no CP/no SOB/no palpitations/no leg swelling Respiratory: no cough/no SOB/no wheezing Gastrointestinal: no N/no V/no D/no C/no acid reflux Musculoskeletal: no muscle aches/+ joint aches Skin: no rashes, no hair loss Neurological: + tremors/no numbness/no tingling/no dizziness  I reviewed pt's medications, allergies, PMH, social hx, family hx, and changes were documented in the history of present illness. Otherwise, unchanged from my initial visit note.  Past Medical History:  Diagnosis Date  . ABNORMAL GLUCOSE NEC 03/18/2007  . Arthritis  fingers  . BLEPHARITIS, LEFT 10/29/2009  . DIABETES-TYPE 2 04/18/2010  . Dry throat   . Environmental allergies   . GERD (gastroesophageal reflux disease)   . H/O hiatal hernia   . HYPERLIPIDEMIA 10/22/2007  . HYPERTENSION 03/18/2007  . Multiple thyroid nodules    "biopsy done, everything was fine" being monitored  . NEVUS, MELANOCYTIC, FACE 10/07/2009   Past Surgical History:  Procedure Laterality Date  . BIOPSY THYROID  ~08/2013  . BREATH TEK H PYLORI N/A  09/05/2013   Procedure: BREATH TEK H PYLORI;  Surgeon: [name], MD;  Location: [facility];  Service: General;  Laterality: N/A;  . COLONOSCOPY  2009, 2013   Dr. Sharlett Iles; multiple polyps  . HIATAL HERNIA REPAIR  11/25/2013   Procedure: LAPAROSCOPIC REPAIR OF HIATAL HERNIA;  Surgeon: [name], MD;  Location: [facility];  Service: General;;  . LAPAROSCOPIC GASTRIC BANDING N/A 11/25/2013   Procedure: LAPAROSCOPIC GASTRIC BANDING;  Surgeon: [name], MD;  Location: [facility];  Service: General;  Laterality: N/A;  . NO PAST SURGERIES     Social History   Social History  . Marital status: Married    Spouse name: N/A  . Number of children: 2   Occupational History  . 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 Smoking status: Never Smoker  . Smokeless tobacco: Never Used  . Alcohol use No  . Drug use: No   Current Outpatient Medications on File Prior to Visit  Medication Sig Dispense Refill  . aspirin 81 MG tablet Take 1 tablet (81 mg total) by mouth daily. 30 tablet 5  . atorvastatin (LIPITOR) 20 MG tablet Take 1 tablet (20 mg total) by mouth daily. 90 tablet 4  . Blood Glucose Monitoring Suppl (TRUE METRIX AIR GLUCOSE METER) w/Device KIT 1 Device by Other route as directed. 1 kit 0  . Cholecalciferol (VITAMIN D PO) Take 50 mcg by mouth daily.    . Coenzyme Q10 (CO Q-10) 200 MG CAPS Take by mouth daily.    . fluticasone (FLONASE) 50 MCG/ACT nasal spray Place 2 sprays into both nostrils daily. 48 g 11  . glipiZIDE (GLUCOTROL) 5 MG tablet TAKE 1 TABLET TWICE DAILY BEFORE MEALS 180 tablet 3  . glucose blood (ONETOUCH VERIO) test strip Use as instructed x 1 a day 100 each 12  . glucose blood (TRUE METRIX BLOOD GLUCOSE TEST) test strip Use as instructed two times daily 100 each 12  . hydrochlorothiazide (HYDRODIURIL) 25 MG tablet TAKE 1 TABLET (25 MG TOTAL) BY MOUTH DAILY. 90 tablet 3  . JARDIANCE 25 MG TABS tablet TAKE 1 TABLET EVERY DAY 90  tablet 1  . losartan (COZAAR) 50 MG tablet Take 1 tablet (50 mg total) by mouth daily. 90 tablet 4  . Magnesium 250 MG TABS Take by mouth daily.     . metFORMIN (GLUCOPHAGE) 500 MG tablet TAKE 2 TABLETS TWICE DAILY WITH MEALS 360 tablet 3  . minoxidil (ROGAINE) 2 % external solution Apply 1 application topically daily. 60 mL 10  . TRUEPLUS LANCETS 33G MISC Use as instructed two times daily 100 each 12  . OXYGEN-HELIUM IN Inhale into the lungs.     No current facility-administered medications on file prior to visit.    Allergies  Allergen Reactions  . Ace Inhibitors     REACTION: Coughing   Family History  Problem Relation Age of Onset  . Hyperlipidemia Mother   . Hypertension Mother   .  Hyperlipidemia Father   . Hypertension Father   . Heart attack Father   . Hyperlipidemia Brother   . Diabetes Maternal Grandmother   . Diabetes Maternal Grandfather   . Colon cancer Neg Hx   . Stomach cancer Neg Hx   . Esophageal cancer Neg Hx   . Rectal cancer Neg Hx     PE: Ht '5\' 10"'  (1.778 m)   BMI 32.14 kg/m  Wt Readings from Last 3 Encounters:  08/12/18 224 lb (101.6 kg)  07/02/18 224 lb (101.6 kg)  05/31/18 227 lb (103 kg)   Constitutional: overweight, in NAD Eyes: PERRLA, EOMI, no exophthalmos ENT: moist mucous membranes, no thyromegaly but large thyroid nodule palpated in isthmus, no cervical lymphadenopathy Cardiovascular: RRR, No MRG Respiratory: CTA B Gastrointestinal: abdomen soft, NT, ND, BS+ Musculoskeletal: no deformities, strength intact in all 4 Skin: moist, warm, no rashes Neurological: + (Essential) tremor with outstretched hands, DTR normal in all 4   ASSESSMENT: 1. DM2, non-insulin-dependent, uncontrolled, without long term complications, but with hyperglycemia - h/o DKA  2. Thyroid nodules  3.  Goiter  4. HL  PLAN:  1. Patient with longstanding, uncontrolled, type 2 diabetes, on oral medications.  His most recent HbA1c was 8%, increased from 6.6%,  before the holidays.  At last visit he describes that he started to eat more snacks especially at night even though he was not hungry.  This was the most likely reason for his high blood sugars in the morning and I strongly advised him to stop eating after dinner.  We did not change the regimen at last visit, but I advised him to let me know if sugars did not improve after improving his diet to see if we need to add GLP-1 receptor agonist. -At this visit, sugars appear slightly better, but they are still above goal.  They are usually high after dinner and in the morning and improve to target or close to target in the middle of the day.  He agrees to add a GLP-1 receptor agonist.  We discussed about benefits and possible side effects.  We will start at a low dose and advance as tolerated.  In the meantime, will decrease metformin to only 2000 mg with dinner.  I also advised him that we may need to decrease glipizide if he starts dropping his sugars too much.  My goal would be to have him off glipizide in the near future. - I suggested to:  Patient Instructions  Please continue: - Glipizide 5 mg before b'fast and 10 mg before dinner - Jardiance 25 mg before b'fast  Please decrease: - Metformin to 2000 mg with dinner  Please start Ozempic 0.25 mg weekly in a.m. (for example on Sunday morning) x 4 weeks, then increase to 0.5 mg weekly in a.m. if no nausea or hypoglycemia.  Please return in 4 months with your sugar log  - today, HbA1c is 7.1% (better) - continue checking sugars at different times of the day - check 1x a day, rotating checks - advised for yearly eye exams >> he is UTD - Return to clinic in 3 mo with sugar log   2. Thyroid nodules and goiter -Patient has a history of 2 thyroid nodules that have been evaluated in the past by FNA in 2014 with benign results -Most recent thyroid ultrasound showed stable nodules -Most recent TSH was normal 05/2018 -No neck compression symptoms -No  intervention needed for now  3. HL - Reviewed latest lipid panel from  05/2018: LDL at goal, slightly higher than before, triglycerides high, HDL low Lab Results  Component Value Date   CHOL 141 05/07/2018   HDL 35.20 (L) 05/07/2018   LDLCALC 51 01/25/2018   LDLDIRECT 72.0 05/07/2018   TRIG 268.0 (H) 05/07/2018   CHOLHDL 4 05/07/2018  - Continues Lipitor, CoQ10, Krill oil without side effects.  Philemon Kingdom, MD PhD [facility]

## 2018-10-06 DIAGNOSIS — J9610 Chronic respiratory failure, unspecified whether with hypoxia or hypercapnia: Secondary | ICD-10-CM | POA: Diagnosis not present

## 2018-10-08 ENCOUNTER — Other Ambulatory Visit: Payer: Self-pay

## 2018-10-08 DIAGNOSIS — D3131 Benign neoplasm of right choroid: Secondary | ICD-10-CM | POA: Diagnosis not present

## 2018-10-08 MED ORDER — SEMAGLUTIDE(0.25 OR 0.5MG/DOS) 2 MG/1.5ML ~~LOC~~ SOPN: 0.5000 mg | PEN_INJECTOR | SUBCUTANEOUS | 5 refills | Status: DC

## 2018-11-06 DIAGNOSIS — J9610 Chronic respiratory failure, unspecified whether with hypoxia or hypercapnia: Secondary | ICD-10-CM | POA: Diagnosis not present

## 2018-11-21 DIAGNOSIS — D485 Neoplasm of uncertain behavior of skin: Secondary | ICD-10-CM | POA: Diagnosis not present

## 2018-11-21 DIAGNOSIS — L989 Disorder of the skin and subcutaneous tissue, unspecified: Secondary | ICD-10-CM | POA: Diagnosis not present

## 2018-12-06 DIAGNOSIS — J9610 Chronic respiratory failure, unspecified whether with hypoxia or hypercapnia: Secondary | ICD-10-CM | POA: Diagnosis not present

## 2019-01-06 DIAGNOSIS — J9610 Chronic respiratory failure, unspecified whether with hypoxia or hypercapnia: Secondary | ICD-10-CM | POA: Diagnosis not present

## 2019-01-26 ENCOUNTER — Encounter: Payer: Self-pay | Admitting: Internal Medicine

## 2019-01-30 ENCOUNTER — Ambulatory Visit: Payer: Medicare HMO | Admitting: Internal Medicine

## 2019-02-05 DIAGNOSIS — J9610 Chronic respiratory failure, unspecified whether with hypoxia or hypercapnia: Secondary | ICD-10-CM | POA: Diagnosis not present

## 2019-02-10 DIAGNOSIS — D485 Neoplasm of uncertain behavior of skin: Secondary | ICD-10-CM | POA: Diagnosis not present

## 2019-02-10 DIAGNOSIS — L989 Disorder of the skin and subcutaneous tissue, unspecified: Secondary | ICD-10-CM | POA: Diagnosis not present

## 2019-03-01 IMAGING — DX DG CHEST 2V
2 series · 2 of 2 positions shown · non-contrast
Comparison: 05/07/2018

CLINICAL DATA: Shortness of breath

EXAM:
CHEST - 2 VIEW

[chest pa]
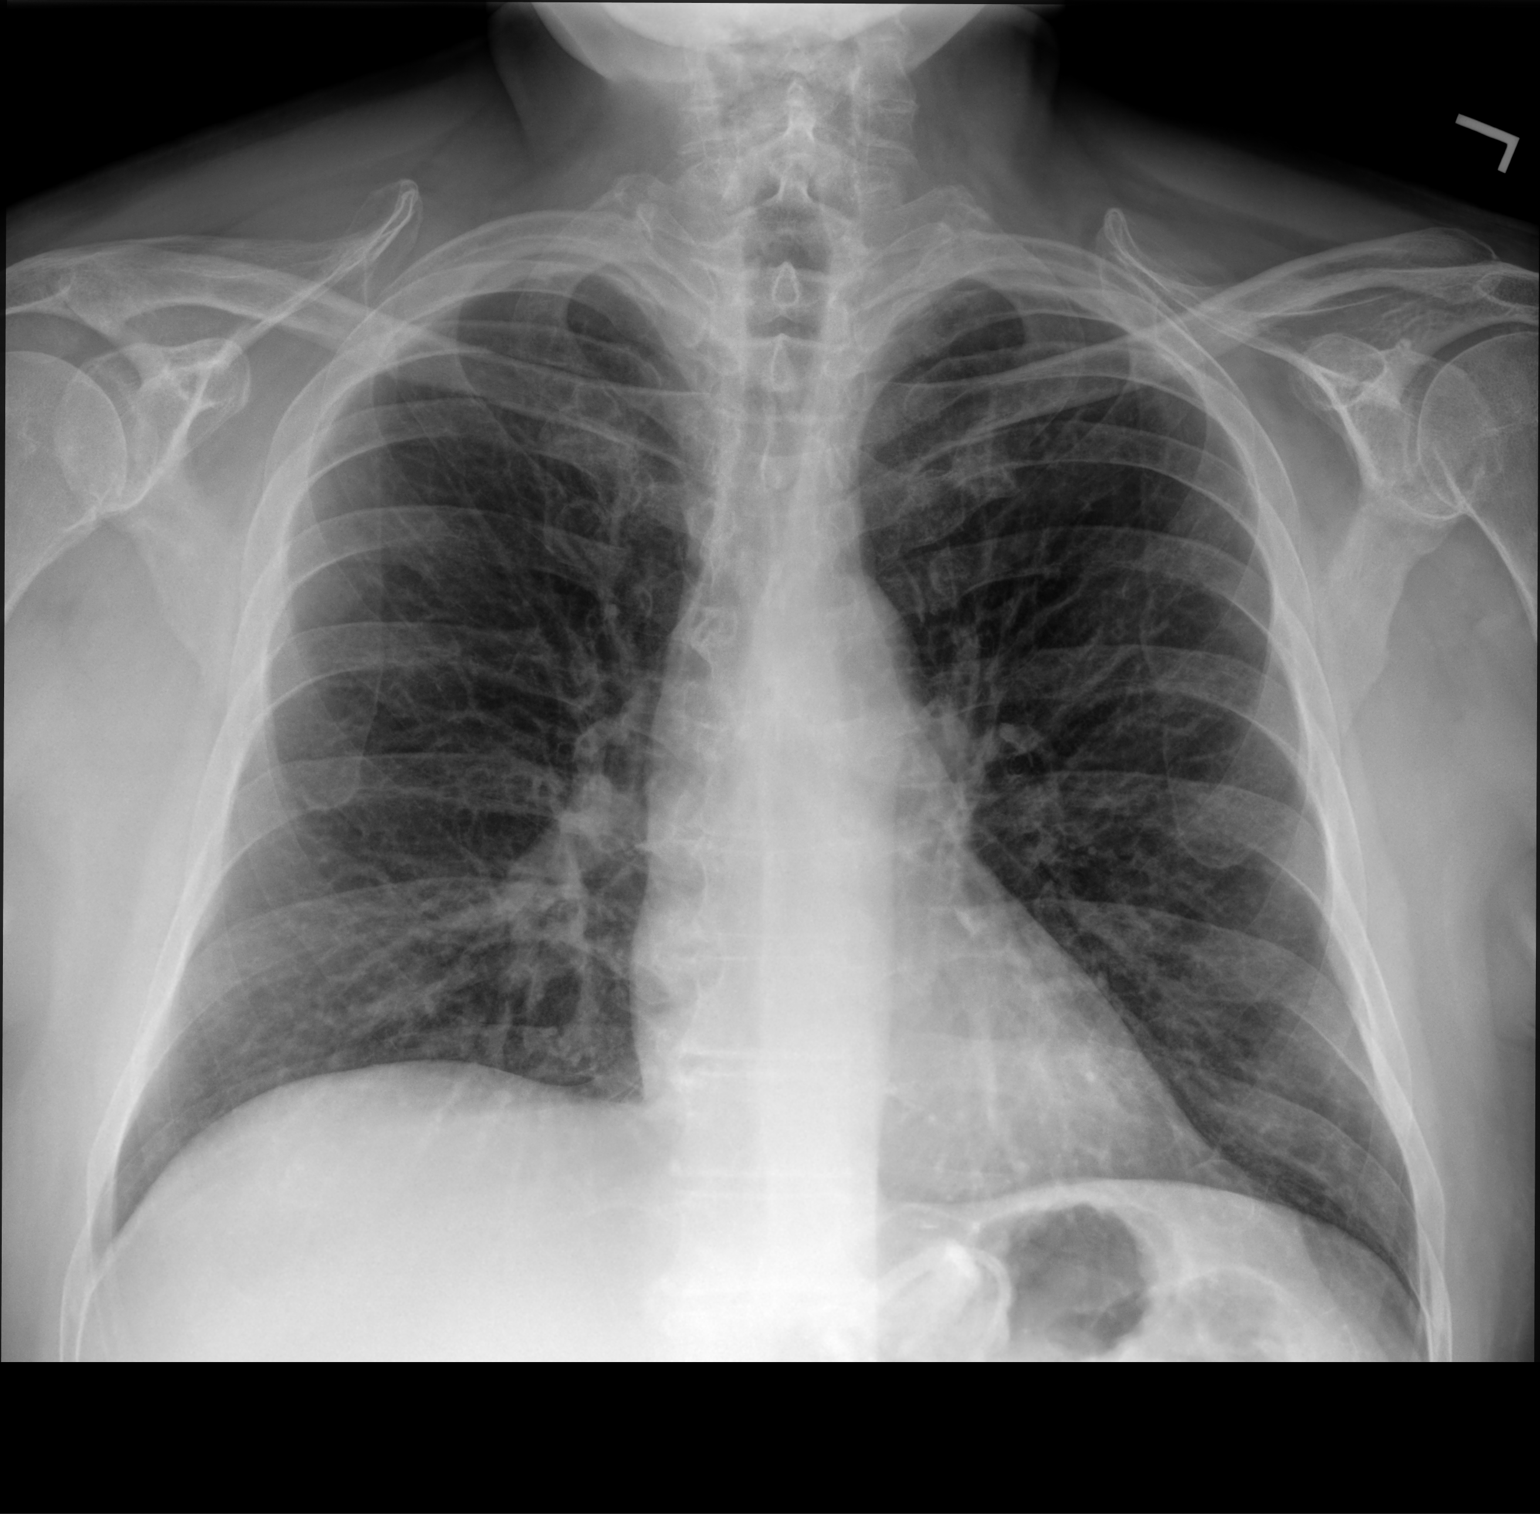

[chest lat]
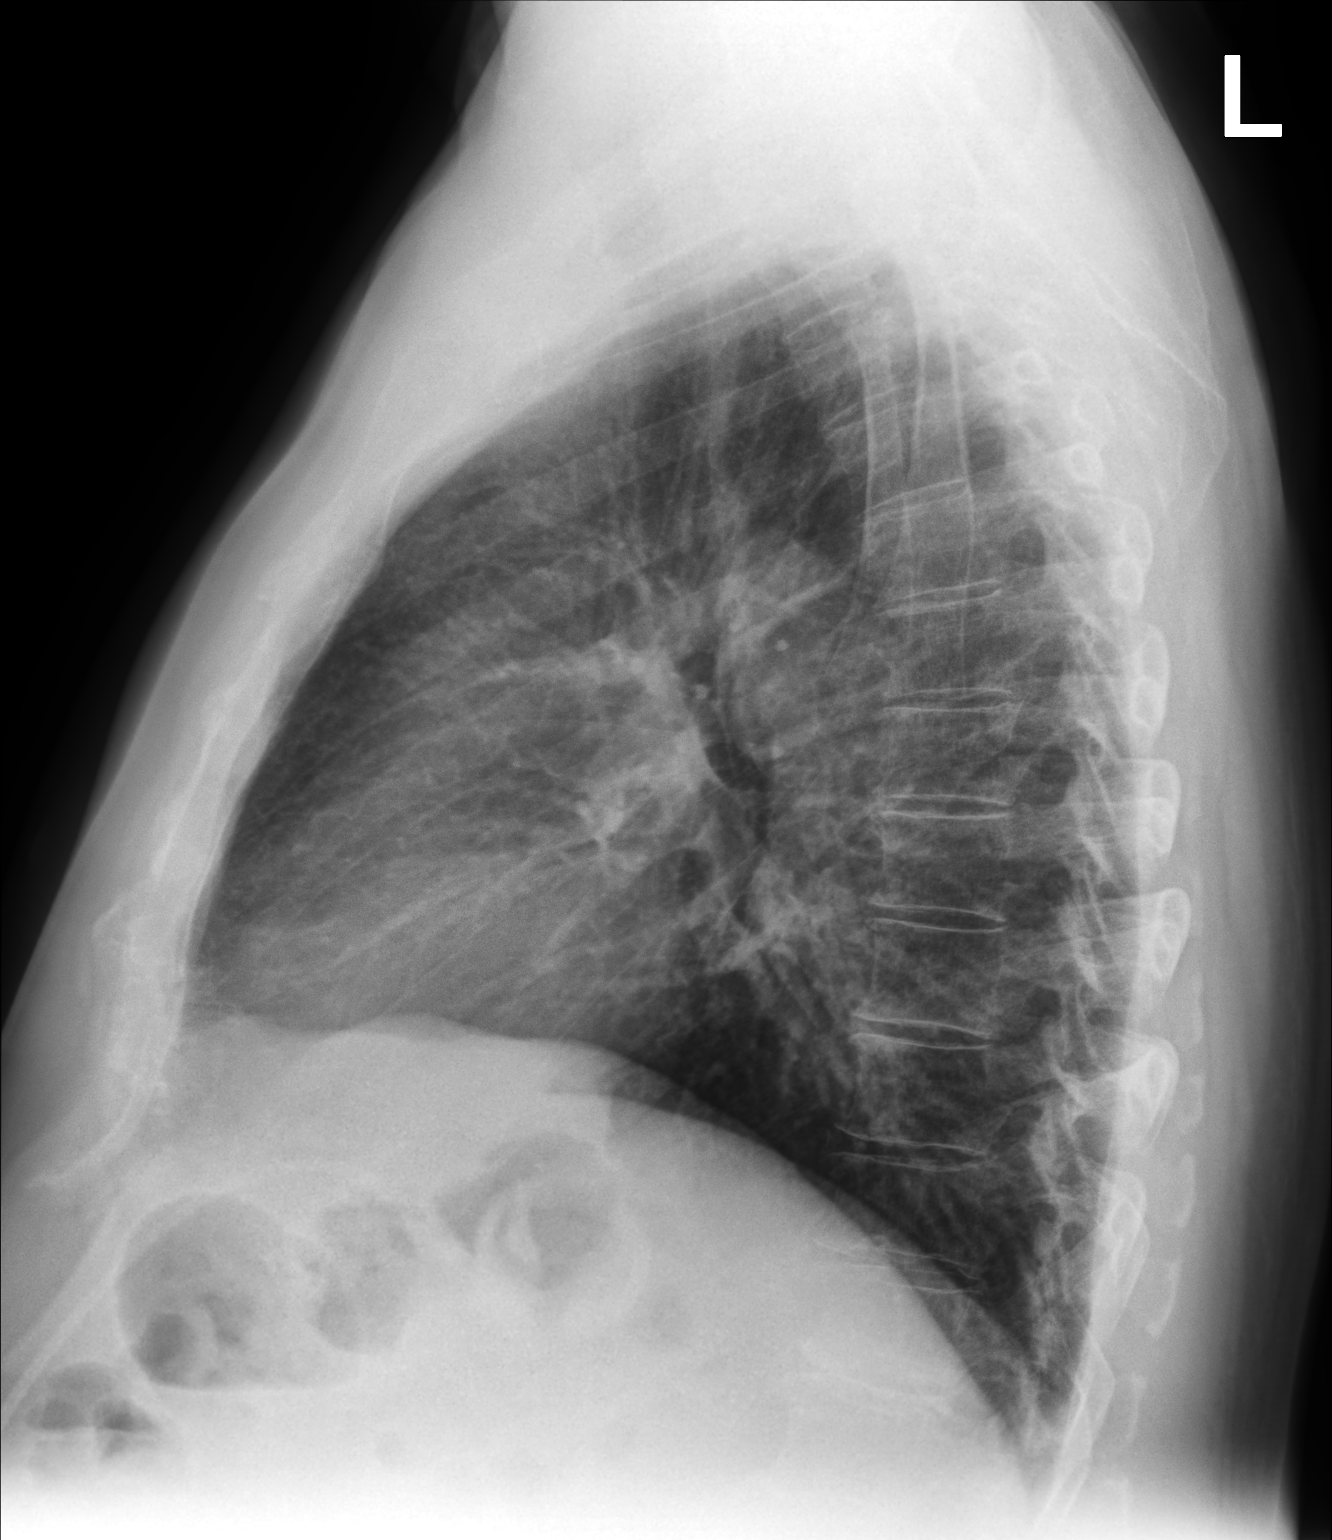

[2 of 2 positions shown; findings below may reference images not displayed]

FINDINGS: Heart and mediastinal contours are within normal limits. No focal
opacities or effusions. No acute bony abnormality.
IMPRESSION: No active cardiopulmonary disease.

## 2019-03-02 ENCOUNTER — Other Ambulatory Visit: Payer: Self-pay | Admitting: Internal Medicine

## 2019-03-08 DIAGNOSIS — J9610 Chronic respiratory failure, unspecified whether with hypoxia or hypercapnia: Secondary | ICD-10-CM | POA: Diagnosis not present

## 2019-04-08 DIAGNOSIS — J9610 Chronic respiratory failure, unspecified whether with hypoxia or hypercapnia: Secondary | ICD-10-CM | POA: Diagnosis not present

## 2019-04-15 ENCOUNTER — Telehealth (INDEPENDENT_AMBULATORY_CARE_PROVIDER_SITE_OTHER): Payer: Medicare HMO | Admitting: Family Medicine

## 2019-04-15 ENCOUNTER — Other Ambulatory Visit: Payer: Self-pay

## 2019-04-15 DIAGNOSIS — M1990 Unspecified osteoarthritis, unspecified site: Secondary | ICD-10-CM | POA: Diagnosis not present

## 2019-04-15 DIAGNOSIS — I10 Essential (primary) hypertension: Secondary | ICD-10-CM

## 2019-04-15 DIAGNOSIS — M545 Low back pain, unspecified: Secondary | ICD-10-CM

## 2019-04-15 DIAGNOSIS — R252 Cramp and spasm: Secondary | ICD-10-CM

## 2019-04-15 MED ORDER — LOSARTAN POTASSIUM 50 MG PO TABS: 50.0000 mg | ORAL_TABLET | Freq: Every day | ORAL | 4 refills | Status: DC

## 2019-04-15 NOTE — Progress Notes (Signed)
Virtual Visit via Video Note  I connected with Robert Cordova on 04/15/19 at  2:00 PM EDT by a video enabled telemedicine application 2/2 [id number] pandemic and verified that I am speaking with the correct person using two identifiers.  Location patient: home Location provider:work or home office Persons participating in the virtual visit: patient, provider  I discussed the limitations of evaluation and management by telemedicine and the availability of in person appointments. The patient expressed understanding and agreed to proceed.   HPI: Pt following up on chronic conditions.  Pt states feel like he is "getting old".  Notes arthritis in wrists.  Cramps in legs if outside sweating a lot.  Will drink more water when it happens.  Has not been checking bp, but feels good.  Taking losartan 50 mg.  Having to use lidocaine patches for back pain.  Notes in low back if does yard work.  Pt also inquires about influenza and shingles vaccine.  States had a shingles shot in the past, but was advised he needed shingrix.  Pt was hesitant to get one as his wife is on chemo.  Pt's wife is almost done with treatments so he is considering getting one.   ROS: See pertinent positives and negatives per HPI.  Past Medical History:  Diagnosis Date  . ABNORMAL GLUCOSE NEC 03/18/2007  . Arthritis    fingers  . BLEPHARITIS, LEFT 10/29/2009  . DIABETES-TYPE 2 04/18/2010  . Dry throat   . Environmental allergies   . GERD (gastroesophageal reflux disease)   . H/O hiatal hernia   . HYPERLIPIDEMIA 10/22/2007  . HYPERTENSION 03/18/2007  . Multiple thyroid nodules    "biopsy done, everything was fine" being monitored  . NEVUS, MELANOCYTIC, FACE 10/07/2009    Past Surgical History:  Procedure Laterality Date  . BIOPSY THYROID  ~08/2013  . BREATH TEK H PYLORI N/A 09/05/2013   Procedure: BREATH TEK H PYLORI;  Surgeon: [name], MD;  Location: [facility];  Service: General;  Laterality: N/A;  .  COLONOSCOPY  2009, 2013   Dr. Sharlett Iles; multiple polyps  . HIATAL HERNIA REPAIR  11/25/2013   Procedure: LAPAROSCOPIC REPAIR OF HIATAL HERNIA;  Surgeon: [name], MD;  Location: [facility];  Service: General;;  . LAPAROSCOPIC GASTRIC BANDING N/A 11/25/2013   Procedure: LAPAROSCOPIC GASTRIC BANDING;  Surgeon: [name], MD;  Location: [facility];  Service: General;  Laterality: N/A;  . NO PAST SURGERIES      Family History  Problem Relation Age of Onset  . Hyperlipidemia Mother   . Hypertension Mother   . Hyperlipidemia Father   . Hypertension Father   . Heart attack Father   . Hyperlipidemia Brother   . Diabetes Maternal Grandmother   . Diabetes Maternal Grandfather   . Colon cancer Neg Hx   . Stomach cancer Neg Hx   . Esophageal cancer Neg Hx   . Rectal cancer Neg Hx      Current Outpatient Medications:  .  aspirin 81 MG tablet, Take 1 tablet (81 mg total) by mouth daily., Disp: 30 tablet, Rfl: 5 .  atorvastatin (LIPITOR) 20 MG tablet, Take 1 tablet (20 mg total) by mouth daily., Disp: 90 tablet, Rfl: 4 .  Blood Glucose Monitoring Suppl (TRUE METRIX AIR GLUCOSE METER) w/Device KIT, 1 Device by Other route as directed., Disp: 1 kit, Rfl: 0 .  Cholecalciferol (VITAMIN D PO), Take 50 mcg by mouth daily., Disp: , Rfl:  .  Coenzyme Q10 (CO Q-10)  200 MG CAPS, Take by mouth daily., Disp: , Rfl:  .  fluticasone (FLONASE) 50 MCG/ACT nasal spray, Place 2 sprays into both nostrils daily., Disp: 48 g, Rfl: 11 .  glipiZIDE (GLUCOTROL) 5 MG tablet, TAKE 1 TABLET TWICE DAILY BEFORE MEALS, Disp: 180 tablet, Rfl: 3 .  glucose blood (ONETOUCH VERIO) test strip, Use as instructed x 1 a day, Disp: 100 each, Rfl: 12 .  hydrochlorothiazide (HYDRODIURIL) 25 MG tablet, TAKE 1 TABLET (25 MG TOTAL) BY MOUTH DAILY., Disp: 90 tablet, Rfl: 3 .  JARDIANCE 25 MG TABS tablet, TAKE 1 TABLET EVERY DAY, Disp: 90 tablet, Rfl: 1 .  losartan (COZAAR) 50 MG tablet, Take 1 tablet (50 mg total) by mouth daily.,  Disp: 90 tablet, Rfl: 4 .  Magnesium 250 MG TABS, Take by mouth daily. , Disp: , Rfl:  .  metFORMIN (GLUCOPHAGE) 500 MG tablet, TAKE 2 TABLETS TWICE DAILY WITH MEALS, Disp: 360 tablet, Rfl: 3 .  minoxidil (ROGAINE) 2 % external solution, Apply 1 application topically daily., Disp: 60 mL, Rfl: 10 .  OXYGEN-HELIUM IN, Inhale into the lungs., Disp: , Rfl:  .  Semaglutide,0.25 or 0.5MG/DOS, (OZEMPIC, 0.25 OR 0.5 MG/DOSE,) 2 MG/1.5ML SOPN, Inject 0.5 mg into the skin once a week., Disp: 2 pen, Rfl: 5 .  TRUEPLUS LANCETS 33G MISC, Use as instructed two times daily, Disp: 100 each, Rfl: 12  EXAM:  VITALS per patient if applicable:  GENERAL: alert, oriented, appears well and in no acute distress  HEENT: atraumatic, conjunctiva clear, no obvious abnormalities on inspection of external nose and ears  NECK: normal movements of the head and neck  LUNGS: on inspection no signs of respiratory distress, breathing rate appears normal, no obvious gross SOB, gasping or wheezing  CV: no obvious cyanosis  MS: moves all visible extremities without noticeable abnormality  PSYCH/NEURO: pleasant and cooperative, no obvious depression or anxiety, speech and thought processing grossly intact  ASSESSMENT AND PLAN:  Discussed the following assessment and plan:  Arthritis -consider tylenol arthritis strength, heat, OTC topical analgesics/antiinflammatory agents  Essential hypertension -pt encourage to check bp at home -continue lifestyle modifications - Plan: losartan (COZAAR) 50 MG tablet  Muscle cramps -discussed staying hydrated and replacing electrolytes -consider yellow mustard or turmeric  Lumbar pain -continue lidocaine patches prn -discussed proper body mechanics -consider tylenol arthritis strength  F/u prn in the next few months for CPE.  I discussed the assessment and treatment plan with the patient. The patient was provided an opportunity to ask questions and all were answered. The  patient agreed with the plan and demonstrated an understanding of the instructions.   The patient was advised to call back or seek an in-person evaluation if the symptoms worsen or if the condition fails to improve as anticipated.   Billie Ruddy, MD

## 2019-04-23 ENCOUNTER — Other Ambulatory Visit: Payer: Self-pay

## 2019-04-25 ENCOUNTER — Ambulatory Visit (INDEPENDENT_AMBULATORY_CARE_PROVIDER_SITE_OTHER): Payer: Medicare HMO | Admitting: Internal Medicine

## 2019-04-25 ENCOUNTER — Encounter: Payer: Self-pay | Admitting: Internal Medicine

## 2019-04-25 ENCOUNTER — Other Ambulatory Visit: Payer: Self-pay

## 2019-04-25 VITALS — BP 140/82 | HR 84 | Ht 70.0 in | Wt 221.0 lb

## 2019-04-25 DIAGNOSIS — E782 Mixed hyperlipidemia: Secondary | ICD-10-CM

## 2019-04-25 DIAGNOSIS — E042 Nontoxic multinodular goiter: Secondary | ICD-10-CM | POA: Diagnosis not present

## 2019-04-25 DIAGNOSIS — E1165 Type 2 diabetes mellitus with hyperglycemia: Secondary | ICD-10-CM | POA: Diagnosis not present

## 2019-04-25 DIAGNOSIS — Z23 Encounter for immunization: Secondary | ICD-10-CM

## 2019-04-25 LAB — POCT GLYCOSYLATED HEMOGLOBIN (HGB A1C): Hemoglobin A1C: 6.9 % — AB (ref 4.0–5.6)

## 2019-04-25 NOTE — Progress Notes (Signed)
Patient ID: Robert Cordova, male   DOB: 24-Feb-1951, 68 y.o.   MRN: [id number]   HPI: Robert Cordova is a 68 y.o.-year-old male, returning for f/u for DM2, dx in 2013, non-insulin-dependent, uncontrolled, with complications (h/o DKA). Last visit 7 months ago.  Last hemoglobin A1c was: Lab Results  Component Value Date   HGBA1C 7.1 (A) 09/27/2018   HGBA1C 8.0 (H) 05/07/2018   HGBA1C 6.6 (A) 01/25/2018  08/22/2016: 7.7%  He is on: - Metformin 1000 mg 2x a day, with meals >> 2000 mg with dinner  - Glipizide 10 mg after dinner - Jardiance 25 mg before b'fast - Ozempic 0.5 mg weekly -added 09/2018 Of note, He was off and on insulin in the past. He came off when he lost weight after his lab band surgery in 2014 (45 pounds)  He is checking sugars 1-2 times a day - am:115-179, 213 >> 118-191, 220 >> 129, 152-212 >> 118-178, 192 - 2h after b'fast: 87-125, 147 >> n/c - before lunch:74-132 >> 108-151 >> 98-142 >> 117, 131 - 2h after lunch: 100, 153 >> n/c >> 118-215 >> n/c - before dinner:131 >> n/c >> 90-124 >> n/c >> 125 - 2h after dinner:328 (cheese cake) >> n/c >> 208 - bedtime:  79-153, 201, 253 >> 121-313 >> 148-216, 249 >> 141-210, 234 - nighttime: n/c >> 212 >> n/c  Lowest sugar was 98 >> 117; he has hypoglycemia awareness in the 70s. Highest sugar was 249 >> 234.  Pt's meals are: - Breakfast: Protein shake - Lunch: Eggs, bacon or leftovers - Dinner: Meat + veggies + starch, pizza, [place] food - Snacks: In the evening: cookies, etc.  -No CKD, last BUN/creatinine:  Lab Results  Component Value Date   BUN 21 05/07/2018   BUN 19 01/25/2018   CREATININE 0.77 05/07/2018   CREATININE 0.78 01/25/2018  On losartan 25 mg twice daily. -+ HL; last set of lipids: Lab Results  Component Value Date   CHOL 141 05/07/2018   HDL 35.20 (L) 05/07/2018   LDLCALC 51 01/25/2018   LDLDIRECT 72.0 05/07/2018   TRIG 268.0 (H) 05/07/2018   CHOLHDL 4 05/07/2018  On Lipitor, co-Q10,  Krill oil - last eye exam was on 01/2018: No DR, + small cataract.[facility]. -He denies numbness and tingling in his feet.  He has a history of thyroid nodules-S/P 2 benign FNAs in 2014.  Latest TSH was normal: Lab Results  Component Value Date   TSH 0.74 05/07/2018   Reviewed latest thyroid ultrasound report: Thyroid U/S (10/20/2016): Stable 2.8 cm solid nodule in the inferior right lobe.  This was previously sampled with negative biopsy.  The 1.8 cm nodule previously sampled in the right isthmus was no longer identified by ultrasound.  He also has sleep-related hypoxia, HTN.  He has Medicare with 2 supplemental insurances.  ROS: Constitutional: no weight gain/no weight loss, no fatigue, no subjective hyperthermia, no subjective hypothermia Eyes: no blurry vision, no xerophthalmia ENT: no sore throat, no nodules palpated in neck, no dysphagia, no odynophagia, no hoarseness Cardiovascular: no CP/no SOB/no palpitations/no leg swelling Respiratory: no cough/no SOB/no wheezing Gastrointestinal: no N/no V/no D/no C/no acid reflux Musculoskeletal: no muscle aches/+ joint aches Skin: no rashes, no hair loss Neurological: + tremors/no numbness/no tingling/no dizziness  I reviewed pt's medications, allergies, PMH, social hx, family hx, and changes were documented in the history of present illness. Otherwise, unchanged from my initial visit note.  Past Medical History:  Diagnosis Date  . ABNORMAL GLUCOSE  NEC 03/18/2007  . Arthritis    fingers  . BLEPHARITIS, LEFT 10/29/2009  . DIABETES-TYPE 2 04/18/2010  . Dry throat   . Environmental allergies   . GERD (gastroesophageal reflux disease)   . H/O hiatal hernia   . HYPERLIPIDEMIA 10/22/2007  . HYPERTENSION 03/18/2007  . Multiple thyroid nodules    "biopsy done, everything was fine" being monitored  . NEVUS, MELANOCYTIC, FACE 10/07/2009   Past Surgical History:  Procedure Laterality Date  . BIOPSY THYROID  ~08/2013  . BREATH  TEK H PYLORI N/A 09/05/2013   Procedure: BREATH TEK H PYLORI;  Surgeon: [name], MD;  Location: [facility];  Service: General;  Laterality: N/A;  . COLONOSCOPY  2009, 2013   Dr. Sharlett Iles; multiple polyps  . HIATAL HERNIA REPAIR  11/25/2013   Procedure: LAPAROSCOPIC REPAIR OF HIATAL HERNIA;  Surgeon: [name], MD;  Location: [facility];  Service: General;;  . LAPAROSCOPIC GASTRIC BANDING N/A 11/25/2013   Procedure: LAPAROSCOPIC GASTRIC BANDING;  Surgeon: [name], MD;  Location: [facility];  Service: General;  Laterality: N/A;  . NO PAST SURGERIES     Social History   Social History  . Marital status: Married    Spouse name: N/A  . Number of children: 2   Occupational History  . 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 Smoking status: Never Smoker  . Smokeless tobacco: Never Used  . Alcohol use No  . Drug use: No   Current Outpatient Medications on File Prior to Visit  Medication Sig Dispense Refill  . aspirin 81 MG tablet Take 1 tablet (81 mg total) by mouth daily. 30 tablet 5  . atorvastatin (LIPITOR) 20 MG tablet Take 1 tablet (20 mg total) by mouth daily. 90 tablet 4  . Blood Glucose Monitoring Suppl (TRUE METRIX AIR GLUCOSE METER) w/Device KIT 1 Device by Other route as directed. 1 kit 0  . Cholecalciferol (VITAMIN D PO) Take 50 mcg by mouth daily.    . Coenzyme Q10 (CO Q-10) 200 MG CAPS Take by mouth daily.    . fluticasone (FLONASE) 50 MCG/ACT nasal spray Place 2 sprays into both nostrils daily. 48 g 11  . glipiZIDE (GLUCOTROL) 5 MG tablet TAKE 1 TABLET TWICE DAILY BEFORE MEALS 180 tablet 3  . glucose blood (ONETOUCH VERIO) test strip Use as instructed x 1 a day 100 each 12  . hydrochlorothiazide (HYDRODIURIL) 25 MG tablet TAKE 1 TABLET (25 MG TOTAL) BY MOUTH DAILY. 90 tablet 3  . JARDIANCE 25 MG TABS tablet TAKE 1 TABLET EVERY DAY 90 tablet 1  . losartan (COZAAR) 50 MG tablet Take 1 tablet (50 mg total) by mouth daily. 90  tablet 4  . Magnesium 250 MG TABS Take by mouth daily.     . metFORMIN (GLUCOPHAGE) 500 MG tablet TAKE 2 TABLETS TWICE DAILY WITH MEALS 360 tablet 3  . minoxidil (ROGAINE) 2 % external solution Apply 1 application topically daily. 60 mL 10  . OXYGEN-HELIUM IN Inhale into the lungs.    . Semaglutide,0.25 or 0.5MG/DOS, (OZEMPIC, 0.25 OR 0.5 MG/DOSE,) 2 MG/1.5ML SOPN Inject 0.5 mg into the skin once a week. 2 pen 5  . TRUEPLUS LANCETS 33G MISC Use as instructed two times daily 100 each 12   No current facility-administered medications on file prior to visit.    Allergies  Allergen Reactions  . Ace Inhibitors     REACTION: Coughing   Family History  Problem Relation Age  of Onset  . Hyperlipidemia Mother   . Hypertension Mother   . Hyperlipidemia Father   . Hypertension Father   . Heart attack Father   . Hyperlipidemia Brother   . Diabetes Maternal Grandmother   . Diabetes Maternal Grandfather   . Colon cancer Neg Hx   . Stomach cancer Neg Hx   . Esophageal cancer Neg Hx   . Rectal cancer Neg Hx     PE: BP 140/82   Pulse 84   Ht '5\' 10"'$  (1.778 m)   Wt 221 lb (100.2 kg)   SpO2 95%   BMI 31.71 kg/m  Wt Readings from Last 3 Encounters:  04/25/19 221 lb (100.2 kg)  09/27/18 225 lb (102.1 kg)  08/12/18 224 lb (101.6 kg)   Constitutional: overweight, in NAD Eyes: PERRLA, EOMI, no exophthalmos ENT: moist mucous membranes, no thyromegaly, no cervical lymphadenopathy Cardiovascular: RRR, No MRG Respiratory: CTA B Gastrointestinal: abdomen soft, NT, ND, BS+ Musculoskeletal: no deformities, strength intact in all 4 Skin: moist, warm, no rashes Neurological: + tremor (chronic, essential) with outstretched hands, DTR normal in all 4  ASSESSMENT: 1. DM2, non-insulin-dependent, uncontrolled, without long term complications, but with hyperglycemia - h/o DKA  2.  Multinodular goiter  3. HL  PLAN:  1. Patient with longstanding, uncontrolled, type 2 diabetes, on oral  antidiabetic regimen and weekly GLP-1 receptor agonist added at last visit.  At last visit, sugars were slightly better but they were still above goal.  They were high after dinner and in the morning and were improving to target during the day.  We discussed about stopping snacking at night but we decided to go ahead and start a GLP-1 receptor agonist and I also advised him to decrease his metformin to only 2000 mg with dinner.  He is tolerating Ozempic very well.  This is expensive for him but he would like to continue to use it. -At this visit, sugars have improved at all times of the day, but he still has sugars above target in the morning.  Also, at bedtime, he frequently has sugars higher than target, even in the 200s, depending on his meals.  Upon questioning, he is taking the glipizide tablet after dinner, rather than before dinner.  We discussed about moving this before dinner.  I advised him to let me know in 3 to 4 weeks how his sugars are doing and if they are still higher than goal, we may need to increase Ozempic to 1 mg weekly.  For now, he has 4 pens left so we will continue the lower dose. - I suggested to:  Patient Instructions  Please continue:  - Metformin 2000 mg with dinner - Glipizide 10 mg but move this before dinner - Jardiance 25 mg before b'fast - Ozempic 0.5 mg weekly  Please let me know if sugars are still high in ~3-4 weeks.  Please return in 4 months with your sugar log  - we checked his HbA1c: 6.9% (better) - advised to check sugars at different times of the day - 1-2x a day, rotating check times - advised for yearly eye exams >> he is not UTD - return to clinic in 4 months   2. Thyroid nodules and goiter -He has a history of 2 thyroid nodules that have been evaluated in the past by FNA 2014 with benign results -Most recent thyroid ultrasound from 2018 shows stable nodules -Reviewed most recent TSH from 05/2018 and this was normal -No neck compression  symptoms -No  intervention needed for now  3. HL - Reviewed latest lipid panel from 05/2018: LDL at goal, triglycerides high, HDL low Lab Results  Component Value Date   CHOL 141 05/07/2018   HDL 35.20 (L) 05/07/2018   LDLCALC 51 01/25/2018   LDLDIRECT 72.0 05/07/2018   TRIG 268.0 (H) 05/07/2018   CHOLHDL 4 05/07/2018  - Continues Lipitor, co-Q10, Krill oil without side effects.  Philemon Kingdom, MD PhD [facility] Endocrin[facility]

## 2019-04-25 NOTE — Patient Instructions (Signed)
Please continue:  - Metformin 2000 mg with dinner - Glipizide 10 mg but move this before dinner - Jardiance 25 mg before b'fast - Ozempic 0.5 mg weekly  Please let me know if sugars are still high in ~3-4 weeks.  Please return in 4 months with your sugar log

## 2019-05-08 DIAGNOSIS — J9610 Chronic respiratory failure, unspecified whether with hypoxia or hypercapnia: Secondary | ICD-10-CM | POA: Diagnosis not present

## 2019-06-04 ENCOUNTER — Other Ambulatory Visit: Payer: Self-pay

## 2019-06-04 ENCOUNTER — Telehealth: Payer: Self-pay | Admitting: Family Medicine

## 2019-06-04 DIAGNOSIS — Z4651 Encounter for fitting and adjustment of gastric lap band: Secondary | ICD-10-CM | POA: Diagnosis not present

## 2019-06-04 DIAGNOSIS — E785 Hyperlipidemia, unspecified: Secondary | ICD-10-CM

## 2019-06-04 MED ORDER — ATORVASTATIN CALCIUM 20 MG PO TABS: 20.0000 mg | ORAL_TABLET | Freq: Every day | ORAL | 4 refills | Status: DC

## 2019-06-04 NOTE — Telephone Encounter (Signed)
rx refill atorvastatin (LIPITOR) 20 MG tablet [facility] [place], [place] [phone] (Phone) [phone] (Fax)

## 2019-06-04 NOTE — Telephone Encounter (Signed)
Rx sent 

## 2019-06-08 DIAGNOSIS — J9610 Chronic respiratory failure, unspecified whether with hypoxia or hypercapnia: Secondary | ICD-10-CM | POA: Diagnosis not present

## 2019-06-27 ENCOUNTER — Other Ambulatory Visit: Payer: Self-pay

## 2019-07-08 DIAGNOSIS — J9610 Chronic respiratory failure, unspecified whether with hypoxia or hypercapnia: Secondary | ICD-10-CM | POA: Diagnosis not present

## 2019-07-23 ENCOUNTER — Other Ambulatory Visit: Payer: Self-pay

## 2019-07-23 ENCOUNTER — Encounter: Payer: Self-pay | Admitting: Family Medicine

## 2019-07-23 ENCOUNTER — Ambulatory Visit (INDEPENDENT_AMBULATORY_CARE_PROVIDER_SITE_OTHER): Payer: Medicare HMO | Admitting: Family Medicine

## 2019-07-23 VITALS — BP 122/76 | HR 64 | Temp 97.4°F | Wt 222.0 lb

## 2019-07-23 DIAGNOSIS — M545 Low back pain, unspecified: Secondary | ICD-10-CM

## 2019-07-23 DIAGNOSIS — Z0000 Encounter for general adult medical examination without abnormal findings: Secondary | ICD-10-CM | POA: Diagnosis not present

## 2019-07-23 DIAGNOSIS — E782 Mixed hyperlipidemia: Secondary | ICD-10-CM

## 2019-07-23 DIAGNOSIS — E1165 Type 2 diabetes mellitus with hyperglycemia: Secondary | ICD-10-CM | POA: Diagnosis not present

## 2019-07-23 DIAGNOSIS — I10 Essential (primary) hypertension: Secondary | ICD-10-CM

## 2019-07-23 DIAGNOSIS — R350 Frequency of micturition: Secondary | ICD-10-CM | POA: Diagnosis not present

## 2019-07-23 LAB — CBC WITH DIFFERENTIAL/PLATELET
Basophils Absolute: 0.1 10*3/uL (ref 0.0–0.1)
Basophils Relative: 0.9 % (ref 0.0–3.0)
Eosinophils Absolute: 0.1 10*3/uL (ref 0.0–0.7)
Eosinophils Relative: 1.9 % (ref 0.0–5.0)
HCT: 42.8 % (ref 39.0–52.0)
Hemoglobin: 14.4 g/dL (ref 13.0–17.0)
Lymphocytes Relative: 24.9 % (ref 12.0–46.0)
Lymphs Abs: 1.5 10*3/uL (ref 0.7–4.0)
MCHC: 33.6 g/dL (ref 30.0–36.0)
MCV: 93.4 fl (ref 78.0–100.0)
Monocytes Absolute: 0.5 10*3/uL (ref 0.1–1.0)
Monocytes Relative: 9 % (ref 3.0–12.0)
Neutro Abs: 3.7 10*3/uL (ref 1.4–7.7)
Neutrophils Relative %: 63.3 % (ref 43.0–77.0)
Platelets: 300 10*3/uL (ref 150.0–400.0)
RBC: 4.58 Mil/uL (ref 4.22–5.81)
RDW: 12.7 % (ref 11.5–15.5)
WBC: 5.9 10*3/uL (ref 4.0–10.5)

## 2019-07-23 LAB — BASIC METABOLIC PANEL
BUN: 14 mg/dL (ref 6–23)
CO2: 25 mEq/L (ref 19–32)
Calcium: 9.8 mg/dL (ref 8.4–10.5)
Chloride: 103 mEq/L (ref 96–112)
Creatinine, Ser: 0.65 mg/dL (ref 0.40–1.50)
GFR: 121.4 mL/min (ref >60.00)
Glucose, Bld: 122 mg/dL — ABNORMAL HIGH (ref 70–99)
Potassium: 4.3 mEq/L (ref 3.5–5.1)
Sodium: 138 mEq/L (ref 135–145)

## 2019-07-23 LAB — POCT URINALYSIS DIPSTICK
Glucose, UA: POSITIVE — AB
Leukocytes, UA: NEGATIVE
Protein, UA: NEGATIVE
Spec Grav, UA: 1.025 (ref 1.010–1.025)
Urobilinogen, UA: 0.2 E.U./dL
pH, UA: 5.5 (ref 5.0–8.0)

## 2019-07-23 LAB — LIPID PANEL
Cholesterol: 118 mg/dL (ref 0–200)
HDL: 36.9 mg/dL — ABNORMAL LOW (ref >39.00)
LDL Cholesterol: 48 mg/dL (ref 0–99)
NonHDL: 80.75
Total CHOL/HDL Ratio: 3
Triglycerides: 163 mg/dL — ABNORMAL HIGH (ref 0.0–149.0)
VLDL: 32.6 mg/dL (ref 0.0–40.0)

## 2019-07-23 LAB — HEMOGLOBIN A1C: Hgb A1c MFr Bld: 7.5 % — ABNORMAL HIGH (ref 4.6–6.5)

## 2019-07-23 LAB — PSA: PSA: 2.19 ng/mL (ref 0.10–4.00)

## 2019-07-23 NOTE — Patient Instructions (Signed)
Preventive Care 70 Years and Older, Male Preventive care refers to lifestyle choices and visits with your health care provider that can promote health and wellness. This includes:  A yearly physical exam. This is also called an annual well check.  Regular dental and eye exams.  Immunizations.  Screening for certain conditions.  Healthy lifestyle choices, such as diet and exercise. What can I expect for my preventive care visit? Physical exam Your health care provider will check:  Height and weight. These may be used to calculate body mass index (BMI), which is a measurement that tells if you are at a healthy weight.  Heart rate and blood pressure.  Your skin for abnormal spots. Counseling Your health care provider may ask you questions about:  Alcohol, tobacco, and drug use.  Emotional well-being.  Home and relationship well-being.  Sexual activity.  Eating habits.  History of falls.  Memory and ability to understand (cognition).  Work and work Statistician. What immunizations do I need?  Influenza (flu) vaccine  This is recommended every year. Tetanus, diphtheria, and pertussis (Tdap) vaccine  You may need a Td booster every 10 years. Varicella (chickenpox) vaccine  You may need this vaccine if you have not already been vaccinated. Zoster (shingles) vaccine  You may need this after age 70. Pneumococcal conjugate (PCV13) vaccine  One dose is recommended after age 40. Pneumococcal polysaccharide (PPSV23) vaccine  One dose is recommended after age 24. Measles, mumps, and rubella (MMR) vaccine  You may need at least one dose of MMR if you were born in 1957 or later. You may also need a second dose. Meningococcal conjugate (MenACWY) vaccine  You may need this if you have certain conditions. Hepatitis A vaccine  You may need this if you have certain conditions or if you travel or work in places where you may be exposed to hepatitis A. Hepatitis B  vaccine  You may need this if you have certain conditions or if you travel or work in places where you may be exposed to hepatitis B. Haemophilus influenzae type b (Hib) vaccine  You may need this if you have certain conditions. You may receive vaccines as individual doses or as more than one vaccine together in one shot (combination vaccines). Talk with your health care provider about the risks and benefits of combination vaccines. What tests do I need? Blood tests  Lipid and cholesterol levels. These may be checked every 5 years, or more frequently depending on your overall health.  Hepatitis C test.  Hepatitis B test. Screening  Lung cancer screening. You may have this screening every year starting at age 67 if you have a 30-pack-year history of smoking and currently smoke or have quit within the past 15 years.  Colorectal cancer screening. All adults should have this screening starting at age 77 and continuing until age 8. Your health care provider may recommend screening at age 74 if you are at increased risk. You will have tests every 1-10 years, depending on your results and the type of screening test.  Prostate cancer screening. Recommendations will vary depending on your family history and other risks.  Diabetes screening. This is done by checking your blood sugar (glucose) after you have not eaten for a while (fasting). You may have this done every 1-3 years.  Abdominal aortic aneurysm (AAA) screening. You may need this if you are a current or former smoker.  Sexually transmitted disease (STD) testing. Follow these instructions at home: Eating and drinking  Eat  a diet that includes fresh fruits and vegetables, whole grains, lean protein, and low-fat dairy products. Limit your intake of foods with high amounts of sugar, saturated fats, and salt.  Take vitamin and mineral supplements as recommended by your health care provider.  Do not drink alcohol if your health care  provider tells you not to drink.  If you drink alcohol: ? Limit how much you have to 0-2 drinks a day. ? Be aware of how much alcohol is in your drink. In the [place] one drink equals one 12 oz bottle of beer (355 mL), one 5 oz glass of wine (148 mL), or one 1 oz glass of hard liquor (44 mL). Lifestyle  Take daily care of your teeth and gums.  Stay active. Exercise for at least 30 minutes on 5 or more days each week.  Do not use any products that contain nicotine or tobacco, such as cigarettes, e-cigarettes, and chewing tobacco. If you need help quitting, ask your health care provider.  If you are sexually active, practice safe sex. Use a condom or other form of protection to prevent STIs (sexually transmitted infections).  Talk with your health care provider about taking a low-dose aspirin or statin. What's next?  Visit your health care provider once a year for a well check visit.  Ask your health care provider how often you should have your eyes and teeth checked.  Stay up to date on all vaccines. This information is not intended to replace advice given to you by your health care provider. Make sure you discuss any questions you have with your health care provider. Document Released: 08/20/2015 Document Revised: 07/18/2018 Document Reviewed: 07/18/2018 Elsevier Patient Education  2020 [employer].  Managing Your Hypertension Hypertension is commonly called high blood pressure. This is when the force of your blood pressing against the walls of your arteries is too strong. Arteries are blood vessels that carry blood from your heart throughout your body. Hypertension forces the heart to work harder to pump blood, and may cause the arteries to become narrow or stiff. Having untreated or uncontrolled hypertension can cause heart attack, stroke, kidney disease, and other problems. What are blood pressure readings? A blood pressure reading consists of a higher number over a lower number.  Ideally, your blood pressure should be below 120/80. The first ("top") number is called the systolic pressure. It is a measure of the pressure in your arteries as your heart beats. The second ("bottom") number is called the diastolic pressure. It is a measure of the pressure in your arteries as the heart relaxes. What does my blood pressure reading mean? Blood pressure is classified into four stages. Based on your blood pressure reading, your health care provider may use the following stages to determine what type of treatment you need, if any. Systolic pressure and diastolic pressure are measured in a unit called mm Hg. Normal  Systolic pressure: below 832.  Diastolic pressure: below 80. Elevated  Systolic pressure: 549-826.  Diastolic pressure: below 80. Hypertension stage 1  Systolic pressure: 415-830.  Diastolic pressure: 94-07. Hypertension stage 2  Systolic pressure: 680 or above.  Diastolic pressure: 90 or above. What health risks are associated with hypertension? Managing your hypertension is an important responsibility. Uncontrolled hypertension can lead to:  A heart attack.  A stroke.  A weakened blood vessel (aneurysm).  Heart failure.  Kidney damage.  Eye damage.  Metabolic syndrome.  Memory and concentration problems. What changes can I make to manage my  hypertension? Hypertension can be managed by making lifestyle changes and possibly by taking medicines. Your health care provider will help you make a plan to bring your blood pressure within a normal range. Eating and drinking   Eat a diet that is high in fiber and potassium, and low in salt (sodium), added sugar, and fat. An example eating plan is called the DASH (Dietary Approaches to Stop Hypertension) diet. To eat this way: ? Eat plenty of fresh fruits and vegetables. Try to fill half of your plate at each meal with fruits and vegetables. ? Eat whole grains, such as whole wheat pasta, brown rice, or  whole grain bread. Fill about one quarter of your plate with whole grains. ? Eat low-fat diary products. ? Avoid fatty cuts of meat, processed or cured meats, and poultry with skin. Fill about one quarter of your plate with lean proteins such as fish, chicken without skin, beans, eggs, and tofu. ? Avoid premade and processed foods. These tend to be higher in sodium, added sugar, and fat.  Reduce your daily sodium intake. Most people with hypertension should eat less than 1,500 mg of sodium a day.  Limit alcohol intake to no more than 1 drink a day for nonpregnant women and 2 drinks a day for men. One drink equals 12 oz of beer, 5 oz of wine, or 1 oz of hard liquor. Lifestyle  Work with your health care provider to maintain a healthy body weight, or to lose weight. Ask what an ideal weight is for you.  Get at least 30 minutes of exercise that causes your heart to beat faster (aerobic exercise) most days of the week. Activities may include walking, swimming, or biking.  Include exercise to strengthen your muscles (resistance exercise), such as weight lifting, as part of your weekly exercise routine. Try to do these types of exercises for 30 minutes at least 3 days a week.  Do not use any products that contain nicotine or tobacco, such as cigarettes and e-cigarettes. If you need help quitting, ask your health care provider.  Control any long-term (chronic) conditions you have, such as high cholesterol or diabetes. Monitoring  Monitor your blood pressure at home as told by your health care provider. Your personal target blood pressure may vary depending on your medical conditions, your age, and other factors.  Have your blood pressure checked regularly, as often as told by your health care provider. Working with your health care provider  Review all the medicines you take with your health care provider because there may be side effects or interactions.  Talk with your health care provider  about your diet, exercise habits, and other lifestyle factors that may be contributing to hypertension.  Visit your health care provider regularly. Your health care provider can help you create and adjust your plan for managing hypertension. Will I need medicine to control my blood pressure? Your health care provider may prescribe medicine if lifestyle changes are not enough to get your blood pressure under control, and if:  Your systolic blood pressure is 130 or higher.  Your diastolic blood pressure is 80 or higher. Take medicines only as told by your health care provider. Follow the directions carefully. Blood pressure medicines must be taken as prescribed. The medicine does not work as well when you skip doses. Skipping doses also puts you at risk for problems. Contact a health care provider if:  You think you are having a reaction to medicines you have taken.  You  have repeated (recurrent) headaches.  You feel dizzy.  You have swelling in your ankles.  You have trouble with your vision. Get help right away if:  You develop a severe headache or confusion.  You have unusual weakness or numbness, or you feel faint.  You have severe pain in your chest or abdomen.  You vomit repeatedly.  You have trouble breathing. Summary  Hypertension is when the force of blood pumping through your arteries is too strong. If this condition is not controlled, it may put you at risk for serious complications.  Your personal target blood pressure may vary depending on your medical conditions, your age, and other factors. For most people, a normal blood pressure is less than 120/80.  Hypertension is managed by lifestyle changes, medicines, or both. Lifestyle changes include weight loss, eating a healthy, low-sodium diet, exercising more, and limiting alcohol. This information is not intended to replace advice given to you by your health care provider. Make sure you discuss any questions you have  with your health care provider. Document Released: 04/17/2012 Document Revised: 11/15/2018 Document Reviewed: 06/21/2016 Elsevier Patient Education  [place].  Chronic Back Pain When back pain lasts longer than 3 months, it is called chronic back pain.The cause of your back pain may not be known. Some common causes include:  Wear and tear (degenerative disease) of the bones, ligaments, or disks in your back.  Inflammation and stiffness in your back (arthritis). People who have chronic back pain often go through certain periods in which the pain is more intense (flare-ups). Many people can learn to manage the pain with home care. Follow these instructions at home: Pay attention to any changes in your symptoms. Take these actions to help with your pain: Activity   Avoid bending and other activities that make the problem worse.  Maintain a proper position when standing or sitting: ? When standing, keep your upper back and neck straight, with your shoulders pulled back. Avoid slouching. ? When sitting, keep your back straight and relax your shoulders. Do not round your shoulders or pull them backward.  Do not sit or stand in one place for long periods of time.  Take brief periods of rest throughout the day. This will reduce your pain. Resting in a lying or standing position is usually better than sitting to rest.  When you are resting for longer periods, mix in some mild activity or stretching between periods of rest. This will help to prevent stiffness and pain.  Get regular exercise. Ask your health care provider what activities are safe for you.  Do not lift anything that is heavier than 10 lb (4.5 kg). Always use proper lifting technique, which includes: ? Bending your knees. ? Keeping the load close to your body. ? Avoiding twisting.  Sleep on a firm mattress in a comfortable position. Try lying on your side with your knees slightly bent. If you lie on your back, put a  pillow under your knees. Managing pain  If directed, apply ice to the painful area. Your health care provider may recommend applying ice during the first 24-48 hours after a flare-up begins. ? Put ice in a plastic bag. ? Place a towel between your skin and the bag. ? Leave the ice on for 20 minutes, 2-3 times per day.  If directed, apply heat to the affected area as often as told by your health care provider. Use the heat source that your health care provider recommends, such as a  moist heat pack or a heating pad. ? Place a towel between your skin and the heat source. ? Leave the heat on for 20-30 minutes. ? Remove the heat if your skin turns bright red. This is especially important if you are unable to feel pain, heat, or cold. You may have a greater risk of getting burned.  Try soaking in a warm tub.  Take over-the-counter and prescription medicines only as told by your health care provider.  Keep all follow-up visits as told by your health care provider. This is important. Contact a health care provider if:  You have pain that is not relieved with rest or medicine. Get help right away if:  You have weakness or numbness in one or both of your legs or feet.  You have trouble controlling your bladder or your bowels.  You have nausea or vomiting.  You have pain in your abdomen.  You have shortness of breath or you faint. This information is not intended to replace advice given to you by your health care provider. Make sure you discuss any questions you have with your health care provider. Document Released: 08/31/2004 Document Revised: 11/14/2018 Document Reviewed: 01/31/2017 Elsevier Patient Education  2020 [place], Adult     A hernia happens when tissue inside your body pushes out through a weak spot in your belly muscles (abdominal wall). This makes a round lump (bulge). The lump may be:  In a scar from surgery that was done in your belly (incisional  hernia).  Near your belly button (umbilical hernia).  In your groin (inguinal hernia). Your groin is the area where your leg meets your lower belly (abdomen). This kind of hernia could also be: ? In your scrotum, if you are male. ? In folds of skin around your vagina, if you are male.  In your upper thigh (femoral hernia).  Inside your belly (hiatal hernia). This happens when your stomach slides above the muscle between your belly and your chest (diaphragm). If your hernia is small and it does not cause pain, you may not need treatment. If your hernia is large or it causes pain, you may need surgery. Follow these instructions at home: Activity  Avoid stretching or overusing (straining) the muscles near your hernia. Straining can happen when you: ? Lift something heavy. ? Poop (have a bowel movement).  Do not lift anything that is heavier than 10 lb (4.5 kg), or the limit that you are told, until your doctor says that it is safe.  Use the strength of your legs when you lift something heavy. Do not use only your back muscles to lift. General instructions  Do these things if told by your doctor so you do not have trouble pooping (constipation): ? Drink enough fluid to keep your pee (urine) pale yellow. ? Eat foods that are high in fiber. These include fresh fruits and vegetables, whole grains, and beans. ? Limit foods that are high in fat and processed sugars. These include foods that are fried or sweet. ? Take medicine for trouble pooping.  When you cough, try to cough gently.  You may try to push your hernia in by very gently pressing on it when you are lying down. Do not try to force the bulge back in if it will not push in easily.  If you are overweight, work with your doctor to lose weight safely.  Do not use any products that have nicotine or tobacco in them. These include cigarettes  and e-cigarettes. If you need help quitting, ask your doctor.  If you will be having  surgery (hernia repair), watch your hernia for changes in shape, size, or color. Tell your doctor if you see any changes.  Take over-the-counter and prescription medicines only as told by your doctor.  Keep all follow-up visits as told by your doctor. Contact a doctor if:  You get new pain, swelling, or redness near your hernia.  You poop fewer times in a week than normal.  You have trouble pooping.  You have poop (stool) that is more dry than normal.  You have poop that is harder or larger than normal. Get help right away if:  You have a fever.  You have belly pain that gets worse.  You feel sick to your stomach (nauseous).  You throw up (vomit).  Your hernia cannot be pushed in by very gently pressing on it when you are lying down. Do not try to force the bulge back in if it will not push in easily.  Your hernia: ? Changes in shape or size. ? Changes color. ? Feels hard or it hurts when you touch it. These symptoms may represent a serious problem that is an emergency. Do not wait to see if the symptoms will go away. Get medical help right away. Call your local emergency services (911 in the [place] Summary  A hernia happens when tissue inside your body pushes out through a weak spot in the belly muscles. This creates a bulge.  If your hernia is small and it does not hurt, you may not need treatment. If your hernia is large or it hurts, you may need surgery.  If you will be having surgery, watch your hernia for changes in shape, size, or color. Tell your doctor about any changes. This information is not intended to replace advice given to you by your health care provider. Make sure you discuss any questions you have with your health care provider. Document Released: 01/11/2010 Document Revised: 11/14/2018 Document Reviewed: 04/25/2017 Elsevier Patient Education  2020 [employer].

## 2019-07-23 NOTE — Progress Notes (Signed)
Subjective:     Robert Cordova is a 68 y.o. male and is here for a comprehensive physical exam. The patient reports problems - urinary frequency, back pain. Pt with nocturia, up 3-4 x per night.  Also notes frequency/urgency during the day.  Endorses drinking lots of fluids including several cups of decaffeinated tea.  States fsbs has been ok.  Also notes increased thirst but attributes it to meds.  Pt also with low back pain.  Notices with increased sitting, started 2 months ago.  OTC lidocaine patches help.  Denies fever, chills, n/v, pain that moves.   Social History   Socioeconomic History  . Marital status: Married    Spouse name: Not on file  . Number of children: Not on file  . Years of education: Not on file  . Highest education level: Not on file  Occupational History  . Occupation: Optometrist-- self-employeed  Tobacco Use  . Smoking status: Never Smoker  . Smokeless tobacco: Never Used  Substance and Sexual Activity  . Alcohol use: No  . Drug use: No  . Sexual activity: Not on file  Other Topics Concern  . Not on file  Social History Narrative  . Not on file   Social Determinants of Health   Financial Resource Strain:   . Difficulty of Paying Living Expenses: Not on file  Food Insecurity:   . Worried About Charity fundraiser in the Last Year: Not on file  . Ran Out of Food in the Last Year: Not on file  Transportation Needs:   . Lack of Transportation (Medical): Not on file  . Lack of Transportation (Non-Medical): Not on file  Physical Activity:   . Days of Exercise per Week: Not on file  . Minutes of Exercise per Session: Not on file  Stress:   . Feeling of Stress : Not on file  Social Connections:   . Frequency of Communication with Friends and Family: Not on file  . Frequency of Social Gatherings with Friends and Family: Not on file  . Attends Religious Services: Not on file  . Active Member of Clubs or Organizations: Not on file  . Attends Theatre manager Meetings: Not on file  . Marital Status: Not on file  Intimate Partner Violence:   . Fear of Current or Ex-Partner: Not on file  . Emotionally Abused: Not on file  . Physically Abused: Not on file  . Sexually Abused: Not on file   Health Maintenance  Topic Date Due  . FOOT EXAM  11/15/2016  . COLONOSCOPY  09/02/2017  . OPHTHALMOLOGY EXAM  01/11/2019  . HEMOGLOBIN A1C  10/23/2019  . TETANUS/TDAP  05/07/2024  . INFLUENZA VACCINE  Completed  . Hepatitis C Screening  Completed  . PNA vac Low Risk Adult  Completed    The following portions of the patient's history were reviewed and updated as appropriate: allergies, current medications, past family history, past medical history, past social history, past surgical history and problem list.  Review of Systems Pertinent items noted in HPI and remainder of comprehensive ROS otherwise negative.   Objective:    BP 122/76 (BP Location: Left Arm, Patient Position: Sitting, Cuff Size: Large)   Pulse 64   Temp (!) 97.4 F (36.3 C) (Temporal)   Wt 222 lb (100.7 kg)   SpO2 98%   BMI 31.85 kg/m  General appearance: alert, cooperative and no distress Head: Normocephalic, without obvious abnormality, atraumatic Eyes: conjunctivae/corneas clear. PERRL, EOM's intact. Fundi benign.  Ears: normal TM's and external ear canals both ears Nose: Nares normal. Septum midline. Mucosa normal. No drainage or sinus tenderness. Throat: lips, mucosa, and tongue normal; teeth and gums normal Neck: no adenopathy, no carotid bruit, no JVD, supple, symmetrical, trachea midline and thyroid not enlarged, symmetric, no tenderness/mass/nodules Back: no tenderness to percussion or palpation, symmetric, no curvature. ROM normal. No CVA tenderness. Lungs: clear to auscultation bilaterally Heart: regular rate and rhythm, S1, S2 normal, no murmur, click, rub or gallop Abdomen: soft, non-tender; bowel sounds normal; no masses,  no organomegaly Extremities:  extremities normal, atraumatic, no cyanosis or edema Pulses: 2+ and symmetric Skin: Skin color, texture, turgor normal. No rashes or lesions Lymph nodes: Cervical, supraclavicular, and axillary nodes normal. Neurologic: Alert and oriented X 3, normal strength and tone. Normal symmetric reflexes. Normal coordination and gait    Assessment:    Healthy male exam with urinary frequency.     Plan:     Anticipatory guidance given including wearing seatbelts, smoke detectors in the home, increasing physical activity, increasing p.o. intake of water and vegetables. -will obtain labs -given handouts -next CPE in 1 yr See After Visit Summary for Counseling Recommendations    Essential hypertension  -controlled -continue lifestyle modifications -continue losartan 50 mg daily - Plan: Basic Metabolic Panel  Type 2 diabetes mellitus with hyperglycemia, without long-term current use of insulin (HCC) -continue Jardiance 25 mg before breakfast, glipizide 10 mg before dinner, metformin 2000 mg with dinner, and Ozempic 0.5 mg weekly. -Continue follow-up with endocrinology, Dr. Cruzita Lederer  - Plan: Hemoglobin A1c  Acute bilateral low back pain without sciatica -Likely 2/2 muscle strain -Discussed supportive care - Plan: CBC with Differential/Platelet  Urinary frequency  -Possible causes include worsening diabetic control, BPH, UTI, prostatitis -We will obtain labs including PSA. -Consider trial of Flomax.  If needed we will place referral to urology. -Pt advised to decrease intake of tea and fluids close to bed. - Plan: PSA, POC Urinalysis Dipstick  Mixed hyperlipidemia  -Discussed lifestyle modifications - Plan: Lipid Panel  F/u prn  Grier Mitts, MD  This note is not being shared with the patient for the following reason: To prevent harm (release of this note would result in harm to the life or physical safety of the patient or another).

## 2019-07-30 ENCOUNTER — Other Ambulatory Visit: Payer: Self-pay | Admitting: Internal Medicine

## 2019-08-08 ENCOUNTER — Other Ambulatory Visit: Payer: Self-pay | Admitting: Internal Medicine

## 2019-08-08 DIAGNOSIS — J9610 Chronic respiratory failure, unspecified whether with hypoxia or hypercapnia: Secondary | ICD-10-CM | POA: Diagnosis not present

## 2019-08-14 ENCOUNTER — Other Ambulatory Visit: Payer: Self-pay | Admitting: Internal Medicine

## 2019-08-20 ENCOUNTER — Ambulatory Visit: Payer: Medicare Other | Attending: Internal Medicine

## 2019-08-20 DIAGNOSIS — Z23 Encounter for immunization: Secondary | ICD-10-CM

## 2019-08-20 NOTE — Progress Notes (Signed)
   Covid-19 Vaccination Clinic  Name:  Robert Cordova    MRN: [id number] DOB: [birth year]  08/20/2019  [name] was observed post Covid-19 immunization for 15 minutes without incidence. He was provided with Vaccine Information Sheet and instruction to access the V-Safe system.   [name] was instructed to call 911 with any severe reactions post vaccine: [name] Difficulty breathing  . Swelling of your face and throat  . A fast heartbeat  . A bad rash all over your body  . Dizziness and weakness    Immunizations Administered    Name Date Dose VIS Date Route   Pfizer COVID-19 Vaccine 08/20/2019  9:46 AM 0.3 mL 07/18/2019 Intramuscular   Manufacturer: [place]   Lot: [id number]   [place]: [id number]

## 2019-08-28 ENCOUNTER — Ambulatory Visit: Payer: Medicare HMO | Admitting: Internal Medicine

## 2019-08-28 ENCOUNTER — Other Ambulatory Visit: Payer: Self-pay

## 2019-08-28 ENCOUNTER — Encounter: Payer: Self-pay | Admitting: Internal Medicine

## 2019-08-28 VITALS — BP 158/80 | HR 65 | Ht 70.0 in | Wt 220.0 lb

## 2019-08-28 DIAGNOSIS — E782 Mixed hyperlipidemia: Secondary | ICD-10-CM | POA: Diagnosis not present

## 2019-08-28 DIAGNOSIS — E1110 Type 2 diabetes mellitus with ketoacidosis without coma: Secondary | ICD-10-CM

## 2019-08-28 DIAGNOSIS — E042 Nontoxic multinodular goiter: Secondary | ICD-10-CM

## 2019-08-28 MED ORDER — OZEMPIC (1 MG/DOSE) 2 MG/1.5ML ~~LOC~~ SOPN: 1.0000 mg | PEN_INJECTOR | SUBCUTANEOUS | 3 refills | Status: DC

## 2019-08-28 NOTE — Progress Notes (Signed)
Patient ID: Robert Cordova, male   DOB: 18-Aug-1950, 69 y.o.   MRN: [id number]   This visit occurred during the SARS-CoV-2 public health emergency.  Safety protocols were in place, including screening questions prior to the visit, additional usage of staff PPE, and extensive cleaning of exam room while observing appropriate contact time as indicated for disinfecting solutions.   HPI: Robert Cordova is a 69 y.o.-year-old male, returning for f/u for DM2, dx in 2013, non-insulin-dependent, uncontrolled, with complications (h/o DKA). Last visit 4 months ago.  Since last visit, he has been much less active and eating more sweets >> sugars are higher.  Reviewed HbA1c levels: Lab Results  Component Value Date   HGBA1C 7.5 (H) 07/23/2019   HGBA1C 6.9 (A) 04/25/2019   HGBA1C 7.1 (A) 09/27/2018  08/22/2016: 7.7%  He is on: - Metformin 1000 mg 2x a day, with meals >> 2000 mg with dinner - Glipizide 10 mg after dinner >> 10 mg before dinner - Jardiance 25 mg before b'fast - Ozempic 0.5 mg weekly-added 09/2018 Of note, He was off and on insulin in the past. He came off when he lost weight after his lab band surgery in 2014 (45 pounds)  He is checking sugars 1-2 times a day: - am: 118-191, 220 >> 129, 152-212 >> 118-178, 192 >> 137-225, 245 - 2h after b'fast: 87-125, 147 >> n/c >> 181, 215 - before lunch: 108-151 >> 98-142 >> 117, 131 >> 137, 141-200 - 2h after lunch: 100, 153 >> n/c >> 118-215 >> n/c  - before dinner:131 >> n/c >> 90-124 >> n/c >> 125 >> 119 - 2h after dinner:328 (cheese cake) >> n/c >> 208 >> n/c - bedtime: 121-313 >> 148-216, 249 >> 141-210, 234 >> 155-171 - nighttime: n/c >> 212 >> n/c  Lowest sugar was 98 >> 117 >> 119; he has hypoglycemia awareness in the 70s. Highest sugar was 249 >> 234 >> 245.  Pt's meals are: - Breakfast: Protein shake - Lunch: Eggs, bacon or leftovers - Dinner: Meat + veggies + starch, pizza, [place] food - Snacks: In the evening:  cookies, etc.  -No CKD, last BUN/creatinine:  Lab Results  Component Value Date   BUN 14 07/23/2019   BUN 21 05/07/2018   CREATININE 0.65 07/23/2019   CREATININE 0.77 05/07/2018  On losartan 25 mg twice a day. -+ HL; last set of lipids: Lab Results  Component Value Date   CHOL 118 07/23/2019   HDL 36.90 (L) 07/23/2019   LDLCALC 48 07/23/2019   LDLDIRECT 72.0 05/07/2018   TRIG 163.0 (H) 07/23/2019   CHOLHDL 3 07/23/2019  On Lipitor, co-Q10, resolved - last eye exam was on 01/2018: No DR, + small cataract.[facility]. -No numbness and tingling in his feet.  He has a history of thyroid nodules-status post 2 benign FNAs in 2014.  Latest TSH was normal: Lab Results  Component Value Date   TSH 0.74 05/07/2018   Review latest thyroid ultrasound report: Thyroid U/S (10/20/2016): Stable 2.8 cm solid nodule in the inferior right lobe.  This was previously sampled with negative biopsy.  The 1.8 cm nodule previously sampled in the right isthmus was no longer identified by ultrasound.  He also has sleep-related hypoxia, HTN.  He has Medicare with 2 supplemental insurances.  ROS: Constitutional: no weight gain/no weight loss, no fatigue, no subjective hyperthermia, no subjective hypothermia Eyes: no blurry vision, no xerophthalmia ENT: no sore throat, no nodules palpated in neck, no dysphagia, no odynophagia, no hoarseness  Cardiovascular: no CP/no SOB/no palpitations/no leg swelling Respiratory: no cough/no SOB/no wheezing Gastrointestinal: no N/no V/no D/no C/no acid reflux Musculoskeletal: no muscle aches/no joint aches Skin: no rashes, no hair loss Neurological: + tremors (chronis, essential)/+ numbness/no tingling/no dizziness  I reviewed pt's medications, allergies, PMH, social hx, family hx, and changes were documented in the history of present illness. Otherwise, unchanged from my initial visit note.  Past Medical History:  Diagnosis Date  . ABNORMAL GLUCOSE NEC  03/18/2007  . Arthritis    fingers  . BLEPHARITIS, LEFT 10/29/2009  . DIABETES-TYPE 2 04/18/2010  . Dry throat   . Environmental allergies   . GERD (gastroesophageal reflux disease)   . H/O hiatal hernia   . HYPERLIPIDEMIA 10/22/2007  . HYPERTENSION 03/18/2007  . Multiple thyroid nodules    "biopsy done, everything was fine" being monitored  . NEVUS, MELANOCYTIC, FACE 10/07/2009   Past Surgical History:  Procedure Laterality Date  . BIOPSY THYROID  ~08/2013  . BREATH TEK H PYLORI N/A 09/05/2013   Procedure: BREATH TEK H PYLORI;  Surgeon: [name], MD;  Location: [facility];  Service: General;  Laterality: N/A;  . COLONOSCOPY  2009, 2013   Dr. Sharlett Iles; multiple polyps  . HIATAL HERNIA REPAIR  11/25/2013   Procedure: LAPAROSCOPIC REPAIR OF HIATAL HERNIA;  Surgeon: [name], MD;  Location: [facility];  Service: General;;  . LAPAROSCOPIC GASTRIC BANDING N/A 11/25/2013   Procedure: LAPAROSCOPIC GASTRIC BANDING;  Surgeon: [name], MD;  Location: [facility];  Service: General;  Laterality: N/A;  . NO PAST SURGERIES     Social History   Social History  . Marital status: Married    Spouse name: N/A  . Number of children: 2   Occupational History  . 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 Smoking status: Never Smoker  . Smokeless tobacco: Never Used  . Alcohol use No  . Drug use: No   Current Outpatient Medications on File Prior to Visit  Medication Sig Dispense Refill  . aspirin 81 MG tablet Take 1 tablet (81 mg total) by mouth daily. 30 tablet 5  . atorvastatin (LIPITOR) 20 MG tablet Take 1 tablet (20 mg total) by mouth daily. 90 tablet 4  . Blood Glucose Monitoring Suppl (TRUE METRIX AIR GLUCOSE METER) w/Device KIT 1 Device by Other route as directed. 1 kit 0  . Cholecalciferol (VITAMIN D PO) Take 50 mcg by mouth daily.    . Coenzyme Q10 (CO Q-10) 200 MG CAPS Take by mouth daily.    . fluticasone (FLONASE) 50 MCG/ACT nasal spray  Place 2 sprays into both nostrils daily. 48 g 11  . glipiZIDE (GLUCOTROL) 5 MG tablet TAKE 1 TABLET TWICE DAILY BEFORE MEALS 180 tablet 3  . glucose blood (ONETOUCH VERIO) test strip Use as instructed x 1 a day 100 each 12  . JARDIANCE 25 MG TABS tablet TAKE 1 TABLET EVERY DAY 90 tablet 1  . losartan (COZAAR) 50 MG tablet Take 1 tablet (50 mg total) by mouth daily. 90 tablet 4  . Magnesium 250 MG TABS Take by mouth daily.     . metFORMIN (GLUCOPHAGE) 500 MG tablet TAKE 2 TABLETS TWICE DAILY WITH MEALS 360 tablet 3  . minoxidil (ROGAINE) 2 % external solution Apply 1 application topically daily. 60 mL 10  . OXYGEN-HELIUM IN Inhale into the lungs.    [name] OZEMPIC, 0.25 OR 0.5 MG/DOSE, 2 MG/1.5ML SOPN INJECT 0.5 MG INTO THE SKIN  ONCE A WEEK. 3 pen 3  . TRUEPLUS LANCETS 33G MISC Use as instructed two times daily 100 each 12   No current facility-administered medications on file prior to visit.   Allergies  Allergen Reactions  . Ace Inhibitors     REACTION: Coughing   Family History  Problem Relation Age of Onset  . Hyperlipidemia Mother   . Hypertension Mother   . Hyperlipidemia Father   . Hypertension Father   . Heart attack Father   . Hyperlipidemia Brother   . Diabetes Maternal Grandmother   . Diabetes Maternal Grandfather   . Colon cancer Neg Hx   . Stomach cancer Neg Hx   . Esophageal cancer Neg Hx   . Rectal cancer Neg Hx     PE: BP (!) 158/80   Pulse 65   Ht '5\' 10"'  (1.778 m)   Wt 220 lb (99.8 kg)   SpO2 98%   BMI 31.57 kg/m  Wt Readings from Last 3 Encounters:  08/28/19 220 lb (99.8 kg)  07/23/19 222 lb (100.7 kg)  04/25/19 221 lb (100.2 kg)   Constitutional: overweight, in NAD Eyes: PERRLA, EOMI, no exophthalmos ENT: moist mucous membranes, no thyromegaly, no cervical lymphadenopathy Cardiovascular: RRR, No MRG Respiratory: CTA B Gastrointestinal: abdomen soft, NT, ND, BS+ Musculoskeletal: no deformities, strength intact in all 4 Skin: moist, warm, no  rashes Neurological: + tremor with outstretched hands, DTR normal in all 4  ASSESSMENT: 1. DM2, non-insulin-dependent, uncontrolled, without long term complications, but with hyperglycemia - h/o DKA  2.  Multinodular goiter  3. HL  PLAN:  1. Patient with longstanding, uncontrolled, type 2 diabetes, on oral antidiabetic regimen with Metformin, sulfonylurea, and SGLT 2 inhibitor, and also on weekly GLP-1 receptor agonist.at last visit, he had slightly higher blood sugars after dinner and in the morning,  but the rest of the sugars at goal.  Upon questioning, he was taking glipizide after dinner and I advised him to move this before dinner.  I advised him to contact me if the sugars do not improve after this change, but he did not do so. -We reviewed together his latest HbA1c from last month and this was higher, 7.5% - He is tolerating Ozempic and Jardiance well. -At this visit, sugars are higher today and some of these are very high: 245!  We discussed that usually having such high blood sugars triggers the need for insulin, but I am hoping that with changing his diet and becoming more active the sugars would improve.  I also advised him to increase the Ozempic to 1 mg weekly.  I advised him to get in touch with me in approximately 1 month if the sugars do not improve - I suggested to:  Patient Instructions  Please continue:  - Metformin 2000 mg with dinner - Glipizide 10 mg before dinner - Jardiance 25 mg before b'fast  Please increase: - Ozempic to 1 mg weekly  Please return in 4 months with your sugar log  - advised to check sugars at different times of the day - 2x a day, rotating check times - advised for yearly eye exams >> he is UTD - return to clinic in 4 months  2. Thyroid nodules and goiter -He has a history of 2 thyroid nodules that have been evaluated in the past by FNA in 2014, with benign results -Most recent ultrasound of the thyroid from 2018 shows stable  nodule -Latest TSH was normal -No neck compression symptoms -No intervention needed for  now  3. HL -Reviewed latest lipid panel from 07/2019: LDL at goal, triglycerides slightly high, HDL low: Lab Results  Component Value Date   CHOL 118 07/23/2019   HDL 36.90 (L) 07/23/2019   LDLCALC 48 07/23/2019   LDLDIRECT 72.0 05/07/2018   TRIG 163.0 (H) 07/23/2019   CHOLHDL 3 07/23/2019  -Continues Lipitor, CoQ10, Krill oil, without side effects  Philemon Kingdom, MD PhD [facility] Endocrin[facility]

## 2019-08-28 NOTE — Patient Instructions (Signed)
  Please continue:             - Metformin 2000 mg with dinner - Glipizide 10 mg before dinner - Jardiance 25 mg before b'fast  Please increase: - Ozempic to 1 mg weekly  Please return in 4 months with your sugar log

## 2019-09-08 DIAGNOSIS — J9610 Chronic respiratory failure, unspecified whether with hypoxia or hypercapnia: Secondary | ICD-10-CM | POA: Diagnosis not present

## 2019-09-09 ENCOUNTER — Ambulatory Visit: Payer: Medicare HMO | Attending: Internal Medicine

## 2019-09-09 DIAGNOSIS — Z23 Encounter for immunization: Secondary | ICD-10-CM | POA: Insufficient documentation

## 2019-09-09 NOTE — Progress Notes (Signed)
   Covid-19 Vaccination Clinic  Name:  Nim Gelwicks    MRN: [id number] DOB: 1951/01/12  09/09/2019  Mr. Gavrilov was observed post Covid-19 immunization for 15 minutes without incidence. He was provided with Vaccine Information Sheet and instruction to access the V-Safe system.   Mr. Smaw was instructed to call 911 with any severe reactions post vaccine: [name] Difficulty breathing  . Swelling of your face and throat  . A fast heartbeat  . A bad rash all over your body  . Dizziness and weakness    Immunizations Administered    Name Date Dose VIS Date Route   Pfizer COVID-19 Vaccine 09/09/2019  9:21 AM 0.3 mL 07/18/2019 Intramuscular   Manufacturer: [place]   Lot: [id number]   [place]: [id number]

## 2019-10-06 DIAGNOSIS — J9610 Chronic respiratory failure, unspecified whether with hypoxia or hypercapnia: Secondary | ICD-10-CM | POA: Diagnosis not present

## 2019-10-09 DIAGNOSIS — H5210 Myopia, unspecified eye: Secondary | ICD-10-CM | POA: Diagnosis not present

## 2019-10-09 LAB — HM DIABETES EYE EXAM

## 2019-10-21 DIAGNOSIS — H2511 Age-related nuclear cataract, right eye: Secondary | ICD-10-CM | POA: Diagnosis not present

## 2019-10-21 DIAGNOSIS — H25043 Posterior subcapsular polar age-related cataract, bilateral: Secondary | ICD-10-CM | POA: Diagnosis not present

## 2019-10-21 DIAGNOSIS — H25013 Cortical age-related cataract, bilateral: Secondary | ICD-10-CM | POA: Diagnosis not present

## 2019-10-21 DIAGNOSIS — H2513 Age-related nuclear cataract, bilateral: Secondary | ICD-10-CM | POA: Diagnosis not present

## 2019-10-21 DIAGNOSIS — H18413 Arcus senilis, bilateral: Secondary | ICD-10-CM | POA: Diagnosis not present

## 2019-11-06 DIAGNOSIS — J9610 Chronic respiratory failure, unspecified whether with hypoxia or hypercapnia: Secondary | ICD-10-CM | POA: Diagnosis not present

## 2019-11-21 ENCOUNTER — Other Ambulatory Visit: Payer: Self-pay

## 2019-11-21 MED ORDER — TRUE METRIX BLOOD GLUCOSE TEST VI STRP: ORAL_STRIP | 12 refills | Status: DC

## 2019-11-24 DIAGNOSIS — H2512 Age-related nuclear cataract, left eye: Secondary | ICD-10-CM | POA: Diagnosis not present

## 2019-11-25 DIAGNOSIS — H2511 Age-related nuclear cataract, right eye: Secondary | ICD-10-CM | POA: Diagnosis not present

## 2019-11-25 DIAGNOSIS — H2512 Age-related nuclear cataract, left eye: Secondary | ICD-10-CM | POA: Diagnosis not present

## 2019-12-06 DIAGNOSIS — J9610 Chronic respiratory failure, unspecified whether with hypoxia or hypercapnia: Secondary | ICD-10-CM | POA: Diagnosis not present

## 2019-12-08 DIAGNOSIS — H52201 Unspecified astigmatism, right eye: Secondary | ICD-10-CM | POA: Diagnosis not present

## 2019-12-08 DIAGNOSIS — H2511 Age-related nuclear cataract, right eye: Secondary | ICD-10-CM | POA: Diagnosis not present

## 2019-12-26 ENCOUNTER — Other Ambulatory Visit: Payer: Self-pay | Admitting: Internal Medicine

## 2020-01-01 ENCOUNTER — Other Ambulatory Visit: Payer: Self-pay

## 2020-01-06 ENCOUNTER — Encounter: Payer: Self-pay | Admitting: Internal Medicine

## 2020-01-06 ENCOUNTER — Other Ambulatory Visit: Payer: Self-pay

## 2020-01-06 ENCOUNTER — Ambulatory Visit (INDEPENDENT_AMBULATORY_CARE_PROVIDER_SITE_OTHER): Payer: Medicare HMO | Admitting: Internal Medicine

## 2020-01-06 VITALS — BP 138/80 | HR 62 | Ht 70.0 in | Wt 216.0 lb

## 2020-01-06 DIAGNOSIS — E1165 Type 2 diabetes mellitus with hyperglycemia: Secondary | ICD-10-CM | POA: Diagnosis not present

## 2020-01-06 DIAGNOSIS — R200 Anesthesia of skin: Secondary | ICD-10-CM

## 2020-01-06 DIAGNOSIS — J9610 Chronic respiratory failure, unspecified whether with hypoxia or hypercapnia: Secondary | ICD-10-CM | POA: Diagnosis not present

## 2020-01-06 DIAGNOSIS — E782 Mixed hyperlipidemia: Secondary | ICD-10-CM | POA: Diagnosis not present

## 2020-01-06 DIAGNOSIS — E042 Nontoxic multinodular goiter: Secondary | ICD-10-CM

## 2020-01-06 DIAGNOSIS — Z0100 Encounter for examination of eyes and vision without abnormal findings: Secondary | ICD-10-CM | POA: Diagnosis not present

## 2020-01-06 LAB — POCT GLYCOSYLATED HEMOGLOBIN (HGB A1C): Hemoglobin A1C: 7.2 % — AB (ref 4.0–5.6)

## 2020-01-06 LAB — TSH: TSH: 0.46 u[IU]/mL (ref 0.35–4.50)

## 2020-01-06 LAB — VITAMIN B12: Vitamin B-12: >1526 pg/mL — ABNORMAL HIGH (ref 211–911)

## 2020-01-06 NOTE — Progress Notes (Addendum)
Patient ID: Robert Cordova, male   DOB: 03-Mar-1951, 69 y.o.   MRN: [id number]   This visit occurred during the SARS-CoV-2 public health emergency.  Safety protocols were in place, including screening questions prior to the visit, additional usage of staff PPE, and extensive cleaning of exam room while observing appropriate contact time as indicated for disinfecting solutions.   HPI: [name] is a 69 y.o.-year-old male, returning for f/u for DM2, dx in 2013, non-insulin-dependent, uncontrolled, with complications (h/o DKA). Last visit 4 months ago.  Reviewed HbA1c levels: Lab Results  Component Value Date   HGBA1C 7.5 (H) 07/23/2019   HGBA1C 6.9 (A) 04/25/2019   HGBA1C 7.1 (A) 09/27/2018  08/22/2016: 7.7%  He is on: - Metformin 1000 mg 2x a day, with meals >> 2000 mg with dinner - Glipizide 10 mg after dinner >> 10 mg before dinner - Jardiance 25 mg before b'fast - Ozempic 0.5 mg weekly-added 09/2018 >> 1 mg weekly - increased 09/2019 Of note, He was off and on insulin in the past. He came off when he lost weight after his lab band surgery in 2014 (45 pounds)  He is checking sugars 1-2 times a day - am: 118-178, 192 >> 137-225, 245 >> 114, 115, 136-204, 221 (ave 150-160) - 2h after b'fast: 87-125, 147 >> n/c >> 181, 215 >> n/c - before lunch: 98-142 >> 117, 131 >> 137, 141-200 >> 99-122 - 2h after lunch: 100, 153 >> n/c >> 118-215 >> n/c >> 121 - before dinner:131 >> n/c >> 90-124 >> n/c >> 125 >> 119 >> 108-134 - 2h after dinner:328 (cheese cake) >> n/c >> 208 >> n/c - bedtime:  148-216, 249 >> 141-210, 234 >> 155-171 >> 144-188 - nighttime: n/c >> 212 >> n/c  Lowest sugar was 98 >> 117 >> 119 >> 114; he has hypoglycemia awareness in the 70s. Highest sugar was 249 >> 234 >> 245 >> 221.  Pt's meals are: - Breakfast: Protein shake - Lunch: Eggs, bacon or leftovers - Dinner: Meat + veggies + starch, pizza, [place] food - Snacks: In the evening: cookies, etc.  -No  CKD, last BUN/creatinine:  Lab Results  Component Value Date   BUN 14 07/23/2019   BUN 21 05/07/2018   CREATININE 0.65 07/23/2019   CREATININE 0.77 05/07/2018  On losartan 25 mg twice a day. -+ HL; last set of lipids: Lab Results  Component Value Date   CHOL 118 07/23/2019   HDL 36.90 (L) 07/23/2019   LDLCALC 48 07/23/2019   LDLDIRECT 72.0 05/07/2018   TRIG 163.0 (H) 07/23/2019   CHOLHDL 3 07/23/2019  On Lipitor, co-Q10, krill oil. - last eye exam was on 10/09/2019 (Dr. Nicki Reaper) and 10/21/2019 (Dr. Katherene Ponto): No DR, + incipient cataract. [facility]. -+ numbness in 2 of his toes - 4 and 5 of L foot  He has a history of thyroid nodules-status post 2 benign FNAs in 2014.  Pt denies: - feeling nodules in neck - hoarseness - dysphagia - choking - SOB with lying down  Latest TSH was normal: Lab Results  Component Value Date   TSH 0.74 05/07/2018   Latest thyroid ultrasound report: Thyroid U/S (10/20/2016): Stable 2.8 cm solid nodule in the inferior right lobe.  This was previously sampled with negative biopsy.  The 1.8 cm nodule previously sampled in the right isthmus was no longer identified by ultrasound.  He also has sleep-related hypoxia, HTN.  He has Medicare with 2 supplemental insurances.  ROS: Constitutional: no  weight gain/+ weight loss, no fatigue, no subjective hyperthermia, no subjective hypothermia Eyes: no blurry vision, no xerophthalmia ENT: no sore throat, + see HPI Cardiovascular: no CP/no SOB/no palpitations/no leg swelling Respiratory: no cough/no SOB/no wheezing Gastrointestinal: no N/no V/no D/no C/no acid reflux Musculoskeletal: no muscle aches/no joint aches Skin: no rashes, no hair loss Neurological: + tremors (essential)/+ numbness/no tingling/no dizziness  I reviewed pt's medications, allergies, PMH, social hx, family hx, and changes were documented in the history of present illness. Otherwise, unchanged from my initial visit note.  Past  Medical History:  Diagnosis Date  . ABNORMAL GLUCOSE NEC 03/18/2007  . Arthritis    fingers  . BLEPHARITIS, LEFT 10/29/2009  . DIABETES-TYPE 2 04/18/2010  . Dry throat   . Environmental allergies   . GERD (gastroesophageal reflux disease)   . H/O hiatal hernia   . HYPERLIPIDEMIA 10/22/2007  . HYPERTENSION 03/18/2007  . Multiple thyroid nodules    "biopsy done, everything was fine" being monitored  . NEVUS, MELANOCYTIC, FACE 10/07/2009   Past Surgical History:  Procedure Laterality Date  . BIOPSY THYROID  ~08/2013  . BREATH TEK H PYLORI N/A 09/05/2013   Procedure: BREATH TEK H PYLORI;  Surgeon: [name], MD;  Location: [facility];  Service: General;  Laterality: N/A;  . COLONOSCOPY  2009, 2013   Dr. Sharlett Iles; multiple polyps  . HIATAL HERNIA REPAIR  11/25/2013   Procedure: LAPAROSCOPIC REPAIR OF HIATAL HERNIA;  Surgeon: [name], MD;  Location: [facility];  Service: General;;  . LAPAROSCOPIC GASTRIC BANDING N/A 11/25/2013   Procedure: LAPAROSCOPIC GASTRIC BANDING;  Surgeon: [name], MD;  Location: [facility];  Service: General;  Laterality: N/A;  . NO PAST SURGERIES     Social History   Social History  . Marital status: Married    Spouse name: N/A  . Number of children: 2   Occupational History  . 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 Smoking status: Never Smoker  . Smokeless tobacco: Never Used  . Alcohol use No  . Drug use: No   Current Outpatient Medications on File Prior to Visit  Medication Sig Dispense Refill  . aspirin 81 MG tablet Take 1 tablet (81 mg total) by mouth daily. 30 tablet 5  . atorvastatin (LIPITOR) 20 MG tablet Take 1 tablet (20 mg total) by mouth daily. 90 tablet 4  . Blood Glucose Monitoring Suppl (TRUE METRIX AIR GLUCOSE METER) w/Device KIT 1 Device by Other route as directed. 1 kit 0  . Cholecalciferol (VITAMIN D PO) Take 50 mcg by mouth daily.    . Coenzyme Q10 (CO Q-10) 200 MG CAPS Take by mouth  daily.    . fluticasone (FLONASE) 50 MCG/ACT nasal spray Place 2 sprays into both nostrils daily. 48 g 11  . glipiZIDE (GLUCOTROL) 5 MG tablet TAKE 1 TABLET TWICE DAILY BEFORE MEALS 180 tablet 3  . glucose blood (TRUE METRIX BLOOD GLUCOSE TEST) test strip Use to check blood sugar once a day. 100 each 12  . JARDIANCE 25 MG TABS tablet TAKE 1 TABLET EVERY DAY 90 tablet 1  . losartan (COZAAR) 50 MG tablet Take 1 tablet (50 mg total) by mouth daily. 90 tablet 4  . Magnesium 250 MG TABS Take by mouth daily.     . metFORMIN (GLUCOPHAGE) 500 MG tablet TAKE 2 TABLETS TWICE DAILY WITH MEALS 360 tablet 3  . minoxidil (ROGAINE) 2 % external solution Apply 1 application topically daily. [place]  mL 10  . OXYGEN-HELIUM IN Inhale into the lungs.    . Semaglutide, 1 MG/DOSE, (OZEMPIC, 1 MG/DOSE,) 2 MG/1.5ML SOPN Inject 1 mg into the skin once a week. 6 pen 3  . TRUEPLUS LANCETS 33G MISC Use as instructed two times daily 100 each 12   No current facility-administered medications on file prior to visit.   Allergies  Allergen Reactions  . Ace Inhibitors     REACTION: Coughing   Family History  Problem Relation Age of Onset  . Hyperlipidemia Mother   . Hypertension Mother   . Hyperlipidemia Father   . Hypertension Father   . Heart attack Father   . Hyperlipidemia Brother   . Diabetes Maternal Grandmother   . Diabetes Maternal Grandfather   . Colon cancer Neg Hx   . Stomach cancer Neg Hx   . Esophageal cancer Neg Hx   . Rectal cancer Neg Hx     PE: BP 138/80   Pulse 62   Ht '5\' 10"'  (1.778 m)   Wt 216 lb (98 kg)   SpO2 97%   BMI 30.99 kg/m  Wt Readings from Last 3 Encounters:  01/06/20 216 lb (98 kg)  08/28/19 220 lb (99.8 kg)  07/23/19 222 lb (100.7 kg)   Constitutional: overweight, in NAD Eyes: PERRLA, EOMI, no exophthalmos ENT: moist mucous membranes, no thyromegaly but ~ 2.5 cm thyroid nodule palpable in R inferior thyroid, no cervical lymphadenopathy Cardiovascular: RRR, No  MRG Respiratory: CTA B Gastrointestinal: abdomen soft, NT, ND, BS+ Musculoskeletal: no deformities, strength intact in all 4 Skin: moist, warm, no rashes Neurological: no tremor with outstretched hands, DTR normal in all 4  ASSESSMENT: 1. DM2, non-insulin-dependent, uncontrolled, without long term complications, but with hyperglycemia - h/o DKA  2.  Multinodular goiter  3. HL  4.  Numbness in toes  PLAN:  1. Patient with longstanding, uncontrolled, type 2 diabetes, on oral antidiabetic regimen with Metformin, sulfonylurea, SGLT 2 inhibitor and also weekly GLP-1 receptor agonist, increased at last visit.  At that time, HbA1c was higher, at 7.5%.  His sugars were higher and some of these above 200s (highest 245).  We discussed about the importance of changing his diet and I advised him to get in touch with me approximately 1 month after last visit if the sugars did not improve. -At this visit, sugars are at goal later in the day but they are still above target in the morning.  He is having snacks at night and we discussed about stopping these.  Another option would be to start basal insulin.  He prefers to try to stop the snacks for now and continue to stay active.  I advised him to also check some more sugars before and after dinner. - I suggested to:  Patient Instructions  Please continue:  - Metformin 2000 mg with dinner - Glipizide 10 mg before dinner - Jardiance 25 mg before b'fast - Ozempic 1 mg weekly  Try to check more sugars at night.  Please return in 3-4 months with your sugar log  - we checked his HbA1c: 7.2% (slightly lower) - advised to check sugars at different times of the day - 1-2x a day, rotating check times - advised for yearly eye exams >> he is UTD - return to clinic in 4 months  2. Thyroid nodules and goiter -He has a history of 2 thyroid nodules that have been evaluated in the past by FNA in 2014, with benign results -Reviewed latest ultrasound results  from 2018: Stable nodules -Latest TSH was reviewed and this was normal; would repeat a level today -No neck compression symptoms -We will check another thyroid ultrasound now  3. HL -Reviewed latest lipid panel from 07/2019: LDL at goal, HDL low, triglycerides slightly high: Lab Results  Component Value Date   CHOL 118 07/23/2019   HDL 36.90 (L) 07/23/2019   LDLCALC 48 07/23/2019   LDLDIRECT 72.0 05/07/2018   TRIG 163.0 (H) 07/23/2019   CHOLHDL 3 07/23/2019  -Continues Lipitor, co-Q10, krill oil, without side effects  4.  Numbness in toes -No tingling. -We will check a B12 level -He is taking a B12 supplement -Explained that Metformin can decrease B12 absorption.  Component     Latest Ref Rng & Units 01/06/2020  Hemoglobin A1C     4.0 - 5.6 % 7.2 (A)  TSH     0.35 - 4.50 uIU/mL 0.46  Vitamin B12     211 - 911 pg/mL >1526 (H)   Normal TSH and high B12 -I will advise him to decrease the dose of B12 supplement.  Thyroid U/S (01/15/2020): COMPARISON:  10/19/2016  FINDINGS: Parenchymal Echotexture: Mildly heterogenous Isthmus: 0.9 cm Right lobe: 6.1 x 2.6 x 1.9 cm. Left lobe: 5.4 x 2.3 x 1.3 cm _________________________________________________________  Nodule # 2: Prior biopsy: Yes Location: Right; Inferior Maximum size: 3.7 cm; Other 2 dimensions: 3.1 x 2.8 cm, previously, 2.8 x 2.6 x 2.1 cm Composition: solid/almost completely solid (2) Echogenicity: isoechoic (1) Significant change in size (>/= 20% in two dimensions and minimal increase of 2 mm): Yes  **Given size (>/= 2.5 cm) and appearance, fine needle aspiration of this mildly suspicious nodule should be considered based on TI-RADS criteria. _______________________________________________________  There is a stable small hypoechoic nodule measuring approximately 7 mm in the right mid thyroid gland.  IMPRESSION: Interval increase in size of the dominant right-sided thyroid nodule, currently measuring 3.7 cm  (previously measuring 2.8 cm). While this thyroid nodule was previously biopsied, consider repeat ultrasound or fine-needle aspiration given the nodule's significant interval growth.  The above is in keeping with the ACR TI-RADS recommendations - J Am Coll Radiol 2017;14:587-595.  Electronically Signed   By: Constance Holster M.D.   On: 01/15/2020 00:31  The right inferior thyroid nodule increased in size since last ultrasound from 2018.  We will suggest a new biopsy.  Thyroid FNA (01/20/2020): FINAL MICROSCOPIC DIAGNOSIS:  - Consistent with benign follicular nodule (Bethesda category II)   SPECIMEN ADEQUACY:  Satisfactory for evaluation   Philemon Kingdom, MD PhD [facility]

## 2020-01-06 NOTE — Patient Instructions (Signed)
Please continue:  - Metformin 2000 mg with dinner - Glipizide 10 mg before dinner - Jardiance 25 mg before b'fast - Ozempic 1 mg weekly  Try to check more sugars at night.  Please return in 3-4 months with your sugar log

## 2020-01-09 ENCOUNTER — Encounter: Payer: Self-pay | Admitting: Internal Medicine

## 2020-01-14 ENCOUNTER — Ambulatory Visit
Admission: RE | Admit: 2020-01-14 | Discharge: 2020-01-14 | Disposition: A | Discharge status: Home / Self Care | Payer: Medicare HMO | Source: Ambulatory Visit | Attending: Internal Medicine | Admitting: Internal Medicine

## 2020-01-14 ENCOUNTER — Other Ambulatory Visit: Payer: Medicare HMO

## 2020-01-14 DIAGNOSIS — E041 Nontoxic single thyroid nodule: Secondary | ICD-10-CM | POA: Diagnosis not present

## 2020-01-14 DIAGNOSIS — E042 Nontoxic multinodular goiter: Secondary | ICD-10-CM

## 2020-01-15 ENCOUNTER — Encounter: Payer: Self-pay | Admitting: Internal Medicine

## 2020-01-16 ENCOUNTER — Other Ambulatory Visit: Payer: Self-pay | Admitting: Internal Medicine

## 2020-01-16 DIAGNOSIS — E042 Nontoxic multinodular goiter: Secondary | ICD-10-CM

## 2020-01-20 ENCOUNTER — Ambulatory Visit
Admission: RE | Admit: 2020-01-20 | Discharge: 2020-01-20 | Disposition: A | Discharge status: Home / Self Care | Payer: Medicare HMO | Source: Ambulatory Visit | Attending: Internal Medicine | Admitting: Internal Medicine

## 2020-01-20 ENCOUNTER — Other Ambulatory Visit (HOSPITAL_COMMUNITY)
Admission: RE | Admit: 2020-01-20 | Discharge: 2020-01-20 | Disposition: A | Discharge status: Home / Self Care | Payer: Medicare HMO | Source: Ambulatory Visit | Attending: Radiology | Admitting: Radiology

## 2020-01-20 DIAGNOSIS — E042 Nontoxic multinodular goiter: Secondary | ICD-10-CM

## 2020-01-20 DIAGNOSIS — E041 Nontoxic single thyroid nodule: Secondary | ICD-10-CM | POA: Diagnosis not present

## 2020-01-21 LAB — CYTOLOGY - NON PAP

## 2020-02-05 DIAGNOSIS — J9610 Chronic respiratory failure, unspecified whether with hypoxia or hypercapnia: Secondary | ICD-10-CM | POA: Diagnosis not present

## 2020-02-26 DIAGNOSIS — D485 Neoplasm of uncertain behavior of skin: Secondary | ICD-10-CM | POA: Diagnosis not present

## 2020-02-26 DIAGNOSIS — L570 Actinic keratosis: Secondary | ICD-10-CM | POA: Diagnosis not present

## 2020-02-26 DIAGNOSIS — D229 Melanocytic nevi, unspecified: Secondary | ICD-10-CM | POA: Diagnosis not present

## 2020-02-26 DIAGNOSIS — L249 Irritant contact dermatitis, unspecified cause: Secondary | ICD-10-CM | POA: Diagnosis not present

## 2020-02-26 DIAGNOSIS — D1801 Hemangioma of skin and subcutaneous tissue: Secondary | ICD-10-CM | POA: Diagnosis not present

## 2020-02-26 DIAGNOSIS — L814 Other melanin hyperpigmentation: Secondary | ICD-10-CM | POA: Diagnosis not present

## 2020-02-26 DIAGNOSIS — L819 Disorder of pigmentation, unspecified: Secondary | ICD-10-CM | POA: Diagnosis not present

## 2020-02-26 DIAGNOSIS — B351 Tinea unguium: Secondary | ICD-10-CM | POA: Diagnosis not present

## 2020-02-26 DIAGNOSIS — L821 Other seborrheic keratosis: Secondary | ICD-10-CM | POA: Diagnosis not present

## 2020-02-27 ENCOUNTER — Other Ambulatory Visit: Payer: Self-pay

## 2020-02-27 ENCOUNTER — Ambulatory Visit (INDEPENDENT_AMBULATORY_CARE_PROVIDER_SITE_OTHER): Payer: Medicare HMO

## 2020-02-27 DIAGNOSIS — Z1211 Encounter for screening for malignant neoplasm of colon: Secondary | ICD-10-CM

## 2020-02-27 DIAGNOSIS — Z0000 Encounter for general adult medical examination without abnormal findings: Secondary | ICD-10-CM

## 2020-02-27 NOTE — Patient Instructions (Signed)
Robert Cordova , Thank you for taking time to come for your Medicare Wellness Visit. I appreciate your ongoing commitment to your health goals. Please review the following plan we discussed and let me know if I can assist you in the future.   Screening recommendations/referrals: Colonoscopy: Currently due, order placed for Gastroenterology referral Recommended yearly ophthalmology/optometry visit for glaucoma screening and checkup Recommended yearly dental visit for hygiene and checkup  Vaccinations: Influenza vaccine: Up to date, next due 03/2020 Pneumococcal vaccine: Completed series Tdap vaccine: Up to date, next due 05/07/2024 Shingles vaccine: Currently due, please check with your pharmacy regarding cost and to receive vaccine.    Advanced directives: Advance directive discussed with you today. Even though you declined this today please call our office should you change your mind and we can give you the proper paperwork for you to fill out.   Conditions/risks identified: none   Next appointment: none   Preventive Care 17 Years and Older, Male Preventive care refers to lifestyle choices and visits with your health care provider that can promote health and wellness. What does preventive care include?  A yearly physical exam. This is also called an annual well check.  Dental exams once or twice a year.  Routine eye exams. Ask your health care provider how often you should have your eyes checked.  Personal lifestyle choices, including:  Daily care of your teeth and gums.  Regular physical activity.  Eating a healthy diet.  Avoiding tobacco and drug use.  Limiting alcohol use.  Practicing safe sex.  Taking low doses of aspirin every day.  Taking vitamin and mineral supplements as recommended by your health care provider. What happens during an annual well check? The services and screenings done by your health care provider during your annual well check will depend  on your age, overall health, lifestyle risk factors, and family history of disease. Counseling  Your health care provider may ask you questions about your:  Alcohol use.  Tobacco use.  Drug use.  Emotional well-being.  Home and relationship well-being.  Sexual activity.  Eating habits.  History of falls.  Memory and ability to understand (cognition).  Work and work Statistician. Screening  You may have the following tests or measurements:  Height, weight, and BMI.  Blood pressure.  Lipid and cholesterol levels. These may be checked every 5 years, or more frequently if you are over 31 years old.  Skin check.  Lung cancer screening. You may have this screening every year starting at age 10 if you have a 30-pack-year history of smoking and currently smoke or have quit within the past 15 years.  Fecal occult blood test (FOBT) of the stool. You may have this test every year starting at age 32.  Flexible sigmoidoscopy or colonoscopy. You may have a sigmoidoscopy every 5 years or a colonoscopy every 10 years starting at age 49.  Prostate cancer screening. Recommendations will vary depending on your family history and other risks.  Hepatitis C blood test.  Hepatitis B blood test.  Sexually transmitted disease (STD) testing.  Diabetes screening. This is done by checking your blood sugar (glucose) after you have not eaten for a while (fasting). You may have this done every 1-3 years.  Abdominal aortic aneurysm (AAA) screening. You may need this if you are a current or former smoker.  Osteoporosis. You may be screened starting at age 51 if you are at high risk. Talk with your health care provider about your test results, treatment  options, and if necessary, the need for more tests. Vaccines  Your health care provider may recommend certain vaccines, such as:  Influenza vaccine. This is recommended every year.  Tetanus, diphtheria, and acellular pertussis (Tdap, Td)  vaccine. You may need a Td booster every 10 years.  Zoster vaccine. You may need this after age 29.  Pneumococcal 13-valent conjugate (PCV13) vaccine. One dose is recommended after age 84.  Pneumococcal polysaccharide (PPSV23) vaccine. One dose is recommended after age 23. Talk to your health care provider about which screenings and vaccines you need and how often you need them. This information is not intended to replace advice given to you by your health care provider. Make sure you discuss any questions you have with your health care provider. Document Released: 08/20/2015 Document Revised: 04/12/2016 Document Reviewed: 05/25/2015 Elsevier Interactive Patient Education  2017 [place] Prevention in the Home Falls can cause injuries. They can happen to people of all ages. There are many things you can do to make your home safe and to help prevent falls. What can I do on the outside of my home?  Regularly fix the edges of walkways and driveways and fix any cracks.  Remove anything that might make you trip as you walk through a door, such as a raised step or threshold.  Trim any bushes or trees on the path to your home.  Use bright outdoor lighting.  Clear any walking paths of anything that might make someone trip, such as rocks or tools.  Regularly check to see if handrails are loose or broken. Make sure that both sides of any steps have handrails.  Any raised decks and porches should have guardrails on the edges.  Have any leaves, snow, or ice cleared regularly.  Use sand or salt on walking paths during winter.  Clean up any spills in your garage right away. This includes oil or grease spills. What can I do in the bathroom?  Use night lights.  Install grab bars by the toilet and in the tub and shower. Do not use towel bars as grab bars.  Use non-skid mats or decals in the tub or shower.  If you need to sit down in the shower, use a plastic, non-slip  stool.  Keep the floor dry. Clean up any water that spills on the floor as soon as it happens.  Remove soap buildup in the tub or shower regularly.  Attach bath mats securely with double-sided non-slip rug tape.  Do not have throw rugs and other things on the floor that can make you trip. What can I do in the bedroom?  Use night lights.  Make sure that you have a light by your bed that is easy to reach.  Do not use any sheets or blankets that are too big for your bed. They should not hang down onto the floor.  Have a firm chair that has side arms. You can use this for support while you get dressed.  Do not have throw rugs and other things on the floor that can make you trip. What can I do in the kitchen?  Clean up any spills right away.  Avoid walking on wet floors.  Keep items that you use a lot in easy-to-reach places.  If you need to reach something above you, use a strong step stool that has a grab bar.  Keep electrical cords out of the way.  Do not use floor polish or wax that makes floors slippery.  If you must use wax, use non-skid floor wax.  Do not have throw rugs and other things on the floor that can make you trip. What can I do with my stairs?  Do not leave any items on the stairs.  Make sure that there are handrails on both sides of the stairs and use them. Fix handrails that are broken or loose. Make sure that handrails are as long as the stairways.  Check any carpeting to make sure that it is firmly attached to the stairs. Fix any carpet that is loose or worn.  Avoid having throw rugs at the top or bottom of the stairs. If you do have throw rugs, attach them to the floor with carpet tape.  Make sure that you have a light switch at the top of the stairs and the bottom of the stairs. If you do not have them, ask someone to add them for you. What else can I do to help prevent falls?  Wear shoes that:  Do not have high heels.  Have rubber bottoms.  Are  comfortable and fit you well.  Are closed at the toe. Do not wear sandals.  If you use a stepladder:  Make sure that it is fully opened. Do not climb a closed stepladder.  Make sure that both sides of the stepladder are locked into place.  Ask someone to hold it for you, if possible.  Clearly mark and make sure that you can see:  Any grab bars or handrails.  First and last steps.  Where the edge of each step is.  Use tools that help you move around (mobility aids) if they are needed. These include:  Canes.  Walkers.  Scooters.  Crutches.  Turn on the lights when you go into a dark area. Replace any light bulbs as soon as they burn out.  Set up your furniture so you have a clear path. Avoid moving your furniture around.  If any of your floors are uneven, fix them.  If there are any pets around you, be aware of where they are.  Review your medicines with your doctor. Some medicines can make you feel dizzy. This can increase your chance of falling. Ask your doctor what other things that you can do to help prevent falls. This information is not intended to replace advice given to you by your health care provider. Make sure you discuss any questions you have with your health care provider. Document Released: 05/20/2009 Document Revised: 12/30/2015 Document Reviewed: 08/28/2014 Elsevier Interactive Patient Education  2017 [employer].

## 2020-02-27 NOTE — Progress Notes (Signed)
Subjective:   Robert Cordova is a 69 y.o. male who presents for Medicare Annual/Subsequent preventive examination.  I connected with Robert Cordova today by telephone and verified that I am speaking with the correct person using two identifiers. Location patient: home Location provider: work Persons participating in the virtual visit: patient, provider.   I discussed the limitations, risks, security and privacy concerns of performing an evaluation and management service by telephone and the availability of in person appointments. I also discussed with the patient that there may be a patient responsible charge related to this service. The patient expressed understanding and verbally consented to this telephonic visit.    Interactive audio and video telecommunications were attempted between this provider and patient, however failed, due to patient having technical difficulties OR patient did not have access to video capability.  We continued and completed visit with audio only.      Review of Systems    N/A Cardiac Risk Factors include: advanced age (>45mn, >>52women);diabetes mellitus;dyslipidemia;hypertension;male gender     Objective:    Today's Vitals   02/27/20 0900  PainSc: 3    There is no height or weight on file to calculate BMI.  Advanced Directives 02/27/2020 04/29/2017 08/15/2016 11/25/2013 11/17/2013  Does Patient Have a Medical Advance Directive? No No No Patient does not have advance directive Patient does not have advance directive;Patient would like information  Would patient like information on creating a medical advance directive? No - Patient declined No - Patient declined - - Advance directive packet given  Pre-existing out of facility DNR order (yellow form or pink MOST form) - - - No No    Current Medications (verified) Outpatient Encounter Medications as of 02/27/2020  Medication Sig  . aspirin 81 MG tablet Take 1 tablet (81 mg total) by mouth  daily.  .Marland Kitchenatorvastatin (LIPITOR) 20 MG tablet Take 1 tablet (20 mg total) by mouth daily.  . Blood Glucose Monitoring Suppl (TRUE METRIX AIR GLUCOSE METER) w/Device KIT 1 Device by Other route as directed.  . Cholecalciferol (VITAMIN D PO) Take 50 mcg by mouth daily.  . Coenzyme Q10 (CO Q-10) 200 MG CAPS Take by mouth daily.  . fluticasone (FLONASE) 50 MCG/ACT nasal spray Place 2 sprays into both nostrils daily.  .Marland KitchenglipiZIDE (GLUCOTROL) 5 MG tablet TAKE 1 TABLET TWICE DAILY BEFORE MEALS  . glucose blood (TRUE METRIX BLOOD GLUCOSE TEST) test strip Use to check blood sugar once a day.  .Marland KitchenJARDIANCE 25 MG TABS tablet TAKE 1 TABLET EVERY DAY  . Lidocaine 1.8 % PTCH Apply topically.  .Marland Kitchenlosartan (COZAAR) 50 MG tablet Take 1 tablet (50 mg total) by mouth daily.  . Magnesium 250 MG TABS Take by mouth daily.   . metFORMIN (GLUCOPHAGE) 500 MG tablet TAKE 2 TABLETS TWICE DAILY WITH MEALS  . minoxidil (ROGAINE) 2 % external solution Apply 1 application topically daily.  . Multiple Vitamins-Minerals (MULTIVITAMIN WITH MINERALS) tablet Take 1 tablet by mouth daily.  . Semaglutide, 1 MG/DOSE, (OZEMPIC, 1 MG/DOSE,) 2 MG/1.5ML SOPN Inject 1 mg into the skin once a week.  . TRUEPLUS LANCETS 33G MISC Use as instructed two times daily  . vitamin B-12 (CYANOCOBALAMIN) 100 MCG tablet Take 100 mcg by mouth daily.  . fluticasone (CUTIVATE) 0.005 % ointment SMARTSIG:Sparingly Topical Twice Daily PRN (Patient not taking: Reported on 02/27/2020)  . OXYGEN-HELIUM IN Inhale into the lungs. (Patient not taking: Reported on 02/27/2020)  . PROLENSA 0.07 % SOLN SMARTSIG:1 Drop(s) Left Eye Every Evening (  Patient not taking: Reported on 02/27/2020)  . [DISCONTINUED] DUREZOL 0.05 % EMUL    No facility-administered encounter medications on file as of 02/27/2020.    Allergies (verified) Ace inhibitors   History: Past Medical History:  Diagnosis Date  . ABNORMAL GLUCOSE NEC 03/18/2007  . Arthritis    fingers  . BLEPHARITIS,  LEFT 10/29/2009  . DIABETES-TYPE 2 04/18/2010  . Dry throat   . Environmental allergies   . GERD (gastroesophageal reflux disease)   . H/O hiatal hernia   . HYPERLIPIDEMIA 10/22/2007  . HYPERTENSION 03/18/2007  . Multiple thyroid nodules    "biopsy done, everything was fine" being monitored  . NEVUS, MELANOCYTIC, FACE 10/07/2009   Past Surgical History:  Procedure Laterality Date  . BIOPSY THYROID  ~08/2013  . BREATH TEK H PYLORI N/A 09/05/2013   Procedure: BREATH TEK H PYLORI;  Surgeon: [name], MD;  Location: [facility];  Service: General;  Laterality: N/A;  . CATARACT EXTRACTION Bilateral   . COLONOSCOPY  2009, 2013   Dr. Sharlett Iles; multiple polyps  . HIATAL HERNIA REPAIR  11/25/2013   Procedure: LAPAROSCOPIC REPAIR OF HIATAL HERNIA;  Surgeon: [name], MD;  Location: [facility];  Service: General;;  . LAPAROSCOPIC GASTRIC BANDING N/A 11/25/2013   Procedure: LAPAROSCOPIC GASTRIC BANDING;  Surgeon: [name], MD;  Location: [facility];  Service: General;  Laterality: N/A;  . NO PAST SURGERIES     Family History  Problem Relation Age of Onset  . Hyperlipidemia Mother   . Hypertension Mother   . Arthritis Mother   . Cancer Mother   . Hyperlipidemia Father   . Hypertension Father   . Heart attack Father   . Hyperlipidemia Brother   . Diabetes Maternal Grandmother   . Diabetes Maternal Grandfather   . Colon cancer Neg Hx   . Stomach cancer Neg Hx   . Esophageal cancer Neg Hx   . Rectal cancer Neg Hx    Social History   Socioeconomic History  . Marital status: Married    Spouse name: Not on file  . Number of children: Not on file  . Years of education: Not on file  . Highest education level: Not on file  Occupational History  . Occupation: Optometrist-- self-employeed  Tobacco Use  . Smoking status: Never Smoker  . Smokeless tobacco: Never Used  Vaping Use  . Vaping Use: Never used  Substance and Sexual Activity  . Alcohol use: No  . Drug use: No  .  Sexual activity: Not on file  Other Topics Concern  . Not on file  Social History Narrative  . Not on file   Social Determinants of Health   Financial Resource Strain: Low Risk   . Difficulty of Paying Living Expenses: Not hard at all  Food Insecurity: No Food Insecurity  . Worried About Charity fundraiser in the Last Year: Never true  . Ran Out of Food in the Last Year: Never true  Transportation Needs: No Transportation Needs  . Lack of Transportation (Medical): No  . Lack of Transportation (Non-Medical): No  Physical Activity: Insufficiently Active  . Days of Exercise per Week: 3 days  . Minutes of Exercise per Session: 30 min  Stress: No Stress Concern Present  . Feeling of Stress : Only a little  Social Connections: Moderately Isolated  . Frequency of Communication with Friends and Family: Twice a week  . Frequency of Social Gatherings with Friends and Family: Three times a week  .  Attends Religious Services: Never  . Active Member of Clubs or Organizations: No  . Attends Archivist Meetings: Never  . Marital Status: Married    Tobacco Counseling Counseling given: Not Answered   Clinical Intake:  Pre-visit preparation completed: Yes  Pain : 0-10 Pain Score: 3  Pain Type: Chronic pain Pain Location: Back Pain Orientation: Lower Pain Descriptors / Indicators: Shooting Pain Onset: More than a month ago Pain Frequency: Intermittent Pain Relieving Factors: Heat, and lidocaine patches  Pain Relieving Factors: Heat, and lidocaine patches  Nutritional Risks: Nausea/ vomitting/ diarrhea (occassional diarrhea) Diabetes: Yes CBG done?: No Did pt. bring in CBG monitor from home?: No (Patient states checks his blood sugars every morning . Runs about 130-140)  How often do you need to have someone help you when you read instructions, pamphlets, or other written materials from your doctor or pharmacy?: 1 - Never What is the last grade level you completed in  school?: MBA  Diabetic?Yes  Interpreter Needed?: No  Information entered by :: [place] of Daily Living In your present state of health, do you have any difficulty performing the following activities: 02/27/2020  Hearing? N  Vision? N  Difficulty concentrating or making decisions? N  Walking or climbing stairs? Y  Comment Has right hip pain, and lower back pain  Dressing or bathing? N  Doing errands, shopping? N  Preparing Food and eating ? N  Using the Toilet? N  In the past six months, have you accidently leaked urine? Y  Comment Has occasional urine leakage with urgency  Do you have problems with loss of bowel control? N  Managing your Medications? N  Managing your Finances? N  Housekeeping or managing your Housekeeping? N  Some recent data might be hidden    Patient Care Team: Billie Ruddy, MD as PCP - General (Family Medicine) Park Liter, MD as PCP - Cardiology (Cardiology)  Indicate any recent Medical Services you may have received from other than [facility] providers in the past year (date may be approximate).     Assessment:   This is a routine wellness examination for Alexys.  Hearing/Vision screen  Hearing Screening   '125Hz'  '250Hz'  '500Hz'  '1000Hz'  '2000Hz'  '3000Hz'  '4000Hz'  '6000Hz'  '8000Hz'   Right ear:           Left ear:           Vision Screening Comments: Gets eye checked annually   Dietary issues and exercise activities discussed: Current Exercise Habits: Home exercise routine, Type of exercise: walking, Time (Minutes): 30, Frequency (Times/Week): 4, Weekly Exercise (Minutes/Week): 120, Intensity: Mild, Exercise limited by: orthopedic condition(s)  Goals    . patient     May increase exercise; has equipment in home;  Has a treadmill; has rowing machine and may try this     . Patient Stated     I will continue to take my medications as prescribed     . Weight (lb) < 215 lb (97.5 kg)     Exercise more now  Check out  online nutrition  programs as GumSearch.nl and http://vang.com/; fit68m; Look for foods with "whole" wheat; bran; oatmeal etc Shot at the farmer's markets in season for fresher choices  Watch for "hydrogenated" on the label of oils which are trans-fats.  Watch for "high fructose corn syrup" in snacks, yogurt or ketchup  Meats have less marbling; bright colored fruits and vegetables;  Canned; dump out liquid and wash vegetables. Be mindful of what we  are eating  Portion control is essential to a health weight! Sit down; take a break and enjoy your meal; take smaller bites; put the fork down between bites;  It takes 20 minutes to get full; so check in with your fullness cues and stop eating when you start to fill full             Depression Screen PHQ 2/9 Scores 02/27/2020 05/15/2017 08/15/2016 10/04/2015 03/03/2015 08/11/2014  PHQ - 2 Score 0 0 0 0 0 0  PHQ- 9 Score 0 0 - - - -    Fall Risk Fall Risk  02/27/2020 05/15/2017 08/15/2016 10/04/2015 03/03/2015  Falls in the past year? 0 No No No No  Number falls in past yr: 0 - - - -  Injury with Fall? 0 - - - -  Risk for fall due to : Medication side effect;Orthopedic patient - - - -  Follow up Falls evaluation completed;Falls prevention discussed - - - -    Any stairs in or around the home? Yes  If so, are there any without handrails? No  Home free of loose throw rugs in walkways, pet beds, electrical cords, etc? Yes  Adequate lighting in your home to reduce risk of falls? Yes   ASSISTIVE DEVICES UTILIZED TO PREVENT FALLS:  Life alert? No  Use of a cane, walker or w/c? No  Grab bars in the bathroom? No  Shower chair or bench in shower? Yes  Elevated toilet seat or a handicapped toilet? No     Cognitive Function:     6CIT Screen 02/27/2020 08/15/2016  What Year? 0 points 0 points  What month? 0 points 0 points  What time? 0 points 0 points  Count back from 20 0 points 0 points  Months in reverse 0 points 0 points  Repeat phrase 2 points 0  points  Total Score 2 0    Immunizations Immunization History  Administered Date(s) Administered  . Fluad Quad(high Dose 65+) 04/25/2019  . Influenza Split 07/13/2011, 05/15/2012  . Influenza Whole 05/09/2008, 05/10/2009, 04/18/2010  . Influenza, High Dose Seasonal PF 05/30/2016, 04/30/2018  . Influenza,inj,Quad PF,6+ Mos 04/22/2013, 05/07/2014, 05/07/2015, 05/15/2017  . PFIZER SARS-COV-2 Vaccination 08/20/2019, 09/09/2019  . Pneumococcal Conjugate-13 05/07/2014  . Pneumococcal Polysaccharide-23 06/22/2015  . Td 08/07/2002, 08/07/2006  . Tdap 05/07/2014  . Zoster 01/30/2011    TDAP status: Up to date Flu Vaccine status: Up to date Pneumococcal vaccine status: Up to date Covid-19 vaccine status: Completed vaccines  Qualifies for Shingles Vaccine? Yes   Zostavax completed Yes   Shingrix Completed?: No.    Education has been provided regarding the importance of this vaccine. Patient has been advised to call insurance company to determine out of pocket expense if they have not yet received this vaccine. Advised may also receive vaccine at local pharmacy or Health Dept. Verbalized acceptance and understanding.  Screening Tests Health Maintenance  Topic Date Due  . FOOT EXAM  11/15/2016  . COLONOSCOPY  09/02/2017  . INFLUENZA VACCINE  03/07/2020  . HEMOGLOBIN A1C  07/07/2020  . OPHTHALMOLOGY EXAM  10/08/2020  . TETANUS/TDAP  05/07/2024  . COVID-19 Vaccine  Completed  . Hepatitis C Screening  Completed  . PNA vac Low Risk Adult  Completed    Health Maintenance  Health Maintenance Due  Topic Date Due  . FOOT EXAM  11/15/2016  . COLONOSCOPY  09/02/2017    Colorectal cancer screening: Completed 09/02/2012. Repeat every 10 years  Lung Cancer Screening: (Low  Dose CT Chest recommended if Age 77-80 years, 30 pack-year currently smoking OR have quit w/in 15years.) does not qualify.   Lung Cancer Screening Referral: N/A  Additional Screening:  Hepatitis C Screening: does  qualify; Completed 02/15/2016  Vision Screening: Recommended annual ophthalmology exams for early detection of glaucoma and other disorders of the eye. Is the patient up to date with their annual eye exam?  Yes  Who is the provider or what is the name of the office in which the patient attends annual eye exams? [facility]  If pt is not established with a provider, would they like to be referred to a provider to establish care? No .   Dental Screening: Recommended annual dental exams for proper oral hygiene  Community Resource Referral / Chronic Care Management: CRR required this visit?  No   CCM required this visit?  No      Plan:     I have personally reviewed and noted the following in the patient's chart:   . Medical and social history . Use of alcohol, tobacco or illicit drugs  . Current medications and supplements . Functional ability and status . Nutritional status . Physical activity . Advanced directives . List of other physicians . Hospitalizations, surgeries, and ER visits in previous 12 months . Vitals . Screenings to include cognitive, depression, and falls . Referrals and appointments  In addition, I have reviewed and discussed with patient certain preventive protocols, quality metrics, and best practice recommendations. A written personalized care plan for preventive services as well as general preventive health recommendations were provided to patient.     Ofilia Neas, LPN   03/21/9469   Nurse Notes: Patient has concerns of lower back pain, and hip pain that he states affects his gait periodically. He would like to know about having xrays ordered. Also he has concerns of frequent urination would like to discuss treatment or additional testing to determine cause. Currently due for his colonoscopy orders to be placed this visit to [facility].

## 2020-03-07 DIAGNOSIS — J9610 Chronic respiratory failure, unspecified whether with hypoxia or hypercapnia: Secondary | ICD-10-CM | POA: Diagnosis not present

## 2020-04-07 DIAGNOSIS — J9610 Chronic respiratory failure, unspecified whether with hypoxia or hypercapnia: Secondary | ICD-10-CM | POA: Diagnosis not present

## 2020-04-22 ENCOUNTER — Ambulatory Visit: Payer: Medicare HMO | Admitting: Internal Medicine

## 2020-04-22 ENCOUNTER — Encounter: Payer: Self-pay | Admitting: Internal Medicine

## 2020-04-22 ENCOUNTER — Other Ambulatory Visit: Payer: Self-pay

## 2020-04-22 VITALS — BP 126/70 | HR 68 | Ht 70.0 in | Wt 214.0 lb

## 2020-04-22 DIAGNOSIS — E042 Nontoxic multinodular goiter: Secondary | ICD-10-CM

## 2020-04-22 DIAGNOSIS — E1165 Type 2 diabetes mellitus with hyperglycemia: Secondary | ICD-10-CM | POA: Diagnosis not present

## 2020-04-22 DIAGNOSIS — R200 Anesthesia of skin: Secondary | ICD-10-CM

## 2020-04-22 DIAGNOSIS — E782 Mixed hyperlipidemia: Secondary | ICD-10-CM

## 2020-04-22 LAB — POCT GLYCOSYLATED HEMOGLOBIN (HGB A1C): Hemoglobin A1C: 6.5 % — AB (ref 4.0–5.6)

## 2020-04-22 MED ORDER — LANTUS SOLOSTAR 100 UNIT/ML ~~LOC~~ SOPN
12.0000 [IU] | PEN_INJECTOR | Freq: Every day | SUBCUTANEOUS | 11 refills | Status: DC
Start: 2020-04-22 — End: 2020-08-18

## 2020-04-22 MED ORDER — INSULIN PEN NEEDLE 32G X 4 MM MISC
3 refills | Status: DC
Start: 2020-04-22 — End: 2020-08-02

## 2020-04-22 NOTE — Progress Notes (Signed)
Patient ID: Robert Cordova, male   DOB: [birth year], 69 y.o.   MRN: [id number]   This visit occurred during the SARS-CoV-2 public health emergency.  Safety protocols were in place, including screening questions prior to the visit, additional usage of staff PPE, and extensive cleaning of exam room while observing appropriate contact time as indicated for disinfecting solutions.   HPI: Robert Cordova is a 69 y.o.-year-old male, returning for f/u for DM2, dx in 2013, non-insulin-dependent, uncontrolled, with complications (h/o DKA). Last visit 3.5 months ago.  Reviewed HbA1c levels: Lab Results  Component Value Date   HGBA1C 7.2 (A) 01/06/2020   HGBA1C 7.5 (H) 07/23/2019   HGBA1C 6.9 (A) 04/25/2019  08/22/2016: 7.7%  He is on: - Metformin 1000 mg 2x a day, with meals >> 2000 mg with dinner - Glipizide 10 mg after dinner >> 10 mg before dinner - Jardiance 25 mg before b'fast - Ozempic 0.5 mg weekly-added 09/2018 >> 1 mg weekly - increased 09/2019 Of note, He was off and on insulin in the past. He came off when he lost weight after his lab band surgery in 2014 (45 pounds)  He is checking sugars 1-2 times a day: - am:  114, 115, 136-204, 221 >> 125, 136-179, 195 (cookies), 200, 208, 221 - 2h after b'fast: 87-125, 147 >> n/c >> 181, 215 >> n/c - before lunch: 98-142 >> 117, 131 >> 137, 141-200 >> 99-122 >> 148 - 2h after lunch: 100, 153 >> n/c >> 118-215 >> n/c >> 121 - before dinner: 90-124 >> n/c >> 125 >> 119 >> 108-134 >> 94, 138, 150 - 2h after dinner:328 (cheese cake) >> n/c >> 208 >> n/c - bedtime: 155-171 >> 144-188 >> 109, 132, 151, 269 (cookies) - nighttime: n/c >> 212 >> n/c  Lowest sugar was 114 >> 94; he has hypoglycemia awareness in the 70s. Highest sugar was 221 >> 221.  Pt's meals are: - Breakfast: Protein shake - Lunch: Eggs, bacon or leftovers - Dinner: Meat + veggies + starch, pizza, [place] food - Snacks: In the evening: cookies, etc.  -No CKD, last  BUN/creatinine:  Lab Results  Component Value Date   BUN 14 07/23/2019   BUN 21 05/07/2018   CREATININE 0.65 07/23/2019   CREATININE 0.77 05/07/2018  On losartan 25 twice a day. -+ HL; last set of lipids: Lab Results  Component Value Date   CHOL 118 07/23/2019   HDL 36.90 (L) 07/23/2019   LDLCALC 48 07/23/2019   LDLDIRECT 72.0 05/07/2018   TRIG 163.0 (H) 07/23/2019   CHOLHDL 3 07/23/2019  On Lipitor, co-Q10, Krill oil. - last eye exam was on 10/09/2019 (Dr. Nicki Reaper) and 10/21/2019 (Dr. Katherene Ponto): No DR, + incipient cataract. [facility]. -+ Numbness in left toes -a B12 level was checked at last visit and this was elevated.  I advised him to decrease the dose of his B12 supplement from 5000 to 2500 mcg daily.  Lab Results  Component Value Date   VITAMINB12 >1526 (H) 01/06/2020   Thyroid nodules: - s/p 2 benign biopsies in 2014  Pt denies: - feeling nodules in neck - hoarseness - dysphagia - choking - SOB with lying down  Reviewed thyroid imaging tests and biopsy results: Thyroid U/S (10/20/2016): Stable 2.8 cm solid nodule in the inferior right lobe.  This was previously sampled with negative biopsy.  The 1.8 cm nodule previously sampled in the right isthmus was no longer identified by ultrasound.  Thyroid U/S (01/15/2020): COMPARISON:  10/19/2016  FINDINGS: Parenchymal Echotexture: Mildly heterogenous Isthmus: 0.9 cm Right lobe: 6.1 x 2.6 x 1.9 cm. Left lobe: 5.4 x 2.3 x 1.3 cm _________________________________________________________  Nodule # 2: Prior biopsy: Yes Location: Right; Inferior Maximum size: 3.7 cm; Other 2 dimensions: 3.1 x 2.8 cm, previously, 2.8 x 2.6 x 2.1 cm Composition: solid/almost completely solid (2) Echogenicity: isoechoic (1) Significant change in size (>/= 20% in two dimensions and minimal increase of 2 mm): Yes  **Given size (>/= 2.5 cm) and appearance, fine needle aspiration of this mildly suspicious nodule should be considered  based on TI-RADS criteria. _______________________________________________________  There is a stable small hypoechoic nodule measuring approximately 7 mm in the right mid thyroid gland.  IMPRESSION: Interval increase in size of the dominant right-sided thyroid nodule, currently measuring 3.7 cm (previously measuring 2.8 cm). While this thyroid nodule was previously biopsied, consider repeat ultrasound or fine-needle aspiration given the nodule's significant interval growth.  Thyroid FNA (01/20/2020): FINAL MICROSCOPIC DIAGNOSIS:  - Consistent with benign follicular nodule (Bethesda category II)   SPECIMEN ADEQUACY:  Satisfactory for evaluation   Latest TSH was normal: Lab Results  Component Value Date   TSH 0.46 01/06/2020  He also has sleep-related hypoxia, HTN.  He has Medicare with 2 supplemental insurances.  ROS: Constitutional: no weight gain/no weight loss, no fatigue, no subjective hyperthermia, no subjective hypothermia Eyes: no blurry vision, no xerophthalmia ENT: no sore throat, + see HPI Cardiovascular: no CP/no SOB/no palpitations/no leg swelling Respiratory: no cough/no SOB/no wheezing Gastrointestinal: no N/no V/no D/no C/no acid reflux Musculoskeletal: no muscle aches/no joint aches Skin: no rashes, no hair loss Neurological: + tremors/+ numbness/no tingling/no dizziness  I reviewed pt's medications, allergies, PMH, social hx, family hx, and changes were documented in the history of present illness. Otherwise, unchanged from my initial visit note.   Past Medical History:  Diagnosis Date  . ABNORMAL GLUCOSE NEC 03/18/2007  . Arthritis    fingers  . BLEPHARITIS, LEFT 10/29/2009  . DIABETES-TYPE 2 04/18/2010  . Dry throat   . Environmental allergies   . GERD (gastroesophageal reflux disease)   . H/O hiatal hernia   . HYPERLIPIDEMIA 10/22/2007  . HYPERTENSION 03/18/2007  . Multiple thyroid nodules    "biopsy done, everything was fine" being monitored   . NEVUS, MELANOCYTIC, FACE 10/07/2009   Past Surgical History:  Procedure Laterality Date  . BIOPSY THYROID  ~08/2013  . BREATH TEK H PYLORI N/A 09/05/2013   Procedure: BREATH TEK H PYLORI;  Surgeon: [name], MD;  Location: [facility];  Service: General;  Laterality: N/A;  . CATARACT EXTRACTION Bilateral   . COLONOSCOPY  2009, 2013   Dr. Sharlett Iles; multiple polyps  . HIATAL HERNIA REPAIR  11/25/2013   Procedure: LAPAROSCOPIC REPAIR OF HIATAL HERNIA;  Surgeon: [name], MD;  Location: [facility];  Service: General;;  . LAPAROSCOPIC GASTRIC BANDING N/A 11/25/2013   Procedure: LAPAROSCOPIC GASTRIC BANDING;  Surgeon: [name], MD;  Location: [facility];  Service: General;  Laterality: N/A;  . NO PAST SURGERIES     Social History   Social History  . Marital status: Married    Spouse name: N/A  . Number of children: 2   Occupational History  . 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 Smoking status: Never Smoker  . Smokeless tobacco: Never Used  . Alcohol use No  . Drug use: No   Current Outpatient Medications on File Prior to Visit  Medication Sig Dispense  Refill  . aspirin 81 MG tablet Take 1 tablet (81 mg total) by mouth daily. 30 tablet 5  . atorvastatin (LIPITOR) 20 MG tablet Take 1 tablet (20 mg total) by mouth daily. 90 tablet 4  . Blood Glucose Monitoring Suppl (TRUE METRIX AIR GLUCOSE METER) w/Device KIT 1 Device by Other route as directed. 1 kit 0  . Cholecalciferol (VITAMIN D PO) Take 50 mcg by mouth daily.    . Coenzyme Q10 (CO Q-10) 200 MG CAPS Take by mouth daily.    . fluticasone (CUTIVATE) 0.005 % ointment SMARTSIG:Sparingly Topical Twice Daily PRN (Patient not taking: Reported on 02/27/2020)    . fluticasone (FLONASE) 50 MCG/ACT nasal spray Place 2 sprays into both nostrils daily. 48 g 11  . glipiZIDE (GLUCOTROL) 5 MG tablet TAKE 1 TABLET TWICE DAILY BEFORE MEALS 180 tablet 3  . glucose blood (TRUE METRIX BLOOD GLUCOSE  TEST) test strip Use to check blood sugar once a day. 100 each 12  . JARDIANCE 25 MG TABS tablet TAKE 1 TABLET EVERY DAY 90 tablet 1  . Lidocaine 1.8 % PTCH Apply topically.    [name] losartan (COZAAR) 50 MG tablet Take 1 tablet (50 mg total) by mouth daily. 90 tablet 4  . Magnesium 250 MG TABS Take by mouth daily.     . metFORMIN (GLUCOPHAGE) 500 MG tablet TAKE 2 TABLETS TWICE DAILY WITH MEALS 360 tablet 3  . minoxidil (ROGAINE) 2 % external solution Apply 1 application topically daily. 60 mL 10  . Multiple Vitamins-Minerals (MULTIVITAMIN WITH MINERALS) tablet Take 1 tablet by mouth daily.    Donell Sievert IN Inhale into the lungs. (Patient not taking: Reported on 02/27/2020)    . PROLENSA 0.07 % SOLN SMARTSIG:1 Drop(s) Left Eye Every Evening (Patient not taking: Reported on 02/27/2020)    . Semaglutide, 1 MG/DOSE, (OZEMPIC, 1 MG/DOSE,) 2 MG/1.5ML SOPN Inject 1 mg into the skin once a week. 6 pen 3  . TRUEPLUS LANCETS 33G MISC Use as instructed two times daily 100 each 12  . vitamin B-12 (CYANOCOBALAMIN) 100 MCG tablet Take 100 mcg by mouth daily.     No current facility-administered medications on file prior to visit.   Allergies  Allergen Reactions  . Ace Inhibitors     REACTION: Coughing   Family History  Problem Relation Age of Onset  . Hyperlipidemia Mother   . Hypertension Mother   . Arthritis Mother   . Cancer Mother   . Hyperlipidemia Father   . Hypertension Father   . Heart attack Father   . Hyperlipidemia Brother   . Diabetes Maternal Grandmother   . Diabetes Maternal Grandfather   . Colon cancer Neg Hx   . Stomach cancer Neg Hx   . Esophageal cancer Neg Hx   . Rectal cancer Neg Hx     PE: BP 126/70   Pulse 68   Ht _0  (1.778 m)   Wt 214 lb (97.1 kg)   SpO2 98%   BMI 30.71 kg/m  Wt Readings from Last 3 Encounters:  04/22/20 214 lb (97.1 kg)  01/06/20 216 lb (98 kg)  08/28/19 220 lb (99.8 kg)   Constitutional: overweight, in NAD Eyes: PERRLA, EOMI, no  exophthalmos ENT: moist mucous membranes, no thyromegaly but approximately 2.5 cm thyroid nodule palpable in the right inferior thyroid, no cervical lymphadenopathy Cardiovascular: RRR, No MRG Respiratory: CTA B Gastrointestinal: abdomen soft, NT, ND, BS+ Musculoskeletal: no deformities, strength intact in all 4 Skin: moist, warm, no rashes Neurological:  no tremor with outstretched hands, DTR normal in all 4   ASSESSMENT: 1. DM2, non-insulin-dependent, uncontrolled, without long term complications, but with hyperglycemia - h/o DKA  2.  Multinodular goiter  3. HL  4.  Numbness in toes  PLAN:  1. Patient with longstanding, uncontrolled, type 2 diabetes, on oral antidiabetic regimen with Metformin, sulfonylurea, SGLT2 inhibitor, and also weekly GLP-1 receptor agonist.  His HbA1c at last visit was slightly better, at 7.2%.  At that time sugars were at goal later in the day but they were still above target in the morning.  He was having snacks at night and we discussed about stopping these.  We also discussed about the option of starting long-acting insulin, but he wanted to work on his diet first.  I also advised him to check some more sugars before and after dinner. - at this visit, per review of his meter downloads, his sugars are still high in am, even when he does not snack at night >> 140-170s. When he does snack >> 180s-200s. At this point, I again d/w him about possible reasons for this but also recommended again basal insulin. He agrees >> will start Lantus low dose and increase as needed for a target am CBG of <130 - I suggested to:  Patient Instructions  Please continue:  - Metformin 2000 mg with dinner - Glipizide 10 mg before dinner - Jardiance 25 mg before b'fast - Ozempic 1 mg weekly  Please start: - Lantus 12 units at bedtime (may need to increase by 2 units every 2 days until am sugars <130)  When injecting insulin:  Inject in the abdomen  Rotate the injection sites  around the belly button  Change needle for each injection  Keep needle in for 10 sec after last unit of insulin in  Keep the insulin in use out of the fridge  Please return in 3-4 months with your sugar log  - we checked his HbA1c: 6.5% (improved!) - advised to check sugars at different times of the day - 1-2x a day, rotating check times - advised for yearly eye exams >> he is UTD - return to clinic in 3-4 months  2. Thyroid nodules and goiter -He has a history of 2 thyroid nodules that have been evaluated by FNA in 2014, with benign results. -Reviewed the ultrasound report from 2018: Stable nodules.  However, we repeated another ultrasound at last visit (01/15/2020) and this showed an increase in the right inferior dominant nodule size from 2.8 to 3.7 cm.  An FNA obtained on 01/20/2020 was benign. -TSH was normal at last visit -No neck compression symptoms  3. HL -Reviewed latest lipid panel from 07/2019: LDL at goal, triglycerides slightly high, HDL low: Lab Results  Component Value Date   CHOL 118 07/23/2019   HDL 36.90 (L) 07/23/2019   LDLCALC 48 07/23/2019   LDLDIRECT 72.0 05/07/2018   TRIG 163.0 (H) 07/23/2019   CHOLHDL 3 07/23/2019  -He continues on Lipitor, co-Q10, Krill oil, without side effects  4.  Elevated B12 -We checked a B12 at last visit since she complained of numbness in his hands.  This was elevated. -I advised him to decrease the B12 supplement (5000 mcg daily) to 2500 mcg daily - will check the level at next OV   Philemon Kingdom, MD PhD [facility]

## 2020-04-22 NOTE — Patient Instructions (Addendum)
Please continue:  - Metformin 2000 mg with dinner - Glipizide 10 mg before dinner - Jardiance 25 mg before b'fast - Ozempic 1 mg weekly  Please start: - Lantus 12 units at bedtime (may need to increase by 2 units every 2 days until am sugars <130)  When injecting insulin:  Inject in the abdomen  Rotate the injection sites around the belly button  Change needle for each injection  Keep needle in for 10 sec after last unit of insulin in  Keep the insulin in use out of the fridge  Please return in 3-4 months with your sugar log

## 2020-04-22 NOTE — Addendum Note (Signed)
Addended by: Cardell Peach I on: 04/22/2020 11:13 AM   Modules accepted: Orders

## 2020-05-07 DIAGNOSIS — J9610 Chronic respiratory failure, unspecified whether with hypoxia or hypercapnia: Secondary | ICD-10-CM | POA: Diagnosis not present

## 2020-05-20 ENCOUNTER — Other Ambulatory Visit: Payer: Self-pay

## 2020-05-20 ENCOUNTER — Ambulatory Visit (INDEPENDENT_AMBULATORY_CARE_PROVIDER_SITE_OTHER): Payer: Medicare HMO

## 2020-05-20 ENCOUNTER — Other Ambulatory Visit: Payer: Self-pay | Admitting: Internal Medicine

## 2020-05-20 DIAGNOSIS — Z23 Encounter for immunization: Secondary | ICD-10-CM

## 2020-05-22 ENCOUNTER — Other Ambulatory Visit: Payer: Self-pay | Admitting: Family Medicine

## 2020-05-22 DIAGNOSIS — I10 Essential (primary) hypertension: Secondary | ICD-10-CM

## 2020-06-03 ENCOUNTER — Other Ambulatory Visit: Payer: Self-pay | Admitting: Internal Medicine

## 2020-06-07 DIAGNOSIS — J9610 Chronic respiratory failure, unspecified whether with hypoxia or hypercapnia: Secondary | ICD-10-CM | POA: Diagnosis not present

## 2020-06-07 NOTE — Telephone Encounter (Signed)
Error

## 2020-06-23 ENCOUNTER — Other Ambulatory Visit: Payer: Self-pay | Admitting: Family Medicine

## 2020-06-23 ENCOUNTER — Other Ambulatory Visit: Payer: Self-pay | Admitting: Internal Medicine

## 2020-06-23 DIAGNOSIS — E785 Hyperlipidemia, unspecified: Secondary | ICD-10-CM

## 2020-07-07 DIAGNOSIS — J9610 Chronic respiratory failure, unspecified whether with hypoxia or hypercapnia: Secondary | ICD-10-CM | POA: Diagnosis not present

## 2020-07-26 ENCOUNTER — Encounter: Payer: Medicare HMO | Admitting: Family Medicine

## 2020-08-02 ENCOUNTER — Other Ambulatory Visit: Payer: Self-pay

## 2020-08-02 DIAGNOSIS — E1165 Type 2 diabetes mellitus with hyperglycemia: Secondary | ICD-10-CM

## 2020-08-02 MED ORDER — INSULIN PEN NEEDLE 32G X 4 MM MISC: 3 refills | Status: DC

## 2020-08-02 NOTE — Telephone Encounter (Signed)
Inbound fax from [facility] requesting a refill of Nano Pen Needles.

## 2020-08-05 ENCOUNTER — Other Ambulatory Visit: Payer: Self-pay | Admitting: Family Medicine

## 2020-08-05 DIAGNOSIS — I10 Essential (primary) hypertension: Secondary | ICD-10-CM

## 2020-08-07 DIAGNOSIS — J9610 Chronic respiratory failure, unspecified whether with hypoxia or hypercapnia: Secondary | ICD-10-CM | POA: Diagnosis not present

## 2020-08-11 ENCOUNTER — Other Ambulatory Visit: Payer: Self-pay

## 2020-08-11 ENCOUNTER — Encounter: Payer: Self-pay | Admitting: Family Medicine

## 2020-08-11 ENCOUNTER — Ambulatory Visit (INDEPENDENT_AMBULATORY_CARE_PROVIDER_SITE_OTHER): Payer: Medicare HMO | Admitting: Family Medicine

## 2020-08-11 VITALS — BP 134/78 | HR 59 | Temp 98.1°F | Ht 70.0 in | Wt 220.6 lb

## 2020-08-11 DIAGNOSIS — M79645 Pain in left finger(s): Secondary | ICD-10-CM | POA: Diagnosis not present

## 2020-08-11 DIAGNOSIS — M542 Cervicalgia: Secondary | ICD-10-CM

## 2020-08-11 DIAGNOSIS — Z125 Encounter for screening for malignant neoplasm of prostate: Secondary | ICD-10-CM | POA: Diagnosis not present

## 2020-08-11 DIAGNOSIS — G8929 Other chronic pain: Secondary | ICD-10-CM

## 2020-08-11 DIAGNOSIS — E1169 Type 2 diabetes mellitus with other specified complication: Secondary | ICD-10-CM

## 2020-08-11 DIAGNOSIS — E782 Mixed hyperlipidemia: Secondary | ICD-10-CM

## 2020-08-11 DIAGNOSIS — Z0001 Encounter for general adult medical examination with abnormal findings: Secondary | ICD-10-CM

## 2020-08-11 DIAGNOSIS — M545 Low back pain, unspecified: Secondary | ICD-10-CM

## 2020-08-11 DIAGNOSIS — M79644 Pain in right finger(s): Secondary | ICD-10-CM

## 2020-08-11 LAB — CBC WITH DIFFERENTIAL/PLATELET
Basophils Absolute: 0.1 10*3/uL (ref 0.0–0.1)
Basophils Relative: 1.3 % (ref 0.0–3.0)
Eosinophils Absolute: 0.1 10*3/uL (ref 0.0–0.7)
Eosinophils Relative: 2.6 % (ref 0.0–5.0)
HCT: 43.8 % (ref 39.0–52.0)
Hemoglobin: 14.8 g/dL (ref 13.0–17.0)
Lymphocytes Relative: 28.1 % (ref 12.0–46.0)
Lymphs Abs: 1.6 10*3/uL (ref 0.7–4.0)
MCHC: 33.7 g/dL (ref 30.0–36.0)
MCV: 92.1 fl (ref 78.0–100.0)
Monocytes Absolute: 0.6 10*3/uL (ref 0.1–1.0)
Monocytes Relative: 10.9 % (ref 3.0–12.0)
Neutro Abs: 3.3 10*3/uL (ref 1.4–7.7)
Neutrophils Relative %: 57.1 % (ref 43.0–77.0)
Platelets: 309 10*3/uL (ref 150.0–400.0)
RBC: 4.75 Mil/uL (ref 4.22–5.81)
RDW: 12.9 % (ref 11.5–15.5)
WBC: 5.7 10*3/uL (ref 4.0–10.5)

## 2020-08-11 LAB — COMPREHENSIVE METABOLIC PANEL
ALT: 20 U/L (ref 0–53)
AST: 15 U/L (ref 0–37)
Albumin: 4.5 g/dL (ref 3.5–5.2)
Alkaline Phosphatase: 57 U/L (ref 39–117)
BUN: 17 mg/dL (ref 6–23)
CO2: 29 mEq/L (ref 19–32)
Calcium: 9.9 mg/dL (ref 8.4–10.5)
Chloride: 105 mEq/L (ref 96–112)
Creatinine, Ser: 0.75 mg/dL (ref 0.40–1.50)
GFR: 91 mL/min (ref >60.00)
Glucose, Bld: 113 mg/dL — ABNORMAL HIGH (ref 70–99)
Potassium: 4.7 mEq/L (ref 3.5–5.1)
Sodium: 141 mEq/L (ref 135–145)
Total Bilirubin: 0.4 mg/dL (ref 0.2–1.2)
Total Protein: 7 g/dL (ref 6.0–8.3)

## 2020-08-11 LAB — LIPID PANEL
Cholesterol: 114 mg/dL (ref 0–200)
HDL: 38.2 mg/dL — ABNORMAL LOW (ref >39.00)
LDL Cholesterol: 49 mg/dL (ref 0–99)
NonHDL: 76.11
Total CHOL/HDL Ratio: 3
Triglycerides: 137 mg/dL (ref 0.0–149.0)
VLDL: 27.4 mg/dL (ref 0.0–40.0)

## 2020-08-11 LAB — HEMOGLOBIN A1C: Hgb A1c MFr Bld: 6.9 % — ABNORMAL HIGH (ref 4.6–6.5)

## 2020-08-11 LAB — PSA: PSA: 2.87 ng/mL (ref 0.10–4.00)

## 2020-08-11 NOTE — Progress Notes (Signed)
Subjective:     Robert Cordova is a 70 y.o. male and is here for a comprehensive physical exam. The patient reports stiffness in the low back L >R, neck, bilateral shoulders L>R, and bilateral thumbs.   Notes h/o of playing basketball and frequent use of a blackberry in the past.  Tried heat, lidocaine patches.  Pain worse in the evening.  May also notes stiffness in low back when getting out of a chair.  Pain in neck more from muscle spasm.  Pain in shoulders noted with overhead movements.  Pt taking lipitor 20 mg daily. Patient seen by endocrinology for diabetes.  States blood sugar has been stable.  Patient had a Pfizer COVID booster 09/09/2019.  Social History   Socioeconomic History  . Marital status: Married    Spouse name: Not on file  . Number of children: Not on file  . Years of education: Not on file  . Highest education level: Not on file  Occupational History  . Occupation: Research scientist (medical)-- self-employeed  Tobacco Use  . Smoking status: Never Smoker  . Smokeless tobacco: Never Used  Vaping Use  . Vaping Use: Never used  Substance and Sexual Activity  . Alcohol use: No  . Drug use: No  . Sexual activity: Not on file  Other Topics Concern  . Not on file  Social History Narrative  . Not on file   Social Determinants of Health   Financial Resource Strain: Low Risk   . Difficulty of Paying Living Expenses: Not hard at all  Food Insecurity: No Food Insecurity  . Worried About Programme researcher, broadcasting/film/video in the Last Year: Never true  . Ran Out of Food in the Last Year: Never true  Transportation Needs: No Transportation Needs  . Lack of Transportation (Medical): No  . Lack of Transportation (Non-Medical): No  Physical Activity: Insufficiently Active  . Days of Exercise per Week: 3 days  . Minutes of Exercise per Session: 30 min  Stress: No Stress Concern Present  . Feeling of Stress : Only a little  Social Connections: Moderately Isolated  . Frequency of Communication with  Friends and Family: Twice a week  . Frequency of Social Gatherings with Friends and Family: Three times a week  . Attends Religious Services: Never  . Active Member of Clubs or Organizations: No  . Attends Banker Meetings: Never  . Marital Status: Married  Catering manager Violence: Not At Risk  . Fear of Current or Ex-Partner: No  . Emotionally Abused: No  . Physically Abused: No  . Sexually Abused: No   Health Maintenance  Topic Date Due  . FOOT EXAM  11/15/2016  . COLONOSCOPY (Pts 45-30yrs Insurance coverage will need to be confirmed)  09/02/2017  . COVID-19 Vaccine (3 - Pfizer risk 4-dose series) 10/07/2019  . OPHTHALMOLOGY EXAM  10/08/2020  . HEMOGLOBIN A1C  10/20/2020  . TETANUS/TDAP  05/07/2024  . INFLUENZA VACCINE  Completed  . Hepatitis C Screening  Completed  . PNA vac Low Risk Adult  Completed    The following portions of the patient's history were reviewed and updated as appropriate: allergies, current medications, past family history, past medical history, past social history, past surgical history and problem list.  Review of Systems Pertinent items noted in HPI and remainder of comprehensive ROS otherwise negative.   Objective:    BP 134/78 (BP Location: Left Arm, Patient Position: Sitting, Cuff Size: Normal)   Pulse (!) 59   Temp 98.1 F (36.7  C) (Oral)   Ht 5\' 10"  (1.778 m)   Wt 220 lb 9.6 oz (100.1 kg)   SpO2 97%   BMI 31.65 kg/m  General appearance: alert, cooperative and no distress Head: Normocephalic, without obvious abnormality, atraumatic Eyes: conjunctivae/corneas clear. PERRL, EOM's intact. Fundi benign. Ears: normal TM's and external ear canals both ears Nose: Nares normal. Septum midline. Mucosa normal. No drainage or sinus tenderness. Throat: lips, mucosa, and tongue normal; teeth and gums normal Neck: no adenopathy, no carotid bruit, no JVD, supple, symmetrical, trachea midline and thyroid not enlarged, symmetric, no  tenderness/mass/nodules Lungs: clear to auscultation bilaterally Heart: regular rate and rhythm, S1, S2 normal, no murmur, click, rub or gallop Abdomen: soft, non-tender; bowel sounds normal; no masses,  no organomegaly Extremities: extremities normal, atraumatic, no cyanosis or edema  No TTP of cervical, thoracic, lumbar spine, or paraspinal muscles.  Mild TTP of left rotator cuff and AC joint.  Full active ROM, positive Hawkin and Neer's.  Negative cross arm.  No TTP of the long head of biceps brachii or clavicles. Pulses: 2+ and symmetric Skin: Skin color, texture, turgor normal. No rashes or lesions Lymph nodes: Cervical, supraclavicular, and axillary nodes normal. Neurologic: Alert and oriented X 3, normal strength and tone. Normal symmetric reflexes. Normal coordination and gait    Assessment:    Healthy male exam.      Plan:     Anticipatory guidance given including wearing seatbelts, smoke detectors in the home, increasing physical activity, increasing p.o. intake of water and vegetables. -We will obtain labs -Vaccines up-to-date including COVID booster done 05/08/2020 -Colonoscopy up-to-date-done 09/02/2012 -Given handout -Next CPE in 1 year See After Visit Summary for Counseling Recommendations    Chronic bilateral low back pain without sciatica -Discussed possible causes including arthritis versus disc disease. -Discussed supportive care including heat, stretching, massage, NSAIDs or Tylenol -Consider imaging and referral to PT for continued or worsening symptoms  Cervical pain (neck) -Discussed possible causes skeletal versus muscular -Discussed supportive care -Discussed considering imaging and referral to PT  Bilateral thumb pain -Likely 2/2 arthritis -Discussed limiting repetitive motions -Discussed supportive care  Prostate cancer screening - Plan: PSA  Mixed hyperlipidemia -continue Lipitor 20 mg. -Consider switching statin for continued or worsening muscle  cramps if potassium is normal - Plan: Lipid panel  Type 2 diabetes mellitus with other specified complication, without long-term current use of insulin (HCC) -Continue current medications pleating Ozempic 1 mg weekly, glipizide 5 mg twice daily, Metformin 1000 mg twice daily, Jardiance 25 mg -Continue lifestyle modifications -Continue follow-up with endocrinology, Dr. Cruzita Lederer - Plan: CMP, Hemoglobin A1c  F/u prn  Grier Mitts, MD

## 2020-08-11 NOTE — Patient Instructions (Addendum)
You can use Voltaren gel for your shoulder pain.  It can be found over the counter at your local drug store and can be applied up to 4 times per day.  Preventive Care 72 Years and Older, Male Preventive care refers to lifestyle choices and visits with your health care provider that can promote health and wellness. This includes:  A yearly physical exam. This is also called an annual well check.  Regular dental and eye exams.  Immunizations.  Screening for certain conditions.  Healthy lifestyle choices, such as diet and exercise. What can I expect for my preventive care visit? Physical exam Your health care provider will check:  Height and weight. These may be used to calculate body mass index (BMI), which is a measurement that tells if you are at a healthy weight.  Heart rate and blood pressure.  Your skin for abnormal spots. Counseling Your health care provider may ask you questions about:  Alcohol, tobacco, and drug use.  Emotional well-being.  Home and relationship well-being.  Sexual activity.  Eating habits.  History of falls.  Memory and ability to understand (cognition).  Work and work Statistician. What immunizations do I need?  Influenza (flu) vaccine  This is recommended every year. Tetanus, diphtheria, and pertussis (Tdap) vaccine  You may need a Td booster every 10 years. Varicella (chickenpox) vaccine  You may need this vaccine if you have not already been vaccinated. Zoster (shingles) vaccine  You may need this after age 72. Pneumococcal conjugate (PCV13) vaccine  One dose is recommended after age 72. Pneumococcal polysaccharide (PPSV23) vaccine  One dose is recommended after age 72. Measles, mumps, and rubella (MMR) vaccine  You may need at least one dose of MMR if you were born in 1959 or later. You may also need a second dose. Meningococcal conjugate (MenACWY) vaccine  You may need this if you have certain conditions. Hepatitis A  vaccine  You may need this if you have certain conditions or if you travel or work in places where you may be exposed to hepatitis A. Hepatitis B vaccine  You may need this if you have certain conditions or if you travel or work in places where you may be exposed to hepatitis B. Haemophilus influenzae type b (Hib) vaccine  You may need this if you have certain conditions. You may receive vaccines as individual doses or as more than one vaccine together in one shot (combination vaccines). Talk with your health care provider about the risks and benefits of combination vaccines. What tests do I need? Blood tests  Lipid and cholesterol levels. These may be checked every 5 years, or more frequently depending on your overall health.  Hepatitis C test.  Hepatitis B test. Screening  Lung cancer screening. You may have this screening every year starting at age 27 if you have a 30-pack-year history of smoking and currently smoke or have quit within the past 15 years.  Colorectal cancer screening. All adults should have this screening starting at age 25 and continuing until age 32. Your health care provider may recommend screening at age 13 if you are at increased risk. You will have tests every 1-10 years, depending on your results and the type of screening test.  Prostate cancer screening. Recommendations will vary depending on your family history and other risks.  Diabetes screening. This is done by checking your blood sugar (glucose) after you have not eaten for a while (fasting). You may have this done every 1-3 years.  Abdominal aortic aneurysm (AAA) screening. You may need this if you are a current or former smoker.  Sexually transmitted disease (STD) testing. Follow these instructions at home: Eating and drinking  Eat a diet that includes fresh fruits and vegetables, whole grains, lean protein, and low-fat dairy products. Limit your intake of foods with high amounts of sugar, saturated  fats, and salt.  Take vitamin and mineral supplements as recommended by your health care provider.  Do not drink alcohol if your health care provider tells you not to drink.  If you drink alcohol: ? Limit how much you have to 0-2 drinks a day. ? Be aware of how much alcohol is in your drink. In the [place] one drink equals one 12 oz bottle of beer (355 mL), one 5 oz glass of wine (148 mL), or one 1 oz glass of hard liquor (44 mL). Lifestyle  Take daily care of your teeth and gums.  Stay active. Exercise for at least 30 minutes on 5 or more days each week.  Do not use any products that contain nicotine or tobacco, such as cigarettes, e-cigarettes, and chewing tobacco. If you need help quitting, ask your health care provider.  If you are sexually active, practice safe sex. Use a condom or other form of protection to prevent STIs (sexually transmitted infections).  Talk with your health care provider about taking a low-dose aspirin or statin. What's next?  Visit your health care provider once a year for a well check visit.  Ask your health care provider how often you should have your eyes and teeth checked.  Stay up to date on all vaccines. This information is not intended to replace advice given to you by your health care provider. Make sure you discuss any questions you have with your health care provider. Document Revised: 07/18/2018 Document Reviewed: 07/18/2018 Elsevier Patient Education  2020 [place].  What You Need to Know About Chronic Back Pain Long-term (chronic) back pain is back pain that lasts for 12 weeks or longer. It often affects the lower back and can range from mild to severe. Many people have back pain at some point in their lives. It can feel different to each person. It may feel like a muscle ache or a sharp, stabbing pain. The pain often gets worse over time. It can be difficult to find the cause of chronic back pain. Treating chronic back pain often starts  with rest and pain relief, followed by exercises (physical therapy) to strengthen the muscles that support your back. You may have to try different things to see what works best for you. If other treatments do not help, or if your pain is caused by a condition or an injury, you may need surgery. How can back pain affect me? Chronic back pain is uncomfortable and can make it hard to do your usual daily activities. Chronic back pain can:  Cause numbness and tingling.  Come and go.  Get worse when you are sitting, standing, walking, bending, or lifting.  Affect you while you are active, at rest, or both.  Eventually make it hard to move around.  Occur with fever, weight loss, or difficulty urinating. What are the benefits of treating back pain? Treating chronic back pain may:  Relieve pain.  Keep your pain from getting worse.  Make it easier for you to do your usual activities. What are some steps I can take to decrease my back pain?   Take over-the-counter or prescription medicines only  as told by your health care provider.  If directed, apply heat to the affected area. Use the heat source that your health care provider recommends, such as a moist heat pack or a heating pad. ? Place a towel between your skin and the heat source. ? Leave the heat on for 20-30 minutes. ? Remove the heat if your skin turns bright red. This is especially important if you are unable to feel pain, heat, or cold. You may have a greater risk of getting burned.  If directed, put ice on the affected area: ? Put ice in a plastic bag. ? Place a towel between your skin and the bag. ? Leave the ice on for 20 minutes, 2-3 times a day.  Get regular exercise as told by your health care provider to improve flexibility and strength.  Do not smoke.  Maintain a healthy weight.  When lifting objects: ? Keep your feet as far apart as your shoulders (shoulder-width apart) or farther apart. ? Tighten the muscles  in your abdomen. ? Bend your knees and hips and keep your spine neutral. It is important to lift using the strength of your legs, not your back. Do not lock your knees straight out. ? Always ask for help to lift heavy or awkward objects. What can happen if my back pain goes untreated? Untreated back pain can:  Get worse over time.  Start to occur more often or at different times, such as when you are resting.  Cause posture problems.  Make it hard to move around (limit mobility). Where can I get support? Chronic back pain can be a frustrating condition to manage. It may help to talk with other people who are having a similar experience. Consider joining a support group for people dealing with chronic back pain. Ask your health care provider about support groups in your area. You can also find online and in-person support groups through:  The American Chronic Pain Association: [employer]uxe[employer]si  The [place] Pain Foundation: uspainfoundation.org/support-groups Contact a health care provider if:  Your symptoms do not get better or they get worse.  You have severe back pain.  You have chronic back pain and a fever.  You lose weight without trying.  You have difficulty urinating.  You experience numbness or tingling.  You develop new pain after an injury. Summary  Chronic back pain is often treated with rest, pain relief, and physical therapy.  Get regular exercise to improve your strength and flexibility.  Put heat and ice on the affected areas as directed by your health care provider.  Chronic back pain can be challenging to live with. Joining a support group may help you manage your condition. This information is not intended to replace advice given to you by your health care provider. Make sure you discuss any questions you have with your health care provider. Document Revised: 07/06/2017 Document Reviewed: 04/01/2016 Elsevier Patient Education  [place].  Dyslipidemia Dyslipidemia is an imbalance of waxy, fat-like substances (lipids) in the blood. The body needs lipids in small amounts. Dyslipidemia often involves a high level of cholesterol or triglycerides, which are types of lipids. Common forms of dyslipidemia include:  High levels of LDL cholesterol. LDL is the type of cholesterol that causes fatty deposits (plaques) to build up in the blood vessels that carry blood away from your heart (arteries).  Low levels of HDL cholesterol. HDL cholesterol is the type of cholesterol that protects against heart disease. High levels of HDL remove  the LDL buildup from arteries.  High levels of triglycerides. Triglycerides are a fatty substance in the blood that is linked to a buildup of plaques in the arteries. What are the causes? Primary dyslipidemia is caused by changes (mutations) in genes that are passed down through families (inherited). These mutations cause several types of dyslipidemia. Secondary dyslipidemia is caused by lifestyle choices and diseases that lead to dyslipidemia, such as:  Eating a diet that is high in animal fat.  Not getting enough exercise.  Having diabetes, kidney disease, liver disease, or thyroid disease.  Drinking large amounts of alcohol.  Using certain medicines. What increases the risk? You are more likely to develop this condition if you are an older man or if you are a woman who has gone through menopause. Other risk factors include:  Having a family history of dyslipidemia.  Taking certain medicines, including birth control pills, steroids, some diuretics, and beta-blockers.  Smoking cigarettes.  Eating a high-fat diet.  Having certain medical conditions such as diabetes, polycystic ovary syndrome (PCOS), kidney disease, liver disease, or hypothyroidism.  Not exercising regularly.  Being overweight or obese with too much belly fat. What are the signs or symptoms? In most cases,  dyslipidemia does not usually cause any symptoms. In severe cases, very high lipid levels can cause:  Fatty bumps under the skin (xanthomas).  White or gray ring around the black center (pupil) of the eye. Very high triglyceride levels can cause inflammation of the pancreas (pancreatitis). How is this diagnosed? Your health care provider may diagnose dyslipidemia based on a routine blood test (fasting blood test). Because most people do not have symptoms of the condition, this blood testing (lipid profile) is done on adults age 66 and older and is repeated every 5 years. This test checks:  Total cholesterol. This measures the total amount of cholesterol in your blood, including LDL cholesterol, HDL cholesterol, and triglycerides. A healthy number is below 200.  LDL cholesterol. The target number for LDL cholesterol is different for each person, depending on individual risk factors. Ask your health care provider what your LDL cholesterol should be.  HDL cholesterol. An HDL level of 60 or higher is best because it helps to protect against heart disease. A number below 50 for men or below 14 for women increases the risk for heart disease.  Triglycerides. A healthy triglyceride number is below 150. If your lipid profile is abnormal, your health care provider may do other blood tests. How is this treated? Treatment depends on the type of dyslipidemia that you have and your other risk factors for heart disease and stroke. Your health care provider will have a target range for your lipid levels based on this information. For many people, this condition may be treated by lifestyle changes, such as diet and exercise. Your health care provider may recommend that you:  Get regular exercise.  Make changes to your diet.  Quit smoking if you smoke. If diet changes and exercise do not help you reach your goals, your health care provider may also prescribe medicine to lower lipids. The most commonly  prescribed type of medicine lowers your LDL cholesterol (statin drug). If you have a high triglyceride level, your provider may prescribe another type of drug (fibrate) or an omega-3 fish oil supplement, or both. Follow these instructions at home:  Eating and drinking  Follow instructions from your health care provider or dietitian about eating or drinking restrictions.  Eat a healthy diet as told by your  health care provider. This can help you reach and maintain a healthy weight, lower your LDL cholesterol, and raise your HDL cholesterol. This may include: ? Limiting your calories, if you are overweight. ? Eating more fruits, vegetables, whole grains, fish, and lean meats. ? Limiting saturated fat, trans fat, and cholesterol.  If you drink alcohol: ? Limit how much you use. ? Be aware of how much alcohol is in your drink. In the [place] one drink equals one 12 oz bottle of beer (355 mL), one 5 oz glass of wine (148 mL), or one 1 oz glass of hard liquor (44 mL).  Do not drink alcohol if: ? Your health care provider tells you not to drink. ? You are pregnant, may be pregnant, or are planning to become pregnant. Activity  Get regular exercise. Start an exercise and strength training program as told by your health care provider. Ask your health care provider what activities are safe for you. Your health care provider may recommend: ? 30 minutes of aerobic activity 4-6 days a week. Brisk walking is an example of aerobic activity. ? Strength training 2 days a week. General instructions  Do not use any products that contain nicotine or tobacco, such as cigarettes, e-cigarettes, and chewing tobacco. If you need help quitting, ask your health care provider.  Take over-the-counter and prescription medicines only as told by your health care provider. This includes supplements.  Keep all follow-up visits as told by your health care provider. Contact a health care provider if:  You are: ? Having  trouble sticking to your exercise or diet plan. ? Struggling to quit smoking or control your use of alcohol. Summary  Dyslipidemia often involves a high level of cholesterol or triglycerides, which are types of lipids.  Treatment depends on the type of dyslipidemia that you have and your other risk factors for heart disease and stroke.  For many people, treatment starts with lifestyle changes, such as diet and exercise.  Your health care provider may prescribe medicine to lower lipids. This information is not intended to replace advice given to you by your health care provider. Make sure you discuss any questions you have with your health care provider. Document Revised: 03/18/2018 Document Reviewed: 02/22/2018 Elsevier Patient Education  [place].

## 2020-08-17 ENCOUNTER — Other Ambulatory Visit: Payer: Self-pay | Admitting: Internal Medicine

## 2020-08-24 NOTE — Progress Notes (Signed)
Patient ID: Robert Cordova, male   DOB: 01-Apr-1951, 70 y.o.   MRN: [id number]   This visit occurred during the SARS-CoV-2 public health emergency.  Safety protocols were in place, including screening questions prior to the visit, additional usage of staff PPE, and extensive cleaning of exam room while observing appropriate contact time as indicated for disinfecting solutions.   HPI: Robert Cordova is a 70 y.o.-year-old male, returning for f/u for DM2, dx in 2013, insulin-dependent since 04/2020, uncontrolled, with complications (h/o DKA). Last visit 4 months ago.  Reviewed HbA1c levels: Lab Results  Component Value Date   HGBA1C 6.9 (H) 08/11/2020   HGBA1C 6.5 (A) 04/22/2020   HGBA1C 7.2 (A) 01/06/2020  08/22/2016: 7.7%  He is on: - Metformin 1000 mg 2x a day, with meals >> 2000 mg with dinner - Glipizide 10 mg after dinner >> 10 mg before dinner - Jardiance 25 mg before b'fast - Ozempic 0.5 mg weekly-added 09/2018 >> 1 mg weekly- increased 09/2019 - Lantus 12 units at bedtime-started 04/2020 >> he varies the dose: 18-24 units (mostly 20 units) Of note, He was off and on insulin in the past. He came off when he lost weight after his lab band surgery in 2014 (45 pounds)  He is checking sugars 1-2 times a day - am:  114, 115, 136-204, 221 >> 125, 136-179, 195 (cookies), 200, 208, 221 >> 96-191 - 2h after b'fast: 87-125, 147 >> n/c >> 181, 215 >> n/c - before lunch: 98-142 >> 117, 131 >> 137, 141-200 >> 99-122 >> 148 >> n/c - 2h after lunch: 100, 153 >> n/c >> 118-215 >> n/c >> 121 >> n/c - before dinner: 90-124 >> n/c >> 125 >> 119 >> 108-134 >> 94, 138, 150 >> n/c - 2h after dinner:328 (cheese cake) >> n/c >> 208 >> n/c - bedtime: 155-171 >> 144-188 >> 109, 132, 151, 269 (cookies) >> 102-188, 199 - nighttime: n/c >> 212 >> n/c  Lowest sugar was 114 >> 94 >> 96; he has hypoglycemia awareness in the 70s. Highest sugar was 221 >> 221 >> 199.  Pt's meals are: - Breakfast:  Protein shake - Lunch: Eggs, bacon or leftovers - Dinner: Meat + veggies + starch, pizza, [place] food - Snacks: In the evening: cookies, etc.  -No CKD, last BUN/creatinine:  Lab Results  Component Value Date   BUN 17 08/11/2020   BUN 14 07/23/2019   CREATININE 0.75 08/11/2020   CREATININE 0.65 07/23/2019  On losartan 25 mg twice a day. -+ HL; last set of lipids: Lab Results  Component Value Date   CHOL 114 08/11/2020   HDL 38.20 (L) 08/11/2020   LDLCALC 49 08/11/2020   LDLDIRECT 72.0 05/07/2018   TRIG 137.0 08/11/2020   CHOLHDL 3 08/11/2020  On Lipitor 20, co-Q10, Krill oil - last eye exam was on 10/09/2019 (Dr. Nicki Reaper) and 10/21/2019 (Dr. Katherene Ponto): No DR, + incipient cataract. [facility]. -+ Numbness in the toes  B12 was elevated at last check-we increased his supplement dose from 5000 to 2500 mcg daily.  Reviewed his B12 level Lab Results  Component Value Date   VITAMINB12 >1526 (H) 01/06/2020   Thyroid nodules: - s/p 2 benign biopsies in 2014  Pt denies: - feeling nodules in neck - hoarseness - dysphagia - choking - SOB with lying down  Reviewed thyroid imaging results and biopsy results: Thyroid U/S (10/20/2016): Stable 2.8 cm solid nodule in the inferior right lobe.  This was previously sampled with negative biopsy.  The 1.8 cm nodule previously sampled in the right isthmus was no longer identified by ultrasound.  Thyroid U/S (01/15/2020): COMPARISON:  10/19/2016  FINDINGS: Parenchymal Echotexture: Mildly heterogenous Isthmus: 0.9 cm Right lobe: 6.1 x 2.6 x 1.9 cm. Left lobe: 5.4 x 2.3 x 1.3 cm _________________________________________________________  Nodule # 2: Prior biopsy: Yes Location: Right; Inferior Maximum size: 3.7 cm; Other 2 dimensions: 3.1 x 2.8 cm, previously, 2.8 x 2.6 x 2.1 cm Composition: solid/almost completely solid (2) Echogenicity: isoechoic (1) Significant change in size (>/= 20% in two dimensions and minimal increase of  2 mm): Yes  **Given size (>/= 2.5 cm) and appearance, fine needle aspiration of this mildly suspicious nodule should be considered based on TI-RADS criteria. _______________________________________________________  There is a stable small hypoechoic nodule measuring approximately 7 mm in the right mid thyroid gland.  IMPRESSION: Interval increase in size of the dominant right-sided thyroid nodule, currently measuring 3.7 cm (previously measuring 2.8 cm). While this thyroid nodule was previously biopsied, consider repeat ultrasound or fine-needle aspiration given the nodule's significant interval growth.  Thyroid FNA (01/20/2020): FINAL MICROSCOPIC DIAGNOSIS:  - Consistent with benign follicular nodule (Bethesda category II)   SPECIMEN ADEQUACY:  Satisfactory for evaluation   Latest TSH was normal: Lab Results  Component Value Date   TSH 0.46 01/06/2020   He also has sleep-related hypoxia, HTN.  He has Medicare with 2 supplemental insurances.  ROS: Constitutional: no weight gain/no weight loss, no fatigue, no subjective hyperthermia, no subjective hypothermia Eyes: no blurry vision, no xerophthalmia ENT: no sore throat, + see HPI Cardiovascular: no CP/no SOB/no palpitations/no leg swelling Respiratory: no cough/no SOB/no wheezing Gastrointestinal: no N/no V/no D/no C/no acid reflux Musculoskeletal: no muscle aches/no joint aches Skin: no rashes, no hair loss Neurological: + tremors/+ numbness/no tingling/no dizziness  I reviewed pt's medications, allergies, PMH, social hx, family hx, and changes were documented in the history of present illness. Otherwise, unchanged from my initial visit note.   Past Medical History:  Diagnosis Date   ABNORMAL GLUCOSE NEC 03/18/2007   Arthritis    fingers   BLEPHARITIS, LEFT 10/29/2009   DIABETES-TYPE 2 04/18/2010   Dry throat    Environmental allergies    GERD (gastroesophageal reflux disease)    H/O hiatal hernia     HYPERLIPIDEMIA 10/22/2007   HYPERTENSION 03/18/2007   Multiple thyroid nodules    "biopsy done, everything was fine" being monitored   NEVUS, MELANOCYTIC, FACE 10/07/2009   Past Surgical History:  Procedure Laterality Date   BIOPSY THYROID  ~08/2013   BREATH TEK H PYLORI N/A 09/05/2013   Procedure: BREATH TEK H PYLORI;  Surgeon: [name], MD;  Location: [facility];  Service: General;  Laterality: N/A;   CATARACT EXTRACTION Bilateral    COLONOSCOPY  2009, 2013   Dr. Sharlett Iles; multiple polyps   HIATAL HERNIA REPAIR  11/25/2013   Procedure: LAPAROSCOPIC REPAIR OF HIATAL HERNIA;  Surgeon: [name], MD;  Location: [facility];  Service: General;;   LAPAROSCOPIC GASTRIC BANDING N/A 11/25/2013   Procedure: LAPAROSCOPIC GASTRIC BANDING;  Surgeon: [name], MD;  Location: [facility];  Service: General;  Laterality: N/A;   NO PAST SURGERIES     Social History   Social History   Marital status: Married    Spouse name: N/A   Number of children: 2   Occupational History   Consultant-- [employer]    Social History Main Topics   Smoking status: Never Smoker   Smokeless tobacco: Never  Used   Alcohol use No   Drug use: No   Current Outpatient Medications on File Prior to Visit  Medication Sig Dispense Refill   aspirin 81 MG tablet Take 1 tablet (81 mg total) by mouth daily. 30 tablet 5   atorvastatin (LIPITOR) 20 MG tablet TAKE 1 TABLET EVERY DAY 90 tablet 0   Blood Glucose Monitoring Suppl (TRUE METRIX AIR GLUCOSE METER) w/Device KIT 1 Device by Other route as directed. 1 kit 0   Cholecalciferol (VITAMIN D PO) Take 50 mcg by mouth daily.     Coenzyme Q10 (CO Q-10) 200 MG CAPS Take by mouth daily.     glipiZIDE (GLUCOTROL) 5 MG tablet TAKE 1 TABLET TWICE DAILY BEFORE MEALS 180 tablet 3   glucose blood (TRUE METRIX BLOOD GLUCOSE TEST) test strip Use to check blood sugar once a day. 100 each 12   Insulin Pen Needle 32G X 4 MM MISC Use 1x a day  100 each 3   JARDIANCE 25 MG TABS tablet TAKE 1 TABLET EVERY DAY 90 tablet 1   LANTUS SOLOSTAR 100 UNIT/ML Solostar Pen INJECT 12 UNITS INTO THE SKIN AT BEDTIME. 15 mL 0   Lidocaine 1.8 % PTCH Apply topically.     losartan (COZAAR) 50 MG tablet TAKE 1 TABLET EVERY DAY 90 tablet 0   Magnesium 250 MG TABS Take by mouth daily.      metFORMIN (GLUCOPHAGE) 500 MG tablet TAKE 2 TABLETS TWICE DAILY WITH MEALS 360 tablet 3   minoxidil (ROGAINE) 2 % external solution Apply 1 application topically daily. 60 mL 10   Multiple Vitamins-Minerals (MULTIVITAMIN WITH MINERALS) tablet Take 1 tablet by mouth daily.     OXYGEN-HELIUM IN Inhale into the lungs.      OZEMPIC, 1 MG/DOSE, 4 MG/3ML SOPN INJECT 1 MG INTO THE SKIN ONCE A WEEK. 9 mL 0   Semaglutide, 1 MG/DOSE, (OZEMPIC, 1 MG/DOSE,) 2 MG/1.5ML SOPN Inject 1 mg into the skin once a week. 6 pen 3   TRUEPLUS LANCETS 33G MISC Use as instructed two times daily 100 each 12   vitamin B-12 (CYANOCOBALAMIN) 100 MCG tablet Take 100 mcg by mouth daily.     No current facility-administered medications on file prior to visit.   Allergies  Allergen Reactions   Ace Inhibitors     REACTION: Coughing   Family History  Problem Relation Age of Onset   Hyperlipidemia Mother    Hypertension Mother    Arthritis Mother    Cancer Mother    Hyperlipidemia Father    Hypertension Father    Heart attack Father    Hyperlipidemia Brother    Diabetes Maternal Grandmother    Diabetes Maternal Grandfather    Colon cancer Neg Hx    Stomach cancer Neg Hx    Esophageal cancer Neg Hx    Rectal cancer Neg Hx     PE: BP 130/82    Pulse 69    Ht '5\' 10"'  (1.778 m)    Wt 218 lb 3.2 oz (99 kg)    SpO2 96%    BMI 31.31 kg/m  Wt Readings from Last 3 Encounters:  08/25/20 218 lb 3.2 oz (99 kg)  08/11/20 220 lb 9.6 oz (100.1 kg)  04/22/20 214 lb (97.1 kg)   Constitutional: overweight, in NAD Eyes: PERRLA, EOMI, no exophthalmos ENT: moist mucous  membranes, no thyromegaly but R inf thyroid nodule palpated, measuring approximately 2.5 cm, no cervical lymphadenopathy Cardiovascular: RRR, No MRG Respiratory: CTA B Gastrointestinal:  abdomen soft, NT, ND, BS+ Musculoskeletal: no deformities, strength intact in all 4 Skin: moist, warm, no rashes Neurological: no tremor with outstretched hands, DTR normal in all 4  ASSESSMENT: 1. DM2, non-insulin-dependent, uncontrolled, without long term complications, but with hyperglycemia - h/o DKA  2.  Multinodular goiter  3. HL  4.  Numbness in toes  PLAN:  1. Patient with longstanding, uncontrolled, type 2 diabetes, on oral antidiabetic regimen with metformin, sulfonylurea, SGLT2 inhibitor, and also long-acting insulin and weekly GLP-1 receptor agonist.  HbA1c at last visit was 6.5%, decreased, however, per review of his meter downloads, sugars are still high in the morning, and even when he did not have a snack at night, in the 140s to 170s.  He did have a snack at night, they were 180s to 200s.  We started long-acting insulin and I advised him to titrate the dose for a target a.m. CBG of less than 130.  We also discussed about the importance of stopping snacks at night.  I advised him to check some more blood sugars after dinner. -He had another HbA1c checked at the beginning of this month and this was higher, at 6.9% -At today's visit, sugars have improved compared to last visit, but most of the CBGs are still above target in the morning and also occasionally later in the day (he checks at bedtime).  He attributes this to snacks at night and we discussed about the importance of stopping this.  He varies the dose of Lantus anywhere from 18 to 24 units and we discussed about staying within the narrower interval, but mostly continue 20 units daily.  He forgets glipizide before dinner and we discussed about the importance of taking it 30 minutes before the meal. - I suggested to:  Patient Instructions   Please continue:  - Metformin 2000 mg with dinner - Glipizide 10 mg 30 min before dinner - Jardiance 25 mg before b'fast - Ozempic 1 mg weekly  Please keep:  - Lantus at 20 units at bedtime  Please return in 3-4 months with your sugar log   - advised to check sugars at different times of the day - 1x a day, rotating check times - advised for yearly eye exams >> he is UTD - return to clinic in 3-4 months  2. Thyroid nodules and goiter -He has a history of 2 thyroid nodules that have been evaluated by FNA 2014, with benign results -Reviewed ultrasound report from 2018: Stable nodules.  However, we repeated another ultrasound in 01/2020 and this showed an increase in the right inferior dominant nodule size from 2.8 to 3.7 cm.  An FNA was benign. -TSH was normal at last check: Lab Results  Component Value Date   TSH 0.46 01/06/2020  -He denies neck compression symptoms -We will check an HbA1c at next  3. HL -Reviewed latest lipid panel from 08/2020: Fractions at goal with exception of a slightly low HDL Lab Results  Component Value Date   CHOL 114 08/11/2020   HDL 38.20 (L) 08/11/2020   LDLCALC 49 08/11/2020   LDLDIRECT 72.0 05/07/2018   TRIG 137.0 08/11/2020   CHOLHDL 3 08/11/2020  -He continues on Lipitor 20, co-Q10, Krill oil, without side effects  4.  Elevated B12 -We checked a B12 level in the past as he complained of numbness in hands.  This was elevated.  I advised him to decrease the B12 supplement from 5000 mcg to 2500 mcg daily. -We will recheck the B12 level  at next visit   Philemon Kingdom, MD PhD [facility]

## 2020-08-25 ENCOUNTER — Ambulatory Visit: Payer: Medicare HMO | Admitting: Internal Medicine

## 2020-08-25 ENCOUNTER — Other Ambulatory Visit: Payer: Self-pay

## 2020-08-25 ENCOUNTER — Encounter: Payer: Self-pay | Admitting: Internal Medicine

## 2020-08-25 VITALS — BP 130/82 | HR 69 | Ht 70.0 in | Wt 218.2 lb

## 2020-08-25 DIAGNOSIS — E042 Nontoxic multinodular goiter: Secondary | ICD-10-CM

## 2020-08-25 DIAGNOSIS — R200 Anesthesia of skin: Secondary | ICD-10-CM | POA: Diagnosis not present

## 2020-08-25 DIAGNOSIS — E1165 Type 2 diabetes mellitus with hyperglycemia: Secondary | ICD-10-CM

## 2020-08-25 DIAGNOSIS — E782 Mixed hyperlipidemia: Secondary | ICD-10-CM | POA: Diagnosis not present

## 2020-08-25 MED ORDER — "INSULIN SYRINGE-NEEDLE U-100 30G X 5/16"" 0.3 ML MISC": 3 refills | Status: DC

## 2020-08-25 MED ORDER — INSULIN PEN NEEDLE 32G X 4 MM MISC: 3 refills | Status: DC

## 2020-08-25 MED ORDER — TRUE METRIX BLOOD GLUCOSE TEST VI STRP: ORAL_STRIP | 3 refills | Status: DC

## 2020-08-25 MED ORDER — TRUE METRIX BLOOD GLUCOSE TEST VI STRP: ORAL_STRIP | 12 refills | Status: DC

## 2020-08-25 MED ORDER — LANTUS SOLOSTAR 100 UNIT/ML ~~LOC~~ SOPN: 20.0000 [IU] | PEN_INJECTOR | Freq: Every day | SUBCUTANEOUS | 3 refills | Status: DC

## 2020-08-25 MED ORDER — TRUE METRIX LEVEL 1 LOW VI SOLN: 0 refills | Status: AC

## 2020-08-25 NOTE — Patient Instructions (Signed)
Please continue:  - Metformin 2000 mg with dinner - Glipizide 10 mg 30 min before dinner - Jardiance 25 mg before b'fast - Ozempic 1 mg weekly  Please keep:  - Lantus at 20 units at bedtime  Please return in 3-4 months with your sugar log

## 2020-09-01 ENCOUNTER — Other Ambulatory Visit: Payer: Self-pay | Admitting: Internal Medicine

## 2020-09-07 DIAGNOSIS — J9610 Chronic respiratory failure, unspecified whether with hypoxia or hypercapnia: Secondary | ICD-10-CM | POA: Diagnosis not present

## 2020-09-16 ENCOUNTER — Other Ambulatory Visit: Payer: Self-pay

## 2020-09-16 DIAGNOSIS — E785 Hyperlipidemia, unspecified: Secondary | ICD-10-CM

## 2020-09-16 MED ORDER — ATORVASTATIN CALCIUM 20 MG PO TABS: 20.0000 mg | ORAL_TABLET | Freq: Every day | ORAL | 3 refills | Status: DC

## 2020-10-05 DIAGNOSIS — J9610 Chronic respiratory failure, unspecified whether with hypoxia or hypercapnia: Secondary | ICD-10-CM | POA: Diagnosis not present

## 2020-10-06 DIAGNOSIS — R52 Pain, unspecified: Secondary | ICD-10-CM | POA: Diagnosis not present

## 2020-10-06 DIAGNOSIS — M180 Bilateral primary osteoarthritis of first carpometacarpal joints: Secondary | ICD-10-CM | POA: Diagnosis not present

## 2020-10-12 DIAGNOSIS — H5213 Myopia, bilateral: Secondary | ICD-10-CM | POA: Diagnosis not present

## 2020-10-12 LAB — HM DIABETES EYE EXAM

## 2020-10-19 ENCOUNTER — Other Ambulatory Visit: Payer: Self-pay | Admitting: Family Medicine

## 2020-10-19 DIAGNOSIS — I10 Essential (primary) hypertension: Secondary | ICD-10-CM

## 2020-11-05 DIAGNOSIS — J9610 Chronic respiratory failure, unspecified whether with hypoxia or hypercapnia: Secondary | ICD-10-CM | POA: Diagnosis not present

## 2020-11-08 ENCOUNTER — Telehealth: Payer: Self-pay | Admitting: Internal Medicine

## 2020-11-08 DIAGNOSIS — E1165 Type 2 diabetes mellitus with hyperglycemia: Secondary | ICD-10-CM

## 2020-11-08 MED ORDER — TRUEPLUS LANCETS 33G MISC: 2 refills | Status: AC

## 2020-11-08 MED ORDER — TRUE METRIX BLOOD GLUCOSE TEST VI STRP: ORAL_STRIP | 3 refills | Status: DC

## 2020-11-08 NOTE — Telephone Encounter (Signed)
MEDICATION: True Metrix Meter and True Plus Lancets 33G  PHARMACY:  [facility]  HAS THE PATIENT CONTACTED THEIR PHARMACY?  YES  IS THIS A 90 DAY SUPPLY : YES  IS PATIENT OUT OF MEDICATION:   IF NOT; HOW MUCH IS LEFT:   LAST APPOINTMENT DATE: @3 /04/2021  NEXT APPOINTMENT DATE:@4 /19/2022  DO WE HAVE YOUR PERMISSION TO LEAVE A DETAILED MESSAGE?: YES  OTHER COMMENTS:    **Let patient know to contact pharmacy at the end of the day to make sure medication is ready. **  ** Please notify patient to allow 48-72 hours to process**  **Encourage patient to contact the pharmacy for refills or they can request refills through [facility]**

## 2020-11-08 NOTE — Telephone Encounter (Signed)
Inbound fax from [facility] requesting a refill.

## 2020-11-12 DIAGNOSIS — M25542 Pain in joints of left hand: Secondary | ICD-10-CM | POA: Diagnosis not present

## 2020-11-12 DIAGNOSIS — M180 Bilateral primary osteoarthritis of first carpometacarpal joints: Secondary | ICD-10-CM | POA: Diagnosis not present

## 2020-11-23 ENCOUNTER — Ambulatory Visit: Payer: Medicare HMO | Admitting: Internal Medicine

## 2020-11-23 ENCOUNTER — Encounter: Payer: Self-pay | Admitting: Internal Medicine

## 2020-11-23 ENCOUNTER — Other Ambulatory Visit: Payer: Self-pay

## 2020-11-23 VITALS — BP 128/80 | HR 60 | Ht 70.0 in | Wt 227.0 lb

## 2020-11-23 DIAGNOSIS — E042 Nontoxic multinodular goiter: Secondary | ICD-10-CM | POA: Diagnosis not present

## 2020-11-23 DIAGNOSIS — R200 Anesthesia of skin: Secondary | ICD-10-CM | POA: Diagnosis not present

## 2020-11-23 DIAGNOSIS — E782 Mixed hyperlipidemia: Secondary | ICD-10-CM

## 2020-11-23 DIAGNOSIS — E1165 Type 2 diabetes mellitus with hyperglycemia: Secondary | ICD-10-CM

## 2020-11-23 LAB — TSH: TSH: 0.82 u[IU]/mL (ref 0.35–4.50)

## 2020-11-23 LAB — VITAMIN B12: Vitamin B-12: 599 pg/mL (ref 211–911)

## 2020-11-23 LAB — POCT GLYCOSYLATED HEMOGLOBIN (HGB A1C): Hemoglobin A1C: 7.2 % — AB (ref 4.0–5.6)

## 2020-11-23 NOTE — Patient Instructions (Addendum)
Please continue:  - Metformin 2000 mg with dinner - Glipizide 10 mg 30 min before dinner - Jardiance 25 mg before b'fast - Ozempic 1 mg weekly  Try to increase: - Lantus 24 units at bedtime  Try to stop eating after dinner.  Please return in 3-4 months with your sugar log

## 2020-11-23 NOTE — Progress Notes (Signed)
Patient ID: Robert Cordova, male   DOB: Sep 29, 1950, 70 y.o.   MRN: [id number]   This visit occurred during the SARS-CoV-2 public health emergency.  Safety protocols were in place, including screening questions prior to the visit, additional usage of staff PPE, and extensive cleaning of exam room while observing appropriate contact time as indicated for disinfecting solutions.   HPI: Robert Cordova is a 70 y.o.-year-old male, returning for f/u for DM2, dx in 2013, insulin-dependent since 04/2020, uncontrolled, with complications (h/o DKA). Last visit 3.5 months ago.  Interim history: He continues to snack at night and sugars remain higher at bedtime and in the morning. He is otherwise doing well, without new complaints.  Reviewed HbA1c levels: Lab Results  Component Value Date   HGBA1C 6.9 (H) 08/11/2020   HGBA1C 6.5 (A) 04/22/2020   HGBA1C 7.2 (A) 01/06/2020  08/22/2016: 7.7%  He is on: - Metformin 1000 mg 2x a day, with meals >> 2000 mg with dinner - Glipizide 10 mg after dinner >> 10 mg 30 minutes before dinner - Jardiance 25 mg before b'fast - Ozempic 0.5 mg weekly-added 09/2018 >> 1 mg weekly- increased 09/2019 - Lantus 12 units at bedtime-started 04/2020 >> 18-24 units >> 20 units at bedtime Of note, He was off and on insulin in the past. He came off when he lost weight after his lab band surgery in 2014 (45 pounds)  He is checking sugars 1-2 times a day - am: 125, 136-179, 195 (cookies), 200, 208, 221 >> 96-191 >> 136-201 - 2h after b'fast: 87-125, 147 >> n/c >> 181, 215 >> n/c - before lunch: 137, 141-200 >> 99-122 >> 148 >> n/c - 2h after lunch: 100, 153 >> n/c >> 118-215 >> n/c >> 121 >> n/c - before dinner:  119 >> 108-134 >> 94, 138, 150 >> n/c - 2h after dinner:328 (cheese cake) >> n/c >> 208 >> n/c - bedtime:  109, 132, 151, 269 (cookies) >> 102-188, 199 >> 178-232 - nighttime: n/c >> 212 >> n/c  Lowest sugar was 114 >> 94 >> 96; he has hypoglycemia  awareness in the 70s. Highest sugar was 221 >> 221 >> 199.  Pt's meals are: - Breakfast: Protein shake - Lunch: Eggs, bacon or leftovers - Dinner: Meat + veggies + starch, pizza, [place] food - Snacks: In the evening: cookies, etc.  -No CKD, last BUN/creatinine:  Lab Results  Component Value Date   BUN 17 08/11/2020   BUN 14 07/23/2019   CREATININE 0.75 08/11/2020   CREATININE 0.65 07/23/2019  On losartan 25 mg twice a day. -+ HL; last set of lipids: Lab Results  Component Value Date   CHOL 114 08/11/2020   HDL 38.20 (L) 08/11/2020   LDLCALC 49 08/11/2020   LDLDIRECT 72.0 05/07/2018   TRIG 137.0 08/11/2020   CHOLHDL 3 08/11/2020  On Lipitor 20, co-Q10, Krill oil - last eye exam was on 10/12/2020: No DR, + incipient cataract. [facility].  She sees Dr. Nicki Reaper and Dr. Katherene Ponto. -+ Numbness in the toes  B12 was elevated at last check-we increased his supplement dose from 5000 to 2500 mcg daily. Now on a different dose - will let me know.  Reviewed his B12 level Lab Results  Component Value Date   VITAMINB12 >1526 (H) 01/06/2020   Thyroid nodules: - s/p 2 benign biopsies in 2014  Pt denies: - feeling nodules in neck - hoarseness - dysphagia - choking - SOB with lying down  Reviewed thyroid imaging results  and biopsy results: Thyroid U/S (10/20/2016): Stable 2.8 cm solid nodule in the inferior right lobe.  This was previously sampled with negative biopsy.  The 1.8 cm nodule previously sampled in the right isthmus was no longer identified by ultrasound.  Thyroid U/S (01/15/2020): COMPARISON:  10/19/2016  FINDINGS: Parenchymal Echotexture: Mildly heterogenous Isthmus: 0.9 cm Right lobe: 6.1 x 2.6 x 1.9 cm. Left lobe: 5.4 x 2.3 x 1.3 cm _________________________________________________________  Nodule # 2: Prior biopsy: Yes Location: Right; Inferior Maximum size: 3.7 cm; Other 2 dimensions: 3.1 x 2.8 cm, previously, 2.8 x 2.6 x 2.1 cm Composition:  solid/almost completely solid (2) Echogenicity: isoechoic (1) Significant change in size (>/= 20% in two dimensions and minimal increase of 2 mm): Yes  **Given size (>/= 2.5 cm) and appearance, fine needle aspiration of this mildly suspicious nodule should be considered based on TI-RADS criteria. _______________________________________________________  There is a stable small hypoechoic nodule measuring approximately 7 mm in the right mid thyroid gland.  IMPRESSION: Interval increase in size of the dominant right-sided thyroid nodule, currently measuring 3.7 cm (previously measuring 2.8 cm). While this thyroid nodule was previously biopsied, consider repeat ultrasound or fine-needle aspiration given the nodule's significant interval growth.  Thyroid FNA (01/20/2020): FINAL MICROSCOPIC DIAGNOSIS:  - Consistent with benign follicular nodule (Bethesda category II)   SPECIMEN ADEQUACY:  Satisfactory for evaluation   Latest TSH was normal: Lab Results  Component Value Date   TSH 0.46 01/06/2020   He also has sleep-related hypoxia, HTN.  He has Medicare with 2 supplemental insurances.  ROS: Constitutional: no weight gain/no weight loss, no fatigue, no subjective hyperthermia, no subjective hypothermia Eyes: no blurry vision, no xerophthalmia ENT: no sore throat, + see HPI Cardiovascular: no CP/no SOB/no palpitations/no leg swelling Respiratory: no cough/no SOB/no wheezing Gastrointestinal: no N/no V/no D/no C/no acid reflux Musculoskeletal: no muscle aches/no joint aches Skin: no rashes, no hair loss Neurological: + numbness/no tingling/no dizziness  I reviewed pt's medications, allergies, PMH, social hx, family hx, and changes were documented in the history of present illness. Otherwise, unchanged from my initial visit note.   Past Medical History:  Diagnosis Date  . ABNORMAL GLUCOSE NEC 03/18/2007  . Arthritis    fingers  . BLEPHARITIS, LEFT 10/29/2009  .  DIABETES-TYPE 2 04/18/2010  . Dry throat   . Environmental allergies   . GERD (gastroesophageal reflux disease)   . H/O hiatal hernia   . HYPERLIPIDEMIA 10/22/2007  . HYPERTENSION 03/18/2007  . Multiple thyroid nodules    "biopsy done, everything was fine" being monitored  . NEVUS, MELANOCYTIC, FACE 10/07/2009   Past Surgical History:  Procedure Laterality Date  . BIOPSY THYROID  ~08/2013  . BREATH TEK H PYLORI N/A 09/05/2013   Procedure: BREATH TEK H PYLORI;  Surgeon: [name], MD;  Location: [facility];  Service: General;  Laterality: N/A;  . CATARACT EXTRACTION Bilateral   . COLONOSCOPY  2009, 2013   Dr. Sharlett Iles; multiple polyps  . HIATAL HERNIA REPAIR  11/25/2013   Procedure: LAPAROSCOPIC REPAIR OF HIATAL HERNIA;  Surgeon: [name], MD;  Location: [facility];  Service: General;;  . LAPAROSCOPIC GASTRIC BANDING N/A 11/25/2013   Procedure: LAPAROSCOPIC GASTRIC BANDING;  Surgeon: [name], MD;  Location: [facility];  Service: General;  Laterality: N/A;  . NO PAST SURGERIES     Social History   Social History  . Marital status: Married    Spouse name: N/A  . Number of children: 2   Occupational  History  . 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 Smoking status: Never Smoker  . Smokeless tobacco: Never Used  . Alcohol use No  . Drug use: No   Current Outpatient Medications on File Prior to Visit  Medication Sig Dispense Refill  . aspirin 81 MG tablet Take 1 tablet (81 mg total) by mouth daily. 30 tablet 5  . atorvastatin (LIPITOR) 20 MG tablet Take 1 tablet (20 mg total) by mouth daily. 90 tablet 3  . Blood Glucose Calibration (TRUE METRIX LEVEL 1) Low SOLN Use as instructed 1 each 0  . Blood Glucose Monitoring Suppl (TRUE METRIX AIR GLUCOSE METER) w/Device KIT 1 Device by Other route as directed. 1 kit 0  . Cholecalciferol (VITAMIN D PO) Take 50 mcg by mouth daily.    . Coenzyme Q10 (CO Q-10) 200 MG CAPS Take by mouth daily.     [name] glipiZIDE (GLUCOTROL) 5 MG tablet TAKE 1 TABLET TWICE DAILY BEFORE MEALS 180 tablet 3  . glucose blood (TRUE METRIX BLOOD GLUCOSE TEST) test strip Use to check blood sugar 2x a day. 200 each 3  . insulin glargine (LANTUS SOLOSTAR) 100 UNIT/ML Solostar Pen Inject 20 Units into the skin at bedtime. 30 mL 3  . Insulin Pen Needle 32G X 4 MM MISC Use 1x a day 100 each 3  . JARDIANCE 25 MG TABS tablet TAKE 1 TABLET EVERY DAY 90 tablet 1  . Lidocaine 1.8 % PTCH Apply topically.    [name] losartan (COZAAR) 50 MG tablet TAKE 1 TABLET EVERY DAY 90 tablet 0  . Magnesium 250 MG TABS Take by mouth daily.     . metFORMIN (GLUCOPHAGE) 500 MG tablet TAKE 2 TABLETS TWICE DAILY WITH MEALS 360 tablet 3  . minoxidil (ROGAINE) 2 % external solution Apply 1 application topically daily. 60 mL 10  . Multiple Vitamins-Minerals (MULTIVITAMIN WITH MINERALS) tablet Take 1 tablet by mouth daily.    Donell Sievert IN Inhale into the lungs.     [name] OZEMPIC, 1 MG/DOSE, 4 MG/3ML SOPN INJECT 1 MG INTO THE SKIN ONCE A WEEK. 9 mL 1  . Semaglutide, 1 MG/DOSE, (OZEMPIC, 1 MG/DOSE,) 2 MG/1.5ML SOPN Inject 1 mg into the skin once a week. 6 pen 3  . TRUEplus Lancets 33G MISC Use as instructed two times daily 300 each 2  . vitamin B-12 (CYANOCOBALAMIN) 100 MCG tablet Take 100 mcg by mouth daily.     No current facility-administered medications on file prior to visit.   Allergies  Allergen Reactions  . Ace Inhibitors     REACTION: Coughing   Family History  Problem Relation Age of Onset  . Hyperlipidemia Mother   . Hypertension Mother   . Arthritis Mother   . Cancer Mother   . Hyperlipidemia Father   . Hypertension Father   . Heart attack Father   . Hyperlipidemia Brother   . Diabetes Maternal Grandmother   . Diabetes Maternal Grandfather   . Colon cancer Neg Hx   . Stomach cancer Neg Hx   . Esophageal cancer Neg Hx   . Rectal cancer Neg Hx     PE: BP 128/80 (BP Location: Right Arm, Patient Position: Sitting, Cuff  Size: Normal)   Pulse 60   Ht _0  (1.778 m)   Wt 227 lb (103 kg)   SpO2 97%   BMI 32.57 kg/m  Wt Readings from Last 3 Encounters:  11/23/20 227 lb (103 kg)  08/25/20 218 lb  3.2 oz (99 kg)  08/11/20 220 lb 9.6 oz (100.1 kg)   Constitutional: overweight, in NAD Eyes: PERRLA, EOMI, no exophthalmos ENT: moist mucous membranes, no thyromegaly but R inf thyroid nodule palpated, measuring approximately 2.5 cm, no cervical lymphadenopathy Cardiovascular: RRR, No MRG Respiratory: CTA B Gastrointestinal: abdomen soft, NT, ND, BS+ Musculoskeletal: no deformities, strength intact in all 4 Skin: moist, warm, no rashes Neurological: no tremor with outstretched hands, DTR normal in all 4  ASSESSMENT: 1. DM2, non-insulin-dependent, uncontrolled, without long term complications, but with hyperglycemia - h/o DKA  2.  Multinodular goiter  3. HL  4.  Numbness in toes  PLAN:  1. Patient with longstanding, uncontrolled, type 2 diabetes, on oral antidiabetic regimen with metformin, sulfonylurea, SGLT2 inhibitor and also long-acting insulin and weekly GLP-1 receptor agonist.  At last visit, HbA1c was higher, at 6.9%. Sugars appear to be better, but most of the CBGs were still above target in the morning and occasionally later in the day.  We discussed about the importance of stopping snacks at night and I also advised him to keep a stable dose of Lantus, since he was very engaged between 18 and 24 units.  She was forgetting glipizide before dinner and we discussed about importance of taking it 30 minutes before this meal. -At today's visit, sugars are higher than before, especially at bedtime and in the morning.  He tells me that he continues to snack after dinner.  States sugars remain elevated throughout the night and then in the morning, I again advised him to try to stop snacking, however, if he is not able to do so, to increase the Lantus dose.  He tolerates well the rest of the regimen and we will  continue these. - I suggested to:  Patient Instructions  Please continue:  - Metformin 2000 mg with dinner - Glipizide 10 mg 30 min before dinner - Jardiance 25 mg before b'fast - Ozempic 1 mg weekly  Try to increase: - Lantus 24 units at bedtime  Try to stop eating after dinner.  Please return in 3-4 months with your sugar log   - we checked his HbA1c: 7.2% (higher) - advised to check sugars at different times of the day - 1-2x a day, rotating check times - advised for yearly eye exams >> he is UTD - return to clinic in 3-4 months  2. Thyroid nodules and goiter -He has a history of 2 thyroid nodules that have been evaluated but may results -Nodules appear stable to on the thyroid ultrasound from 2018.  However, we repeated another thyroid ultrasound in 01/2020 and this showed an increase in the right inferior dominant nodule size from 2.8 to 3.7 cm.  An FNA was benign. -TSH was normal at last check: Lab Results  Component Value Date   TSH 0.46 01/06/2020  -No neck compression symptoms -We will recheck a TSH now  3. HL -Reviewed latest lipid panel from 08/2020: Fractions at goal with the exception of a slightly low HDL: Lab Results  Component Value Date   CHOL 114 08/11/2020   HDL 38.20 (L) 08/11/2020   LDLCALC 49 08/11/2020   LDLDIRECT 72.0 05/07/2018   TRIG 137.0 08/11/2020   CHOLHDL 3 08/11/2020  -He continues on Lipitor 20 mg daily, co-Q10, Krill oil, without side effects  4.  Elevated B12 -We checked a B12 level in the past due to numbness in hands.  This was elevated.  I advised him to decrease the B12 supplement from  5000 mcg to 2500 mcg daily.  At this visit, he tells me that he was taking a multivitamin and also a B12 supplement and he now eliminated the multivitamin.  He is not sure exactly how much B12 he takes daily but he will look at home and let me know -We will recheck his B12 level now  Addendum: Patient sent me the record of his Super B Complex he is  taking-this contains 15 mcg of B12, only...  Component     Latest Ref Rng & Units 11/23/2020  TSH     0.35 - 4.50 uIU/mL 0.82  Vitamin B12     211 - 911 pg/mL 599  TSH is normal.  B12 is also normal.  However, I anticipated the next B12 level would be quite low if he continues on the above low dose of B12, so I would suggest to add 1000 mcg every other day.  Philemon Kingdom, MD PhD [facility]

## 2020-11-26 DIAGNOSIS — M180 Bilateral primary osteoarthritis of first carpometacarpal joints: Secondary | ICD-10-CM | POA: Diagnosis not present

## 2020-12-05 DIAGNOSIS — J9610 Chronic respiratory failure, unspecified whether with hypoxia or hypercapnia: Secondary | ICD-10-CM | POA: Diagnosis not present

## 2021-01-02 ENCOUNTER — Other Ambulatory Visit: Payer: Self-pay | Admitting: Family Medicine

## 2021-01-02 DIAGNOSIS — I10 Essential (primary) hypertension: Secondary | ICD-10-CM

## 2021-01-05 DIAGNOSIS — J9610 Chronic respiratory failure, unspecified whether with hypoxia or hypercapnia: Secondary | ICD-10-CM | POA: Diagnosis not present

## 2021-01-13 ENCOUNTER — Other Ambulatory Visit: Payer: Self-pay | Admitting: Internal Medicine

## 2021-01-13 DIAGNOSIS — E1165 Type 2 diabetes mellitus with hyperglycemia: Secondary | ICD-10-CM

## 2021-02-04 DIAGNOSIS — J9610 Chronic respiratory failure, unspecified whether with hypoxia or hypercapnia: Secondary | ICD-10-CM | POA: Diagnosis not present

## 2021-02-14 DIAGNOSIS — Z0100 Encounter for examination of eyes and vision without abnormal findings: Secondary | ICD-10-CM | POA: Diagnosis not present

## 2021-02-23 ENCOUNTER — Ambulatory Visit (INDEPENDENT_AMBULATORY_CARE_PROVIDER_SITE_OTHER): Payer: Medicare HMO

## 2021-02-23 ENCOUNTER — Other Ambulatory Visit: Payer: Self-pay

## 2021-02-23 ENCOUNTER — Telehealth: Payer: Self-pay | Admitting: *Deleted

## 2021-02-23 DIAGNOSIS — Z0000 Encounter for general adult medical examination without abnormal findings: Secondary | ICD-10-CM | POA: Diagnosis not present

## 2021-02-23 DIAGNOSIS — Z1211 Encounter for screening for malignant neoplasm of colon: Secondary | ICD-10-CM | POA: Diagnosis not present

## 2021-02-23 DIAGNOSIS — F439 Reaction to severe stress, unspecified: Secondary | ICD-10-CM

## 2021-02-23 NOTE — Progress Notes (Signed)
Virtual Visit via Telephone Note  I connected with  Robert Cordova on 02/23/21 at  8:45 AM EDT by telephone and verified that I am speaking with the correct person using two identifiers.  Medicare Annual Wellness visit completed telephonically due to Covid-19 pandemic.   Persons participating in this call: This Health Coach and this patient.   Location: Patient: Home Provider: Office   I discussed the limitations, risks, security and privacy concerns of performing an evaluation and management service by telephone and the availability of in person appointments. The patient expressed understanding and agreed to proceed.  Unable to perform video visit due to video visit attempted and failed and/or patient does not have video capability.   Some vital signs may be absent or patient reported.   Willette Brace, LPN   Subjective:   Robert Cordova is a 70 y.o. male who presents for Medicare Annual/Subsequent preventive examination.  Review of Systems     Cardiac Risk Factors include: advanced age (>81mn, >>9women);hypertension;diabetes mellitus;dyslipidemia;male gender;obesity (BMI >30kg/m2)     Objective:    Today's Vitals   02/23/21 0850  PainSc: 3    There is no height or weight on file to calculate BMI.  Advanced Directives 02/23/2021 02/27/2020 04/29/2017 08/15/2016 11/25/2013 11/17/2013  Does Patient Have a Medical Advance Directive? No No No No Patient does not have advance directive Patient does not have advance directive;Patient would like information  Would patient like information on creating a medical advance directive? Yes (MAU/Ambulatory/Procedural Areas - Information given) No - Patient declined No - Patient declined - - Advance directive packet given  Pre-existing out of facility DNR order (yellow form or pink MOST form) - - - - No No    Current Medications (verified) Outpatient Encounter Medications as of 02/23/2021  Medication Sig   aspirin 81 MG tablet  Take 1 tablet (81 mg total) by mouth daily.   atorvastatin (LIPITOR) 20 MG tablet Take 1 tablet (20 mg total) by mouth daily.   Blood Glucose Calibration (TRUE METRIX LEVEL 1) Low SOLN Use as instructed   Blood Glucose Monitoring Suppl (TRUE METRIX AIR GLUCOSE METER) w/Device KIT 1 Device by Other route as directed.   Cholecalciferol (VITAMIN D PO) Take 50 mcg by mouth daily.   Coenzyme Q10 (CO Q-10) 200 MG CAPS Take by mouth daily.   glipiZIDE (GLUCOTROL) 5 MG tablet TAKE 1 TABLET TWICE DAILY BEFORE MEALS   glucose blood (TRUE METRIX BLOOD GLUCOSE TEST) test strip Use to check blood sugar 2x a day.   insulin glargine (LANTUS SOLOSTAR) 100 UNIT/ML Solostar Pen Inject 20 Units into the skin at bedtime.   Insulin Pen Needle 32G X 4 MM MISC Use 1x a day   JARDIANCE 25 MG TABS tablet TAKE 1 TABLET EVERY DAY   Lidocaine 1.8 % PTCH Apply topically.   losartan (COZAAR) 50 MG tablet TAKE 1 TABLET EVERY DAY   Magnesium 250 MG TABS Take by mouth daily.    meloxicam (MOBIC) 7.5 MG tablet TAKE 1 TABLET BY MOUTH DAILY. TAKE WITH FOOD   metFORMIN (GLUCOPHAGE) 500 MG tablet TAKE 2 TABLETS TWICE DAILY WITH MEALS   minoxidil (ROGAINE) 2 % external solution Apply 1 application topically daily.   OXYGEN-HELIUM IN Inhale into the lungs.    OZEMPIC, 1 MG/DOSE, 4 MG/3ML SOPN INJECT 1 MG INTO THE SKIN ONCE A WEEK.   TRUEplus Lancets 33G MISC Use as instructed two times daily   vitamin B-12 (CYANOCOBALAMIN) 100 MCG tablet Take 100 mcg  by mouth daily.   Multiple Vitamins-Minerals (MULTIVITAMIN WITH MINERALS) tablet Take 1 tablet by mouth daily. (Patient not taking: Reported on 02/23/2021)   No facility-administered encounter medications on file as of 02/23/2021.    Allergies (verified) Ace inhibitors   History: Past Medical History:  Diagnosis Date   ABNORMAL GLUCOSE NEC 03/18/2007   Arthritis    fingers   BLEPHARITIS, LEFT 10/29/2009   DIABETES-TYPE 2 04/18/2010   Dry throat    Environmental allergies     GERD (gastroesophageal reflux disease)    H/O hiatal hernia    HYPERLIPIDEMIA 10/22/2007   HYPERTENSION 03/18/2007   Multiple thyroid nodules    "biopsy done, everything was fine" being monitored   NEVUS, MELANOCYTIC, FACE 10/07/2009   Past Surgical History:  Procedure Laterality Date   BIOPSY THYROID  ~08/2013   BREATH TEK H PYLORI N/A 09/05/2013   Procedure: BREATH TEK H PYLORI;  Surgeon: [name], MD;  Location: [facility];  Service: General;  Laterality: N/A;   CATARACT EXTRACTION Bilateral    COLONOSCOPY  2009, 2013   Dr. Sharlett Iles; multiple polyps   HIATAL HERNIA REPAIR  11/25/2013   Procedure: LAPAROSCOPIC REPAIR OF HIATAL HERNIA;  Surgeon: [name], MD;  Location: [facility];  Service: General;;   LAPAROSCOPIC GASTRIC BANDING N/A 11/25/2013   Procedure: LAPAROSCOPIC GASTRIC BANDING;  Surgeon: [name], MD;  Location: [facility];  Service: General;  Laterality: N/A;   NO PAST SURGERIES     Family History  Problem Relation Age of Onset   Hyperlipidemia Mother    Hypertension Mother    Arthritis Mother    Cancer Mother    Hyperlipidemia Father    Hypertension Father    Heart attack Father    Hyperlipidemia Brother    Diabetes Maternal Grandmother    Diabetes Maternal Grandfather    Colon cancer Neg Hx    Stomach cancer Neg Hx    Esophageal cancer Neg Hx    Rectal cancer Neg Hx    Social History   Socioeconomic History   Marital status: Married    Spouse name: Not on file   Number of children: Not on file   Years of education: Not on file   Highest education level: Not on file  Occupational History   Occupation: Optometrist-- self-employeed  Tobacco Use   Smoking status: Never   Smokeless tobacco: Never  Vaping Use   Vaping Use: Never used  Substance and Sexual Activity   Alcohol use: No   Drug use: No   Sexual activity: Not on file  Other Topics Concern   Not on file  Social History Narrative   Not on file   Social Determinants of Health    Financial Resource Strain: Low Risk    Difficulty of Paying Living Expenses: Not hard at all  Food Insecurity: No Food Insecurity   Worried About Charity fundraiser in the Last Year: Never true   Arboriculturist in the Last Year: Never true  Transportation Needs: No Transportation Needs   Lack of Transportation (Medical): No   Lack of Transportation (Non-Medical): No  Physical Activity: Inactive   Days of Exercise per Week: 0 days   Minutes of Exercise per Session: 0 min  Stress: Stress Concern Present   Feeling of Stress : Rather much  Social Connections: Moderately Isolated   Frequency of Communication with Friends and Family: More than three times a week   Frequency of Social Gatherings with  Friends and Family: More than three times a week   Attends Religious Services: Never   Marine scientist or Organizations: No   Attends Music therapist: Never   Marital Status: Married    Tobacco Counseling Counseling given: Not Answered   Clinical Intake:  Pre-visit preparation completed: Yes  Pain : 0-10 Pain Score: 3  Pain Type: Chronic pain Pain Location:  (thumbs) Pain Orientation: Left, Right Pain Descriptors / Indicators: Aching Pain Onset: 1 to 4 weeks ago Pain Frequency: Intermittent     BMI - recorded: 35.27 Nutritional Status: BMI > 30  Obese Nutritional Risks: None Diabetes: Yes CBG done?: Yes (145) CBG resulted in Enter/ Edit results?: No Did pt. bring in CBG monitor from home?: No  How often do you need to have someone help you when you read instructions, pamphlets, or other written materials from your doctor or pharmacy?: 1 - Never  Diabetic?Nutrition Risk Assessment:  Has the patient had any N/V/D within the last 2 months?  No  Does the patient have any non-healing wounds?  No  Has the patient had any unintentional weight loss or weight gain?  No   Diabetes:  Is the patient diabetic?  Yes  If diabetic, was a CBG obtained today?   Yes  Did the patient bring in their glucometer from home?  No  How often do you monitor your CBG's? As needed.   Financial Strains and Diabetes Management:  Are you having any financial strains with the device, your supplies or your medication? No .  Does the patient want to be seen by Chronic Care Management for management of their diabetes?  No  Would the patient like to be referred to a Nutritionist or for Diabetic Management?  No   Diabetic Exams:  Diabetic Eye Exam: Completed 10/12/20 Diabetic Foot Exam: Overdue, Pt has been advised about the importance in completing this exam. Pt is scheduled for diabetic foot exam on next appt.   Interpreter Needed?: No  Information entered by :: Charlott Rakes, LPN   Activities of Daily Living In your present state of health, do you have any difficulty performing the following activities: 02/23/2021 02/27/2020  Hearing? Y N  Vision? N N  Difficulty concentrating or making decisions? N N  Walking or climbing stairs? N Y  Comment - Has right hip pain, and lower back pain  Dressing or bathing? N N  Doing errands, shopping? N N  Preparing Food and eating ? N N  Using the Toilet? N N  In the past six months, have you accidently leaked urine? N Y  Comment - Has occasional urine leakage with urgency  Do you have problems with loss of bowel control? N N  Managing your Medications? N N  Managing your Finances? N N  Housekeeping or managing your Housekeeping? N N  Some recent data might be hidden    Patient Care Team: Billie Ruddy, MD as PCP - General (Family Medicine) Park Liter, MD as PCP - Cardiology (Cardiology)  Indicate any recent Medical Services you may have received from other than [facility] providers in the past year (date may be approximate).     Assessment:   This is a routine wellness examination for Robert Cordova.  Hearing/Vision screen Hearing Screening - Comments:: Pt stated mild loss  Vision Screening - Comments:: Pt  follows up with Dr Nicki Reaper for annual eye exams   Dietary issues and exercise activities discussed: Current Exercise Habits: The patient does not  participate in regular exercise at present (yard work)   Goals Addressed             This Visit's Progress    Patient Stated       None at this time       Depression Screen PHQ 2/9 Scores 02/23/2021 02/27/2020 05/15/2017 08/15/2016 10/04/2015 03/03/2015 08/11/2014  PHQ - 2 Score 1 0 0 0 0 0 0  PHQ- 9 Score - 0 0 - - - -    Fall Risk Fall Risk  02/23/2021 02/27/2020 05/15/2017 08/15/2016 10/04/2015  Falls in the past year? 0 0 No No No  Number falls in past yr: 0 0 - - -  Injury with Fall? 0 0 - - -  Risk for fall due to : Impaired vision Medication side effect;Orthopedic patient - - -  Follow up Falls prevention discussed Falls evaluation completed;Falls prevention discussed - - -    FALL RISK PREVENTION PERTAINING TO THE HOME:  Any stairs in or around the home? No  If so, are there any without handrails? No  Home free of loose throw rugs in walkways, pet beds, electrical cords, etc? Yes  Adequate lighting in your home to reduce risk of falls? Yes   ASSISTIVE DEVICES UTILIZED TO PREVENT FALLS:  Life alert? No  Use of a cane, walker or w/c? No  Grab bars in the bathroom? No  Shower chair or bench in shower? No  Elevated toilet seat or a handicapped toilet? No   TIMED UP AND GO:  Was the test performed? No     Cognitive Function:     6CIT Screen 02/23/2021 02/27/2020 08/15/2016  What Year? 0 points 0 points 0 points  What month? 0 points 0 points 0 points  What time? 0 points 0 points 0 points  Count back from 20 0 points 0 points 0 points  Months in reverse 0 points 0 points 0 points  Repeat phrase 0 points 2 points 0 points  Total Score 0 2 0    Immunizations Immunization History  Administered Date(s) Administered   Fluad Quad(high Dose 65+) 04/25/2019, 05/20/2020   Influenza Split 07/13/2011, 05/15/2012   Influenza Whole  05/09/2008, 05/10/2009, 04/18/2010   Influenza, High Dose Seasonal PF 05/30/2016, 04/30/2018   Influenza,inj,Quad PF,6+ Mos 04/22/2013, 05/07/2014, 05/07/2015, 05/15/2017   PFIZER(Purple Top)SARS-COV-2 Vaccination 08/20/2019, 09/09/2019, 05/08/2020   Pneumococcal Conjugate-13 05/07/2014   Pneumococcal Polysaccharide-23 06/22/2015   Td 08/07/2002, 08/07/2006   Tdap 05/07/2014   Zoster, Live 01/30/2011    TDAP status: Up to date  Flu Vaccine status: Up to date  Pneumococcal vaccine status: Up to date  Covid-19 vaccine status: Completed vaccines  Qualifies for Shingles Vaccine? Yes   Zostavax completed Yes   Shingrix Completed?: No.    Education has been provided regarding the importance of this vaccine. Patient has been advised to call insurance company to determine out of pocket expense if they have not yet received this vaccine. Advised may also receive vaccine at local pharmacy or Health Dept. Verbalized acceptance and understanding.  Screening Tests Health Maintenance  Topic Date Due   Zoster Vaccines- Shingrix (1 of 2) Never done   FOOT EXAM  11/15/2016   COLONOSCOPY (Pts 45-30yr Insurance coverage will need to be confirmed)  09/02/2017   COVID-19 Vaccine (4 - Booster for Pfizer series) 08/08/2020   INFLUENZA VACCINE  03/07/2021   HEMOGLOBIN A1C  05/25/2021   OPHTHALMOLOGY EXAM  10/12/2021   TETANUS/TDAP  05/07/2024   Hepatitis C  Screening  Completed   PNA vac Low Risk Adult  Completed   HPV VACCINES  Aged Out    Health Maintenance  Health Maintenance Due  Topic Date Due   Zoster Vaccines- Shingrix (1 of 2) Never done   FOOT EXAM  11/15/2016   COLONOSCOPY (Pts 45-79yr Insurance coverage will need to be confirmed)  09/02/2017   COVID-19 Vaccine (4 - Booster for PJacksonseries) 08/08/2020    Colorectal cancer screening: Referral to GI placed 02/23/21. Pt aware the office will call re: appt.   Additional Screening:  Hepatitis C Screening: Completed  02/15/16  Vision Screening: Recommended annual ophthalmology exams for early detection of glaucoma and other disorders of the eye. Is the patient up to date with their annual eye exam?  Yes  Who is the provider or what is the name of the office in which the patient attends annual eye exams? Dr SNicki ReaperIf pt is not established with a provider, would they like to be referred to a provider to establish care? No .   Dental Screening: Recommended annual dental exams for proper oral hygiene  Community Resource Referral / Chronic Care Management: CRR required this visit?  Yes   CCM required this visit?  No      Plan:     I have personally reviewed and noted the following in the patient's chart:   Medical and social history Use of alcohol, tobacco or illicit drugs  Current medications and supplements including opioid prescriptions. Patient is not currently taking opioid prescriptions. Functional ability and status Nutritional status Physical activity Advanced directives List of other physicians Hospitalizations, surgeries, and ER visits in previous 12 months Vitals Screenings to include cognitive, depression, and falls Referrals and appointments  In addition, I have reviewed and discussed with patient certain preventive protocols, quality metrics, and best practice recommendations. A written personalized care plan for preventive services as well as general preventive health recommendations were provided to patient.     TWillette Brace LPN   [id number]  Nurse Notes: pt stated that he has noticed more of an urgency to urinate and is up multiple times to the bathroom a night please advise

## 2021-02-23 NOTE — Chronic Care Management (AMB) (Signed)
  Chronic Care Management   Note  02/23/2021 Name: Robert Cordova MRN: [id number] DOB: [birth year]  Robert Cordova is a 70 y.o. year old male who is a primary care patient of Billie Ruddy, MD. I reached out to [employer] by phone today in response to a referral sent by Robert Cordova's PCP, Dr. Volanda Napoleon      Robert Cordova was given information about Chronic Care Management services today including:  CCM service includes personalized support from designated clinical staff supervised by his physician, including individualized plan of care and coordination with other care providers 24/7 contact phone numbers for assistance for urgent and routine care needs. Service will only be billed when office clinical staff spend 20 minutes or more in a month to coordinate care. Only one practitioner may furnish and bill the service in a calendar month. The patient may stop CCM services at any time (effective at the end of the month) by phone call to the office staff. The patient will be responsible for cost sharing (co-pay) of up to 20% of the service fee (after annual deductible is met).  Patient agreed to services and verbal consent obtained.   Follow up plan: Telephone appointment with care management team member scheduled for:03/03/21  [place] [facility]

## 2021-02-23 NOTE — Patient Instructions (Signed)
[name] , Thank you for taking time to come for your Medicare Wellness Visit. I appreciate your ongoing commitment to your health goals. Please review the following plan we discussed and let me know if I can assist you in the future.   Screening recommendations/referrals: Colonoscopy: Ordered 02/23/21 Recommended yearly ophthalmology/optometry visit for glaucoma screening and checkup Recommended yearly dental visit for hygiene and checkup  Vaccinations: Influenza vaccine: due 03/07/21 Pneumococcal vaccine: Completed  Tdap vaccine: Done 05/07/14 repeat in 10 years 05/07/24 Shingles vaccine: Shingrix discussed. Please contact your pharmacy for coverage information.    Covid-19: Completed 1/13, 2/2, & 05/08/20  Advanced directives: Advance directive discussed with you today. I have provided a copy for you to complete at home and have notarized. Once this is complete please bring a copy in to our office so we can scan it into your chart.  Conditions/risks identified: None at this time  Next appointment: Follow up in one year for your annual wellness visit.   Preventive Care 1 Years and Older, Male Preventive care refers to lifestyle choices and visits with your health care provider that can promote health and wellness. What does preventive care include? A yearly physical exam. This is also called an annual well check. Dental exams once or twice a year. Routine eye exams. Ask your health care provider how often you should have your eyes checked. Personal lifestyle choices, including: Daily care of your teeth and gums. Regular physical activity. Eating a healthy diet. Avoiding tobacco and drug use. Limiting alcohol use. Practicing safe sex. Taking low doses of aspirin every day. Taking vitamin and mineral supplements as recommended by your health care provider. What happens during an annual well check? The services and screenings done by your health care provider during your annual  well check will depend on your age, overall health, lifestyle risk factors, and family history of disease. Counseling  Your health care provider may ask you questions about your: Alcohol use. Tobacco use. Drug use. Emotional well-being. Home and relationship well-being. Sexual activity. Eating habits. History of falls. Memory and ability to understand (cognition). Work and work Statistician. Screening  You may have the following tests or measurements: Height, weight, and BMI. Blood pressure. Lipid and cholesterol levels. These may be checked every 5 years, or more frequently if you are over 34 years old. Skin check. Lung cancer screening. You may have this screening every year starting at age 15 if you have a 30-pack-year history of smoking and currently smoke or have quit within the past 15 years. Fecal occult blood test (FOBT) of the stool. You may have this test every year starting at age 73. Flexible sigmoidoscopy or colonoscopy. You may have a sigmoidoscopy every 5 years or a colonoscopy every 10 years starting at age 13. Prostate cancer screening. Recommendations will vary depending on your family history and other risks. Hepatitis C blood test. Hepatitis B blood test. Sexually transmitted disease (STD) testing. Diabetes screening. This is done by checking your blood sugar (glucose) after you have not eaten for a while (fasting). You may have this done every 1-3 years. Abdominal aortic aneurysm (AAA) screening. You may need this if you are a current or former smoker. Osteoporosis. You may be screened starting at age 23 if you are at high risk. Talk with your health care provider about your test results, treatment options, and if necessary, the need for more tests. Vaccines  Your health care provider may recommend certain vaccines, such as: Influenza vaccine. This is  recommended every year. Tetanus, diphtheria, and acellular pertussis (Tdap, Td) vaccine. You may need a Td booster  every 10 years. Zoster vaccine. You may need this after age 60. Pneumococcal 13-valent conjugate (PCV13) vaccine. One dose is recommended after age 18. Pneumococcal polysaccharide (PPSV23) vaccine. One dose is recommended after age 47. Talk to your health care provider about which screenings and vaccines you need and how often you need them. This information is not intended to replace advice given to you by your health care provider. Make sure you discuss any questions you have with your health care provider. Document Released: 08/20/2015 Document Revised: 04/12/2016 Document Reviewed: 05/25/2015 Elsevier Interactive Patient Education  2017 [place] Prevention in the Home Falls can cause injuries. They can happen to people of all ages. There are many things you can do to make your home safe and to help prevent falls. What can I do on the outside of my home? Regularly fix the edges of walkways and driveways and fix any cracks. Remove anything that might make you trip as you walk through a door, such as a raised step or threshold. Trim any bushes or trees on the path to your home. Use bright outdoor lighting. Clear any walking paths of anything that might make someone trip, such as rocks or tools. Regularly check to see if handrails are loose or broken. Make sure that both sides of any steps have handrails. Any raised decks and porches should have guardrails on the edges. Have any leaves, snow, or ice cleared regularly. Use sand or salt on walking paths during winter. Clean up any spills in your garage right away. This includes oil or grease spills. What can I do in the bathroom? Use night lights. Install grab bars by the toilet and in the tub and shower. Do not use towel bars as grab bars. Use non-skid mats or decals in the tub or shower. If you need to sit down in the shower, use a plastic, non-slip stool. Keep the floor dry. Clean up any water that spills on the floor as soon  as it happens. Remove soap buildup in the tub or shower regularly. Attach bath mats securely with double-sided non-slip rug tape. Do not have throw rugs and other things on the floor that can make you trip. What can I do in the bedroom? Use night lights. Make sure that you have a light by your bed that is easy to reach. Do not use any sheets or blankets that are too big for your bed. They should not hang down onto the floor. Have a firm chair that has side arms. You can use this for support while you get dressed. Do not have throw rugs and other things on the floor that can make you trip. What can I do in the kitchen? Clean up any spills right away. Avoid walking on wet floors. Keep items that you use a lot in easy-to-reach places. If you need to reach something above you, use a strong step stool that has a grab bar. Keep electrical cords out of the way. Do not use floor polish or wax that makes floors slippery. If you must use wax, use non-skid floor wax. Do not have throw rugs and other things on the floor that can make you trip. What can I do with my stairs? Do not leave any items on the stairs. Make sure that there are handrails on both sides of the stairs and use them. Fix handrails that are  broken or loose. Make sure that handrails are as long as the stairways. Check any carpeting to make sure that it is firmly attached to the stairs. Fix any carpet that is loose or worn. Avoid having throw rugs at the top or bottom of the stairs. If you do have throw rugs, attach them to the floor with carpet tape. Make sure that you have a light switch at the top of the stairs and the bottom of the stairs. If you do not have them, ask someone to add them for you. What else can I do to help prevent falls? Wear shoes that: Do not have high heels. Have rubber bottoms. Are comfortable and fit you well. Are closed at the toe. Do not wear sandals. If you use a stepladder: Make sure that it is fully  opened. Do not climb a closed stepladder. Make sure that both sides of the stepladder are locked into place. Ask someone to hold it for you, if possible. Clearly mark and make sure that you can see: Any grab bars or handrails. First and last steps. Where the edge of each step is. Use tools that help you move around (mobility aids) if they are needed. These include: Canes. Walkers. Scooters. Crutches. Turn on the lights when you go into a dark area. Replace any light bulbs as soon as they burn out. Set up your furniture so you have a clear path. Avoid moving your furniture around. If any of your floors are uneven, fix them. If there are any pets around you, be aware of where they are. Review your medicines with your doctor. Some medicines can make you feel dizzy. This can increase your chance of falling. Ask your doctor what other things that you can do to help prevent falls. This information is not intended to replace advice given to you by your health care provider. Make sure you discuss any questions you have with your health care provider. Document Released: 05/20/2009 Document Revised: 12/30/2015 Document Reviewed: 08/28/2014 Elsevier Interactive Patient Education  2017 [employer].

## 2021-03-01 DIAGNOSIS — D1801 Hemangioma of skin and subcutaneous tissue: Secondary | ICD-10-CM | POA: Diagnosis not present

## 2021-03-01 DIAGNOSIS — L821 Other seborrheic keratosis: Secondary | ICD-10-CM | POA: Diagnosis not present

## 2021-03-01 DIAGNOSIS — B351 Tinea unguium: Secondary | ICD-10-CM | POA: Diagnosis not present

## 2021-03-01 DIAGNOSIS — L84 Corns and callosities: Secondary | ICD-10-CM | POA: Diagnosis not present

## 2021-03-01 DIAGNOSIS — L814 Other melanin hyperpigmentation: Secondary | ICD-10-CM | POA: Diagnosis not present

## 2021-03-01 DIAGNOSIS — L298 Other pruritus: Secondary | ICD-10-CM | POA: Diagnosis not present

## 2021-03-01 DIAGNOSIS — L819 Disorder of pigmentation, unspecified: Secondary | ICD-10-CM | POA: Diagnosis not present

## 2021-03-03 ENCOUNTER — Ambulatory Visit (INDEPENDENT_AMBULATORY_CARE_PROVIDER_SITE_OTHER): Payer: Medicare HMO | Admitting: *Deleted

## 2021-03-03 DIAGNOSIS — R351 Nocturia: Secondary | ICD-10-CM

## 2021-03-03 DIAGNOSIS — E782 Mixed hyperlipidemia: Secondary | ICD-10-CM | POA: Diagnosis not present

## 2021-03-03 DIAGNOSIS — N401 Enlarged prostate with lower urinary tract symptoms: Secondary | ICD-10-CM | POA: Diagnosis not present

## 2021-03-03 DIAGNOSIS — F439 Reaction to severe stress, unspecified: Secondary | ICD-10-CM

## 2021-03-03 DIAGNOSIS — M545 Low back pain, unspecified: Secondary | ICD-10-CM

## 2021-03-03 DIAGNOSIS — I10 Essential (primary) hypertension: Secondary | ICD-10-CM | POA: Diagnosis not present

## 2021-03-03 DIAGNOSIS — G8929 Other chronic pain: Secondary | ICD-10-CM

## 2021-03-03 DIAGNOSIS — E1165 Type 2 diabetes mellitus with hyperglycemia: Secondary | ICD-10-CM

## 2021-03-03 NOTE — Patient Instructions (Signed)
Visit Information   PATIENT GOALS:   Goals Addressed               This Visit's Progress     Reduce and Manage Symptoms of Anxiety and Stress. (pt-stated)   On track     Timeframe:  Short-Term Goal Priority:  High Start Date:   03/03/2021                     Expected End Date:  05/04/2021              Follow-Up Date: 03/10/2021 at 1:00pm.   Patient Goals/Self-Care Activities: Avoid negative self-talk and practice positive thinking and self-talk. Initiate discussion with wife and two sons about receiving family counseling services.   Verbal consent obtained to place referral to [facility] for ongoing mental health counseling and supportive services, both individually, as well as family. Exercise at least 2 to 3 times per week, or as tolerated. Begin personal counseling with LCSW on a weekly basis, beginning on 03/03/2021, to reduce and manage symptoms of Anxiety and Stress, until established with a community provider. Consider self-enrollment in a caregiver support group.     Practice relaxation techniques, deep breathing exercises and mindfulness meditation strategies, daily. Continue with compliance of taking prescription medications, try to obtain adequate rest, stay well-hydrated and eat a healthy, well-balanced diet.        Consent to CCM Services: Robert Cordova was given information about Chronic Care Management services today including:  CCM service includes personalized support from designated clinical staff supervised by his physician, including individualized plan of care and coordination with other care providers 24/7 contact phone numbers for assistance for urgent and routine care needs. Service will only be billed when office clinical staff spend 20 minutes or more in a month to coordinate care. Only one practitioner may furnish and bill the service in a calendar month. The patient may stop CCM services at any time (effective at the end of the month) by phone call  to the office staff. The patient will be responsible for cost sharing (co-pay) of up to 20% of the service fee (after annual deductible is met).  Patient agreed to services and verbal consent obtained.   Patient verbalizes understanding of instructions provided today and agrees to view in [place].   Telephone follow-up appointment with care management team member scheduled for:  03/10/2021 at 1:00pm.  Nat Christen LCSW Licensed Clinical Social Worker [facility] [phone]   CLINICAL CARE PLAN: Patient Care Plan: LCSW Plan of Care.     Problem Identified: Reduce and Manage Symptoms of Anxiety and Stress.   Priority: High     Goal: Reduce and Manage Symptoms of Anxiety and Stress.   Start Date: 03/03/2021  Expected End Date: 05/04/2021  This Visit's Progress: On track  Priority: High  Note:   Current Barriers:   Acute Mental Health needs related to Anxiety, Stress, Caregiver Burnout, Family/Relationship Dysfunction and Hypertension.   Mental Health Concerns, Family/Relationship Dysfunction and Lacks Knowledge of [employer]. Needs Support, Education, and Care Coordination in order to meet unmet mental health needs. Clinical Goal(s):  Over the next 60 days, patient will work with LCSW to reduce and manage symptoms of Anxiety, Stress, Caregiver Burnout and Family/Relationship Dysfunction. Patient will increase knowledge and/or ability of:  Coping Skills, Healthy Habits, Self-Management Skills, Stress Reduction, Home Safety and Utilizing [employer] and Resources. Clinical Interventions:  Assessed patient's previous treatment, needs, coping skills, current treatment, support system  and barriers to care. PHQ-2 and PHQ-9 Depression Screening Tool performed and results reviewed with patient. Other interventions included:       Solution-Focused Therapy Performed, Mindfulness Meditation Strategies, Relaxation Techniques and Deep Breathing Exercises Encouraged,  Active Listening/       Reflection Utilized, Emotional Support Provided, Problem Solving [place], Psychoeducation/Health Education, Motivational        Interviewing, Brief Cognitive Behavioral Therapy Initiated, Reviewed Medications and Discussed Compliance, Quality of Sleep Assessed and       Sleep Hygiene Techniques Promoted, Support Group Participation Encouraged, Importance of Increased Level of Activity/Exercise Emphasized, Verbalization        of Feelings Encouraged, Suicidal Ideation/Homicidal Ideation Assessed - None Present. Patient interviewed and appropriate assessments performed. Provided mental health counseling with regards to Anxiety, Stress, Caregiver Burnout and Family/Relationship Dysfunction.     Discussed plans with patient for ongoing care management follow up and provided patient with direct contact information for care management team. Discussed several options for long-term counseling based on need and insurance through [facility].  Discussion of [facility] for ongoing mental health counseling and supportive services. Collaboration with Primary Care Physician, Dr. Grier Mitts regarding development and update of comprehensive plan of care as evidenced by provider attestation and co-signature. Inter-disciplinary care team collaboration (see longitudinal plan of care). Patient Goals/Self-Care Activities: Avoid negative self-talk and practice positive thinking and self-talk. Initiate discussion with wife and two sons about receiving family counseling services.   Verbal consent obtained to place referral to [facility] for ongoing mental health counseling and supportive services, both individually, as well as family. Exercise at least 2 to 3 times per week, or as tolerated. Begin personal counseling with LCSW on a weekly basis, beginning on 03/03/2021, to reduce and manage symptoms of Anxiety and Stress, until established with a community  provider. Consider self-enrollment in a caregiver support group.     Practice relaxation techniques, deep breathing exercises and mindfulness meditation strategies, daily. Continue with compliance of taking prescription medications, try to obtain adequate rest, stay well-hydrated and eat a healthy, well-balanced diet. Follow-Up:  03/10/2021 at 1:00pm.

## 2021-03-03 NOTE — Chronic Care Management (AMB) (Signed)
Chronic Care Management    Clinical Social Work Note  03/03/2021 Name: Robert Cordova MRN: [id number] DOB: [birth year]  Robert Cordova is a 70 y.o. year old male who is a primary care patient of Billie Ruddy, MD. The CCM team was consulted to assist the patient with chronic disease management and/or care coordination needs related to: [employer], Mental Health Counseling and Resources, and Caregiver Stress.   Engaged with patient by telephone for initial visit in response to provider referral for social work chronic care management and care coordination services.   Consent to Services:  The patient was given information about Chronic Care Management services, agreed to services, and gave verbal consent prior to initiation of services.  Please see initial visit note for detailed documentation.   Patient agreed to services and consent obtained.   Assessment: Review of patient past medical history, allergies, medications, and health status, including review of relevant consultants reports was performed today as part of a comprehensive evaluation and provision of chronic care management and care coordination services.     SDOH (Social Determinants of Health) assessments and interventions performed:  SDOH Interventions    Flowsheet Row Most Recent Value  SDOH Interventions   Food Insecurity Interventions Intervention Not Indicated  Financial Strain Interventions Intervention Not Indicated  Housing Interventions Intervention Not Indicated  Intimate Partner Violence Interventions Intervention Not Indicated  Physical Activity Interventions Intervention Not Indicated, Patient Refused  Stress Interventions [place], Provide Counseling, Other (Comment)  [Referred to [facility] for Ongoing Mental Health Counseling and Supportive Services.]  Social Connections Interventions Intervention Not Indicated, Patient Refused  Transportation Interventions  Intervention Not Indicated        Advanced Directives Status: See Care Plan for related entries.  CCM Care Plan  Allergies  Allergen Reactions   Ace Inhibitors     REACTION: Coughing    Outpatient Encounter Medications as of 03/03/2021  Medication Sig Note   aspirin 81 MG tablet Take 1 tablet (81 mg total) by mouth daily.    atorvastatin (LIPITOR) 20 MG tablet Take 1 tablet (20 mg total) by mouth daily.    Blood Glucose Calibration (TRUE METRIX LEVEL 1) Low SOLN Use as instructed    Blood Glucose Monitoring Suppl (TRUE METRIX AIR GLUCOSE METER) w/Device KIT 1 Device by Other route as directed.    Cholecalciferol (VITAMIN D PO) Take 50 mcg by mouth daily.    Coenzyme Q10 (CO Q-10) 200 MG CAPS Take by mouth daily.    glipiZIDE (GLUCOTROL) 5 MG tablet TAKE 1 TABLET TWICE DAILY BEFORE MEALS    glucose blood (TRUE METRIX BLOOD GLUCOSE TEST) test strip Use to check blood sugar 2x a day.    insulin glargine (LANTUS SOLOSTAR) 100 UNIT/ML Solostar Pen Inject 20 Units into the skin at bedtime.    Insulin Pen Needle 32G X 4 MM MISC Use 1x a day    JARDIANCE 25 MG TABS tablet TAKE 1 TABLET EVERY DAY    Lidocaine 1.8 % PTCH Apply topically.    losartan (COZAAR) 50 MG tablet TAKE 1 TABLET EVERY DAY    Magnesium 250 MG TABS Take by mouth daily.     meloxicam (MOBIC) 7.5 MG tablet TAKE 1 TABLET BY MOUTH DAILY. TAKE WITH FOOD    metFORMIN (GLUCOPHAGE) 500 MG tablet TAKE 2 TABLETS TWICE DAILY WITH MEALS    minoxidil (ROGAINE) 2 % external solution Apply 1 application topically daily.    Multiple Vitamins-Minerals (MULTIVITAMIN WITH MINERALS) tablet Take 1 tablet  by mouth daily. (Patient not taking: Reported on 02/23/2021)    OXYGEN-HELIUM IN Inhale into the lungs.  08/15/2016: States he had a sleep test;    OZEMPIC, 1 MG/DOSE, 4 MG/3ML SOPN INJECT 1 MG INTO THE SKIN ONCE A WEEK.    TRUEplus Lancets 33G MISC Use as instructed two times daily    vitamin B-12 (CYANOCOBALAMIN) 100 MCG tablet Take 100 mcg  by mouth daily.    No facility-administered encounter medications on file as of 03/03/2021.    Patient Active Problem List   Diagnosis Date Noted   Dyspnea on exertion 05/07/2018   Type 2 diabetes mellitus with hyperglycemia, without long-term current use of insulin ([place]) 01/25/2018   BPH associated with nocturia 05/15/2017   Flank pain 04/30/2017   Kidney stone 04/30/2017   Multiple thyroid nodules 10/13/2016   Nocturnal hypoxemia 06/04/2014   Lapband APS + hiatus hernia repair April 2015 11/25/2013   Obesity 11/25/2013   OSA (obstructive sleep apnea) 07/08/2013   Goiter 04/18/2013   Obesity, unspecified 04/10/2013   Osteoarthritis 07/26/2011   BLEPHARITIS, LEFT 10/29/2009   NEVUS, MELANOCYTIC, FACE 10/07/2009   Hyperlipemia 10/22/2007   HEMATURIA UNSPECIFIED 08/09/2007   Essential hypertension 03/18/2007   NECK PAIN, CHRONIC 03/18/2007    Conditions to be addressed/monitored: HTN, DMII, and Anxiety.  Mental Health Concerns, Family/Relationship Dysfunction, and Lacks Knowledge of [employer].  Care Plan : LCSW Plan of Care.  Updates made by Francis Gaines, LCSW since 03/03/2021 12:00 AM     Problem: Reduce and Manage Symptoms of Anxiety and Stress.   Priority: High     Goal: Reduce and Manage Symptoms of Anxiety and Stress.   Start Date: 03/03/2021  Expected End Date: 05/04/2021  This Visit's Progress: On track  Priority: High  Note:   Current Barriers:   Acute Mental Health needs related to Anxiety, Stress, Caregiver Burnout, Family/Relationship Dysfunction and Hypertension.   Mental Health Concerns, Family/Relationship Dysfunction and Lacks Knowledge of [employer]. Needs Support, Education, and Care Coordination in order to meet unmet mental health needs. Clinical Goal(s):  Over the next 60 days, patient will work with LCSW to reduce and manage symptoms of Anxiety, Stress, Caregiver Burnout and Family/Relationship Dysfunction. Patient will  increase knowledge and/or ability of:  Coping Skills, Healthy Habits, Self-Management Skills, Stress Reduction, Home Safety and Utilizing [employer] and Resources. Clinical Interventions:  Assessed patient's previous treatment, needs, coping skills, current treatment, support system and barriers to care. PHQ-2 and PHQ-9 Depression Screening Tool performed and results reviewed with patient. Other interventions included:       Solution-Focused Therapy Performed, Mindfulness Meditation Strategies, Relaxation Techniques and Deep Breathing Exercises Encouraged, Active Listening/       Reflection Utilized, Emotional Support Provided, Problem Solving [place], Psychoeducation/Health Education, Motivational        Interviewing, Brief Cognitive Behavioral Therapy Initiated, Reviewed Medications and Discussed Compliance, Quality of Sleep Assessed and       Sleep Hygiene Techniques Promoted, Support Group Participation Encouraged, Importance of Increased Level of Activity/Exercise Emphasized, Verbalization        of Feelings Encouraged, Suicidal Ideation/Homicidal Ideation Assessed - None Present. Patient interviewed and appropriate assessments performed. Provided mental health counseling with regards to Anxiety, Stress, Caregiver Burnout and Family/Relationship Dysfunction.     Discussed plans with patient for ongoing care management follow up and provided patient with direct contact information for care management team. Discussed several options for long-term counseling based on need and insurance through [facility].  Discussion of [facility] for ongoing mental health counseling and supportive services. Collaboration with Primary Care Physician, Dr. Grier Mitts regarding development and update of comprehensive plan of care as evidenced by provider attestation and co-signature. Inter-disciplinary care team collaboration (see longitudinal plan of care). Patient  Goals/Self-Care Activities: Avoid negative self-talk and practice positive thinking and self-talk. Initiate discussion with wife and two sons about receiving family counseling services.   Verbal consent obtained to place referral to [facility] for ongoing mental health counseling and supportive services, both individually, as well as family. Exercise at least 2 to 3 times per week, or as tolerated. Begin personal counseling with LCSW on a weekly basis, beginning on 03/03/2021, to reduce and manage symptoms of Anxiety and Stress, until established with a community provider. Consider self-enrollment in a caregiver support group.     Practice relaxation techniques, deep breathing exercises and mindfulness meditation strategies, daily. Continue with compliance of taking prescription medications, try to obtain adequate rest, stay well-hydrated and eat a healthy, well-balanced diet. Follow-Up:  03/10/2021 at 1:00pm.       Follow-Up Plan:  LCSW will follow-up with patient by phone on 03/10/2021 at 1:00pm.      Nat Christen [place] Licensed Clinical Social Worker [place] [phone]

## 2021-03-07 DIAGNOSIS — J9610 Chronic respiratory failure, unspecified whether with hypoxia or hypercapnia: Secondary | ICD-10-CM | POA: Diagnosis not present

## 2021-03-10 ENCOUNTER — Ambulatory Visit (INDEPENDENT_AMBULATORY_CARE_PROVIDER_SITE_OTHER): Payer: Medicare HMO | Admitting: *Deleted

## 2021-03-10 DIAGNOSIS — E1165 Type 2 diabetes mellitus with hyperglycemia: Secondary | ICD-10-CM

## 2021-03-10 DIAGNOSIS — I10 Essential (primary) hypertension: Secondary | ICD-10-CM | POA: Diagnosis not present

## 2021-03-10 DIAGNOSIS — G8929 Other chronic pain: Secondary | ICD-10-CM

## 2021-03-10 DIAGNOSIS — M545 Low back pain, unspecified: Secondary | ICD-10-CM

## 2021-03-10 DIAGNOSIS — F439 Reaction to severe stress, unspecified: Secondary | ICD-10-CM

## 2021-03-10 NOTE — Patient Instructions (Signed)
Visit Information  PATIENT GOALS:  Goals Addressed               This Visit's Progress     Reduce and Manage Symptoms of Anxiety and Stress. (pt-stated)   On track     Timeframe:  Short-Term Goal Priority:  High Start Date:   03/03/2021                     Expected End Date:  05/04/2021              Follow-Up Date: 03/17/2021 at 12:00pm.   Patient Goals/Self-Care Activities: Referral placed to [facility] for ongoing mental health counseling and supportive services, both individually, as well as family counseling.  A representative with [place] tried to make initial contact with you on 03/09/2021, leaving a HIPAA  compliant message on voicemail.  Please return call to [facility] to establish services, as well as schedule  the initial counseling session. Receive personal counseling with LCSW on a weekly basis, beginning on 03/03/2021, to reduce and manage symptoms of Anxiety and Stress, until established with [facility]. Consider self-enrollment in a caregiver support group.     Practice relaxation techniques, deep breathing exercises and mindfulness meditation strategies, daily. Continue with compliance of taking prescription medications, try to obtain adequate rest, stay well-hydrated and eat a healthy, well-balanced diet.        Patient verbalizes understanding of instructions provided today and agrees to view in [place].   Telephone follow-up appointment with care management team member scheduled for:  03/17/2021 at 12:00pm.  Nat Christen LCSW Licensed Clinical Social Worker [place] [phone]

## 2021-03-10 NOTE — Chronic Care Management (AMB) (Signed)
Chronic Care Management    Clinical Social Work Note  03/10/2021 Name: Robert Cordova MRN: [id number] DOB: 1951-01-18  [name] is a 70 y.o. year old male who is a primary care patient of Billie Ruddy, MD. The CCM team was consulted to assist the patient with chronic disease management and/or care coordination needs related to: Mental Health Counseling and Resources.   Engaged with patient by telephone for follow-up visit in response to provider referral for social work chronic care management and care coordination services.   Consent to Services:  The patient was given information about Chronic Care Management services, agreed to services, and gave verbal consent prior to initiation of services.  Please see initial visit note for detailed documentation.   Patient agreed to services and consent obtained.   Assessment: Review of patient past medical history, allergies, medications, and health status, including review of relevant consultants reports was performed today as part of a comprehensive evaluation and provision of chronic care management and care coordination services.     SDOH (Social Determinants of Health) assessments and interventions performed:    Advanced Directives Status: Not addressed in this encounter.  CCM Care Plan  Allergies  Allergen Reactions   Ace Inhibitors     REACTION: Coughing    Outpatient Encounter Medications as of 03/10/2021  Medication Sig Note   aspirin 81 MG tablet Take 1 tablet (81 mg total) by mouth daily.    atorvastatin (LIPITOR) 20 MG tablet Take 1 tablet (20 mg total) by mouth daily.    Blood Glucose Calibration (TRUE METRIX LEVEL 1) Low SOLN Use as instructed    Blood Glucose Monitoring Suppl (TRUE METRIX AIR GLUCOSE METER) w/Device KIT 1 Device by Other route as directed.    Cholecalciferol (VITAMIN D PO) Take 50 mcg by mouth daily.    Coenzyme Q10 (CO Q-10) 200 MG CAPS Take by mouth daily.    glipiZIDE (GLUCOTROL) 5 MG  tablet TAKE 1 TABLET TWICE DAILY BEFORE MEALS    glucose blood (TRUE METRIX BLOOD GLUCOSE TEST) test strip Use to check blood sugar 2x a day.    insulin glargine (LANTUS SOLOSTAR) 100 UNIT/ML Solostar Pen Inject 20 Units into the skin at bedtime.    Insulin Pen Needle 32G X 4 MM MISC Use 1x a day    JARDIANCE 25 MG TABS tablet TAKE 1 TABLET EVERY DAY    Lidocaine 1.8 % PTCH Apply topically.    losartan (COZAAR) 50 MG tablet TAKE 1 TABLET EVERY DAY    Magnesium 250 MG TABS Take by mouth daily.     meloxicam (MOBIC) 7.5 MG tablet TAKE 1 TABLET BY MOUTH DAILY. TAKE WITH FOOD    metFORMIN (GLUCOPHAGE) 500 MG tablet TAKE 2 TABLETS TWICE DAILY WITH MEALS    minoxidil (ROGAINE) 2 % external solution Apply 1 application topically daily.    Multiple Vitamins-Minerals (MULTIVITAMIN WITH MINERALS) tablet Take 1 tablet by mouth daily. (Patient not taking: Reported on 02/23/2021)    OXYGEN-HELIUM IN Inhale into the lungs.  08/15/2016: States he had a sleep test;    OZEMPIC, 1 MG/DOSE, 4 MG/3ML SOPN INJECT 1 MG INTO THE SKIN ONCE A WEEK.    TRUEplus Lancets 33G MISC Use as instructed two times daily    vitamin B-12 (CYANOCOBALAMIN) 100 MCG tablet Take 100 mcg by mouth daily.    No facility-administered encounter medications on file as of 03/10/2021.    Patient Active Problem List   Diagnosis Date Noted   Dyspnea on  exertion 05/07/2018   Type 2 diabetes mellitus with hyperglycemia, without long-term current use of insulin ([place]) 01/25/2018   BPH associated with nocturia 05/15/2017   Flank pain 04/30/2017   Kidney stone 04/30/2017   Multiple thyroid nodules 10/13/2016   Nocturnal hypoxemia 06/04/2014   Lapband APS + hiatus hernia repair April 2015 11/25/2013   Obesity 11/25/2013   OSA (obstructive sleep apnea) 07/08/2013   Goiter 04/18/2013   Obesity, unspecified 04/10/2013   Osteoarthritis 07/26/2011   BLEPHARITIS, LEFT 10/29/2009   NEVUS, MELANOCYTIC, FACE 10/07/2009   Hyperlipemia 10/22/2007    HEMATURIA UNSPECIFIED 08/09/2007   Essential hypertension 03/18/2007   NECK PAIN, CHRONIC 03/18/2007    Conditions to be addressed/monitored: Anxiety and Depression.  Limited Social Support, Mental Health Concerns, Family and Relationship Dysfunction, and Lacks Knowledge of [employer].  Care Plan : LCSW Plan of Care.  Updates made by Francis Gaines, LCSW since 03/10/2021 12:00 AM     Problem: Reduce and Manage Symptoms of Anxiety and Stress.   Priority: High     Goal: Reduce and Manage Symptoms of Anxiety and Stress.   Start Date: 03/03/2021  Expected End Date: 05/04/2021  This Visit's Progress: On track  Recent Progress: On track  Priority: High  Note:   Current Barriers:   Acute Mental Health needs related to Anxiety, Stress, Caregiver Burnout, Family/Relationship Dysfunction and Hypertension.   Mental Health Concerns, Family/Relationship Dysfunction and Lacks Knowledge of [employer]. Needs Support, Education, and Care Coordination in order to meet unmet mental health needs. Clinical Goal(s):  Over the next 60 days, patient will work with LCSW to reduce and manage symptoms of Anxiety, Stress, Caregiver Burnout and Family/Relationship Dysfunction. Patient will increase knowledge and/or ability of:  Coping Skills, Healthy Habits, Self-Management Skills, Stress Reduction, Home Safety and Utilizing [employer] and Resources. Clinical Interventions:  Assessed patient's previous treatment, needs, coping skills, current treatment, support system and barriers to care. PHQ-2 and PHQ-9 Depression Screening Tool performed and results reviewed with patient. Other interventions included:       Solution-Focused Therapy Performed, Mindfulness Meditation Strategies, Relaxation Techniques and Deep Breathing Exercises Encouraged, Active Listening/       Reflection Utilized, Emotional Support Provided, Problem Solving [place], Psychoeducation/Health  Education, Motivational        Interviewing, Brief Cognitive Behavioral Therapy Initiated, Reviewed Medications and Discussed Compliance, Quality of Sleep Assessed and       Sleep Hygiene Techniques Promoted, Support Group Participation Encouraged, Importance of Increased Level of Activity/Exercise Emphasized, Verbalization        of Feelings Encouraged, Suicidal Ideation/Homicidal Ideation Assessed - None Present. Patient interviewed and appropriate assessments performed. Provided mental health counseling with regards to Anxiety, Stress, Caregiver Burnout and Family/Relationship Dysfunction.     Discussed plans with patient for ongoing care management follow up and provided patient with direct contact information for care management team. Discussed several options for long-term counseling based on need and insurance through [facility].  Discussion of [facility] for ongoing mental health counseling and supportive services. Collaboration with Primary Care Physician, Dr. Grier Mitts regarding development and update of comprehensive plan of care as evidenced by provider attestation and co-signature. Inter-disciplinary care team collaboration (see longitudinal plan of care). Patient Goals/Self-Care Activities: Referral placed to [facility] for ongoing mental health counseling and supportive services, both individually, as well as family counseling. A representative with [facility] tried to make initial contact with you on 03/09/2021, leaving a HIPAA compliant message on voicemail.  Please return call to [facility] to establish services, as well as schedule the initial counseling session. Receive personal counseling with LCSW on a weekly basis, beginning on 03/03/2021, to reduce and manage symptoms of Anxiety and Stress, until established with [facility]. Consider self-enrollment in a caregiver support group.     Practice relaxation techniques, deep breathing exercises and  mindfulness meditation strategies, daily. Continue with compliance of taking prescription medications, try to obtain adequate rest, stay well-hydrated and eat a healthy, well-balanced diet. Follow-Up:  03/17/2021 at 12:00pm.       Follow-Up Plan:  LCSW will follow-up with patient by phone on 03/17/2021 at 12:00pm.      Nat Christen LCSW Licensed Clinical Social Worker [place] [phone]

## 2021-03-17 ENCOUNTER — Ambulatory Visit: Payer: Medicare HMO | Admitting: *Deleted

## 2021-03-17 DIAGNOSIS — G8929 Other chronic pain: Secondary | ICD-10-CM

## 2021-03-17 DIAGNOSIS — E1165 Type 2 diabetes mellitus with hyperglycemia: Secondary | ICD-10-CM

## 2021-03-17 DIAGNOSIS — F439 Reaction to severe stress, unspecified: Secondary | ICD-10-CM

## 2021-03-17 DIAGNOSIS — I10 Essential (primary) hypertension: Secondary | ICD-10-CM

## 2021-03-17 NOTE — Chronic Care Management (AMB) (Signed)
Chronic Care Management    Clinical Social Work Note  03/17/2021 Name: Robert Cordova MRN: [id number] DOB: 07-18-1951  Robert Cordova is a 70 y.o. year old male who is a primary care patient of Billie Ruddy, MD. The CCM team was consulted to assist the patient with chronic disease management and/or care coordination needs related to: Mental Health Counseling and Resources and Caregiver Stress.   Engaged with patient by telephone for follow-up visit in response to provider referral for social work chronic care management and care coordination services.   Consent to Services:  The patient was given information about Chronic Care Management services, agreed to services, and gave verbal consent prior to initiation of services.  Please see initial visit note for detailed documentation.   Patient agreed to services and consent obtained.   Assessment: Review of patient past medical history, allergies, medications, and health status, including review of relevant consultants reports was performed today as part of a comprehensive evaluation and provision of chronic care management and care coordination services.     SDOH (Social Determinants of Health) assessments and interventions performed:    Advanced Directives Status: Not addressed in this encounter.  CCM Care Plan  Allergies  Allergen Reactions   Ace Inhibitors     REACTION: Coughing    Outpatient Encounter Medications as of 03/17/2021  Medication Sig Note   aspirin 81 MG tablet Take 1 tablet (81 mg total) by mouth daily.    atorvastatin (LIPITOR) 20 MG tablet Take 1 tablet (20 mg total) by mouth daily.    Blood Glucose Calibration (TRUE METRIX LEVEL 1) Low SOLN Use as instructed    Blood Glucose Monitoring Suppl (TRUE METRIX AIR GLUCOSE METER) w/Device KIT 1 Device by Other route as directed.    Cholecalciferol (VITAMIN D PO) Take 50 mcg by mouth daily.    Coenzyme Q10 (CO Q-10) 200 MG CAPS Take by mouth daily.     glipiZIDE (GLUCOTROL) 5 MG tablet TAKE 1 TABLET TWICE DAILY BEFORE MEALS    glucose blood (TRUE METRIX BLOOD GLUCOSE TEST) test strip Use to check blood sugar 2x a day.    insulin glargine (LANTUS SOLOSTAR) 100 UNIT/ML Solostar Pen Inject 20 Units into the skin at bedtime.    Insulin Pen Needle 32G X 4 MM MISC Use 1x a day    JARDIANCE 25 MG TABS tablet TAKE 1 TABLET EVERY DAY    Lidocaine 1.8 % PTCH Apply topically.    losartan (COZAAR) 50 MG tablet TAKE 1 TABLET EVERY DAY    Magnesium 250 MG TABS Take by mouth daily.     meloxicam (MOBIC) 7.5 MG tablet TAKE 1 TABLET BY MOUTH DAILY. TAKE WITH FOOD    metFORMIN (GLUCOPHAGE) 500 MG tablet TAKE 2 TABLETS TWICE DAILY WITH MEALS    minoxidil (ROGAINE) 2 % external solution Apply 1 application topically daily.    Multiple Vitamins-Minerals (MULTIVITAMIN WITH MINERALS) tablet Take 1 tablet by mouth daily. (Patient not taking: Reported on 02/23/2021)    OXYGEN-HELIUM IN Inhale into the lungs.  08/15/2016: States he had a sleep test;    OZEMPIC, 1 MG/DOSE, 4 MG/3ML SOPN INJECT 1 MG INTO THE SKIN ONCE A WEEK.    TRUEplus Lancets 33G MISC Use as instructed two times daily    vitamin B-12 (CYANOCOBALAMIN) 100 MCG tablet Take 100 mcg by mouth daily.    No facility-administered encounter medications on file as of 03/17/2021.    Patient Active Problem List   Diagnosis Date Noted  Dyspnea on exertion 05/07/2018   Type 2 diabetes mellitus with hyperglycemia, without long-term current use of insulin ([place]) 01/25/2018   BPH associated with nocturia 05/15/2017   Flank pain 04/30/2017   Kidney stone 04/30/2017   Multiple thyroid nodules 10/13/2016   Nocturnal hypoxemia 06/04/2014   Lapband APS + hiatus hernia repair April 2015 11/25/2013   Obesity 11/25/2013   OSA (obstructive sleep apnea) 07/08/2013   Goiter 04/18/2013   Obesity, unspecified 04/10/2013   Osteoarthritis 07/26/2011   BLEPHARITIS, LEFT 10/29/2009   NEVUS, MELANOCYTIC, FACE 10/07/2009    Hyperlipemia 10/22/2007   HEMATURIA UNSPECIFIED 08/09/2007   Essential hypertension 03/18/2007   NECK PAIN, CHRONIC 03/18/2007    Conditions to be addressed/monitored: Anxiety and Depression.  Mental Health Concerns and Family and Relationship Dysfunction.  Care Plan : LCSW Plan of Care.  Updates made by Francis Gaines, LCSW since 03/17/2021 12:00 AM     Problem: Reduce and Manage Symptoms of Anxiety and Stress. Resolved 03/17/2021  Priority: High     Goal: Reduce and Manage Symptoms of Anxiety and Stress. Completed 03/17/2021  Start Date: 03/03/2021  Expected End Date: 03/17/2021  This Visit's Progress: On track  Recent Progress: On track  Priority: High  Note:   Current Barriers:   Acute Mental Health needs related to Anxiety, Stress, Caregiver Burnout, Family/Relationship Dysfunction and Hypertension.   Mental Health Concerns, Family/Relationship Dysfunction and Lacks Knowledge of [employer]. Needs Support, Education, and Care Coordination in order to meet unmet mental health needs. Clinical Goal(s):  Over the next 60 days, patient will work with LCSW to reduce and manage symptoms of Anxiety, Stress, Caregiver Burnout and Family/Relationship Dysfunction. Patient will increase knowledge and/or ability of:  Coping Skills, Healthy Habits, Self-Management Skills, Stress Reduction, Home Safety and Utilizing [employer] and Resources. Clinical Interventions:  Assessed patient's previous treatment, needs, coping skills, current treatment, support system and barriers to care. PHQ-2 and PHQ-9 Depression Screening Tool performed and results reviewed with patient. Other interventions included:       Solution-Focused Therapy Performed, Mindfulness Meditation Strategies, Relaxation Techniques and Deep Breathing Exercises Encouraged, Active Listening/       Reflection Utilized, Emotional Support Provided, Problem Solving [place], Psychoeducation/Health  Education, Motivational        Interviewing, Brief Cognitive Behavioral Therapy Initiated, Reviewed Medications and Discussed Compliance, Quality of Sleep Assessed and       Sleep Hygiene Techniques Promoted, Support Group Participation Encouraged, Importance of Increased Level of Activity/Exercise Emphasized, Verbalization        of Feelings Encouraged, Suicidal Ideation/Homicidal Ideation Assessed - None Present. Patient interviewed and appropriate assessments performed. Provided mental health counseling with regards to Anxiety, Stress, Caregiver Burnout and Family/Relationship Dysfunction.     Discussed plans with patient for ongoing care management follow up and provided patient with direct contact information for care management team. Discussed several options for long-term counseling based on need and insurance through [facility].  Discussion of [facility] for ongoing mental health counseling and supportive services. Collaboration with Primary Care Physician, Dr. Grier Mitts regarding development and update of comprehensive plan of care as evidenced by provider attestation and co-signature. Inter-disciplinary care team collaboration (see longitudinal plan of care). Patient Goals/Self-Care Activities: Continue to receive ongoing mental health counseling and supportive services for yourself, as well as your family, through [facility], to reduce and manage symptoms of Anxiety, Depression and Family Discord. Continue to consider self-enrollment in a caregiver support group.     Practice relaxation techniques,  deep breathing exercises and mindfulness meditation strategies, daily. Continue with compliance of taking prescription medications, try to obtain adequate rest, stay well-hydrated and eat a healthy, well-balanced diet. Contact LCSW (# [id number]) directly if additional social work needs are identified in the near future. Follow-Up:  No Follow-Up Required, Per Patient.        Follow-Up Plan:  No Follow-Up Required, Per Patient.      Nat Christen LCSW Licensed Clinical Social Worker [place] [phone]

## 2021-03-17 NOTE — Patient Instructions (Signed)
Visit Information  PATIENT GOALS:  Goals Addressed               This Visit's Progress     COMPLETED: Reduce and Manage Symptoms of Anxiety and Stress. (pt-stated)   On track     Timeframe:  Short-Term Goal Priority:  High Start Date:   03/03/2021                     Expected End Date:  03/17/2021            Follow-Up Date: No Follow-Up Required, Per Patient.   Patient Goals/Self-Care Activities: Continue to receive ongoing mental health counseling and supportive services for yourself, as well as your family, through [facility], to reduce and manage symptoms of Anxiety, Depression and Family Discord. Continue to consider self-enrollment in a caregiver support group.     Practice relaxation techniques, deep breathing exercises and mindfulness meditation strategies, daily. Continue with compliance of taking prescription medications, try to obtain adequate rest, stay well-hydrated and eat a healthy, well-balanced diet. Contact LCSW (# [id number]) directly if additional social work needs are identified in the near future.        Patient verbalizes understanding of instructions provided today and agrees to view in [place].   No Follow-Up Required, Per Patient.  Nat Christen LCSW Licensed Clinical Social Worker [place] [phone]

## 2021-03-23 ENCOUNTER — Other Ambulatory Visit: Payer: Self-pay | Admitting: Internal Medicine

## 2021-03-31 ENCOUNTER — Other Ambulatory Visit: Payer: Self-pay

## 2021-03-31 ENCOUNTER — Ambulatory Visit (INDEPENDENT_AMBULATORY_CARE_PROVIDER_SITE_OTHER): Payer: Medicare HMO | Admitting: Internal Medicine

## 2021-03-31 ENCOUNTER — Encounter: Payer: Self-pay | Admitting: Internal Medicine

## 2021-03-31 VITALS — BP 110/60 | HR 65 | Ht 70.0 in | Wt 219.6 lb

## 2021-03-31 DIAGNOSIS — E1165 Type 2 diabetes mellitus with hyperglycemia: Secondary | ICD-10-CM | POA: Diagnosis not present

## 2021-03-31 DIAGNOSIS — E042 Nontoxic multinodular goiter: Secondary | ICD-10-CM | POA: Diagnosis not present

## 2021-03-31 DIAGNOSIS — R200 Anesthesia of skin: Secondary | ICD-10-CM | POA: Diagnosis not present

## 2021-03-31 DIAGNOSIS — E782 Mixed hyperlipidemia: Secondary | ICD-10-CM | POA: Diagnosis not present

## 2021-03-31 LAB — POCT GLYCOSYLATED HEMOGLOBIN (HGB A1C): Hemoglobin A1C: 6.9 % — AB (ref 4.0–5.6)

## 2021-03-31 NOTE — Progress Notes (Signed)
Patient ID: Robert Cordova, male   DOB: [birth year], 70 y.o.   MRN: [id number]   This visit occurred during the SARS-CoV-2 public health emergency.  Safety protocols were in place, including screening questions prior to the visit, additional usage of staff PPE, and extensive cleaning of exam room while observing appropriate contact time as indicated for disinfecting solutions.   HPI: Robert Cordova is a 69 y.o.-year-old male, returning for f/u for DM2, dx in 2013, insulin-dependent since 04/2020, uncontrolled, with complications (h/o DKA). Last visit 4 months ago.  Interim history: No increased urination, blurry vision, nausea, chest pain. He continues to have fatigue. He had high iron level after she started B12 treatment.   Reviewed HbA1c levels: Lab Results  Component Value Date   HGBA1C 7.2 (A) 11/23/2020   HGBA1C 6.9 (H) 08/11/2020   HGBA1C 6.5 (A) 04/22/2020  08/22/2016: 7.7%  He is on: - Metformin 1000 mg 2x a day, with meals >> 2000 mg with dinner - Glipizide 10 mg after dinner >> 10 mg 30 minutes before dinner - Jardiance 25 mg before b'fast - Ozempic 0.5 mg weekly-added 09/2018 >> 1 mg weekly- increased 09/2019 - Lantus 12 units at bedtime-started 04/2020 >> 18-24 units >> 20 >> 18-20 units at bedtime Of note, He was off and on insulin in the past. He came off when he lost weight after his lab band surgery in 2014 (45 pounds)  He is checking sugars 1-2 times a day - am:  96-191 >> 136-201 >> 108, 130-173, 192 - 2h after b'fast: 87-125, 147 >> n/c >> 181, 215 >> n/c - before lunch: 137, 141-200 >> 99-122 >> 148 >> n/c - 2h after lunch: n/c >> 118-215 >> n/c >> 121 >> n/c - before dinner:  119 >> 108-134 >> 94, 138, 150 >> n/c - 2h after dinner:328 (cheese cake) >> n/c >> 208 >> n/c - bedtime:  102-188, 199 >> 178-232 >> 146-202 - nighttime: n/c >> 212 >> n/c  Lowest sugar was 114 >> 94 >> 96 >> 708; he has hypoglycemia awareness in the 70s. Highest sugar was  221 >> 221 >> 199 >> 202.  Pt's meals are: - Breakfast: Protein shake - Lunch: Eggs, bacon or leftovers - Dinner: Meat + veggies + starch, pizza, [place] food - Snacks: In the evening: cookies, etc.  -No CKD, last BUN/creatinine:  Lab Results  Component Value Date   BUN 17 08/11/2020   BUN 14 07/23/2019   CREATININE 0.75 08/11/2020   CREATININE 0.65 07/23/2019  On losartan 25 mg twice a day.  -+ HL; last set of lipids: Lab Results  Component Value Date   CHOL 114 08/11/2020   HDL 38.20 (L) 08/11/2020   LDLCALC 49 08/11/2020   LDLDIRECT 72.0 05/07/2018   TRIG 137.0 08/11/2020   CHOLHDL 3 08/11/2020  On Lipitor 20, co-Q10, Krill oil  - last eye exam was on 10/12/2020: No DR, + incipient cataract. [facility].  She sees Dr. Nicki Reaper and Dr. Katherene Ponto.  -+ Numbness in the toes  B12 was elevated in 01/2020-we increased his supplement dose from 5000 to 2500 mcg daily.   However, at last visit he was only on 15 mcg daily and I advised him to add 500 mcg daily.  He now takes 1000 mcg daily. Stopped B complex.  Reviewed his B12 level: Lab Results  Component Value Date   VITAMINB12 599 11/23/2020   VITAMINB12 >1526 (H) 01/06/2020   Thyroid nodules: - s/p 2 benign biopsies in  2014  Pt denies: - feeling nodules in neck - hoarseness - dysphagia - choking - SOB with lying down  Reviewed thyroid imaging results and biopsy results: Thyroid U/S (10/20/2016): Stable 2.8 cm solid nodule in the inferior right lobe.  This was previously sampled with negative biopsy.  The 1.8 cm nodule previously sampled in the right isthmus was no longer identified by ultrasound.  Thyroid U/S (01/15/2020): COMPARISON:  10/19/2016  FINDINGS: Parenchymal Echotexture: Mildly heterogenous Isthmus: 0.9 cm Right lobe: 6.1 x 2.6 x 1.9 cm. Left lobe: 5.4 x 2.3 x 1.3 cm _________________________________________________________  Nodule # 2: Prior biopsy: Yes Location: Right; Inferior Maximum  size: 3.7 cm; Other 2 dimensions: 3.1 x 2.8 cm, previously, 2.8 x 2.6 x 2.1 cm Composition: solid/almost completely solid (2) Echogenicity: isoechoic (1) Significant change in size (>/= 20% in two dimensions and minimal increase of 2 mm): Yes  **Given size (>/= 2.5 cm) and appearance, fine needle aspiration of this mildly suspicious nodule should be considered based on TI-RADS criteria. _______________________________________________________  There is a stable small hypoechoic nodule measuring approximately 7 mm in the right mid thyroid gland.  IMPRESSION: Interval increase in size of the dominant right-sided thyroid nodule, currently measuring 3.7 cm (previously measuring 2.8 cm). While this thyroid nodule was previously biopsied, consider repeat ultrasound or fine-needle aspiration given the nodule's significant interval growth.  Thyroid FNA (01/20/2020): FINAL MICROSCOPIC DIAGNOSIS:  - Consistent with benign follicular nodule (Bethesda category II)   SPECIMEN ADEQUACY:  Satisfactory for evaluation   Latest TSH was normal: Lab Results  Component Value Date   TSH 0.82 11/23/2020   He also has sleep-related hypoxia, HTN.  He has Medicare with 2 supplemental insurances.  ROS: Constitutional: no weight gain/no weight loss, + fatigue, no subjective hyperthermia, no subjective hypothermia Eyes: no blurry vision, no xerophthalmia ENT: no sore throat, + see HPI Cardiovascular: no CP/no SOB/no palpitations/no leg swelling Respiratory: no cough/no SOB/no wheezing Gastrointestinal: no N/no V/no D/no C/no acid reflux Musculoskeletal: no muscle aches/no joint aches Skin: no rashes, no hair loss Neurological: + numbness/no tingling/no dizziness  I reviewed pt's medications, allergies, PMH, social hx, family hx, and changes were documented in the history of present illness. Otherwise, unchanged from my initial visit note.   Past Medical History:  Diagnosis Date   ABNORMAL  GLUCOSE NEC 03/18/2007   Arthritis    fingers   BLEPHARITIS, LEFT 10/29/2009   DIABETES-TYPE 2 04/18/2010   Dry throat    Environmental allergies    GERD (gastroesophageal reflux disease)    H/O hiatal hernia    HYPERLIPIDEMIA 10/22/2007   HYPERTENSION 03/18/2007   Multiple thyroid nodules    "biopsy done, everything was fine" being monitored   NEVUS, MELANOCYTIC, FACE 10/07/2009   Past Surgical History:  Procedure Laterality Date   BIOPSY THYROID  ~08/2013   BREATH TEK H PYLORI N/A 09/05/2013   Procedure: BREATH TEK H PYLORI;  Surgeon: [name], MD;  Location: [facility];  Service: General;  Laterality: N/A;   CATARACT EXTRACTION Bilateral    COLONOSCOPY  2009, 2013   Dr. 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 30 tablet 5   atorvastatin (LIPITOR) 20 MG tablet Take 1 tablet (20 mg total) by mouth daily. 90 tablet 3   Blood Glucose Calibration (TRUE METRIX LEVEL 1) Low SOLN Use as instructed 1 each 0   Blood Glucose Monitoring Suppl (TRUE METRIX AIR GLUCOSE METER) w/Device KIT 1 Device by Other route as directed. 1 kit 0   Cholecalciferol (VITAMIN D PO) Take 50 mcg by mouth daily.      Coenzyme Q10 (CO Q-10) 200 MG CAPS Take by mouth daily.     glipiZIDE (GLUCOTROL) 5 MG tablet TAKE 1 TABLET TWICE DAILY BEFORE MEALS 180 tablet 3   glucose blood (TRUE METRIX BLOOD GLUCOSE TEST) test strip Use to check blood sugar 2x a day. 200 each 3   insulin glargine (LANTUS SOLOSTAR) 100 UNIT/ML Solostar Pen Inject 20 Units into the skin at bedtime. 30 mL 3   Insulin Pen Needle 32G X 4 MM MISC Use 1x a day 100 each 3   JARDIANCE 25 MG TABS tablet TAKE 1 TABLET EVERY DAY 90 tablet 1   Lidocaine 1.8 % PTCH Apply topically.     losartan (COZAAR) 50 MG tablet TAKE 1 TABLET EVERY DAY 90 tablet 0   Magnesium 250 MG TABS Take by mouth daily.      meloxicam (MOBIC) 7.5 MG tablet TAKE 1 TABLET BY MOUTH DAILY. TAKE WITH FOOD     metFORMIN (GLUCOPHAGE) 500 MG tablet TAKE 2 TABLETS TWICE DAILY WITH MEALS 360 tablet 3   minoxidil (ROGAINE) 2 % external solution Apply 1 application topically daily. 60 mL 10   Multiple Vitamins-Minerals (MULTIVITAMIN WITH MINERALS) tablet Take 1 tablet by mouth daily. (Patient not taking: Reported on 02/23/2021)     OXYGEN-HELIUM IN Inhale into the lungs.      OZEMPIC, 1 MG/DOSE, 4 MG/3ML SOPN INJECT 1 MG INTO THE SKIN ONCE A WEEK. 9 mL 1   TRUEplus Lancets 33G MISC Use as instructed two times daily 300 each 2   vitamin B-12 (CYANOCOBALAMIN) 100 MCG tablet Take 100 mcg by mouth daily.     No current facility-administered medications on file prior to visit.   Allergies  Allergen Reactions   Ace Inhibitors     REACTION: Coughing   Family History  Problem Relation Age of Onset   Hyperlipidemia Mother    Hypertension Mother    Arthritis Mother    Cancer Mother    Hyperlipidemia Father    Hypertension Father    Heart attack Father    Hyperlipidemia Brother    Diabetes Maternal Grandmother    Diabetes Maternal Grandfather    Colon cancer Neg Hx    Stomach cancer Neg Hx    Esophageal cancer Neg Hx    Rectal cancer Neg Hx     PE: BP 110/60 (BP Location: Right  Arm, Patient Position: Sitting, Cuff Size: Normal)   Pulse 65   Ht _0  (1.778 m)   Wt 219 lb 9.6 oz (  99.6 kg)   SpO2 95%   BMI 31.51 kg/m  Wt Readings from Last 3 Encounters:  03/31/21 219 lb 9.6 oz (99.6 kg)  11/23/20 227 lb (103 kg)  08/25/20 218 lb 3.2 oz (99 kg)   Constitutional: overweight, in NAD Eyes: PERRLA, EOMI, no exophthalmos ENT: moist mucous membranes, no thyromegaly but R inf thyroid nodule palpated, measuring approximately 2.5 cm, no cervical lymphadenopathy Cardiovascular: RRR, No MRG Respiratory: CTA B Gastrointestinal: abdomen soft, NT, ND, BS+ Musculoskeletal: no deformities, strength intact in all 4 Skin: moist, warm, no rashes Neurological: no tremor with outstretched hands, DTR normal in all 4  ASSESSMENT: 1. DM2, non-insulin-dependent, uncontrolled, without long term complications, but with hyperglycemia - h/o DKA  2.  Multinodular goiter  3. HL  4.  Numbness in toes/high B12 level  PLAN:  1. Patient with longstanding, uncontrolled, type 2 diabetes, on oral antidiabetic regimen with metformin, sulfonylurea, SGLT2 inhibitor, and also weekly GLP-1 receptor agonist and daily insulin dose with still poor control.  At last visit, HbA1c was higher, at 7.2%, increased from 6.9%.  At that time, I advised him to try to stop eating after dinner but if he could not do so or if sugars remain high in the morning, to increase his Lantus dose.  We continued the rest of the regimen. - at ths visit, sugars are still high in am, mostly above target, as he continues to have a snack at night.  We again discussed about the importance of stopping this but I also advised him to increase the Lantus dose.  He is not checking sugars later in the day and we discussed about trying to do so.  We can also increase the Ozempic dose to 2 mg weekly, but this is on backorder for now.  We can do this at next visit. - I suggested to:  Patient Instructions  Please continue:  - Metformin  2000 mg with dinner - Glipizide 10 mg 30 min before dinner - Jardiance 25 mg before b'fast - Ozempic 1 mg weekly  Please use: - Lantus 20-24 units at bedtime  Try to stop eating after dinner.  Please return in 4 months with your sugar log  - we checked his HbA1c: 6.9% (lower) - advised to check sugars at different times of the day - 1-2x a day, rotating check times - advised for yearly eye exams >> he is UTD - return to clinic in 4 months  2. Thyroid nodules and goiter -He has a history of 2 thyroid nodules, of which only 1 was seen on the most recent ultrasound -Nodules appeared stable in size on the thyroid ultrasound from 2018.  However, a later thyroid ultrasound from 01/2020 showed an increase in the right inferior dominant nodule size from 2.8 to 3.7 cm.  An FNA was benign -Reviewed latest TSH which was normal: Lab Results  Component Value Date   TSH 0.82 11/23/2020  -No neck compression symptoms -Plan to repeat the ultrasound next year.  3. HL -Reviewed latest lipid panel from 08/2020: Fractions at goal, with exception of a slightly low HDL: Lab Results  Component Value Date   CHOL 114 08/11/2020   HDL 38.20 (L) 08/11/2020   LDLCALC 49 08/11/2020   LDLDIRECT 72.0 05/07/2018   TRIG 137.0 08/11/2020   CHOLHDL 3 08/11/2020  -He continues on Lipitor 20 mg daily, Krill oil, co-Q10, without side effects  4.  Elevated B12 -We checked a B12 level in the past due to numbness in  his hands and this was elevated.  We decreased his B12 supplement 5000 mcg to 2500 mcg daily.  However, at last visit, he was only taking 15 mcg in B complex. -His B12 level was 599, normal, at that time, but I predicted that this would drop more so I advised him to add 1000 mcg every other day.  He ended up adding this every day and stopped the B complex -We will recheck his B12 level  at next OV   Philemon Kingdom, MD PhD [facility]

## 2021-03-31 NOTE — Patient Instructions (Addendum)
Please continue:  - Metformin 2000 mg with dinner - Glipizide 10 mg 30 min before dinner - Jardiance 25 mg before b'fast - Ozempic 1 mg weekly  Please use: - Lantus 20-24 units at bedtime  Try to stop eating after dinner.  Please return in 4 months with your sugar log

## 2021-04-07 DIAGNOSIS — J9610 Chronic respiratory failure, unspecified whether with hypoxia or hypercapnia: Secondary | ICD-10-CM | POA: Diagnosis not present

## 2021-05-07 DIAGNOSIS — J9610 Chronic respiratory failure, unspecified whether with hypoxia or hypercapnia: Secondary | ICD-10-CM | POA: Diagnosis not present

## 2021-05-29 ENCOUNTER — Encounter: Payer: Self-pay | Admitting: Family Medicine

## 2021-05-30 ENCOUNTER — Telehealth (INDEPENDENT_AMBULATORY_CARE_PROVIDER_SITE_OTHER): Payer: Medicare HMO | Admitting: Family Medicine

## 2021-05-30 DIAGNOSIS — U071 COVID-19: Secondary | ICD-10-CM | POA: Diagnosis not present

## 2021-05-30 MED ORDER — MOLNUPIRAVIR EUA 200MG CAPSULE: 4.0000 | ORAL_CAPSULE | Freq: Two times a day (BID) | ORAL | 0 refills | Status: AC

## 2021-05-30 NOTE — Progress Notes (Signed)
Patient ID: Robert Cordova, male   DOB: 1950/09/09, 70 y.o.   MRN: [id number]  This visit type was conducted due to national recommendations for restrictions regarding the COVID-19 pandemic in an effort to limit this patient's exposure and mitigate transmission in our community.   Virtual Visit via Video Note  I connected with Robert Cordova on 05/30/21 at  4:15 PM EDT by a video enabled telemedicine application and verified that I am speaking with the correct person using two identifiers.  Location patient: home Location provider:work or home office Persons participating in the virtual visit: patient, provider  I discussed the limitations of evaluation and management by telemedicine and the availability of in person appointments. The patient expressed understanding and agreed to proceed.   HPI:  Patient has COVID-19 infection.  He states his mom was actually admitted this past Friday with COVID.  He helped take care of her.  By Saturday he had relatively mild symptoms but worse Sunday and today.  Fever up to 101.  He has had some headaches and nasal congestion.  Cough relatively mild.  No significant dyspnea.  Home pulse oximeter mostly around 95%.  He has had 4 total vaccine thus far.  Positive home COVID test.  He has comorbidities including type 2 diabetes, hypertension, obstructive sleep apnea.  ROS: See pertinent positives and negatives per HPI.  Past Medical History:  Diagnosis Date   ABNORMAL GLUCOSE NEC 03/18/2007   Arthritis    fingers   BLEPHARITIS, LEFT 10/29/2009   DIABETES-TYPE 2 04/18/2010   Dry throat    Environmental allergies    GERD (gastroesophageal reflux disease)    H/O hiatal hernia    HYPERLIPIDEMIA 10/22/2007   HYPERTENSION 03/18/2007   Multiple thyroid nodules    "biopsy done, everything was fine" being monitored   NEVUS, MELANOCYTIC, FACE 10/07/2009    Past Surgical History:  Procedure Laterality Date   BIOPSY THYROID  ~08/2013   BREATH TEK H PYLORI  N/A 09/05/2013   Procedure: [place];  Surgeon: [name], MD;  Location: [facility];  Service: General;  Laterality: N/A;   CATARACT EXTRACTION Bilateral    COLONOSCOPY  2009, 2013   Dr. Sharlett Iles; multiple polyps   HIATAL HERNIA REPAIR  11/25/2013   Procedure: LAPAROSCOPIC REPAIR OF HIATAL HERNIA;  Surgeon: [name], MD;  Location: [facility];  Service: General;;   LAPAROSCOPIC GASTRIC BANDING N/A 11/25/2013   Procedure: LAPAROSCOPIC GASTRIC BANDING;  Surgeon: [name], MD;  Location: [facility];  Service: General;  Laterality: N/A;   NO PAST SURGERIES      Family History  Problem Relation Age of Onset   Hyperlipidemia Mother    Hypertension Mother    Arthritis Mother    Cancer Mother    Hyperlipidemia Father    Hypertension Father    Heart attack Father    Hyperlipidemia Brother    Diabetes Maternal Grandmother    Diabetes Maternal Grandfather    Colon cancer Neg Hx    Stomach cancer Neg Hx    Esophageal cancer Neg Hx    Rectal cancer Neg Hx     SOCIAL HX: Non-smoker   Current Outpatient Medications:    molnupiravir EUA (LAGEVRIO) 200 mg CAPS capsule, Take 4 capsules (800 mg total) by mouth 2 (two) times daily for 5 days., Disp: 40 capsule, Rfl: 0   aspirin 81 MG tablet, Take 1 tablet (81 mg total) by mouth daily., Disp: 30 tablet, Rfl: 5   atorvastatin (  LIPITOR) 20 MG tablet, Take 1 tablet (20 mg total) by mouth daily., Disp: 90 tablet, Rfl: 3   Blood Glucose Calibration (TRUE METRIX LEVEL 1) Low SOLN, Use as instructed, Disp: 1 each, Rfl: 0   Blood Glucose Monitoring Suppl (TRUE METRIX AIR GLUCOSE METER) w/Device KIT, 1 Device by Other route as directed., Disp: 1 kit, Rfl: 0   Cholecalciferol (VITAMIN D PO), Take 50 mcg by mouth daily., Disp: , Rfl:    Coenzyme Q10 (CO Q-10) 200 MG CAPS, Take by mouth daily., Disp: , Rfl:    glipiZIDE (GLUCOTROL) 5 MG tablet, TAKE 1 TABLET TWICE DAILY BEFORE MEALS, Disp: 180 tablet, Rfl: 3   glucose blood (TRUE  METRIX BLOOD GLUCOSE TEST) test strip, Use to check blood sugar 2x a day., Disp: 200 each, Rfl: 3   insulin glargine (LANTUS SOLOSTAR) 100 UNIT/ML Solostar Pen, Inject 20 Units into the skin at bedtime., Disp: 30 mL, Rfl: 3   Insulin Pen Needle 32G X 4 MM MISC, Use 1x a day, Disp: 100 each, Rfl: 3   JARDIANCE 25 MG TABS tablet, TAKE 1 TABLET EVERY DAY, Disp: 90 tablet, Rfl: 1   Lidocaine 1.8 % PTCH, Apply topically., Disp: , Rfl:    losartan (COZAAR) 50 MG tablet, TAKE 1 TABLET EVERY DAY, Disp: 90 tablet, Rfl: 0   Magnesium 250 MG TABS, Take by mouth daily. , Disp: , Rfl:    meloxicam (MOBIC) 7.5 MG tablet, TAKE 1 TABLET BY MOUTH DAILY. TAKE WITH FOOD, Disp: , Rfl:    metFORMIN (GLUCOPHAGE) 500 MG tablet, TAKE 2 TABLETS TWICE DAILY WITH MEALS, Disp: 360 tablet, Rfl: 3   minoxidil (ROGAINE) 2 % external solution, Apply 1 application topically daily., Disp: 60 mL, Rfl: 10   Multiple Vitamins-Minerals (MULTIVITAMIN WITH MINERALS) tablet, Take 1 tablet by mouth daily. (Patient not taking: Reported on 02/23/2021), Disp: , Rfl:    OXYGEN-HELIUM IN, Inhale into the lungs. , Disp: , Rfl:    OZEMPIC, 1 MG/DOSE, 4 MG/3ML SOPN, INJECT 1 MG INTO THE SKIN ONCE A WEEK., Disp: 9 mL, Rfl: 1   TRUEplus Lancets 33G MISC, Use as instructed two times daily, Disp: 300 each, Rfl: 2   vitamin B-12 (CYANOCOBALAMIN) 100 MCG tablet, Take 100 mcg by mouth daily., Disp: , Rfl:   EXAM:  VITALS per patient if applicable:  GENERAL: alert, oriented, appears well and in no acute distress  HEENT: atraumatic, conjunttiva clear, no obvious abnormalities on inspection of external nose and ears  NECK: normal movements of the head and neck  LUNGS: on inspection no signs of respiratory distress, breathing rate appears normal, no obvious gross SOB, gasping or wheezing  CV: no obvious cyanosis  MS: moves all visible extremities without noticeable abnormality  PSYCH/NEURO: pleasant and cooperative, no obvious depression or  anxiety, speech and thought processing grossly intact  ASSESSMENT AND PLAN:  Discussed the following assessment and plan:  COVID-19 infection  Patient is doing relatively well at this time but he does have significant comorbidities as above.  We discussed starting antiviral therapy with Molnupiravir 4 capsules by mouth twice daily for 5 days -Plenty of fluids and rest -Continue to monitor pulse oximetry and be in touch for O2 sats less than 90%     I discussed the assessment and treatment plan with the patient. The patient was provided an opportunity to ask questions and all were answered. The patient agreed with the plan and demonstrated an understanding of the instructions.   The patient was  advised to call back or seek an in-person evaluation if the symptoms worsen or if the condition fails to improve as anticipated.     Carolann Littler, MD

## 2021-06-07 DIAGNOSIS — J9610 Chronic respiratory failure, unspecified whether with hypoxia or hypercapnia: Secondary | ICD-10-CM | POA: Diagnosis not present

## 2021-06-29 ENCOUNTER — Other Ambulatory Visit: Payer: Self-pay | Admitting: Family Medicine

## 2021-06-29 DIAGNOSIS — I10 Essential (primary) hypertension: Secondary | ICD-10-CM

## 2021-07-02 ENCOUNTER — Other Ambulatory Visit: Payer: Self-pay | Admitting: Family Medicine

## 2021-07-02 DIAGNOSIS — E785 Hyperlipidemia, unspecified: Secondary | ICD-10-CM

## 2021-08-07 DIAGNOSIS — J9610 Chronic respiratory failure, unspecified whether with hypoxia or hypercapnia: Secondary | ICD-10-CM | POA: Diagnosis not present

## 2021-08-09 ENCOUNTER — Ambulatory Visit: Payer: Medicare HMO | Admitting: Internal Medicine

## 2021-08-09 ENCOUNTER — Other Ambulatory Visit: Payer: Self-pay

## 2021-08-09 ENCOUNTER — Encounter: Payer: Self-pay | Admitting: Internal Medicine

## 2021-08-09 VITALS — BP 130/70 | HR 84 | Ht 70.0 in | Wt 220.2 lb

## 2021-08-09 DIAGNOSIS — E1165 Type 2 diabetes mellitus with hyperglycemia: Secondary | ICD-10-CM

## 2021-08-09 DIAGNOSIS — E042 Nontoxic multinodular goiter: Secondary | ICD-10-CM | POA: Diagnosis not present

## 2021-08-09 DIAGNOSIS — E782 Mixed hyperlipidemia: Secondary | ICD-10-CM | POA: Diagnosis not present

## 2021-08-09 LAB — POCT GLYCOSYLATED HEMOGLOBIN (HGB A1C): Hemoglobin A1C: 7.1 % — AB (ref 4.0–5.6)

## 2021-08-09 MED ORDER — EMPAGLIFLOZIN 25 MG PO TABS: 25.0000 mg | ORAL_TABLET | Freq: Every day | ORAL | 3 refills | Status: DC

## 2021-08-09 MED ORDER — SEMAGLUTIDE (2 MG/DOSE) 8 MG/3ML ~~LOC~~ SOPN: 2.0000 mg | PEN_INJECTOR | SUBCUTANEOUS | 3 refills | Status: DC

## 2021-08-09 MED ORDER — LANTUS SOLOSTAR 100 UNIT/ML ~~LOC~~ SOPN: 20.0000 [IU] | PEN_INJECTOR | Freq: Every day | SUBCUTANEOUS | 3 refills | Status: DC

## 2021-08-09 NOTE — Patient Instructions (Addendum)
Please continue:  - Metformin 2000 mg with dinner - Glipizide 10 mg 30 min before dinner - Jardiance 25 mg before b'fast - Lantus 20-24 units at bedtime  Please increase: - Ozempic 2 mg weekly  Please return in 4 months with your sugar log

## 2021-08-09 NOTE — Progress Notes (Signed)
Patient ID: Robert Cordova, male   DOB: 1950-08-24, 71 y.o.   MRN: [id number]   This visit occurred during the SARS-CoV-2 public health emergency.  Safety protocols were in place, including screening questions prior to the visit, additional usage of staff PPE, and extensive cleaning of exam room while observing appropriate contact time as indicated for disinfecting solutions.   HPI: Robert Cordova is a 71 y.o.-year-old male, returning for f/u for DM2, dx in 2013, insulin-dependent since 04/2020, uncontrolled, with complications (h/o DKA). Last visit 4 months ago.  Interim history: He denies increased urination, blurry vision, nausea, chest pain. Since last visit, he had COVID-19, along with his entire family in 05/2021.  Reviewed HbA1c levels: Lab Results  Component Value Date   HGBA1C 6.9 (A) 03/31/2021   HGBA1C 7.2 (A) 11/23/2020   HGBA1C 6.9 (H) 08/11/2020  08/22/2016: 7.7%  He is on: - Metformin 1000 mg 2x a day, with meals >> 2000 mg with dinner - Glipizide 10 mg after dinner >> 10 mg 30 minutes before dinner - Jardiance 25 mg before b'fast - Ozempic 0.5 mg weekly-added 09/2018 >> 1 mg weekly- increased 09/2019 - Lantus -started 04/2020 >> 18-24 units >> 20 >> 18-20 >> 20-24 >> 24 (actually: 10-30) units at bedtime Of note, He was off and on insulin in the past. He came off when he lost weight after his lab band surgery in 2014 (45 pounds)  He is checking sugars 1-2 times a day: - am:  96-191 >> 136-201 >> 108, 130-173, 192 >> 91-163 - 2h after b'fast: 87-125, 147 >> n/c >> 181, 215 >> n/c - before lunch: 137, 141-200 >> 99-122 >> 148 >> n/c >> 148 - 2h after lunch: n/c >> 118-215 >> n/c >> 121 >> n/c - before dinner:  119 >> 108-134 >> 94, 138, 150 >> n/c - 2h after dinner:328 (cheese cake) >> n/c >> 208 >> n/c - bedtime:   178-232 >> 146-202 >> 115-211, 228, 282 - nighttime: n/c >> 212 >> n/c  Lowest sugar was 114 >> 94 >> 96 >> 70 >> 90s; he has hypoglycemia  awareness in the 70s. Highest sugar was 221 >> 221 >> 199 >> 202 >> 282.  Pt's meals are: - Breakfast: Protein shake - Lunch: Eggs, bacon or leftovers - Dinner: Meat + veggies + starch, pizza, [place] food - Snacks: In the evening: cookies, etc.  -No CKD, last BUN/creatinine:  Lab Results  Component Value Date   BUN 17 08/11/2020   BUN 14 07/23/2019   CREATININE 0.75 08/11/2020   CREATININE 0.65 07/23/2019  On losartan 25 mg twice a day.  -+ HL; last set of lipids: Lab Results  Component Value Date   CHOL 114 08/11/2020   HDL 38.20 (L) 08/11/2020   LDLCALC 49 08/11/2020   LDLDIRECT 72.0 05/07/2018   TRIG 137.0 08/11/2020   CHOLHDL 3 08/11/2020  On Lipitor 20, co-Q10, Krill oil  - last eye exam was on 10/12/2020: No DR, + incipient cataract. [facility].  She sees Dr. Nicki Cordova and Dr. Katherene Cordova.  -+ Numbness in the toes  B12 was elevated in 01/2020-we increased his supplement dose from 5000 to 2500 mcg daily.   However, at last visit he was only on 15 mcg daily and I advised him to add 500 mcg daily.  He now takes 1000 mcg daily. Stopped B complex.  Reviewed his B12 level: Lab Results  Component Value Date   VITAMINB12 599 11/23/2020   VITAMINB12 >1526 (H)  01/06/2020   Thyroid nodules: - s/p 2 benign biopsies in 2014  Pt denies: - feeling nodules in neck - hoarseness - dysphagia - choking - SOB with lying down  Reviewed thyroid imaging results and biopsy results: Thyroid U/S (10/20/2016): Stable 2.8 cm solid nodule in the inferior right lobe.  This was previously sampled with negative biopsy.  The 1.8 cm nodule previously sampled in the right isthmus was no longer identified by ultrasound.  Thyroid U/S (01/15/2020): COMPARISON:  10/19/2016  FINDINGS: Parenchymal Echotexture: Mildly heterogenous Isthmus: 0.9 cm Right lobe: 6.1 x 2.6 x 1.9 cm. Left lobe: 5.4 x 2.3 x 1.3 cm _________________________________________________________  Nodule # 2: Prior  biopsy: Yes Location: Right; Inferior Maximum size: 3.7 cm; Other 2 dimensions: 3.1 x 2.8 cm, previously, 2.8 x 2.6 x 2.1 cm Composition: solid/almost completely solid (2) Echogenicity: isoechoic (1) Significant change in size (>/= 20% in two dimensions and minimal increase of 2 mm): Yes  **Given size (>/= 2.5 cm) and appearance, fine needle aspiration of this mildly suspicious nodule should be considered based on TI-RADS criteria. _______________________________________________________  There is a stable small hypoechoic nodule measuring approximately 7 mm in the right mid thyroid gland.  IMPRESSION: Interval increase in size of the dominant right-sided thyroid nodule, currently measuring 3.7 cm (previously measuring 2.8 cm). While this thyroid nodule was previously biopsied, consider repeat ultrasound or fine-needle aspiration given the nodule's significant interval growth.  Thyroid FNA (01/20/2020): FINAL MICROSCOPIC DIAGNOSIS:  - Consistent with benign follicular nodule (Bethesda category II)   SPECIMEN ADEQUACY:  Satisfactory for evaluation   Latest TSH was normal: Lab Results  Component Value Date   TSH 0.82 11/23/2020   He also has sleep-related hypoxia, HTN. He has a history of gastric banding (11/25/2013).  Weight before surgery: 259 pounds. He has Medicare with 2 supplemental insurances.  ROS: + see HPI Neurological: + numbness/no tingling/no dizziness  I reviewed pt's medications, allergies, PMH, social hx, family hx, and changes were documented in the history of present illness. Otherwise, unchanged from my initial visit note.  Past Medical History:  Diagnosis Date   ABNORMAL GLUCOSE NEC 03/18/2007   Arthritis    fingers   BLEPHARITIS, LEFT 10/29/2009   DIABETES-TYPE 2 04/18/2010   Dry throat    Environmental allergies    GERD (gastroesophageal reflux disease)    H/O hiatal hernia    HYPERLIPIDEMIA 10/22/2007   HYPERTENSION 03/18/2007   Multiple thyroid  nodules    "biopsy done, everything was fine" being monitored   NEVUS, MELANOCYTIC, FACE 10/07/2009   Past Surgical History:  Procedure Laterality Date   BIOPSY THYROID  ~08/2013   BREATH TEK H PYLORI N/A 09/05/2013   Procedure: BREATH TEK H PYLORI;  Surgeon: Robert Earls, MD;  Location: [facility];  Service: General;  Laterality: N/A;   CATARACT EXTRACTION Bilateral    COLONOSCOPY  2009, 2013   Dr. Sharlett Iles; multiple polyps   HIATAL HERNIA REPAIR  11/25/2013   Procedure: LAPAROSCOPIC REPAIR OF HIATAL HERNIA;  Surgeon: Robert Earls, MD;  Location: [facility];  Service: General;;   LAPAROSCOPIC GASTRIC BANDING N/A 11/25/2013   Procedure: LAPAROSCOPIC GASTRIC BANDING;  Surgeon: Robert Earls, MD;  Location: [facility];  Service: General;  Laterality: N/A;   NO PAST SURGERIES     Social History   Social History   Marital status: Married    Spouse name: N/A   Number of children: 2   Occupational History   Consultant-- self-employeed [employer]  Social History Main Topics  ° Smoking status: Never Smoker  ° Smokeless tobacco: Never Used  ° Alcohol use No  ° Drug use: No  ° °Current Outpatient Medications on File Prior to Visit  °Medication Sig Dispense Refill  ° aspirin 81 MG tablet Take 1 tablet (81 mg total) by mouth daily. 30 tablet 5  ° atorvastatin (LIPITOR) 20 MG tablet TAKE 1 TABLET EVERY DAY 90 tablet 2  ° Blood Glucose Calibration (TRUE METRIX LEVEL 1) Low SOLN Use as instructed 1 each 0  ° Blood Glucose Monitoring Suppl (TRUE METRIX AIR GLUCOSE METER) w/Device KIT 1 Device by Other route as directed. 1 kit 0  ° Cholecalciferol (VITAMIN D PO) Take 50 mcg by mouth daily.    ° Coenzyme Q10 (CO Q-10) 200 MG CAPS Take by mouth daily.    ° glipiZIDE (GLUCOTROL) 5 MG tablet TAKE 1 TABLET TWICE DAILY BEFORE MEALS 180 tablet 3  ° glucose blood (TRUE METRIX BLOOD GLUCOSE TEST) test strip Use to check blood sugar 2x a day. 200 each 3  ° insulin glargine (LANTUS SOLOSTAR) 100 UNIT/ML Solostar Pen  Inject 20 Units into the skin at bedtime. 30 mL 3  ° Insulin Pen Needle 32G X 4 MM MISC Use 1x a day 100 each 3  ° JARDIANCE 25 MG TABS tablet TAKE 1 TABLET EVERY DAY 90 tablet 1  ° Lidocaine 1.8 % PTCH Apply topically.    ° losartan (COZAAR) 50 MG tablet TAKE 1 TABLET EVERY DAY 90 tablet 0  ° Magnesium 250 MG TABS Take by mouth daily.     ° meloxicam (MOBIC) 7.5 MG tablet TAKE 1 TABLET BY MOUTH DAILY. TAKE WITH FOOD    ° metFORMIN (GLUCOPHAGE) 500 MG tablet TAKE 2 TABLETS TWICE DAILY WITH MEALS 360 tablet 3  ° minoxidil (ROGAINE) 2 % external solution Apply 1 application topically daily. 60 mL 10  ° Multiple Vitamins-Minerals (MULTIVITAMIN WITH MINERALS) tablet Take 1 tablet by mouth daily. (Patient not taking: Reported on 02/23/2021)    ° OXYGEN-HELIUM IN Inhale into the lungs.     ° OZEMPIC, 1 MG/DOSE, 4 MG/3ML SOPN INJECT 1 MG INTO THE SKIN ONCE A WEEK. 9 mL 1  ° TRUEplus Lancets 33G MISC Use as instructed two times daily 300 each 2  ° vitamin B-12 (CYANOCOBALAMIN) 100 MCG tablet Take 100 mcg by mouth daily.    ° °No current facility-administered medications on file prior to visit.  ° °Allergies  °Allergen Reactions  ° Ace Inhibitors   °  REACTION: Coughing  ° °Family History  °Problem Relation Age of Onset  ° Hyperlipidemia Mother   ° Hypertension Mother   ° Arthritis Mother   ° Cancer Mother   ° Hyperlipidemia Father   ° Hypertension Father   ° Heart attack Father   ° Hyperlipidemia Brother   ° Diabetes Maternal Grandmother   ° Diabetes Maternal Grandfather   ° Colon cancer Neg Hx   ° Stomach cancer Neg Hx   ° Esophageal cancer Neg Hx   ° Rectal cancer Neg Hx   ° ° °PE: °BP 130/70 (BP Location: Right Arm, Patient Position: Sitting, Cuff Size: Normal)    Pulse 84    Ht 5' 10" (1.778 m)    Wt 220 lb 3.2 oz (99.9 kg)    SpO2 98%    BMI 31.60 kg/m²  °Wt Readings from Last 3 Encounters:  °08/09/21 220 lb 3.2 oz (99.9 kg)  °03/31/21 219 lb 9.6 oz (99.6 kg)  °  11/23/20 227 lb (103 kg)  ° °Constitutional: overweight, in  NAD °Eyes: PERRLA, EOMI, no exophthalmos °ENT: moist mucous membranes, no thyromegaly but R inf thyroid nodule palpated, measuring approximately 2.5 cm, no cervical lymphadenopathy °Cardiovascular: RRR, No MRG °Respiratory: CTA B °Musculoskeletal: no deformities, strength intact in all 4 °Skin: moist, warm, no rashes °Neurological: + tremor with outstretched hands, DTR normal in all 4 ° °ASSESSMENT: °1. DM2, non-insulin-dependent, uncontrolled, without long term complications, but with hyperglycemia °- h/o DKA ° °2.  Multinodular goiter ° °3. HL ° °4.  Numbness in toes/high B12 level ° °PLAN:  °1. Patient with standing, uncontrolled, type 2 diabetes, on oral antidiabetic regimen with metformin, sulfonylurea, FLUS inhibitor and also weekly GLP-1 receptor agonist and daily insulin with still suboptimal control.  At last visit, HbA1c was lower, at 6.9%.  At that time, sugars were still high in the morning, mostly above target, as he was still having a snack at night.  We again discussed about the importance of stopping this but we also increased his Lantus dose.  He was not checking sugars later in the day and I advised him to try to do so.  We did not increase the Ozempic to the 2 mg weekly dose as this was on backorder at that time. °- at this visit, sugars are higher, as he relaxed his diet during the holidays.  Sugars in the morning vary between 90s and 160s while at night, then may increase up to 200s.  Upon questioning, he is taking a variable dose of Lantus, between 10 and 30 units depending on the sugars at bedtime.  We discussed about not wearing her Lantus by so much, since this is not usually used to correct the blood sugars at bedtime.  In the meantime, I did advise him to try to increase Ozempic to 2 mg weekly, for better postprandial control, at the doses back on the market.  He has no side effects from Ozempic. °- I suggested to:  °Patient Instructions  °Please continue: ° - Metformin 2000 mg with  dinner °- Glipizide 10 mg 30 min before dinner °- Jardiance 25 mg before b'fast °- Lantus 20-24 units at bedtime ° °Please increase: °- Ozempic 2 mg weekly ° °Please return in 4 months with your sugar log ° °- we checked his HbA1c: 7.1% (higher) °- advised to check sugars at different times of the day - 1-2x a day, rotating check times °- advised for yearly eye exams >> he is UTD °- he is due for annual labs - he would like to schedule another APE with PCP and have labs there °- return to clinic in 4 months ° °2. Thyroid nodules and goiter °-He has a history of 2 thyroid nodules of which only 1 was seen on the most recent ultrasound °-Nodules appeared stable in size on the thyroid ultrasound from 2018.  However, thyroid ultrasound from 01/2020 showed an increase in the right inferior dominant nodule size from 2.8 to 3.7 cm.  An FNA was benign. °-Latest TSH was normal: °Lab Results  °Component Value Date  ° TSH 0.82 11/23/2020  °-No neck compression symptoms °-Plan to repeat the ultrasound later at next visit ° °3. HL °-Reviewed latest lipid panel from 08/2020: Fractions at goal with the exception of a slightly low HDL: °Lab Results  °Component Value Date  ° CHOL 114 08/11/2020  ° HDL 38.20 (L) 08/11/2020  ° LDLCALC 49 08/11/2020  ° LDLDIRECT 72.0 05/07/2018  ° TRIG 137.0 08/11/2020  °   CHOLHDL 3 08/11/2020  °-He continues on Lipitor 20 mg daily, Krill oil, co-Q10, without side effects ° °4.  Elevated B12 °-We checked a B12 level in the past due to numbness in his hands and this was elevated.  We decreased his B12 supplement 5000 mcg to 2500 mcg daily.  However, he then switched to taking only 15 mcg in B complex. °-Latest B12 level was normal in 11/2020 (599).  At that time, since this was a much lower value than before, I advised him to add 1000 mcg every other day.  He did start this but stopped the B complex. ° °Cristina Gherghe, MD PhD °[facility] ° ° °

## 2021-08-12 ENCOUNTER — Ambulatory Visit (INDEPENDENT_AMBULATORY_CARE_PROVIDER_SITE_OTHER): Payer: Medicare HMO | Admitting: Family Medicine

## 2021-08-12 ENCOUNTER — Other Ambulatory Visit: Payer: Self-pay

## 2021-08-12 ENCOUNTER — Ambulatory Visit (INDEPENDENT_AMBULATORY_CARE_PROVIDER_SITE_OTHER): Payer: Medicare HMO

## 2021-08-12 VITALS — BP 126/70 | HR 75 | Temp 98.6°F | Wt 216.2 lb

## 2021-08-12 DIAGNOSIS — F320 Major depressive disorder, single episode, mild: Secondary | ICD-10-CM

## 2021-08-12 DIAGNOSIS — N401 Enlarged prostate with lower urinary tract symptoms: Secondary | ICD-10-CM | POA: Diagnosis not present

## 2021-08-12 DIAGNOSIS — I10 Essential (primary) hypertension: Secondary | ICD-10-CM

## 2021-08-12 DIAGNOSIS — R413 Other amnesia: Secondary | ICD-10-CM | POA: Diagnosis not present

## 2021-08-12 DIAGNOSIS — N3943 Post-void dribbling: Secondary | ICD-10-CM

## 2021-08-12 DIAGNOSIS — M25562 Pain in left knee: Secondary | ICD-10-CM | POA: Diagnosis not present

## 2021-08-12 DIAGNOSIS — G4734 Idiopathic sleep related nonobstructive alveolar hypoventilation: Secondary | ICD-10-CM | POA: Diagnosis not present

## 2021-08-12 DIAGNOSIS — R2689 Other abnormalities of gait and mobility: Secondary | ICD-10-CM | POA: Diagnosis not present

## 2021-08-12 DIAGNOSIS — E1169 Type 2 diabetes mellitus with other specified complication: Secondary | ICD-10-CM

## 2021-08-12 DIAGNOSIS — Z0000 Encounter for general adult medical examination without abnormal findings: Secondary | ICD-10-CM | POA: Diagnosis not present

## 2021-08-12 DIAGNOSIS — R682 Dry mouth, unspecified: Secondary | ICD-10-CM

## 2021-08-12 LAB — CBC WITH DIFFERENTIAL/PLATELET
Basophils Absolute: 0.1 10*3/uL (ref 0.0–0.1)
Basophils Relative: 0.8 % (ref 0.0–3.0)
Eosinophils Absolute: 0.1 10*3/uL (ref 0.0–0.7)
Eosinophils Relative: 1.6 % (ref 0.0–5.0)
HCT: 44.4 % (ref 39.0–52.0)
Hemoglobin: 14.9 g/dL (ref 13.0–17.0)
Lymphocytes Relative: 22.8 % (ref 12.0–46.0)
Lymphs Abs: 1.8 10*3/uL (ref 0.7–4.0)
MCHC: 33.6 g/dL (ref 30.0–36.0)
MCV: 92.8 fl (ref 78.0–100.0)
Monocytes Absolute: 0.7 10*3/uL (ref 0.1–1.0)
Monocytes Relative: 9 % (ref 3.0–12.0)
Neutro Abs: 5.2 10*3/uL (ref 1.4–7.7)
Neutrophils Relative %: 65.8 % (ref 43.0–77.0)
Platelets: 326 10*3/uL (ref 150.0–400.0)
RBC: 4.79 Mil/uL (ref 4.22–5.81)
RDW: 13.2 % (ref 11.5–15.5)
WBC: 7.9 10*3/uL (ref 4.0–10.5)

## 2021-08-12 LAB — LIPID PANEL
Cholesterol: 132 mg/dL (ref 0–200)
HDL: 39.1 mg/dL (ref >39.00)
LDL Cholesterol: 60 mg/dL (ref 0–99)
NonHDL: 93.31
Total CHOL/HDL Ratio: 3
Triglycerides: 165 mg/dL — ABNORMAL HIGH (ref 0.0–149.0)
VLDL: 33 mg/dL (ref 0.0–40.0)

## 2021-08-12 LAB — BASIC METABOLIC PANEL
BUN: 18 mg/dL (ref 6–23)
CO2: 22 mEq/L (ref 19–32)
Calcium: 10.1 mg/dL (ref 8.4–10.5)
Chloride: 106 mEq/L (ref 96–112)
Creatinine, Ser: 0.72 mg/dL (ref 0.40–1.50)
GFR: 91.49 mL/min (ref >60.00)
Glucose, Bld: 91 mg/dL (ref 70–99)
Potassium: 4.4 mEq/L (ref 3.5–5.1)
Sodium: 140 mEq/L (ref 135–145)

## 2021-08-12 LAB — T4, FREE: Free T4: 0.92 ng/dL (ref 0.60–1.60)

## 2021-08-12 LAB — FOLATE: Folate: >24.2 ng/mL (ref >5.9)

## 2021-08-12 LAB — VITAMIN B12: Vitamin B-12: 824 pg/mL (ref 211–911)

## 2021-08-12 LAB — TSH: TSH: 0.39 u[IU]/mL (ref 0.35–5.50)

## 2021-08-12 LAB — HEMOGLOBIN A1C: Hgb A1c MFr Bld: 7.3 % — ABNORMAL HIGH (ref 4.6–6.5)

## 2021-08-12 LAB — VITAMIN D 25 HYDROXY (VIT D DEFICIENCY, FRACTURES): VITD: 39 ng/mL (ref 30.00–100.00)

## 2021-08-12 LAB — PSA: PSA: 3.88 ng/mL (ref 0.10–4.00)

## 2021-08-12 NOTE — Patient Instructions (Signed)
Several referrals were placed for you this visit including 1 to psychology for neuropsych testing, urology to further discuss BPH, and 1 for a repeat sleep study.  Dennison Bulla, [facility] is a counselor here in clinic.  His number slightly different from the clinic number, [phone].  You can contact this number in order to schedule an appointment.  No referral is needed for him or other behavioral health providers.  Is recommended that you follow-up sooner than yearly as it is difficult to address all of your concerns in 1 visit.  Feel free to schedule an appointment in the next few weeks to further discuss any issues that we were unable to fully address this visit.

## 2021-08-12 NOTE — Progress Notes (Signed)
Subjective:     Robert Cordova is a 71 y.o. male and is here for a comprehensive physical exam. The patient reports decreased focus, distraction, dry mouth at night, slight change in memory, feeling off balance at times.  Inquires about counseling.  Will sleep with a cough gtt in mouth to help with dry mouth.   States having left knee pain, increasing in frequency times a while.  Does not recall injury, no edema, clicks, pops, or tears.  Has a h/o BPH, notes urinary dribbling.  Inquires about urology referral.  Social History   Socioeconomic History   Marital status: Married    Spouse name: Robert Cordova   Number of children: 2   Years of education: 16   Highest education level: Bachelor's degree (e.g., BA, AB, BS)  Occupational History   Occupation: Optometrist-- self-employeed  Tobacco Use   Smoking status: Never    Passive exposure: Past   Smokeless tobacco: Never  Vaping Use   Vaping Use: Never used  Substance and Sexual Activity   Alcohol use: No   Drug use: No   Sexual activity: Not Currently    Partners: Female  Other Topics Concern   Not on file  Social History Narrative   Not on file   Social Determinants of Health   Financial Resource Strain: Low Risk    Difficulty of Paying Living Expenses: Not hard at all  Food Insecurity: No Food Insecurity   Worried About Charity fundraiser in the Last Year: Never true   [place] in the Last Year: Never true  Transportation Needs: No Transportation Needs   Lack of Transportation (Medical): No   Lack of Transportation (Non-Medical): No  Physical Activity: Inactive   Days of Exercise per Week: 0 days   Minutes of Exercise per Session: 0 min  Stress: Stress Concern Present   Feeling of Stress : Rather much  Social Connections: Moderately Isolated   Frequency of Communication with Friends and Family: More than three times a week   Frequency of Social Gatherings with Friends and Family: More than three times  a week   Attends Religious Services: Never   Marine scientist or Organizations: No   Attends Music therapist: Never   Marital Status: Married  Human resources officer Violence: Not At Risk   Fear of Current or Ex-Partner: No   Emotionally Abused: No   Physically Abused: No   Sexually Abused: No   Health Maintenance  Topic Date Due   Zoster Vaccines- Shingrix (1 of 2) Never done   FOOT EXAM  11/15/2016   COLONOSCOPY (Pts 45-21yrs Insurance coverage will need to be confirmed)  09/02/2017   COVID-19 Vaccine (6 - Booster for Pfizer series) 09/02/2021   OPHTHALMOLOGY EXAM  10/12/2021   HEMOGLOBIN A1C  02/06/2022   TETANUS/TDAP  05/07/2024   Pneumonia Vaccine 66+ Years old  Completed   INFLUENZA VACCINE  Completed   Hepatitis C Screening  Completed   HPV VACCINES  Aged Out    The following portions of the patient's history were reviewed and updated as appropriate: allergies, current medications, past family history, past medical history, past social history, past surgical history, and problem list.  Review of Systems Pertinent items noted in HPI and remainder of comprehensive ROS otherwise negative.   Objective:    BP 126/70 (BP Location: Left Arm, Patient Position: Sitting, Cuff Size: Large)    Pulse 75    Temp 98.6 F (37 C) (Oral)  Wt 216 lb 3.2 oz (98.1 kg)    SpO2 96%    BMI 31.02 kg/m  General appearance: alert, cooperative, and no distress Head: Normocephalic, without obvious abnormality, atraumatic Eyes: conjunctivae/corneas clear. PERRL, EOM's intact. Fundi benign. Ears: normal TM's and external ear canals both ears Nose: Nares normal. Septum midline. Mucosa normal. No drainage or sinus tenderness. Throat: lips, mucosa, and tongue normal; teeth and gums normal Neck: no adenopathy, no carotid bruit, no JVD, supple, symmetrical, trachea midline, and thyroid not enlarged, symmetric, no tenderness/mass/nodules Lungs: clear to auscultation bilaterally Heart:  regular rate and rhythm, S1, S2 normal, no murmur, click, rub or gallop Abdomen: soft, non-tender; bowel sounds normal; no masses,  no organomegaly Extremities: extremities normal, atraumatic, no cyanosis or edema Pulses: 2+ and symmetric Skin: Skin color, texture, turgor normal. No rashes or lesions Lymph nodes: Cervical, supraclavicular, and axillary nodes normal. Neurologic: Alert and oriented X 3, normal strength and tone. Normal symmetric reflexes. Normal coordination and gait    Depression screen [facility] 2/9 08/12/2021 03/03/2021 02/23/2021  Decreased Interest 1 0 0  Down, Depressed, Hopeless 1 1 1   PHQ - 2 Score 2 1 1   Altered sleeping 1 - -  Tired, decreased energy 1 - -  Change in appetite 1 - -  Feeling bad or failure about yourself  1 - -  Trouble concentrating 1 - -  Moving slowly or fidgety/restless 2 - -  Suicidal thoughts 0 - -  PHQ-9 Score 9 - -  Difficult doing work/chores - - -    Assessment:    Healthy male exam.      Plan:    Anticipatory guidance given including wearing seatbelts, smoke detectors in the home, increasing physical activity, increasing p.o. intake of water and vegetables. -labs -Colonoscopy done 09/02/2012.  Due in 10 years. -Immunizations reviewed -Next CPE in 1 year -Given handout See After Visit Summary for Counseling Recommendations   Essential hypertension -Controlled -Continue current medications: Losartan 50 mg daily -History of allergy to ACE I - Plan: Basic metabolic panel, Lipid panel  Type 2 diabetes mellitus with other specified complication, without long-term current use of insulin (HCC)  -Controlled -Continue current medications including semaglutide 2 mg weekly, metformin 1000 mg twice daily, Jardiance 25 mg daily, glipizide 5 mg twice daily, Lantus 20-40 mg nightly -Continue ARB and statin -Continue follow-up with endocrinology - Plan: Hemoglobin A1c  Balance problem  Depression, major, single episode, mild (HCC) -PHQ-9  score of 9 this visit -Discussed counseling options.  Given information for area [facility] providers and encouraged to schedule appointment - Plan: TSH, T4, Free, Vitamin B12, Vitamin D, 25-hydroxy  Left knee pain, unspecified chronicity  -Possible causes including OA -Continue supportive measures including compression, heat, ice, topical analgesics such as Voltaren gel, Biofreeze, etc. -Based on imaging consider PT and/or Ortho referral. - Plan: DG Knee Complete 4 Views Left  Benign prostatic hyperplasia with post-void dribbling  - Plan: PSA, Ambulatory referral to Urology  Nocturnal hypoxemia  - Plan: CBC with Differential/Platelet, Ambulatory referral to Sleep Studies  Memory change  - Plan: CBC with Differential/Platelet, TSH, T4, Free, Vitamin B12, Vitamin D, 25-hydroxy, Ambulatory referral to Psychology, Folate  Dry mouth -Discussed possible causes including medications, having mouth open while sleeping, etc  F/u prn in 1-2 months  Grier Mitts, MD

## 2021-08-18 ENCOUNTER — Encounter: Payer: Self-pay | Admitting: Family Medicine

## 2021-08-18 DIAGNOSIS — N401 Enlarged prostate with lower urinary tract symptoms: Secondary | ICD-10-CM | POA: Diagnosis not present

## 2021-08-18 DIAGNOSIS — R350 Frequency of micturition: Secondary | ICD-10-CM | POA: Diagnosis not present

## 2021-08-18 DIAGNOSIS — R3915 Urgency of urination: Secondary | ICD-10-CM | POA: Diagnosis not present

## 2021-09-03 ENCOUNTER — Other Ambulatory Visit: Payer: Self-pay | Admitting: Internal Medicine

## 2021-09-03 DIAGNOSIS — E1165 Type 2 diabetes mellitus with hyperglycemia: Secondary | ICD-10-CM

## 2021-09-07 DIAGNOSIS — J9610 Chronic respiratory failure, unspecified whether with hypoxia or hypercapnia: Secondary | ICD-10-CM | POA: Diagnosis not present

## 2021-10-05 DIAGNOSIS — J9610 Chronic respiratory failure, unspecified whether with hypoxia or hypercapnia: Secondary | ICD-10-CM | POA: Diagnosis not present

## 2021-11-05 DIAGNOSIS — J9610 Chronic respiratory failure, unspecified whether with hypoxia or hypercapnia: Secondary | ICD-10-CM | POA: Diagnosis not present

## 2021-11-23 ENCOUNTER — Other Ambulatory Visit: Payer: Self-pay | Admitting: Family Medicine

## 2021-11-23 DIAGNOSIS — I10 Essential (primary) hypertension: Secondary | ICD-10-CM

## 2021-12-05 DIAGNOSIS — J9610 Chronic respiratory failure, unspecified whether with hypoxia or hypercapnia: Secondary | ICD-10-CM | POA: Diagnosis not present

## 2021-12-13 ENCOUNTER — Ambulatory Visit: Payer: Medicare HMO | Admitting: Internal Medicine

## 2021-12-13 ENCOUNTER — Encounter: Payer: Self-pay | Admitting: Internal Medicine

## 2021-12-13 VITALS — BP 120/78 | HR 68 | Ht 70.0 in | Wt 215.8 lb

## 2021-12-13 DIAGNOSIS — E042 Nontoxic multinodular goiter: Secondary | ICD-10-CM

## 2021-12-13 DIAGNOSIS — E1165 Type 2 diabetes mellitus with hyperglycemia: Secondary | ICD-10-CM

## 2021-12-13 DIAGNOSIS — R200 Anesthesia of skin: Secondary | ICD-10-CM

## 2021-12-13 DIAGNOSIS — E782 Mixed hyperlipidemia: Secondary | ICD-10-CM | POA: Diagnosis not present

## 2021-12-13 LAB — POCT GLYCOSYLATED HEMOGLOBIN (HGB A1C): Hemoglobin A1C: 6.4 % — AB (ref 4.0–5.6)

## 2021-12-13 NOTE — Patient Instructions (Addendum)
Please use: ? - Metformin 2000 mg with dinner ?- Glipizide  5-10 mg 30 min before dinner (depending on the size of the meal) ?- Jardiance 25 mg before b'fast ?- Lantus 25 units at bedtime ?- Ozempic 2 mg weekly ? ?Please return in 4 months with your sugar log ?

## 2021-12-13 NOTE — Progress Notes (Addendum)
Patient ID: Robert Cordova, male   DOB: March 12, 1951, 71 y.o.   MRN: [id number]  ? ?This visit occurred during the SARS-CoV-2 public health emergency.  Safety protocols were in place, including screening questions prior to the visit, additional usage of staff PPE, and extensive cleaning of exam room while observing appropriate contact time as indicated for disinfecting solutions.  ? ?HPI: ?Robert Cordova is a 71 y.o.-year-old male, returning for f/u for DM2, dx in 2013, insulin-dependent since 04/2020, uncontrolled, with complications (h/o DKA). Last visit 4 months ago. ? ?Interim history: ?He denies increased urination, blurry vision, nausea, chest pain. ? ?Reviewed HbA1c levels: ?Lab Results  ?Component Value Date  ? HGBA1C 7.3 (H) 08/12/2021  ? HGBA1C 7.1 (A) 08/09/2021  ? HGBA1C 6.9 (A) 03/31/2021  ?08/22/2016: 7.7% ? ?He is on: ?- Metformin 1000 mg 2x a day, with meals >> 2000 mg with dinner ?- Glipizide 10 mg after dinner >> 10 mg 30 minutes before dinner  ?- Jardiance 25 mg before b'fast ?- Ozempic 0.5 mg weekly-added 09/2018 >> 1 >> 2 mg weekly ?- Lantus -started 04/2020 >> .Marland KitchenMarland Kitchen20-24 >> 24 >> 25 units at bedtime ?Of note, He was off and on insulin in the past. He came off when he lost weight after his lab band surgery in 2014 (45 pounds) ? ?He is checking sugars 1-2 times a day: ?- am: 136-201 >> 108, 130-173, 192 >> 91-163 >> 75-128, 137 ?- 2h after b'fast: 87-125, 147 >> n/c >> 181, 215 >> n/c ?- before lunch: 137, 141-200 >> 99-122 >> 148 >> n/c >> 148 >> n/c ?- 2h after lunch: n/c >> 118-215 >> n/c >> 121 >> n/c ?- before dinner:  119 >> 108-134 >> 94, 138, 150 >> n/c  ?- 2h after dinner:328 (cheese cake) >> n/c >> 208 >> n/c ?- bedtime:   178-232 >> 146-202 >> 115-211, 228, 282 >> 64, 79-190, 226 ?- nighttime: n/c >> 212 >> n/c  ?Lowest sugar was 114 >> 94 >> 96 >> 70 >> 90s >> 64 after working in the yard; he has hypoglycemia awareness in the 70s. ?Highest sugar was 221 >> 221 >> 199 >> 202 >>  282 >> 226 x1. ? ?Pt's meals are: ?- Breakfast: Protein shake ?- Lunch (12-1): Eggs, bacon or leftovers ?- Dinner: Meat + veggies + starch, pizza, [place] food ?- Snacks: In the evening: cookies, etc. ? ?-No CKD, last BUN/creatinine:  ?Lab Results  ?Component Value Date  ? BUN 18 08/12/2021  ? BUN 17 08/11/2020  ? CREATININE 0.72 08/12/2021  ? CREATININE 0.75 08/11/2020  ?On losartan 25 mg twice a day. ? ?-+ HL; last set of lipids: ?Lab Results  ?Component Value Date  ? CHOL 132 08/12/2021  ? HDL 39.10 08/12/2021  ? [place] 60 08/12/2021  ? LDLDIRECT 72.0 05/07/2018  ? TRIG 165.0 (H) 08/12/2021  ? CHOLHDL 3 08/12/2021  ?On Lipitor 20, co-Q10, Krill oil ? ?- last eye exam was on 10/12/2020: No DR, + incipient cataract. [facility].  She sees Dr. Nicki Reaper and Dr. Katherene Ponto. ? ?-+ Numbness in the toes.  ? ?B12 was elevated in 01/2020-we increased his supplement dose from 5000 to 2500 mcg daily.   ?However, at last visit he was only on 15 mcg daily and I advised him to add 500 mcg daily.  ?He now takes 1000 mcg qod. Stopped B complex. ? ?Reviewed his B12 level: ?Lab Results  ?Component Value Date  ? ZDGUYQIH47 824 08/12/2021  ? QQVZDGLO75 599 11/23/2020  ?  PFYTWKMQ28 >1526 (H) 01/06/2020  ? ?Thyroid nodules: ?- s/p 2 benign biopsies in 2014 ? ?Pt denies: ?- feeling nodules in neck ?- hoarseness ?- dysphagia ?- choking ?- SOB with lying down ? ?Reviewed thyroid imaging results and biopsy results: ?Thyroid U/S (10/20/2016): Stable 2.8 cm solid nodule in the inferior right lobe.  This was previously sampled with negative biopsy.  The 1.8 cm nodule previously sampled in the right isthmus was no longer identified by ultrasound. ? ?Thyroid U/S (01/15/2020): ?COMPARISON:  10/19/2016 ? ?FINDINGS: ?Parenchymal Echotexture: Mildly heterogenous ?Isthmus: 0.9 cm ?Right lobe: 6.1 x 2.6 x 1.9 cm. ?Left lobe: 5.4 x 2.3 x 1.3 cm ?_________________________________________________________ ? ?Nodule # 2: ?Prior biopsy: Yes ?Location:  Right; Inferior ?Maximum size: 3.7 cm; Other 2 dimensions: 3.1 x 2.8 cm, previously, 2.8 x 2.6 x 2.1 cm ?Composition: solid/almost completely solid (2) ?Echogenicity: isoechoic (1) ?Significant change in size (>/= 20% in two dimensions and minimal increase of 2 mm): Yes ? ?**Given size (>/= 2.5 cm) and appearance, fine needle aspiration of ?this mildly suspicious nodule should be considered based on TI-RADS ?criteria. ?_______________________________________________________ ? ?There is a stable small hypoechoic nodule measuring approximately 7 ?mm in the right mid thyroid gland. ? ?IMPRESSION: ?Interval increase in size of the dominant right-sided thyroid ?nodule, currently measuring 3.7 cm (previously measuring 2.8 cm). ?While this thyroid nodule was previously biopsied, consider repeat ?ultrasound or fine-needle aspiration given the nodule's significant ?interval growth. ? ?Thyroid FNA (01/20/2020): ?FINAL MICROSCOPIC DIAGNOSIS:  ?- Consistent with benign follicular nodule (Bethesda category II)  ? ?SPECIMEN ADEQUACY:  ?Satisfactory for evaluation  ? ?Latest TSH was normal: ?Lab Results  ?Component Value Date  ? TSH 0.39 08/12/2021  ? ?He also has sleep-related hypoxia, HTN. ?He has a history of gastric banding (11/25/2013).  Weight before surgery: 259 pounds. ?He has Medicare with 2 supplemental insurances. ? ?ROS: ?+ see HPI ?Neurological: + numbness/no tingling/no dizziness ? ?I reviewed pt's medications, allergies, PMH, social hx, family hx, and changes were documented in the history of present illness. Otherwise, unchanged from my initial visit note. ? ?Past Medical History:  ?Diagnosis Date  ? ABNORMAL GLUCOSE NEC 03/18/2007  ? Arthritis   ? fingers  ? BLEPHARITIS, LEFT 10/29/2009  ? DIABETES-TYPE 2 04/18/2010  ? Dry throat   ? Environmental allergies   ? GERD (gastroesophageal reflux disease)   ? H/O hiatal hernia   ? HYPERLIPIDEMIA 10/22/2007  ? HYPERTENSION 03/18/2007  ? Multiple thyroid nodules   ? "biopsy  done, everything was fine" being monitored  ? NEVUS, MELANOCYTIC, FACE 10/07/2009  ? ?Past Surgical History:  ?Procedure Laterality Date  ? BIOPSY THYROID  ~08/2013  ? BREATH TEK H PYLORI N/A 09/05/2013  ? Procedure: BREATH TEK H PYLORI;  Surgeon: [name], MD;  Location: [facility];  Service: General;  Laterality: N/A;  ? CATARACT EXTRACTION Bilateral   ? COLONOSCOPY  2009, 2013  ? Dr. Sharlett Iles; multiple polyps  ? HIATAL HERNIA REPAIR  11/25/2013  ? Procedure: LAPAROSCOPIC REPAIR OF HIATAL HERNIA;  Surgeon: [name], MD;  Location: [facility];  Service: General;;  ? LAPAROSCOPIC GASTRIC BANDING N/A 11/25/2013  ? Procedure: LAPAROSCOPIC GASTRIC BANDING;  Surgeon: [name], MD;  Location: [facility];  Service: General;  Laterality: N/A;  ? NO PAST SURGERIES    ? ?Social History  ? ?Social History  ? Marital status: Married  ?  Spouse name: N/A  ? Number of children: 2  ? ?Occupational History  ? Consultant-- self-employeed  [employer]   ? ?Social History Main Topics  ? Smoking status: Never Smoker  ? Smokeless tobacco: Never Used  ? Alcohol use No  ? Drug use: No  ? ?Current Outpatient Medications on File Prior to Visit  ?Medication Sig Dispense Refill  ? aspirin 81 MG tablet Take 1 tablet (81 mg total) by mouth daily. 30 tablet 5  ? atorvastatin (LIPITOR) 20 MG tablet TAKE 1 TABLET EVERY DAY 90 tablet 2  ? Blood Glucose Calibration (TRUE METRIX LEVEL 1) Low SOLN Use as instructed 1 each 0  ? Blood Glucose Monitoring Suppl (TRUE METRIX AIR GLUCOSE METER) w/Device KIT 1 Device by Other route as directed. 1 kit 0  ? Cholecalciferol (VITAMIN D PO) Take 50 mcg by mouth daily.    ? Coenzyme Q10 (CO Q-10) 200 MG CAPS Take by mouth daily.    ? empagliflozin (JARDIANCE) 25 MG TABS tablet Take 1 tablet (25 mg total) by mouth daily. 90 tablet 3  ? glipiZIDE (GLUCOTROL) 5 MG tablet TAKE 1 TABLET TWICE DAILY BEFORE MEALS 180 tablet 3  ? glucose blood (TRUE METRIX BLOOD GLUCOSE TEST) test strip TEST BLOOD SUGAR TWICE  DAILY 200 strip 3  ? insulin glargine (LANTUS SOLOSTAR) 100 UNIT/ML Solostar Pen Inject 20-24 Units into the skin at bedtime. 30 mL 3  ? Insulin Pen Needle 32G X 4 MM MISC Use 1x a day 100 each 3  ? Lidocaine

## 2021-12-16 ENCOUNTER — Ambulatory Visit
Admission: RE | Admit: 2021-12-16 | Discharge: 2021-12-16 | Disposition: A | Discharge status: Home / Self Care | Payer: Medicare HMO | Source: Ambulatory Visit | Attending: Internal Medicine | Admitting: Internal Medicine

## 2021-12-16 DIAGNOSIS — E042 Nontoxic multinodular goiter: Secondary | ICD-10-CM | POA: Diagnosis not present

## 2022-01-05 DIAGNOSIS — J9610 Chronic respiratory failure, unspecified whether with hypoxia or hypercapnia: Secondary | ICD-10-CM | POA: Diagnosis not present

## 2022-01-10 ENCOUNTER — Other Ambulatory Visit: Payer: Self-pay | Admitting: Internal Medicine

## 2022-01-10 DIAGNOSIS — E1165 Type 2 diabetes mellitus with hyperglycemia: Secondary | ICD-10-CM

## 2022-02-04 DIAGNOSIS — J9610 Chronic respiratory failure, unspecified whether with hypoxia or hypercapnia: Secondary | ICD-10-CM | POA: Diagnosis not present

## 2022-02-14 DIAGNOSIS — Z125 Encounter for screening for malignant neoplasm of prostate: Secondary | ICD-10-CM | POA: Diagnosis not present

## 2022-02-21 DIAGNOSIS — N401 Enlarged prostate with lower urinary tract symptoms: Secondary | ICD-10-CM | POA: Diagnosis not present

## 2022-02-21 DIAGNOSIS — R350 Frequency of micturition: Secondary | ICD-10-CM | POA: Diagnosis not present

## 2022-02-21 DIAGNOSIS — R3915 Urgency of urination: Secondary | ICD-10-CM | POA: Diagnosis not present

## 2022-02-28 ENCOUNTER — Encounter: Payer: Self-pay | Admitting: Pulmonary Disease

## 2022-02-28 ENCOUNTER — Ambulatory Visit: Payer: Medicare HMO | Admitting: Pulmonary Disease

## 2022-02-28 VITALS — BP 140/78 | HR 58 | Temp 98.1°F | Ht 70.0 in | Wt 216.0 lb

## 2022-02-28 DIAGNOSIS — G4734 Idiopathic sleep related nonobstructive alveolar hypoventilation: Secondary | ICD-10-CM

## 2022-02-28 NOTE — Progress Notes (Signed)
Synopsis: Referred in July 2023 for Nocturnal Hypoxemia by Grier Mitts, MD  Subjective:   PATIENT ID: Robert Cordova GENDER: male DOB: March 28, 1951, MRN: [id number]   HPI  Chief Complaint  Patient presents with   Consult    Consult: Uses 2.5 ML oxygen at night, has questions why he needs the oxygen. And needs a new script for equipment.   Robert Cordova is a 71 year old male, never smoker with DMII, GERD, hyperlipidemia, hypertension and sleep related hypoxemia.   He was last seen by Dr. Halford Chessman in 2016. He had PFT and CXR at that time which were unremarkable. He has remained on xx L of O2. His insurance company is changing DME companies and needs re-certified for nocturnal oxygen.   He had sleep study in 2014 which showed AHI of 3.3/hr. He was started on supplemental oxygen at that time and noticed improvement in his daytime fatigue and sleepiness. He complains of dry mouth/throat in the morning times. He uses a lozenge nightly to help with dry mouth symptoms.   He has exertional shortness of breath which has been stable for years. He denies wheezing or cough.   He is a never smoker. He is retired. 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 He denies hobbies with harmful dust or chemical exposure. He enjoys gardening and yard work.  Past Medical History:  Diagnosis Date   ABNORMAL GLUCOSE NEC 03/18/2007   Arthritis    fingers   BLEPHARITIS, LEFT 10/29/2009   DIABETES-TYPE 2 04/18/2010   Dry throat    Environmental allergies    GERD (gastroesophageal reflux disease)    H/O hiatal hernia    HYPERLIPIDEMIA 10/22/2007   HYPERTENSION 03/18/2007   Multiple thyroid nodules    "biopsy done, everything was fine" being monitored   NEVUS, MELANOCYTIC, FACE 10/07/2009     Family History  Problem Relation Age of Onset   Hyperlipidemia Mother    Hypertension Mother    Arthritis Mother    Cancer Mother    Hyperlipidemia  Father    Hypertension Father    Heart attack Father    Hyperlipidemia Brother    Diabetes Maternal Grandmother    Diabetes Maternal Grandfather    Colon cancer Neg Hx    Stomach cancer Neg Hx    Esophageal cancer Neg Hx    Rectal cancer Neg Hx      Social History   Socioeconomic History   Marital status: Married    Spouse name: Lennyn Bellanca   Number of children: 2   Years of education: 16   Highest education level: Bachelor's degree (e.g., BA, AB, BS)  Occupational History   Occupation: Optometrist-- self-employeed  Tobacco Use   Smoking status: Never    Passive exposure: Past   Smokeless tobacco: Never  Vaping Use   Vaping Use: Never used  Substance and Sexual Activity   Alcohol use: No   Drug use: No   Sexual activity: Not Currently    Partners: Female  Other Topics Concern   Not on file  Social History Narrative   Not on file   Social Determinants of Health   Financial Resource Strain: Low Risk  (03/03/2021)   Overall Financial Resource Strain (CARDIA)    Difficulty of Paying Living Expenses: Not hard at all  Food Insecurity: No Food Insecurity (03/03/2021)   Hunger Vital Sign    Worried About Running Out of Food in the Last Year:  Never true    Ran Out of Food in the Last Year: Never true  Transportation Needs: No Transportation Needs (03/03/2021)   PRAPARE - Hydrologist (Medical): No    Lack of Transportation (Non-Medical): No  Physical Activity: Inactive (03/03/2021)   Exercise Vital Sign    Days of Exercise per Week: 0 days    Minutes of Exercise per Session: 0 min  Stress: Stress Concern Present (03/03/2021)   [place]    Feeling of Stress : Rather much  Social Connections: Moderately Isolated (03/03/2021)   Social Connection and Isolation Panel [NHANES]    Frequency of Communication with Friends and Family: More than three times a week    Frequency of  Social Gatherings with Friends and Family: More than three times a week    Attends Religious Services: Never    Marine scientist or Organizations: No    Attends Archivist Meetings: Never    Marital Status: Married  Human resources officer Violence: Not At Risk (03/03/2021)   Humiliation, Afraid, Rape, and Kick questionnaire    Fear of Current or Ex-Partner: No    Emotionally Abused: No    Physically Abused: No    Sexually Abused: No     Allergies  Allergen Reactions   Ace Inhibitors     REACTION: Coughing     Outpatient Medications Prior to Visit  Medication Sig Dispense Refill   OXYGEN-HELIUM IN Inhale into the lungs.      aspirin 81 MG tablet Take 1 tablet (81 mg total) by mouth daily. 30 tablet 5   atorvastatin (LIPITOR) 20 MG tablet TAKE 1 TABLET EVERY DAY 90 tablet 2   Blood Glucose Calibration (TRUE METRIX LEVEL 1) Low SOLN Use as instructed 1 each 0   Blood Glucose Monitoring Suppl (TRUE METRIX AIR GLUCOSE METER) w/Device KIT 1 Device by Other route as directed. 1 kit 0   Cholecalciferol (VITAMIN D PO) Take 50 mcg by mouth daily.     Coenzyme Q10 (CO Q-10) 200 MG CAPS Take by mouth daily.     DROPLET PEN NEEDLES 32G X 4 MM MISC USE TO INJECT ONE TIME DAILY 100 each 3   empagliflozin (JARDIANCE) 25 MG TABS tablet Take 1 tablet (25 mg total) by mouth daily. 90 tablet 3   glipiZIDE (GLUCOTROL) 5 MG tablet TAKE 1 TABLET TWICE DAILY BEFORE MEALS 180 tablet 3   glucose blood (TRUE METRIX BLOOD GLUCOSE TEST) test strip TEST BLOOD SUGAR TWICE DAILY 200 strip 3   insulin glargine (LANTUS SOLOSTAR) 100 UNIT/ML Solostar Pen Inject 20-24 Units into the skin at bedtime. 30 mL 3   Lidocaine 1.8 % PTCH Apply topically.     losartan (COZAAR) 50 MG tablet TAKE 1 TABLET EVERY DAY 90 tablet 1   Magnesium 250 MG TABS Take by mouth daily.      meloxicam (MOBIC) 7.5 MG tablet TAKE 1 TABLET BY MOUTH DAILY. TAKE WITH FOOD     metFORMIN (GLUCOPHAGE) 500 MG tablet TAKE 2 TABLETS TWICE  DAILY WITH MEALS 360 tablet 3   minoxidil (ROGAINE) 2 % external solution Apply 1 application topically daily. 60 mL 10   Multiple Vitamins-Minerals (MULTIVITAMIN WITH MINERALS) tablet Take 1 tablet by mouth daily.     Semaglutide, 2 MG/DOSE, 8 MG/3ML SOPN Inject 2 mg as directed once a week. 9 mL 3   triamcinolone cream (KENALOG) 0.1 % SMARTSIG:1 Sparingly Topical Daily PRN  TRUEplus Lancets 33G MISC Use as instructed two times daily 300 each 2   vitamin B-12 (CYANOCOBALAMIN) 100 MCG tablet Take 100 mcg by mouth daily.     No facility-administered medications prior to visit.   Review of Systems  Constitutional:  Negative for chills, fever, malaise/fatigue and weight loss.  HENT:  Negative for congestion, sinus pain and sore throat.   Eyes: Negative.   Respiratory:  Positive for shortness of breath (with exertion). Negative for cough, hemoptysis, sputum production and wheezing.   Cardiovascular:  Negative for chest pain, palpitations, orthopnea, claudication and leg swelling.  Gastrointestinal:  Negative for abdominal pain, heartburn, nausea and vomiting.  Genitourinary: Negative.   Musculoskeletal:  Negative for joint pain and myalgias.  Skin:  Negative for rash.  Neurological:  Negative for weakness.  Endo/Heme/Allergies: Negative.   Psychiatric/Behavioral: Negative.     Objective:   Vitals:   02/28/22 0930  BP: 140/78  Pulse: (!) 58  Temp: 98.1 F (36.7 C)  TempSrc: Oral  SpO2: 95%  Weight: 216 lb (98 kg)  Height: '5\' 10"'  (1.778 m)     Physical Exam Constitutional:      General: He is not in acute distress. HENT:     Head: Normocephalic and atraumatic.  Eyes:     Extraocular Movements: Extraocular movements intact.     Conjunctiva/sclera: Conjunctivae normal.     Pupils: Pupils are equal, round, and reactive to light.  Cardiovascular:     Rate and Rhythm: Normal rate and regular rhythm.     Pulses: Normal pulses.     Heart sounds: Normal heart sounds. No murmur  heard. Abdominal:     General: Bowel sounds are normal.     Palpations: Abdomen is soft.  Musculoskeletal:     Right lower leg: No edema.     Left lower leg: No edema.  Lymphadenopathy:     Cervical: No cervical adenopathy.  Skin:    General: Skin is warm and dry.  Neurological:     General: No focal deficit present.     Mental Status: He is alert.  Psychiatric:        Mood and Affect: Mood normal.        Behavior: Behavior normal.        Thought Content: Thought content normal.        Judgment: Judgment normal.    CBC    Component Value Date/Time   WBC 7.9 08/12/2021 1126   RBC 4.79 08/12/2021 1126   HGB 14.9 08/12/2021 1126   HCT 44.4 08/12/2021 1126   PLT 326.0 08/12/2021 1126   MCV 92.8 08/12/2021 1126   MCH 30.6 11/26/2013 0428   MCHC 33.6 08/12/2021 1126   RDW 13.2 08/12/2021 1126   LYMPHSABS 1.8 08/12/2021 1126   MONOABS 0.7 08/12/2021 1126   EOSABS 0.1 08/12/2021 1126   BASOSABS 0.1 08/12/2021 1126      Latest Ref Rng & Units 08/12/2021   11:26 AM 08/11/2020    9:50 AM 07/23/2019    9:49 AM  BMP  Glucose 70 - 99 mg/dL 91  113  122   BUN 6 - 23 mg/dL '18  17  14   ' Creatinine 0.40 - 1.50 mg/dL 0.72  0.75  0.65   Sodium 135 - 145 mEq/L 140  141  138   Potassium 3.5 - 5.1 mEq/L 4.4  4.7  4.3   Chloride 96 - 112 mEq/L 106  105  103   CO2 19 - 32 mEq/L  '22  29  25   ' Calcium 8.4 - 10.5 mg/dL 10.1  9.9  9.8    Chest imaging: CXR 07/01/18 Heart and mediastinal contours are within normal limits. No focal opacities or effusions. No acute bony abnormality.  PFT:    Latest Ref Rng & Units 06/17/2015   12:48 PM  PFT Results  FVC-Pre L 4.62   FVC-Predicted Pre % 97   FVC-Post L 4.52   FVC-Predicted Post % 95   Pre FEV1/FVC % % 80   Post FEV1/FCV % % 86   FEV1-Pre L 3.69   FEV1-Predicted Pre % 104   FEV1-Post L 3.89   DLCO uncorrected ml/min/mmHg 29.01   DLCO UNC% % 86   DLVA Predicted % 96   2016: Within normal limits  Labs:  Path:  Echo 2019: LV  EF 55-60%. Atrial septal aneurysm present. Mild aortic regurgitation  Heart Catheterization:     Assessment & Plan:   Nocturnal hypoxemia - Plan: Pulse oximetry, overnight  Discussion: Robert Cordova is a 71 year old male, never smoker with DMII, GERD, hyperlipidemia, hypertension and sleep related hypoxemia.   We will check an ONO and send in updated orders for nocturnal oxygen supplies. He does not wish to have a repeat sleep study at this time for further evaluation of the nocturnal hypoxemia.   No other overt evidence for underlying respiratory condition leading to his nocturnal hypoxemia.   Follow up as needed.  Freda Jackson, MD [facility] Office: [phone]   Current Outpatient Medications:    OXYGEN-HELIUM IN, Inhale into the lungs. , Disp: , Rfl:    aspirin 81 MG tablet, Take 1 tablet (81 mg total) by mouth daily., Disp: 30 tablet, Rfl: 5   atorvastatin (LIPITOR) 20 MG tablet, TAKE 1 TABLET EVERY DAY, Disp: 90 tablet, Rfl: 2   Blood Glucose Calibration (TRUE METRIX LEVEL 1) Low SOLN, Use as instructed, Disp: 1 each, Rfl: 0   Blood Glucose Monitoring Suppl (TRUE METRIX AIR GLUCOSE METER) w/Device KIT, 1 Device by Other route as directed., Disp: 1 kit, Rfl: 0   Cholecalciferol (VITAMIN D PO), Take 50 mcg by mouth daily., Disp: , Rfl:    Coenzyme Q10 (CO Q-10) 200 MG CAPS, Take by mouth daily., Disp: , Rfl:    DROPLET PEN NEEDLES 32G X 4 MM MISC, USE TO INJECT ONE TIME DAILY, Disp: 100 each, Rfl: 3   empagliflozin (JARDIANCE) 25 MG TABS tablet, Take 1 tablet (25 mg total) by mouth daily., Disp: 90 tablet, Rfl: 3   glipiZIDE (GLUCOTROL) 5 MG tablet, TAKE 1 TABLET TWICE DAILY BEFORE MEALS, Disp: 180 tablet, Rfl: 3   glucose blood (TRUE METRIX BLOOD GLUCOSE TEST) test strip, TEST BLOOD SUGAR TWICE DAILY, Disp: 200 strip, Rfl: 3   insulin glargine (LANTUS SOLOSTAR) 100 UNIT/ML Solostar Pen, Inject 20-24 Units into the skin at bedtime., Disp: 30  mL, Rfl: 3   Lidocaine 1.8 % PTCH, Apply topically., Disp: , Rfl:    losartan (COZAAR) 50 MG tablet, TAKE 1 TABLET EVERY DAY, Disp: 90 tablet, Rfl: 1   Magnesium 250 MG TABS, Take by mouth daily. , Disp: , Rfl:    meloxicam (MOBIC) 7.5 MG tablet, TAKE 1 TABLET BY MOUTH DAILY. TAKE WITH FOOD, Disp: , Rfl:    metFORMIN (GLUCOPHAGE) 500 MG tablet, TAKE 2 TABLETS TWICE DAILY WITH MEALS, Disp: 360 tablet, Rfl: 3   minoxidil (ROGAINE) 2 % external solution, Apply 1 application topically daily., Disp: 60 mL, Rfl:  10   Multiple Vitamins-Minerals (MULTIVITAMIN WITH MINERALS) tablet, Take 1 tablet by mouth daily., Disp: , Rfl:    Semaglutide, 2 MG/DOSE, 8 MG/3ML SOPN, Inject 2 mg as directed once a week., Disp: 9 mL, Rfl: 3   triamcinolone cream (KENALOG) 0.1 %, SMARTSIG:1 Sparingly Topical Daily PRN, Disp: , Rfl:    TRUEplus Lancets 33G MISC, Use as instructed two times daily, Disp: 300 each, Rfl: 2   vitamin B-12 (CYANOCOBALAMIN) 100 MCG tablet, Take 100 mcg by mouth daily., Disp: , Rfl:

## 2022-02-28 NOTE — Patient Instructions (Addendum)
We will schedule you for overnight oximetry testing and send new orders to [facility]  Follow up as needed

## 2022-03-01 NOTE — Addendum Note (Signed)
Addended by: Valerie Salts on: 03/01/2022 10:06 AM   Modules accepted: Orders

## 2022-03-07 ENCOUNTER — Ambulatory Visit: Payer: Medicare HMO

## 2022-03-07 ENCOUNTER — Ambulatory Visit (INDEPENDENT_AMBULATORY_CARE_PROVIDER_SITE_OTHER): Payer: Medicare HMO

## 2022-03-07 VITALS — Ht 70.0 in | Wt 215.0 lb

## 2022-03-07 DIAGNOSIS — Z0000 Encounter for general adult medical examination without abnormal findings: Secondary | ICD-10-CM

## 2022-03-07 DIAGNOSIS — J9610 Chronic respiratory failure, unspecified whether with hypoxia or hypercapnia: Secondary | ICD-10-CM | POA: Diagnosis not present

## 2022-03-07 NOTE — Progress Notes (Signed)
I connected with Robert Cordova today by telephone and verified that I am speaking with the correct person using two identifiers. Location patient: home Location provider: work Persons participating in the virtual visit: Robert Cordova, Glenna Durand LPN.   I discussed the limitations, risks, security and privacy concerns of performing an evaluation and management service by telephone and the availability of in person appointments. I also discussed with the patient that there may be a patient responsible charge related to this service. The patient expressed understanding and verbally consented to this telephonic visit.    Interactive audio and video telecommunications were attempted between this provider and patient, however failed, due to patient having technical difficulties OR patient did not have access to video capability.  We continued and completed visit with audio only.     Vital signs may be patient reported or missing.  Subjective:   Robert Cordova is a 71 y.o. male who presents for Medicare Annual/Subsequent preventive examination.  Review of Systems     Cardiac Risk Factors include: advanced age (>45mn, >>42women);diabetes mellitus;dyslipidemia;hypertension;male gender;obesity (BMI >30kg/m2)     Objective:    Today's Vitals   03/07/22 0826 03/07/22 0827  Weight: 215 lb (97.5 kg)   Height: _0  (1.778 m)   PainSc:  1    Body mass index is 30.85 kg/m.     03/07/2022    8:33 AM 03/03/2021    2:02 PM 02/23/2021    9:01 AM 02/27/2020    9:19 AM 04/29/2017    6:16 AM 08/15/2016   11:41 AM 11/25/2013    4:43 PM  Advanced Directives  Does Patient Have a Medical Advance Directive? No Yes No No No No Patient does not have advance directive  Type of Advance Directive  HWayne      Does patient want to make changes to medical advance directive?  No - Patient declined       Copy of [place] Chart?  No - copy  requested       Would patient like information on creating a medical advance directive?  No - Patient declined Yes (MAU/Ambulatory/Procedural Areas - Information given) No - Patient declined No - Patient declined    Pre-existing out of facility DNR order (yellow form or pink MOST form)       No    Current Medications (verified) Outpatient Encounter Medications as of 03/07/2022  Medication Sig   aspirin 81 MG tablet Take 1 tablet (81 mg total) by mouth daily.   atorvastatin (LIPITOR) 20 MG tablet TAKE 1 TABLET EVERY DAY   Blood Glucose Calibration (TRUE METRIX LEVEL 1) Low SOLN Use as instructed   Blood Glucose Monitoring Suppl (TRUE METRIX AIR GLUCOSE METER) w/Device KIT 1 Device by Other route as directed.   Cholecalciferol (VITAMIN D PO) Take 50 mcg by mouth daily.   Coenzyme Q10 (CO Q-10) 200 MG CAPS Take by mouth daily.   DROPLET PEN NEEDLES 32G X 4 MM MISC USE TO INJECT ONE TIME DAILY   empagliflozin (JARDIANCE) 25 MG TABS tablet Take 1 tablet (25 mg total) by mouth daily.   glipiZIDE (GLUCOTROL) 5 MG tablet TAKE 1 TABLET TWICE DAILY BEFORE MEALS   glucose blood (TRUE METRIX BLOOD GLUCOSE TEST) test strip TEST BLOOD SUGAR TWICE DAILY   insulin glargine (LANTUS SOLOSTAR) 100 UNIT/ML Solostar Pen Inject 20-24 Units into the skin at bedtime.   Lidocaine 1.8 % PTCH Apply topically.   losartan (COZAAR) 50  MG tablet TAKE 1 TABLET EVERY DAY   Magnesium 250 MG TABS Take by mouth daily.    meloxicam (MOBIC) 7.5 MG tablet TAKE 1 TABLET BY MOUTH DAILY. TAKE WITH FOOD   metFORMIN (GLUCOPHAGE) 500 MG tablet TAKE 2 TABLETS TWICE DAILY WITH MEALS   minoxidil (ROGAINE) 2 % external solution Apply 1 application topically daily.   Semaglutide, 2 MG/DOSE, 8 MG/3ML SOPN Inject 2 mg as directed once a week.   triamcinolone cream (KENALOG) 0.1 % SMARTSIG:1 Sparingly Topical Daily PRN   TRUEplus Lancets 33G MISC Use as instructed two times daily   vitamin B-12 (CYANOCOBALAMIN) 100 MCG tablet Take 100 mcg by  mouth daily.   Multiple Vitamins-Minerals (MULTIVITAMIN WITH MINERALS) tablet Take 1 tablet by mouth daily. (Patient not taking: Reported on 03/07/2022)   OXYGEN-HELIUM IN Inhale into the lungs.    No facility-administered encounter medications on file as of 03/07/2022.    Allergies (verified) Ace inhibitors   History: Past Medical History:  Diagnosis Date   ABNORMAL GLUCOSE NEC 03/18/2007   Arthritis    fingers   BLEPHARITIS, LEFT 10/29/2009   DIABETES-TYPE 2 04/18/2010   Dry throat    Environmental allergies    GERD (gastroesophageal reflux disease)    H/O hiatal hernia    HYPERLIPIDEMIA 10/22/2007   HYPERTENSION 03/18/2007   Multiple thyroid nodules    "biopsy done, everything was fine" being monitored   NEVUS, MELANOCYTIC, FACE 10/07/2009   Past Surgical History:  Procedure Laterality Date   BIOPSY THYROID  ~08/2013   BREATH TEK H PYLORI N/A 09/05/2013   Procedure: [place];  Surgeon: [name], MD;  Location: [facility];  Service: General;  Laterality: N/A;   CATARACT EXTRACTION Bilateral    COLONOSCOPY  2009, 2013   Dr. Sharlett Iles; multiple polyps   HIATAL HERNIA REPAIR  11/25/2013   Procedure: LAPAROSCOPIC REPAIR OF HIATAL HERNIA;  Surgeon: [name], MD;  Location: [facility];  Service: General;;   LAPAROSCOPIC GASTRIC BANDING N/A 11/25/2013   Procedure: LAPAROSCOPIC GASTRIC BANDING;  Surgeon: [name], MD;  Location: [facility];  Service: General;  Laterality: N/A;   NO PAST SURGERIES     Family History  Problem Relation Age of Onset   Hyperlipidemia Mother    Hypertension Mother    Arthritis Mother    Cancer Mother    Hyperlipidemia Father    Hypertension Father    Heart attack Father    Hyperlipidemia Brother    Diabetes Maternal Grandmother    Diabetes Maternal Grandfather    Colon cancer Neg Hx    Stomach cancer Neg Hx    Esophageal cancer Neg Hx    Rectal cancer Neg Hx    Social History   Socioeconomic History   Marital status:  Married    Spouse name: Shyheem Whitham   Number of children: 2   Years of education: 16   Highest education level: Bachelor's degree (e.g., BA, AB, BS)  Occupational History   Occupation: Consultant-- self-employeed  Tobacco Use   Smoking status: Never    Passive exposure: Past   Smokeless tobacco: Never  Vaping Use   Vaping Use: Never used  Substance and Sexual Activity   Alcohol use: No   Drug use: No   Sexual activity: Not Currently    Partners: Female  Other Topics Concern   Not on file  Social History Narrative   Not on file   Social Determinants of Radio broadcast assistant  Strain: Low Risk  (03/07/2022)   Overall Financial Resource Strain (CARDIA)    Difficulty of Paying Living Expenses: Not hard at all  Food Insecurity: No Food Insecurity (03/07/2022)   Hunger Vital Sign    Worried About Running Out of Food in the Last Year: Never true    [place] in the Last Year: Never true  Transportation Needs: No Transportation Needs (03/07/2022)   PRAPARE - Hydrologist (Medical): No    Lack of Transportation (Non-Medical): No  Physical Activity: Inactive (03/07/2022)   Exercise Vital Sign    Days of Exercise per Week: 0 days    Minutes of Exercise per Session: 0 min  Stress: No Stress Concern Present (03/07/2022)   [place]    Feeling of Stress : Only a little  Social Connections: Moderately Isolated (03/03/2021)   Social Connection and Isolation Panel [NHANES]    Frequency of Communication with Friends and Family: More than three times a week    Frequency of Social Gatherings with Friends and Family: More than three times a week    Attends Religious Services: Never    Marine scientist or Organizations: No    Attends Music therapist: Never    Marital Status: Married    Tobacco Counseling Counseling given: Not Answered   Clinical  Intake:  Pre-visit preparation completed: Yes  Pain : 0-10 Pain Score: 1  Pain Type: Chronic pain Pain Location: Generalized Pain Descriptors / Indicators: Aching Pain Onset: More than a month ago Pain Frequency: Constant     Nutritional Status: BMI > 30  Obese Nutritional Risks: None Diabetes: Yes  How often do you need to have someone help you when you read instructions, pamphlets, or other written materials from your doctor or pharmacy?: 1 - Never What is the last grade level you completed in school?: MBA  Diabetic? Yes Nutrition Risk Assessment:  Has the patient had any N/V/D within the last 2 months?  No  Does the patient have any non-healing wounds?  No  Has the patient had any unintentional weight loss or weight gain?  No   Diabetes:  Is the patient diabetic?  Yes  If diabetic, was a CBG obtained today?  No  Did the patient bring in their glucometer from home?  No  How often do you monitor your CBG's? daily.   Financial Strains and Diabetes Management:  Are you having any financial strains with the device, your supplies or your medication? No .  Does the patient want to be seen by Chronic Care Management for management of their diabetes?  No  Would the patient like to be referred to a Nutritionist or for Diabetic Management?  No   Diabetic Exams:  Diabetic Eye Exam: Overdue for diabetic eye exam. Pt has been advised about the importance in completing this exam. Patient advised to call and schedule an eye exam. Diabetic Foot Exam: Completed 12/13/2021   Interpreter Needed?: No  Information entered by :: NAllen LPN   Activities of Daily Living    03/07/2022    8:35 AM  In your present state of health, do you have any difficulty performing the following activities:  Hearing? 0  Vision? 0  Comment some blurriness when watching tv sometimes  Difficulty concentrating or making decisions? 0  Walking or climbing stairs? 0  Dressing or bathing? 0  Doing  errands, shopping? 0  Preparing Food  and eating ? N  Using the Toilet? N  In the past six months, have you accidently leaked urine? Y  Comment seeing an urologist  Do you have problems with loss of bowel control? N  Managing your Medications? N  Managing your Finances? N  Housekeeping or managing your Housekeeping? N    Patient Care Team: Billie Ruddy, MD as PCP - General (Family Medicine) Park Liter, MD as PCP - Cardiology (Cardiology)  Indicate any recent Medical Services you may have received from other than [facility] providers in the past year (date may be approximate).     Assessment:   This is a routine wellness examination for [place].  Hearing/Vision screen Vision Screening - Comments:: Regular eye exams, [facility], Dr. Nicki Reaper  Dietary issues and exercise activities discussed: Current Exercise Habits: The patient does not participate in regular exercise at present   Goals Addressed             This Visit's Progress    Patient Stated       03/07/2022, stay alive       Depression Screen    03/07/2022    8:34 AM 08/12/2021   10:41 AM 03/03/2021    2:01 PM 02/23/2021    8:57 AM 02/27/2020    9:25 AM 05/15/2017    8:23 AM 08/15/2016   11:45 AM  PHQ 2/9 Scores  PHQ - 2 Score 0 _0 0 0 0  PHQ- 9 Score  9   0 0     Fall Risk    03/07/2022    8:34 AM 08/12/2021   10:41 AM 03/03/2021    2:00 PM 02/23/2021    9:03 AM 02/27/2020    9:21 AM  Fall Risk   Falls in the past year? 0 0 0 0 0  Number falls in past yr: 0  0 0 0  Injury with Fall? 0  0 0 0  Risk for fall due to : Medication side effect  No Fall Risks;Impaired vision Impaired vision Medication side effect;Orthopedic patient  Follow up Falls evaluation completed;Education provided;Falls prevention discussed  Falls evaluation completed;Education provided;Falls prevention discussed Falls prevention discussed Falls evaluation completed;Falls prevention discussed    FALL RISK PREVENTION PERTAINING  TO THE HOME:  Any stairs in or around the home? Yes  If so, are there any without handrails? No  Home free of loose throw rugs in walkways, pet beds, electrical cords, etc? Yes  Adequate lighting in your home to reduce risk of falls? Yes   ASSISTIVE DEVICES UTILIZED TO PREVENT FALLS:  Life alert? No  Use of a cane, walker or w/c? No  Grab bars in the bathroom? No  Shower chair or bench in shower? Yes  Elevated toilet seat or a handicapped toilet? Yes   TIMED UP AND GO:  Was the test performed? No .      Cognitive Function:        03/07/2022    8:37 AM 02/23/2021    9:05 AM 02/27/2020    9:32 AM 08/15/2016   11:50 AM  6CIT Screen  What Year? 0 points 0 points 0 points 0 points  What month? 0 points 0 points 0 points 0 points  What time? 0 points 0 points 0 points 0 points  Count back from 20 0 points 0 points 0 points 0 points  Months in reverse 0 points 0 points 0 points 0 points  Repeat phrase 2 points 0 points 2  points 0 points  Total Score 2 points 0 points 2 points 0 points    Immunizations Immunization History  Administered Date(s) Administered   Fluad Quad(high Dose 65+) 04/25/2019, 05/20/2020   Influenza Split 07/13/2011, 05/15/2012   Influenza Whole 05/09/2008, 05/10/2009, 04/18/2010   Influenza, High Dose Seasonal PF 05/30/2016, 04/30/2018   Influenza,inj,Quad PF,6+ Mos 04/22/2013, 05/07/2014, 05/07/2015, 05/15/2017   Influenza-Unspecified 06/21/2021   PFIZER(Purple Top)SARS-COV-2 Vaccination 08/20/2019, 09/09/2019, 05/08/2020, 03/05/2021, 07/08/2021   Pneumococcal Conjugate-13 05/07/2014   Pneumococcal Polysaccharide-23 06/22/2015   Td 08/07/2002, 08/07/2006   Tdap 05/07/2014   Zoster, Live 01/30/2011    TDAP status: Up to date  Flu Vaccine status: Up to date  Pneumococcal vaccine status: Up to date  Covid-19 vaccine status: Completed vaccines  Qualifies for Shingles Vaccine? Yes   Zostavax completed Yes   Shingrix Completed?: No.    Education  has been provided regarding the importance of this vaccine. Patient has been advised to call insurance company to determine out of pocket expense if they have not yet received this vaccine. Advised may also receive vaccine at local pharmacy or Health Dept. Verbalized acceptance and understanding.  Screening Tests Health Maintenance  Topic Date Due   Zoster Vaccines- Shingrix (1 of 2) Never done   COLONOSCOPY (Pts 45-76yr Insurance coverage will need to be confirmed)  09/02/2017   COVID-19 Vaccine (6 - Booster for Pfizer series) 09/02/2021   OPHTHALMOLOGY EXAM  10/12/2021   INFLUENZA VACCINE  03/07/2022   HEMOGLOBIN A1C  06/15/2022   FOOT EXAM  12/14/2022   TETANUS/TDAP  05/07/2024   Pneumonia Vaccine 71 Years old  Completed   Hepatitis C Screening  Completed   HPV VACCINES  Aged Out    Health Maintenance  Health Maintenance Due  Topic Date Due   Zoster Vaccines- Shingrix (1 of 2) Never done   COLONOSCOPY (Pts 45-488yrInsurance coverage will need to be confirmed)  09/02/2017   COVID-19 Vaccine (6 - Booster for Pfizer series) 09/02/2021   OPHTHALMOLOGY EXAM  10/12/2021   INFLUENZA VACCINE  03/07/2022    Colorectal cancer screening: Type of screening: Colonoscopy. Completed 09/02/2012. Repeat every 10 years  Lung Cancer Screening: (Low Dose CT Chest recommended if Age 73-80ears, 30 pack-year currently smoking OR have quit w/in 15years.) does not qualify.   Lung Cancer Screening Referral: no  Additional Screening:  Hepatitis C Screening: does qualify; Completed 02/15/2016  Vision Screening: Recommended annual ophthalmology exams for early detection of glaucoma and other disorders of the eye. Is the patient up to date with their annual eye exam?  Yes  Who is the provider or what is the name of the office in which the patient attends annual eye exams? [facility] If pt is not established with a provider, would they like to be referred to a provider to establish care?  No .   Dental Screening: Recommended annual dental exams for proper oral hygiene  Community Resource Referral / Chronic Care Management: CRR required this visit?  No   CCM required this visit?  No      Plan:     I have personally reviewed and noted the following in the patient's chart:   Medical and social history Use of alcohol, tobacco or illicit drugs  Current medications and supplements including opioid prescriptions. Patient is not currently taking opioid prescriptions. Functional ability and status Nutritional status Physical activity Advanced directives List of other physicians Hospitalizations, surgeries, and ER visits in previous 12 months Vitals Screenings to include  cognitive, depression, and falls Referrals and appointments  In addition, I have reviewed and discussed with patient certain preventive protocols, quality metrics, and best practice recommendations. A written personalized care plan for preventive services as well as general preventive health recommendations were provided to patient.     Kellie Simmering, LPN   08/12/1094   Nurse Notes: none  Due to this being a virtual visit, the after visit summary with patients personalized plan was offered to patient via mail or my-chart. Patient would like to access on my-chart

## 2022-03-07 NOTE — Patient Instructions (Signed)
Robert Cordova , Thank you for taking time to come for your Medicare Wellness Visit. I appreciate your ongoing commitment to your health goals. Please review the following plan we discussed and let me know if I can assist you in the future.   Screening recommendations/referrals: Colonoscopy: completed 09/02/2012, due 09/02/2022 Recommended yearly ophthalmology/optometry visit for glaucoma screening and checkup Recommended yearly dental visit for hygiene and checkup  Vaccinations: Influenza vaccine: due 03/07/2022 Pneumococcal vaccine: completed 06/22/2015 Tdap vaccine: completed 05/07/2014, due 05/07/2024 Shingles vaccine: discussed   Covid-19:  07/08/2021, 03/05/2021, 05/08/2020, 09/09/2019, 08/20/2019  Advanced directives: Advance directive discussed with you today.   Conditions/risks identified: none  Next appointment: Follow up in one year for your annual wellness visit.   Preventive Care 70 Years and Older, Male Preventive care refers to lifestyle choices and visits with your health care provider that can promote health and wellness. What does preventive care include? A yearly physical exam. This is also called an annual well check. Dental exams once or twice a year. Routine eye exams. Ask your health care provider how often you should have your eyes checked. Personal lifestyle choices, including: Daily care of your teeth and gums. Regular physical activity. Eating a healthy diet. Avoiding tobacco and drug use. Limiting alcohol use. Practicing safe sex. Taking low doses of aspirin every day. Taking vitamin and mineral supplements as recommended by your health care provider. What happens during an annual well check? The services and screenings done by your health care provider during your annual well check will depend on your age, overall health, lifestyle risk factors, and family history of disease. Counseling  Your health care provider may ask you questions about your: Alcohol  use. Tobacco use. Drug use. Emotional well-being. Home and relationship well-being. Sexual activity. Eating habits. History of falls. Memory and ability to understand (cognition). Work and work Statistician. Screening  You may have the following tests or measurements: Height, weight, and BMI. Blood pressure. Lipid and cholesterol levels. These may be checked every 5 years, or more frequently if you are over 21 years old. Skin check. Lung cancer screening. You may have this screening every year starting at age 54 if you have a 30-pack-year history of smoking and currently smoke or have quit within the past 15 years. Fecal occult blood test (FOBT) of the stool. You may have this test every year starting at age 54. Flexible sigmoidoscopy or colonoscopy. You may have a sigmoidoscopy every 5 years or a colonoscopy every 10 years starting at age 67. Prostate cancer screening. Recommendations will vary depending on your family history and other risks. Hepatitis C blood test. Hepatitis B blood test. Sexually transmitted disease (STD) testing. Diabetes screening. This is done by checking your blood sugar (glucose) after you have not eaten for a while (fasting). You may have this done every 1-3 years. Abdominal aortic aneurysm (AAA) screening. You may need this if you are a current or former smoker. Osteoporosis. You may be screened starting at age 28 if you are at high risk. Talk with your health care provider about your test results, treatment options, and if necessary, the need for more tests. Vaccines  Your health care provider may recommend certain vaccines, such as: Influenza vaccine. This is recommended every year. Tetanus, diphtheria, and acellular pertussis (Tdap, Td) vaccine. You may need a Td booster every 10 years. Zoster vaccine. You may need this after age 67. Pneumococcal 13-valent conjugate (PCV13) vaccine. One dose is recommended after age 52. Pneumococcal polysaccharide  (PPSV23)  vaccine. One dose is recommended after age 25. Talk to your health care provider about which screenings and vaccines you need and how often you need them. This information is not intended to replace advice given to you by your health care provider. Make sure you discuss any questions you have with your health care provider. Document Released: 08/20/2015 Document Revised: 04/12/2016 Document Reviewed: 05/25/2015 Elsevier Interactive Patient Education  2017 [employer] Prevention in the Home Falls can cause injuries. They can happen to people of all ages. There are many things you can do to make your home safe and to help prevent falls. What can I do on the outside of my home? Regularly fix the edges of walkways and driveways and fix any cracks. Remove anything that might make you trip as you walk through a door, such as a raised step or threshold. Trim any bushes or trees on the path to your home. Use bright outdoor lighting. Clear any walking paths of anything that might make someone trip, such as rocks or tools. Regularly check to see if handrails are loose or broken. Make sure that both sides of any steps have handrails. Any raised decks and porches should have guardrails on the edges. Have any leaves, snow, or ice cleared regularly. Use sand or salt on walking paths during winter. Clean up any spills in your garage right away. This includes oil or grease spills. What can I do in the bathroom? Use night lights. Install grab bars by the toilet and in the tub and shower. Do not use towel bars as grab bars. Use non-skid mats or decals in the tub or shower. If you need to sit down in the shower, use a plastic, non-slip stool. Keep the floor dry. Clean up any water that spills on the floor as soon as it happens. Remove soap buildup in the tub or shower regularly. Attach bath mats securely with double-sided non-slip rug tape. Do not have throw rugs and other things on the  floor that can make you trip. What can I do in the bedroom? Use night lights. Make sure that you have a light by your bed that is easy to reach. Do not use any sheets or blankets that are too big for your bed. They should not hang down onto the floor. Have a firm chair that has side arms. You can use this for support while you get dressed. Do not have throw rugs and other things on the floor that can make you trip. What can I do in the kitchen? Clean up any spills right away. Avoid walking on wet floors. Keep items that you use a lot in easy-to-reach places. If you need to reach something above you, use a strong step stool that has a grab bar. Keep electrical cords out of the way. Do not use floor polish or wax that makes floors slippery. If you must use wax, use non-skid floor wax. Do not have throw rugs and other things on the floor that can make you trip. What can I do with my stairs? Do not leave any items on the stairs. Make sure that there are handrails on both sides of the stairs and use them. Fix handrails that are broken or loose. Make sure that handrails are as long as the stairways. Check any carpeting to make sure that it is firmly attached to the stairs. Fix any carpet that is loose or worn. Avoid having throw rugs at the top or bottom of  the stairs. If you do have throw rugs, attach them to the floor with carpet tape. Make sure that you have a light switch at the top of the stairs and the bottom of the stairs. If you do not have them, ask someone to add them for you. What else can I do to help prevent falls? Wear shoes that: Do not have high heels. Have rubber bottoms. Are comfortable and fit you well. Are closed at the toe. Do not wear sandals. If you use a stepladder: Make sure that it is fully opened. Do not climb a closed stepladder. Make sure that both sides of the stepladder are locked into place. Ask someone to hold it for you, if possible. Clearly mark and make  sure that you can see: Any grab bars or handrails. First and last steps. Where the edge of each step is. Use tools that help you move around (mobility aids) if they are needed. These include: Canes. Walkers. Scooters. Crutches. Turn on the lights when you go into a dark area. Replace any light bulbs as soon as they burn out. Set up your furniture so you have a clear path. Avoid moving your furniture around. If any of your floors are uneven, fix them. If there are any pets around you, be aware of where they are. Review your medicines with your doctor. Some medicines can make you feel dizzy. This can increase your chance of falling. Ask your doctor what other things that you can do to help prevent falls. This information is not intended to replace advice given to you by your health care provider. Make sure you discuss any questions you have with your health care provider. Document Released: 05/20/2009 Document Revised: 12/30/2015 Document Reviewed: 08/28/2014 Elsevier Interactive Patient Education  2017 [employer].

## 2022-03-24 DIAGNOSIS — L918 Other hypertrophic disorders of the skin: Secondary | ICD-10-CM | POA: Diagnosis not present

## 2022-03-24 DIAGNOSIS — L814 Other melanin hyperpigmentation: Secondary | ICD-10-CM | POA: Diagnosis not present

## 2022-03-24 DIAGNOSIS — D225 Melanocytic nevi of trunk: Secondary | ICD-10-CM | POA: Diagnosis not present

## 2022-03-24 DIAGNOSIS — L821 Other seborrheic keratosis: Secondary | ICD-10-CM | POA: Diagnosis not present

## 2022-03-24 DIAGNOSIS — L308 Other specified dermatitis: Secondary | ICD-10-CM | POA: Diagnosis not present

## 2022-03-25 ENCOUNTER — Other Ambulatory Visit: Payer: Self-pay | Admitting: Internal Medicine

## 2022-03-29 ENCOUNTER — Other Ambulatory Visit: Payer: Self-pay | Admitting: Internal Medicine

## 2022-03-29 DIAGNOSIS — E1165 Type 2 diabetes mellitus with hyperglycemia: Secondary | ICD-10-CM

## 2022-03-30 ENCOUNTER — Encounter: Payer: Self-pay | Admitting: Internal Medicine

## 2022-03-30 DIAGNOSIS — R0683 Snoring: Secondary | ICD-10-CM | POA: Diagnosis not present

## 2022-03-30 DIAGNOSIS — G4730 Sleep apnea, unspecified: Secondary | ICD-10-CM | POA: Diagnosis not present

## 2022-04-06 ENCOUNTER — Telehealth: Payer: Self-pay | Admitting: Pulmonary Disease

## 2022-04-06 DIAGNOSIS — G4734 Idiopathic sleep related nonobstructive alveolar hypoventilation: Secondary | ICD-10-CM

## 2022-04-06 NOTE — Telephone Encounter (Signed)
Patient has requalified for nocturnal oxygen. He spent 40 min 49 sec with SpO2 less than 88%. Please place updated order to his DME company for oxygen supplies. Please ask the patient his current level of oxygen settings and he can continue those.  Thanks, JD

## 2022-04-07 DIAGNOSIS — J9610 Chronic respiratory failure, unspecified whether with hypoxia or hypercapnia: Secondary | ICD-10-CM | POA: Diagnosis not present

## 2022-04-11 NOTE — Telephone Encounter (Signed)
Called and left voicemail for patient and advised him to call office back in regards to ONO test results.

## 2022-04-12 NOTE — Telephone Encounter (Signed)
Called and spoke with patient.  ONO results given.  Understanding stated.  Patient stated he is currently on 2L [place] at night through [employer].  Updated care coordination note and new DME order place to continue O2 supplies.  Nothing further at this time.

## 2022-04-12 NOTE — Telephone Encounter (Signed)
Patient called back for results. Please advise.

## 2022-04-13 ENCOUNTER — Ambulatory Visit: Payer: Medicare HMO | Admitting: Internal Medicine

## 2022-04-13 ENCOUNTER — Encounter: Payer: Self-pay | Admitting: Internal Medicine

## 2022-04-13 VITALS — BP 130/80 | HR 60 | Ht 70.0 in | Wt 214.2 lb

## 2022-04-13 DIAGNOSIS — R200 Anesthesia of skin: Secondary | ICD-10-CM

## 2022-04-13 DIAGNOSIS — E042 Nontoxic multinodular goiter: Secondary | ICD-10-CM | POA: Diagnosis not present

## 2022-04-13 DIAGNOSIS — E782 Mixed hyperlipidemia: Secondary | ICD-10-CM | POA: Diagnosis not present

## 2022-04-13 DIAGNOSIS — E1165 Type 2 diabetes mellitus with hyperglycemia: Secondary | ICD-10-CM | POA: Diagnosis not present

## 2022-04-13 LAB — POCT GLYCOSYLATED HEMOGLOBIN (HGB A1C): Hemoglobin A1C: 6.3 % — AB (ref 4.0–5.6)

## 2022-04-13 NOTE — Patient Instructions (Addendum)
Please use:  - Metformin 2000 mg with dinner - Glipizide  5-10 mg 30 min before dinner, depending on the size of the meal - Jardiance 25 mg before b'fast - Lantus 25 units at bedtime - Ozempic 2 mg weekly  Please return in 4-6 months with your sugar log

## 2022-04-13 NOTE — Progress Notes (Signed)
Patient ID: Robert Cordova, male   DOB: 02-Dec-1950, 71 y.o.   MRN: [id number]   HPI: Robert Cordova is a 71 y.o.-year-old male, returning for f/u for DM2, dx in 2013, insulin-dependent since 04/2020, uncontrolled, with complications (h/o DKA). Last visit 4 months ago.  Interim history: He has increased urination (saw Urology and the medication was prescribed, but he developed joint pains and stopped), no blurry vision, nausea, chest pain.  Reviewed HbA1c levels: Lab Results  Component Value Date   HGBA1C 6.4 (A) 12/13/2021   HGBA1C 7.3 (H) 08/12/2021   HGBA1C 7.1 (A) 08/09/2021  08/22/2016: 7.7%  He is on: - Metformin 1000 mg 2x a day, with meals >> 2000 mg with dinner - Glipizide 10 mg after dinner >> 10 >> 5-10 mg 30 minutes before dinner  - Jardiance 25 mg before b'fast - Ozempic 0.5 mg weekly-added 09/2018 >> 1 >> 2 mg weekly - Lantus -started 04/2020 >> .Marland KitchenMarland Kitchen20-24 >> 24 >> 25 units at bedtime Of note, He was off and on insulin in the past. He came off when he lost weight after his lab band surgery in 2014 (45 pounds)  He is checking sugars 1-2 times a day: - am: 108, 130-173, 192 >> 91-163 >> 75-128, 137 >> 63, 87-136, 169 - 2h after b'fast: 87-125, 147 >> n/c >> 181, 215 >> n/c - before lunch: 99-122 >> 148 >> n/c >> 148 >> n/c - 2h after lunch: n/c >> 118-215 >> n/c >> 121 >> n/c - before dinner:  119 >> 108-134 >> 94, 138, 150 >> n/c  - 2h after dinner:328 (cheese cake) >> n/c >> 208 >> n/c - bedtime:  115-211, 228, 282 >> 64, 79-190, 226 >> 82-192, 202 - nighttime: n/c >> 212 >> n/c  Lowest sugar was 70 >> 90s >> 64 after working in the yard >> 63; he has hypoglycemia awareness in the 70s. Highest sugar was 282 >> 226 x1 >> 203.  Pt's meals are: - Breakfast: Protein shake - Lunch (12-1): Eggs, bacon or leftovers - Dinner: Meat + veggies + starch, pizza, [place] food - Snacks: In the evening: cookies, etc. "I snack until I go to bed"  -No CKD, last  BUN/creatinine:  Lab Results  Component Value Date   BUN 18 08/12/2021   BUN 17 08/11/2020   CREATININE 0.72 08/12/2021   CREATININE 0.75 08/11/2020  On losartan 25 mg twice a day.  -+ HL; last set of lipids: Lab Results  Component Value Date   CHOL 132 08/12/2021   HDL 39.10 08/12/2021   LDLCALC 60 08/12/2021   LDLDIRECT 72.0 05/07/2018   TRIG 165.0 (H) 08/12/2021   CHOLHDL 3 08/12/2021  On Lipitor 20, co-Q10, Krill oil  - last eye exam was on 10/12/2020: No DR, + incipient cataract. [facility].  She sees Dr. Nicki Reaper and Dr. Katherene Ponto.  -+ Numbness in  toes.  Last foot exam 12/2021.  B12 was elevated in 01/2020-we increased his supplement dose from 5000 to 2500 mcg daily.   However, at last visit he was only on 15 mcg daily and I advised him to add 500 mcg daily.  He now takes 1000 mcg qod. Stopped B complex.  Reviewed his B12 level: Lab Results  Component Value Date   HYIFOYDX41 287 08/12/2021   VITAMINB12 599 11/23/2020   VITAMINB12 >1526 (H) 01/06/2020   Thyroid nodules: - s/p 2 benign biopsies in 2014  Pt denies: - feeling nodules in neck - hoarseness - dysphagia -  choking  Reviewed thyroid imaging results and biopsy results: Thyroid U/S (10/20/2016): Stable 2.8 cm solid nodule in the inferior right lobe.  This was previously sampled with negative biopsy.  The 1.8 cm nodule previously sampled in the right isthmus was no longer identified by ultrasound.  Thyroid U/S (01/15/2020): COMPARISON:  10/19/2016  FINDINGS: Parenchymal Echotexture: Mildly heterogenous Isthmus: 0.9 cm Right lobe: 6.1 x 2.6 x 1.9 cm. Left lobe: 5.4 x 2.3 x 1.3 cm _________________________________________________________  Nodule # 2: Prior biopsy: Yes Location: Right; Inferior Maximum size: 3.7 cm; Other 2 dimensions: 3.1 x 2.8 cm, previously, 2.8 x 2.6 x 2.1 cm Composition: solid/almost completely solid (2) Echogenicity: isoechoic (1) Significant change in size (>/= 20% in  two dimensions and minimal increase of 2 mm): Yes  **Given size (>/= 2.5 cm) and appearance, fine needle aspiration of this mildly suspicious nodule should be considered based on TI-RADS criteria. _______________________________________________________  There is a stable small hypoechoic nodule measuring approximately 7 mm in the right mid thyroid gland.  IMPRESSION: Interval increase in size of the dominant right-sided thyroid nodule, currently measuring 3.7 cm (previously measuring 2.8 cm). While this thyroid nodule was previously biopsied, consider repeat ultrasound or fine-needle aspiration given the nodule's significant interval growth.  Thyroid FNA (01/20/2020): FINAL MICROSCOPIC DIAGNOSIS:  - Consistent with benign follicular nodule (Bethesda category II)   SPECIMEN ADEQUACY:  Satisfactory for evaluation   Thyroid U/S (12/16/2021): Parenchymal Echotexture: Moderately heterogenous  Isthmus: 2.6 cm  Right lobe: 6.1 cm x 2.1 cm x 2.1 cm  Left lobe: 5.0 cm x 2.2 cm x 1.9 cm  _________________________________________________   Nodule labeled 1 superior right thyroid 8 mm, with TR 2/cystic characteristics and does not meet criteria for surveillance.   Nodule labeled 2, lower right thyroid, 4.2 cm. Nodule has been previously biopsied. Assuming benign result, no further specific follow-up would be indicated.   Nodule labeled 3 inferior right thyroid, 1.1 cm with spongiform characteristics. Nodule does not meet criteria for further surveillance.   Nodule labeled 4, lower left thyroid, 1.4 cm. Nodule has TR 3 characteristics and does not meet criteria for surveillance.   Nodule labeled 5, lower left thyroid, 7 mm with spongiform characteristics and does not meet criteria for surveillance.   Nodule labeled 6, inferior left thyroid, 1.4 cm with TR 3 characteristics. Nodule does not meet criteria for surveillance.   No adenopathy.   Recommendations follow those  established by the new ACR TI-RADS criteria (J Am Coll Radiol 5188;41:660-630).   IMPRESSION: Redemonstration of heterogeneous/multinodular thyroid which may indicate medical thyroid disease.   Assuming a benign result of the prior biopsy of nodule labeled 2, no further specific follow-up would be indicated.  Latest TSH was normal: Lab Results  Component Value Date   TSH 0.39 08/12/2021   He also has sleep-related hypoxia, HTN. He has a history of gastric banding (11/25/2013).  Weight before surgery: 259 pounds. He has Medicare with 2 supplemental insurances.  ROS: + see HPI Neurological: + numbness/no tingling/no dizziness  I reviewed pt's medications, allergies, PMH, social hx, family hx, and changes were documented in the history of present illness. Otherwise, unchanged from my initial visit note.  Past Medical History:  Diagnosis Date   ABNORMAL GLUCOSE NEC 03/18/2007   Arthritis    fingers   BLEPHARITIS, LEFT 10/29/2009   DIABETES-TYPE 2 04/18/2010   Dry throat    Environmental allergies    GERD (gastroesophageal reflux disease)    H/O hiatal hernia  HYPERLIPIDEMIA 10/22/2007   HYPERTENSION 03/18/2007   Multiple thyroid nodules    "biopsy done, everything was fine" being monitored   NEVUS, MELANOCYTIC, FACE 10/07/2009   Past Surgical History:  Procedure Laterality Date   BIOPSY THYROID  ~08/2013   BREATH TEK H PYLORI N/A 09/05/2013   Procedure: BREATH TEK H PYLORI;  Surgeon: [name], MD;  Location: [facility];  Service: General;  Laterality: N/A;   CATARACT EXTRACTION Bilateral    COLONOSCOPY  2009, 2013   Dr. 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 30 tablet 5   atorvastatin (LIPITOR) 20 MG tablet TAKE 1 TABLET EVERY DAY 90 tablet 2   Blood Glucose Calibration (TRUE METRIX LEVEL 1) Low SOLN Use as instructed 1 each 0   Blood Glucose Monitoring Suppl (TRUE METRIX AIR GLUCOSE METER) w/Device KIT 1 Device by Other route as directed. 1 kit 0   Cholecalciferol (VITAMIN D PO) Take 50 mcg by mouth daily.     Coenzyme Q10 (CO Q-10) 200 MG CAPS Take by mouth daily.     DROPLET PEN NEEDLES 32G X 4 MM MISC USE TO INJECT ONE TIME DAILY 100 each 3   empagliflozin (JARDIANCE) 25 MG TABS tablet Take 1 tablet (25 mg total) by mouth daily. 90 tablet 3   glipiZIDE (GLUCOTROL) 5 MG tablet TAKE 1 TABLET TWICE DAILY BEFORE MEALS 180 tablet 3   insulin glargine (LANTUS SOLOSTAR) 100 UNIT/ML Solostar Pen Inject 20-24 Units into the skin at bedtime. 30 mL 3   Lidocaine 1.8 % PTCH Apply topically.     losartan (COZAAR) 50 MG tablet TAKE 1 TABLET EVERY DAY 90 tablet 1   Magnesium 250 MG TABS Take by mouth daily.      meloxicam (MOBIC) 7.5 MG tablet TAKE 1 TABLET BY MOUTH DAILY. TAKE WITH FOOD     metFORMIN (GLUCOPHAGE) 500 MG tablet TAKE 2 TABLETS TWICE DAILY WITH MEALS 360 tablet 3   minoxidil (ROGAINE) 2 % external solution Apply 1 application topically daily. 60 mL 10   Multiple Vitamins-Minerals (MULTIVITAMIN WITH MINERALS) tablet Take 1 tablet by mouth daily. (Patient not taking: Reported on 03/07/2022)     OXYGEN-HELIUM IN Inhale into the lungs.      Semaglutide, 2  MG/DOSE, 8 MG/3ML SOPN Inject 2 mg as directed once a week. 9 mL 3   triamcinolone cream (KENALOG) 0.1 % SMARTSIG:1 Sparingly Topical Daily PRN     TRUE METRIX BLOOD GLUCOSE TEST test strip TEST BLOOD SUGAR TWICE DAILY 200 strip 0   TRUEplus Lancets 33G MISC Use as instructed two times daily 300 each  2   vitamin B-12 (CYANOCOBALAMIN) 100 MCG tablet Take 100 mcg by mouth daily.     No current facility-administered medications on file prior to visit.   Allergies  Allergen Reactions   Ace Inhibitors     REACTION: Coughing   Family History  Problem Relation Age of Onset   Hyperlipidemia Mother    Hypertension Mother    Arthritis Mother    Cancer Mother    Hyperlipidemia Father    Hypertension Father    Heart attack Father    Hyperlipidemia Brother    Diabetes Maternal Grandmother    Diabetes Maternal Grandfather    Colon cancer Neg Hx    Stomach cancer Neg Hx    Esophageal cancer Neg Hx    Rectal cancer Neg Hx     PE: BP 130/80 (BP Location: Right Arm, Patient Position: Sitting, Cuff Size: Normal)   Pulse 60   Ht '5\' 10"'  (1.778 m)   Wt 214 lb 3.2 oz (97.2 kg)   SpO2 95%   BMI 30.73 kg/m  Wt Readings from Last 3 Encounters:  04/13/22 214 lb 3.2 oz (97.2 kg)  03/07/22 215 lb (97.5 kg)  02/28/22 216 lb (98 kg)   Constitutional: overweight, in NAD Eyes: EOMI, no exophthalmos ENT: moist mucous membranes, no thyromegaly but R inf thyroid nodule palpated, measuring approximately 2.5 cm, no cervical lymphadenopathy Cardiovascular: RRR, No MRG Respiratory: CTA B Musculoskeletal: no deformities Skin: moist, warm, no rashes Neurological: + tremor with outstretched hands  ASSESSMENT: 1. DM2, insulin-dependent, uncontrolled, without long term complications, but with hyperglycemia - h/o DKA  2.  Multinodular goiter  3. HL  4.  Numbness in toes/high B12 level  PLAN:  1. Patient with longstanding, uncontrolled, type 2 diabetes, on oral antidiabetic regimen with metformin,  sulfonylurea, SGLT2 inhibitor and also weekly GLP-1 receptor agonist, with improving control.  At last visit, HbA1c was lower, at 6.4%.  He felt that increasing the dose of Ozempic at the previous visit helped.  We discussed about backing off the dose of glipizide with smaller meals as he had a 1 low blood sugar in the 60s after working in his yard and occasional mild lows at bedtime.  We continued the rest of the regimen. -At today's visit, sugars are mostly at goal, with only occasional hyperglycemic exceptions especially after dietary indiscretions.  We discussed about trying to stop the snacks at night, but otherwise, I do not feel we need to change his regimen - I suggested to:  Patient Instructions  Please use:  - Metformin 2000 mg with dinner - Glipizide  5-10 mg 30 min before dinner, depending on the size of the meal - Jardiance 25 mg before b'fast - Lantus 25 units at bedtime - Ozempic 2 mg weekly  Please return in 4-6 months with your sugar log  - we checked his HbA1c: 6.3% (improved from last visit) - advised to check sugars at different times of the day - 1-2x a day, rotating check times - advised for yearly eye exams >> he has an appointment coming up - return to clinic in 4-6 months  2. Thyroid nodules and goiter -Nodules appears to be stable in size on the ultrasound from 2018.  However, thyroid ultrasound from 01/2020 showed an increase in the right inferior dominant nodule size from 2.8 to 3.7 cm.  FNA of this nodule was benign.  We checked another neck ultrasound after last visit and the nodules appeared to be low risk, without the need  for further investigation or follow-up -Latest TSH was normal Lab Results  Component Value Date   TSH 0.39 08/12/2021  -No neck compression symptoms  3. HL -Reviewed his latest lipid panel from 08/2021: Fractions at goal with exception of a slightly high triglyceride level: Lab Results  Component Value Date   CHOL 132 08/12/2021   HDL  39.10 08/12/2021   LDLCALC 60 08/12/2021   LDLDIRECT 72.0 05/07/2018   TRIG 165.0 (H) 08/12/2021   CHOLHDL 3 08/12/2021  -He continues on Lipitor 20 mg daily, Krill oil, co-Q10, without side effects  4.  Elevated B12 -He usually had numbness and tingling in his hands and a B12 was found to be low -We started supplementation, initially with 5000 mcg daily but gradually decreased to 1000 mcg every other day. -At the last check, B12 level was normal in 08/2021   Philemon Kingdom, MD PhD [facility]

## 2022-04-21 ENCOUNTER — Other Ambulatory Visit: Payer: Self-pay | Admitting: Family Medicine

## 2022-04-21 DIAGNOSIS — E785 Hyperlipidemia, unspecified: Secondary | ICD-10-CM

## 2022-06-06 ENCOUNTER — Telehealth (INDEPENDENT_AMBULATORY_CARE_PROVIDER_SITE_OTHER): Payer: Medicare HMO | Admitting: Family Medicine

## 2022-06-06 ENCOUNTER — Encounter: Payer: Self-pay | Admitting: Family Medicine

## 2022-06-06 DIAGNOSIS — J019 Acute sinusitis, unspecified: Secondary | ICD-10-CM

## 2022-06-06 MED ORDER — AZITHROMYCIN 250 MG PO TABS: ORAL_TABLET | ORAL | 0 refills | Status: DC

## 2022-06-06 MED ORDER — BENZONATATE 200 MG PO CAPS: 200.0000 mg | ORAL_CAPSULE | Freq: Three times a day (TID) | ORAL | 0 refills | Status: DC | PRN

## 2022-06-06 NOTE — Progress Notes (Signed)
   Subjective:    Patient ID: Robert Cordova, male    DOB: 07/26/1951, 71 y.o.   MRN: [id number]  HPI Virtual Visit via Telephone Note  I connected with the patient on 06/06/22 at  9:15 AM EDT by telephone and verified that I am speaking with the correct person using two identifiers.   I discussed the limitations, risks, security and privacy concerns of performing an evaluation and management service by telephone and the availability of in person appointments. I also discussed with the patient that there may be a patient responsible charge related to this service. The patient expressed understanding and agreed to proceed.  Location patient: home Location provider: work or home office Participants present for the call: patient, provider Patient did not have a visit in the prior 7 days to address this/these issue(s).   History of Present Illness: Here for 4 days of sinus pressure, PND, blowing yellow mucus from the nose, and a dry cough. No fever or SOB. Delsym has not helped much.    Observations/Objective: Patient sounds cheerful and well on the phone. I do not appreciate any SOB. Speech and thought processing are grossly intact. Patient reported vitals:  Assessment and Plan: Sinusitis, treat with a Zpack. Use Benzonatate for cough.  Alysia Penna, MD  Follow Up Instructions:     [phone] 11-20 [phone] 21-30 I did not refer this patient for an OV in the next 24 hours for this/these issue(s).  I discussed the assessment and treatment plan with the patient. The patient was provided an opportunity to ask questions and all were answered. The patient agreed with the plan and demonstrated an understanding of the instructions.   The patient was advised to call back or seek an in-person evaluation if the symptoms worsen or if the condition fails to improve as anticipated.  I provided 14 minutes of non-face-to-face time during this encounter.   Alysia Penna, MD      Review of Systems     Objective:   Physical Exam        Assessment & Plan:

## 2022-06-13 DIAGNOSIS — H5203 Hypermetropia, bilateral: Secondary | ICD-10-CM | POA: Diagnosis not present

## 2022-06-13 LAB — HM DIABETES EYE EXAM

## 2022-07-02 ENCOUNTER — Other Ambulatory Visit: Payer: Self-pay | Admitting: Family Medicine

## 2022-07-02 DIAGNOSIS — I10 Essential (primary) hypertension: Secondary | ICD-10-CM

## 2022-08-17 ENCOUNTER — Ambulatory Visit: Payer: PPO | Admitting: Internal Medicine

## 2022-08-17 ENCOUNTER — Encounter: Payer: Self-pay | Admitting: Internal Medicine

## 2022-08-17 VITALS — BP 128/80 | HR 67 | Ht 70.0 in | Wt 218.4 lb

## 2022-08-17 DIAGNOSIS — E1165 Type 2 diabetes mellitus with hyperglycemia: Secondary | ICD-10-CM

## 2022-08-17 DIAGNOSIS — E782 Mixed hyperlipidemia: Secondary | ICD-10-CM

## 2022-08-17 LAB — POCT GLYCOSYLATED HEMOGLOBIN (HGB A1C): Hemoglobin A1C: 6.3 % — AB (ref 4.0–5.6)

## 2022-08-17 NOTE — Progress Notes (Signed)
Patient ID: Robert Cordova, male   DOB: 1951/08/01, 72 y.o.   MRN: [id number]   HPI: Robert Cordova is a 72 y.o.-year-old male, returning for f/u for DM2, dx in 2013, insulin-dependent since 04/2020, uncontrolled, with complications (h/o DKA). Last visit 4 months ago.  Interim history: He cont. to have increased urination (saw Urology and the medication was prescribed, but he developed joint pains and stopped), no blurry vision, nausea, chest pain.  Reviewed HbA1c levels: Lab Results  Component Value Date   HGBA1C 6.3 (A) 04/13/2022   HGBA1C 6.4 (A) 12/13/2021   HGBA1C 7.3 (H) 08/12/2021  08/22/2016: 7.7%  He is on: - Metformin 1000 mg 2x a day, with meals >> 2000 mg with dinner - Glipizide 10 mg after dinner >> 10 >> 5-10 mg 30 minutes before dinner  - Jardiance 25 mg before b'fast - Ozempic 0.5 mg weekly-added 09/2018 >> 1 >> 2 mg weekly - Lantus -started 04/2020 >> .Marland KitchenMarland Kitchen20-24 >> 24 >> 25 units at bedtime Of note, He was off and on insulin in the past. He came off when he lost weight after his lab band surgery in 2014 (45 pounds)  He is checking sugars 1-2 times a day: - am:  91-163 >> 75-128, 137 >> 63, 87-136, 169 >> 72-133, 144, 161 (no insulin) - 2h after b'fast: 87-125, 147 >> n/c >> 181, 215 >> n/c >> 100 - before lunch: 99-122 >> 148 >> n/c >> 148 >> n/c - 2h after lunch: n/c >> 118-215 >> n/c >> 121 >> n/c - before dinner:  119 >> 108-134 >> 94, 138, 150 >> n/c  - 2h after dinner:328 (cheese cake) >> n/c >> 208 >> n/c - bedtime:  64, 79-190, 226 >> 82-192, 202 >> 78-187, 200 - nighttime: n/c >> 212 >> n/c >> 155 Lowest sugar was 70 >> 90s >> 64 after working in the yard >> 63 >> 72; he has hypoglycemia awareness in the 70s. Highest sugar was 282 >> 226 x1 >> 203 >> 200.  Pt's meals are: - Breakfast: Protein shake - Lunch (12-1): Eggs, bacon or leftovers - Dinner: Meat + veggies + starch, pizza, [place] food - Snacks: In the evening: cookies, etc. "I snack  until I go to bed", craves sweets  -No CKD, last BUN/creatinine:  Lab Results  Component Value Date   BUN 18 08/12/2021   BUN 17 08/11/2020   CREATININE 0.72 08/12/2021   CREATININE 0.75 08/11/2020  On losartan 25 mg twice a day.  -+ HL; last set of lipids: Lab Results  Component Value Date   CHOL 132 08/12/2021   HDL 39.10 08/12/2021   LDLCALC 60 08/12/2021   LDLDIRECT 72.0 05/07/2018   TRIG 165.0 (H) 08/12/2021   CHOLHDL 3 08/12/2021  On Lipitor 20, co-Q10, Krill oil  - last eye exam was in 2023: reortedly No DR, + incipient cataract. [facility].  She sees Dr. Nicki Reaper and Dr. Katherene Ponto.  -+ Numbness in  toes.  Last foot exam 12/2021.  B12 was elevated in 01/2020-we increased his supplement dose from 5000 to 2500 mcg daily.   However, at last visit he was only on 15 mcg daily and I advised him to add 500 mcg daily.  He now takes 1000 mcg qod. Stopped B complex.  Reviewed his B12 level: Lab Results  Component Value Date   VITAMINB12 824 08/12/2021   VITAMINB12 599 11/23/2020   VITAMINB12 >1526 (H) 01/06/2020   Thyroid nodules: - s/p 2 benign biopsies in  2014  Pt denies: - feeling nodules in neck - hoarseness - dysphagia - choking  Reviewed thyroid imaging results and biopsy results: Thyroid U/S (10/20/2016): Stable 2.8 cm solid nodule in the inferior right lobe.  This was previously sampled with negative biopsy.  The 1.8 cm nodule previously sampled in the right isthmus was no longer identified by ultrasound.  Thyroid U/S (01/15/2020): COMPARISON:  10/19/2016  FINDINGS: Parenchymal Echotexture: Mildly heterogenous Isthmus: 0.9 cm Right lobe: 6.1 x 2.6 x 1.9 cm. Left lobe: 5.4 x 2.3 x 1.3 cm _________________________________________________________  Nodule # 2: Prior biopsy: Yes Location: Right; Inferior Maximum size: 3.7 cm; Other 2 dimensions: 3.1 x 2.8 cm, previously, 2.8 x 2.6 x 2.1 cm Composition: solid/almost completely solid  (2) Echogenicity: isoechoic (1) Significant change in size (>/= 20% in two dimensions and minimal increase of 2 mm): Yes  **Given size (>/= 2.5 cm) and appearance, fine needle aspiration of this mildly suspicious nodule should be considered based on TI-RADS criteria. _______________________________________________________  There is a stable small hypoechoic nodule measuring approximately 7 mm in the right mid thyroid gland.  IMPRESSION: Interval increase in size of the dominant right-sided thyroid nodule, currently measuring 3.7 cm (previously measuring 2.8 cm). While this thyroid nodule was previously biopsied, consider repeat ultrasound or fine-needle aspiration given the nodule's significant interval growth.  Thyroid FNA (01/20/2020): FINAL MICROSCOPIC DIAGNOSIS:  - Consistent with benign follicular nodule (Bethesda category II)   SPECIMEN ADEQUACY:  Satisfactory for evaluation   Thyroid U/S (12/16/2021): Parenchymal Echotexture: Moderately heterogenous  Isthmus: 2.6 cm  Right lobe: 6.1 cm x 2.1 cm x 2.1 cm  Left lobe: 5.0 cm x 2.2 cm x 1.9 cm  _________________________________________________   Nodule labeled 1 superior right thyroid 8 mm, with TR 2/cystic characteristics and does not meet criteria for surveillance.   Nodule labeled 2, lower right thyroid, 4.2 cm. Nodule has been previously biopsied. Assuming benign result, no further specific follow-up would be indicated.   Nodule labeled 3 inferior right thyroid, 1.1 cm with spongiform characteristics. Nodule does not meet criteria for further surveillance.   Nodule labeled 4, lower left thyroid, 1.4 cm. Nodule has TR 3 characteristics and does not meet criteria for surveillance.   Nodule labeled 5, lower left thyroid, 7 mm with spongiform characteristics and does not meet criteria for surveillance.   Nodule labeled 6, inferior left thyroid, 1.4 cm with TR 3 characteristics. Nodule does not meet criteria for  surveillance.   No adenopathy.   Recommendations follow those established by the new ACR TI-RADS criteria (J Am Coll Radiol 8657;84:696-295).   IMPRESSION: Redemonstration of heterogeneous/multinodular thyroid which may indicate medical thyroid disease.   Assuming a benign result of the prior biopsy of nodule labeled 2, no further specific follow-up would be indicated.  Latest TSH was normal: Lab Results  Component Value Date   TSH 0.39 08/12/2021   He also has sleep-related hypoxia, HTN. He has a history of gastric banding (11/25/2013).  Weight before surgery: 259 pounds. He has Medicare with 2 supplemental insurances.  ROS: + see HPI  I reviewed pt's medications, allergies, PMH, social hx, family hx, and changes were documented in the history of present illness. Otherwise, unchanged from my initial visit note.  Past Medical History:  Diagnosis Date   ABNORMAL GLUCOSE NEC 03/18/2007   Arthritis    fingers   BLEPHARITIS, LEFT 10/29/2009   DIABETES-TYPE 2 04/18/2010   Dry throat    Environmental allergies    GERD (gastroesophageal reflux disease)  H/O hiatal hernia    HYPERLIPIDEMIA 10/22/2007   HYPERTENSION 03/18/2007   Multiple thyroid nodules    "biopsy done, everything was fine" being monitored   NEVUS, MELANOCYTIC, FACE 10/07/2009   Past Surgical History:  Procedure Laterality Date   BIOPSY THYROID  ~08/2013   BREATH TEK H PYLORI N/A 09/05/2013   Procedure: BREATH TEK H PYLORI;  Surgeon: [name], MD;  Location: [facility];  Service: General;  Laterality: N/A;   CATARACT EXTRACTION Bilateral    COLONOSCOPY  2009, 2013   Dr. 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 30 tablet 5   atorvastatin (LIPITOR) 20 MG tablet TAKE 1 TABLET EVERY DAY 90 tablet 2   azithromycin (ZITHROMAX Z-PAK) 250 MG tablet As directed 6 each 0   benzonatate (TESSALON) 200 MG capsule Take 1 capsule (200 mg total) by mouth every 8 (eight) hours as needed for cough. 30 capsule 0   Blood Glucose Calibration (TRUE METRIX LEVEL 1) Low SOLN Use as instructed 1 each 0   Blood Glucose Monitoring Suppl (TRUE METRIX AIR GLUCOSE METER) w/Device KIT 1 Device by Other route as directed. 1 kit 0   Cholecalciferol (VITAMIN D PO) Take 50 mcg by mouth daily.     Coenzyme Q10 (CO Q-10) 200 MG CAPS Take by mouth daily.     DROPLET PEN NEEDLES 32G X 4 MM MISC USE TO INJECT ONE TIME DAILY 100 each 3   empagliflozin (JARDIANCE) 25 MG TABS tablet Take 1 tablet (25 mg total) by mouth daily. 90 tablet 3   glipiZIDE (GLUCOTROL) 5 MG tablet TAKE 1 TABLET TWICE DAILY BEFORE MEALS 180 tablet 3   insulin glargine (LANTUS SOLOSTAR) 100 UNIT/ML Solostar Pen Inject 20-24 Units into the skin at bedtime. 30 mL 3   Lidocaine 1.8 % PTCH Apply topically.     losartan (COZAAR) 50 MG tablet TAKE 1 TABLET EVERY DAY 90 tablet 1   Magnesium 250 MG TABS Take by mouth daily.      meloxicam (MOBIC) 7.5 MG tablet TAKE 1 TABLET BY MOUTH DAILY. TAKE WITH FOOD     metFORMIN (GLUCOPHAGE) 500 MG tablet TAKE 2 TABLETS TWICE DAILY WITH MEALS 360 tablet 3   minoxidil (ROGAINE) 2 % external solution Apply 1 application topically daily.  60 mL 10   Multiple Vitamins-Minerals (MULTIVITAMIN WITH MINERALS) tablet Take 1 tablet by mouth daily.     OXYGEN-HELIUM IN Inhale into the lungs.      Semaglutide, 2 MG/DOSE, 8 MG/3ML SOPN Inject 2 mg as directed once a week. 9 mL 3   triamcinolone cream (KENALOG) 0.1 %  SMARTSIG:1 Sparingly Topical Daily PRN     TRUE METRIX BLOOD GLUCOSE TEST test strip TEST BLOOD SUGAR TWICE DAILY 200 strip 0   TRUEplus Lancets 33G MISC Use as instructed two times daily 300 each 2   vitamin B-12 (CYANOCOBALAMIN) 100 MCG tablet Take 100 mcg by mouth daily.     No current facility-administered medications on file prior to visit.   Allergies  Allergen Reactions   Ace Inhibitors     REACTION: Coughing   Family History  Problem Relation Age of Onset   Hyperlipidemia Mother    Hypertension Mother    Arthritis Mother    Cancer Mother    Hyperlipidemia Father    Hypertension Father    Heart attack Father    Hyperlipidemia Brother    Diabetes Maternal Grandmother    Diabetes Maternal Grandfather    Colon cancer Neg Hx    Stomach cancer Neg Hx    Esophageal cancer Neg Hx    Rectal cancer Neg Hx     PE: BP 128/80 (BP Location: Right Arm, Patient Position: Sitting, Cuff Size: Normal)   Pulse 67   Ht '5\' 10"'$  (1.778 m)   Wt 218 lb 6.4 oz (99.1 kg)   SpO2 95%   BMI 31.34 kg/m  Wt Readings from Last 3 Encounters:  08/17/22 218 lb 6.4 oz (99.1 kg)  04/13/22 214 lb 3.2 oz (97.2 kg)  03/07/22 215 lb (97.5 kg)   Constitutional: overweight, in NAD Eyes: EOMI, no exophthalmos ENT: no thyromegaly but R inf thyroid nodule palpated, measuring approximately 2.5 cm, no cervical lymphadenopathy Cardiovascular: RRR, No MRG Respiratory: CTA B Musculoskeletal: no deformities Skin: no rashes Neurological: + Very mild tremor with outstretched hands  ASSESSMENT: 1. DM2, insulin-dependent, uncontrolled, without long term complications, but with hyperglycemia - h/o DKA  2.  Multinodular goiter  3.  HL  4.  Numbness in toes/high B12 level  PLAN:  1. Patient with longstanding, controlled, type 2 diabetes diabetic regimen with metformin, so anuria, SGLT2 inhibitor and also weekly GLP-1 receptor.  At last visit, HbA1c was 6.3%, improved, and goal.  Sugars are mostly at goal with only occasional hyperglycemic exceptions especially after dietary indiscretions.  We discussed about trying to stop the snacks at night, but otherwise I did not suggest a change in regimen. -At today's visit, sugars are mostly in the morning, with very few hyperglycemic exceptions, after dietary indiscretions at night or after forgetting to take his insulin.  At that time, sugars can be higher, up to 180s and even had 1x 200s.  He describes snacking at night, craving sweets.  States that sugars could be lower, even to 70s days after dinner, we discussed about possibly stopping glipizide before regular meals and only using it before larger meals.  I advised him to check blood sugars before and after regular meal and see if the sugars increase by 50 mg/dL.  If not, he may stay off glipizide.  Will continue the rest of the regimen for now. - I suggested to:  Patient Instructions  Please use:  - Metformin 2000 mg with dinner - Glipizide  5 mg 30 min - try to use it only before a larger dinner - Jardiance 25 mg before b'fast - Lantus 25 units at bedtime - Ozempic 2 mg weekly  Please ask your PCP to also check a B12 and TSH at the time of your annual physical exam.  Please return in 4-6 months with your sugar log  - we  checked his HbA1c: 6.3% (stable) - advised to check sugars at different times of the day - 1x a day, rotating check times - advised for yearly eye exams >> he is UTD - return to clinic in 4-6 months  2. Thyroid nodules and goiter -Nodules appears to be stable in size on the ultrasound from 2018.  However, thyroid ultrasound from 01/2020 showed an increase in the right inferior dominant nodule size from  2.8 to 3.7 cm.  FNA of this nodule was benign.  The latest neck ultrasound (12/2021) shows that nodules appears to be low risk and not worrisome.  No further follow-up is needed. -Latest TSH was normal: Lab Results  Component Value Date   TSH 0.39 08/12/2021  -Will repeat his TSH at next visit with PCP -He denies neck compression symptoms  3. HL -Reviewed latest lipid panel from 08/2021: Fractions at goal with the exception of a slightly high triglyceride level: Lab Results  Component Value Date   CHOL 132 08/12/2021   HDL 39.10 08/12/2021   LDLCALC 60 08/12/2021   LDLDIRECT 72.0 05/07/2018   TRIG 165.0 (H) 08/12/2021   CHOLHDL 3 08/12/2021  -He continues on Lipitor 20 mg daily, Krill oil, co-Q10, without side effects -We discussed about repeating his lipid panel today, but he plans to schedule an appointment with PCP and have labs then.  4.  Elevated B12 -He usually had numbness and tingling in his hands and a B12 was found to be low -We started supplementation initially with 5000 mcg daily but we decreased to 1000 mcg every other day since -B12 level was normal a year ago. -We discussed about having this repeated at next visit with PCP   Philemon Kingdom, MD PhD [facility]

## 2022-08-17 NOTE — Patient Instructions (Addendum)
Please use:  - Metformin 2000 mg with dinner - Glipizide  5 mg 30 min - try to use it only before a larger dinner - Jardiance 25 mg before b'fast - Lantus 25 units at bedtime - Ozempic 2 mg weekly  Please ask your PCP to also check a B12 and TSH at the time of your annual physical exam.  Please return in 4-6 months with your sugar log

## 2022-08-21 ENCOUNTER — Encounter: Payer: Self-pay | Admitting: Gastroenterology

## 2022-08-29 IMAGING — US US THYROID
1 series · 13 of 25 positions shown · non-contrast
Comparison: 01/14/2020

Biopsy 01/20/2020 right inferior thyroid nodule

CLINICAL DATA: 70-year-old male with follow-up of thyroid nodules

EXAM:
THYROID ULTRASOUND
TECHNIQUE: Ultrasound examination of the thyroid gland and adjacent soft
tissues was performed.

[Series 1: us thyroid · 0.06mm/px · 13 of 79 slices shown]
[im 1/79]
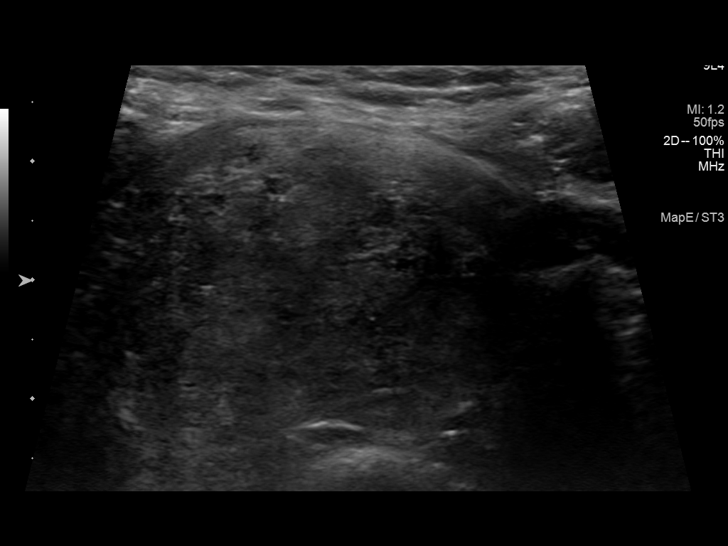
[im 7/79]
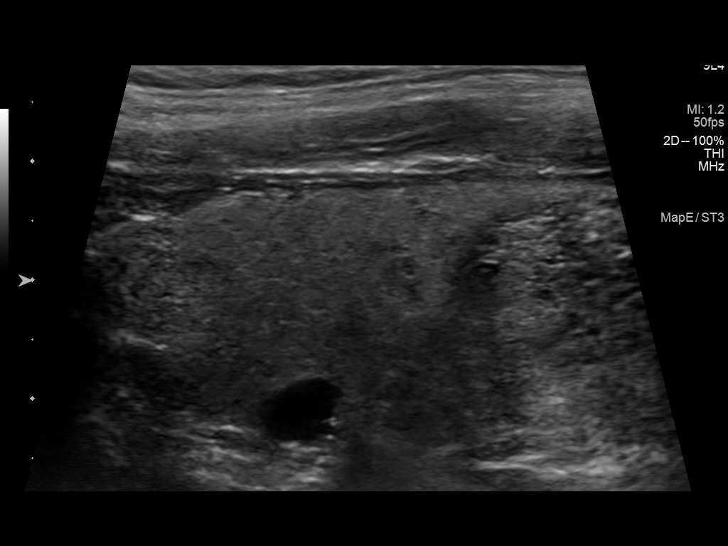
[im 14/79]
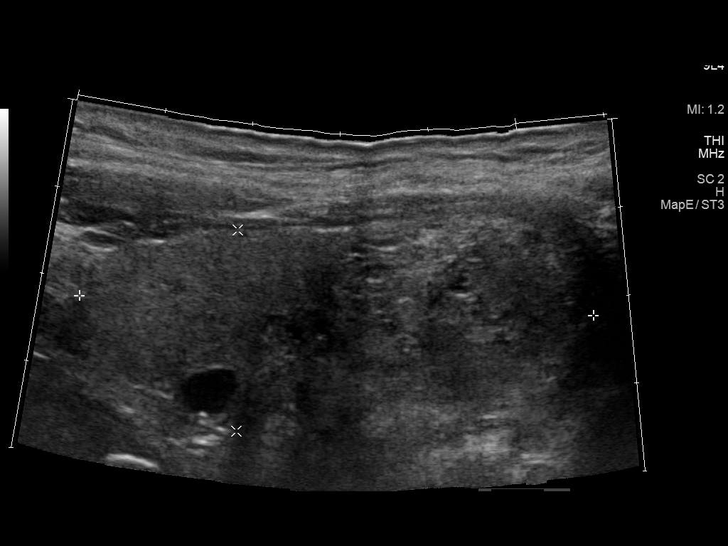
[im 20/79]
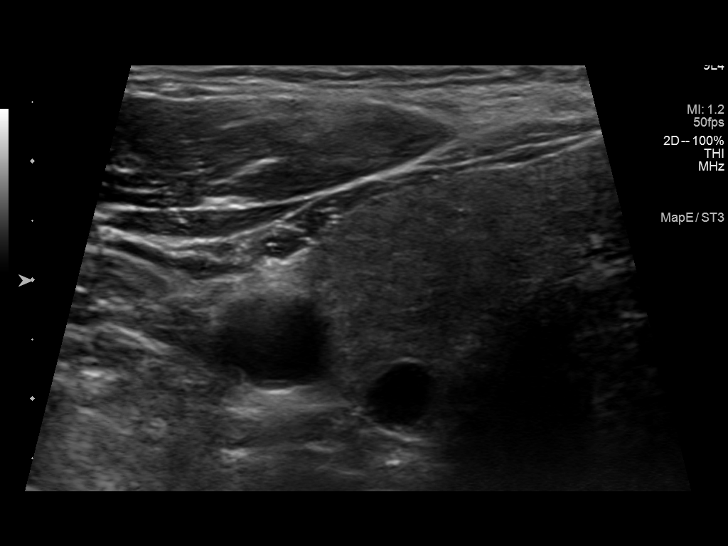
[im 27/79]
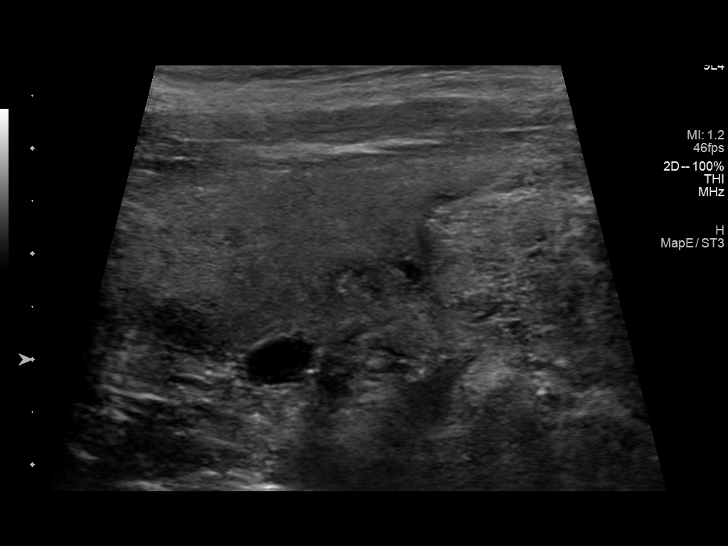
[im 33/79]
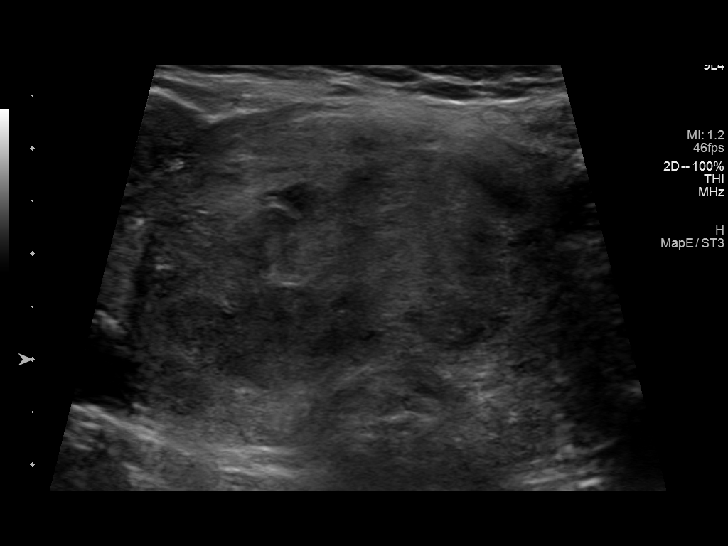
[im 40/79]
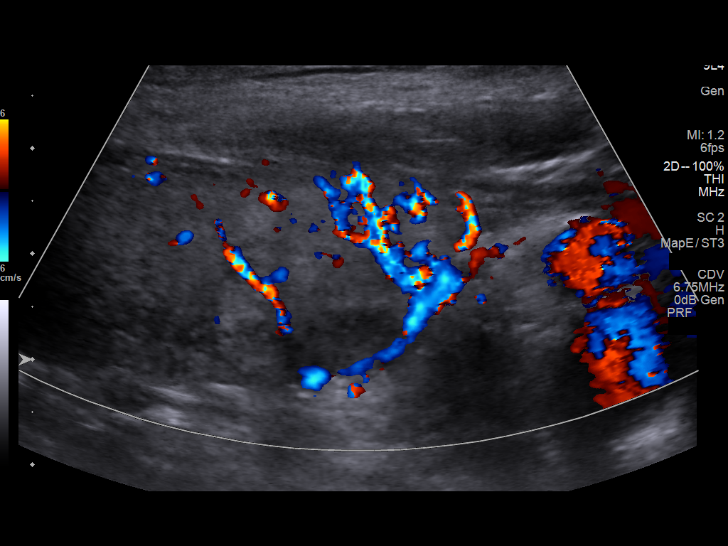
[im 46/79]
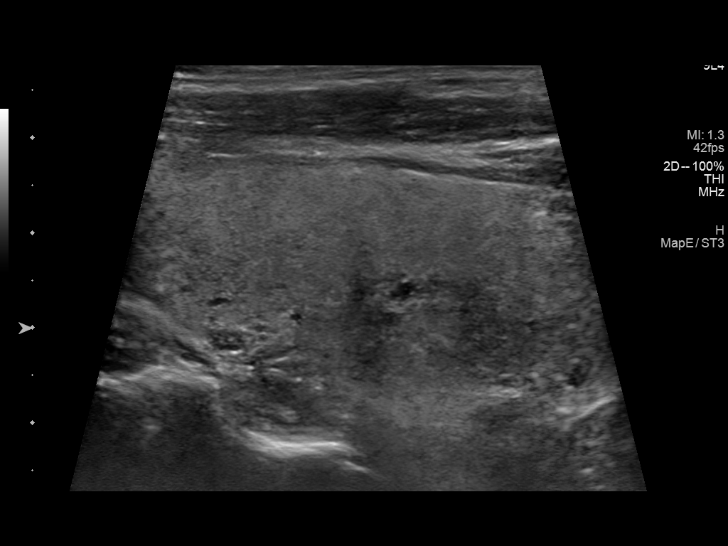
[im 53/79]
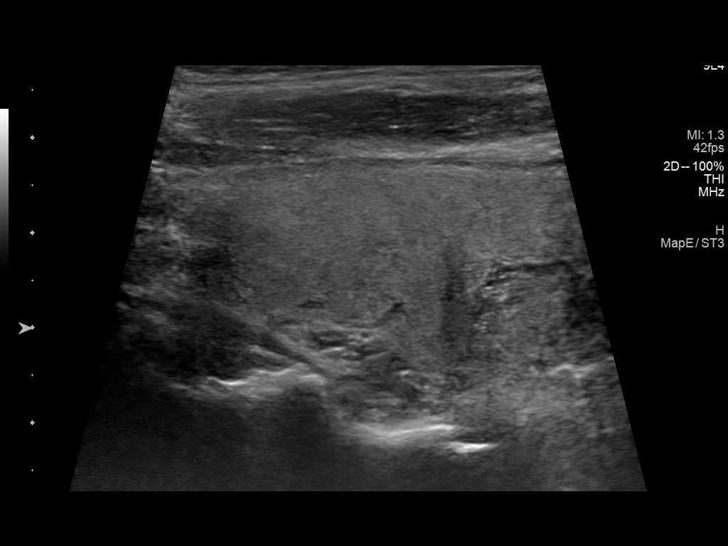
[im 59/79]
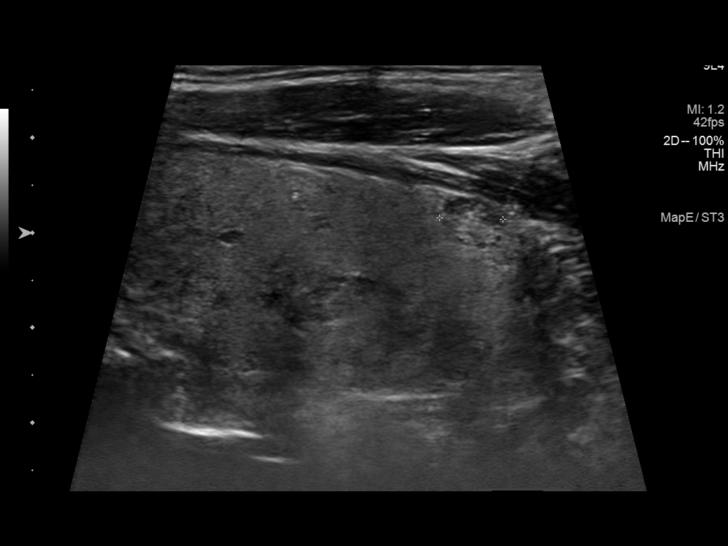
[im 66/79]
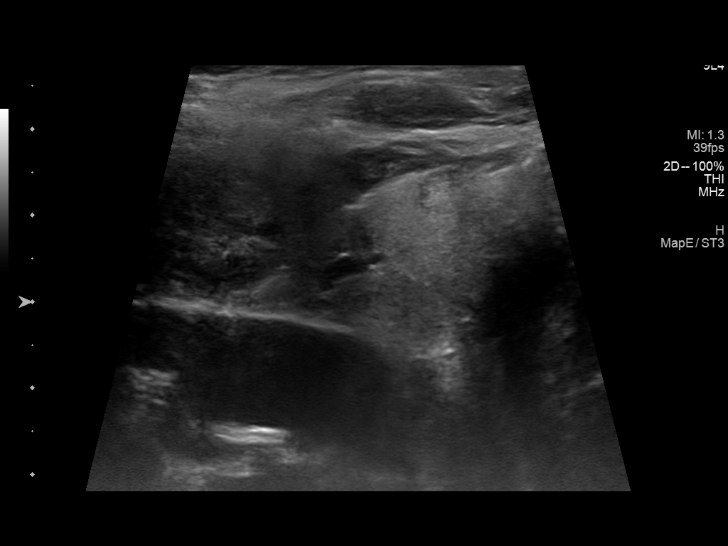
[im 72/79]
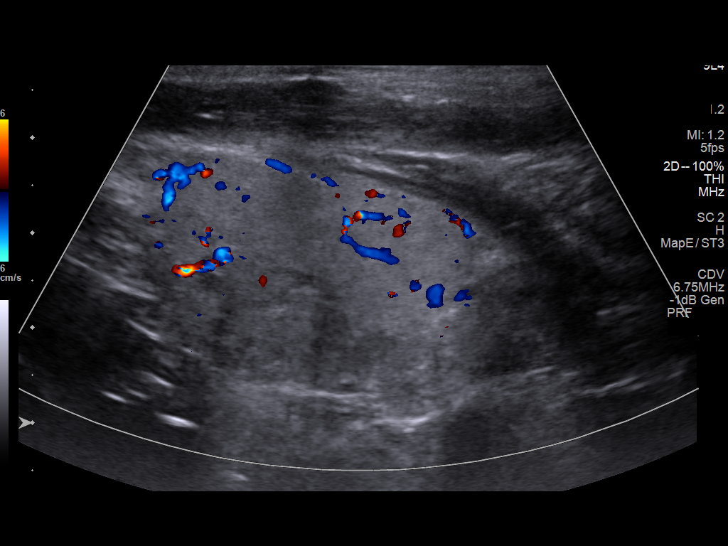
[im 79/79]
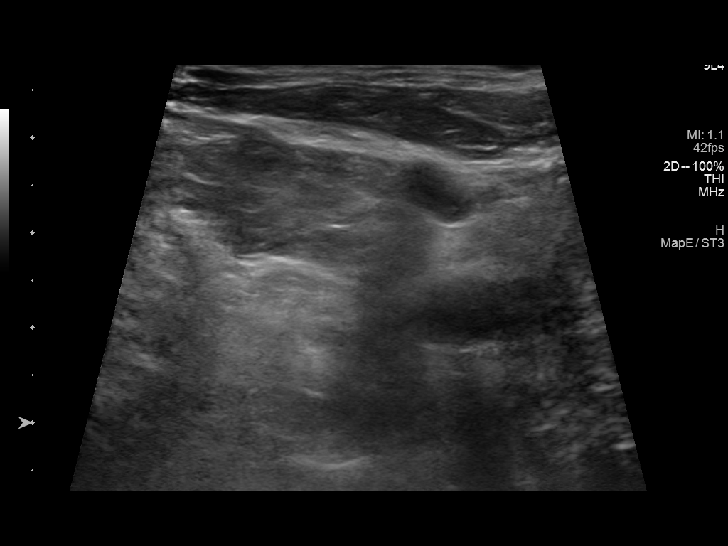

[13 of 25 positions shown; findings below may reference images not displayed]

FINDINGS: Parenchymal Echotexture: Moderately heterogenous

Isthmus: 2.6 cm

Right lobe: 6.1 cm x 2.1 cm x 2.1 cm

Left lobe: 5.0 cm x 2.2 cm x 1.9 cm

_________________________________________________________

Estimated total number of nodules >/= 1 cm: 4

Number of spongiform nodules >/=  2 cm not described below (TR1): 0

Number of mixed cystic and solid nodules >/= 1.5 cm not described
below (TR2): 0

_________________________________________________________

Nodule labeled 1 superior right thyroid 8 mm, with TR 2/cystic
characteristics and does not meet criteria for surveillance.

Nodule labeled 2, lower right thyroid, 4.2 cm. Nodule has been
previously biopsied. Assuming benign result, no further specific
follow-up would be indicated.

Nodule labeled 3 inferior right thyroid, 1.1 cm with spongiform
characteristics. Nodule does not meet criteria for further
surveillance.

Nodule labeled 4, lower left thyroid, 1.4 cm. Nodule has TR 3
characteristics and does not meet criteria for surveillance.

Nodule labeled 5, lower left thyroid, 7 mm with spongiform
characteristics and does not meet criteria for surveillance.

Nodule labeled 6, inferior left thyroid, 1.4 cm with TR 3
characteristics. Nodule does not meet criteria for surveillance.

No adenopathy.

Recommendations follow those established by the new ACR TI-RADS
criteria ([HOSPITAL] 0802;[DATE]).
IMPRESSION: Redemonstration of heterogeneous/multinodular thyroid which may
indicate medical thyroid disease.

Assuming a benign result of the prior biopsy of nodule labeled 2, no
further specific follow-up would be indicated.

## 2022-08-30 ENCOUNTER — Encounter: Payer: Self-pay | Admitting: Internal Medicine

## 2022-08-30 DIAGNOSIS — E1165 Type 2 diabetes mellitus with hyperglycemia: Secondary | ICD-10-CM

## 2022-08-30 MED ORDER — EMPAGLIFLOZIN 25 MG PO TABS: 25.0000 mg | ORAL_TABLET | Freq: Every day | ORAL | 3 refills | Status: DC

## 2022-08-30 MED ORDER — SEMAGLUTIDE (2 MG/DOSE) 8 MG/3ML ~~LOC~~ SOPN: 2.0000 mg | PEN_INJECTOR | SUBCUTANEOUS | 3 refills | Status: DC

## 2022-08-30 MED ORDER — METFORMIN HCL 500 MG PO TABS: ORAL_TABLET | ORAL | 3 refills | Status: DC

## 2022-08-30 MED ORDER — GLIPIZIDE 5 MG PO TABS: ORAL_TABLET | ORAL | 3 refills | Status: DC

## 2022-08-30 MED ORDER — DROPLET PEN NEEDLES 32G X 4 MM MISC: 3 refills | Status: DC

## 2022-08-30 MED ORDER — LANTUS SOLOSTAR 100 UNIT/ML ~~LOC~~ SOPN: 20.0000 [IU] | PEN_INJECTOR | Freq: Every day | SUBCUTANEOUS | 5 refills | Status: DC

## 2022-09-04 ENCOUNTER — Encounter: Payer: Self-pay | Admitting: Family Medicine

## 2022-09-04 ENCOUNTER — Ambulatory Visit (INDEPENDENT_AMBULATORY_CARE_PROVIDER_SITE_OTHER): Payer: PPO | Admitting: Family Medicine

## 2022-09-04 VITALS — BP 131/60 | HR 62 | Temp 97.6°F | Ht 69.75 in | Wt 213.8 lb

## 2022-09-04 DIAGNOSIS — Z0000 Encounter for general adult medical examination without abnormal findings: Secondary | ICD-10-CM

## 2022-09-04 DIAGNOSIS — N401 Enlarged prostate with lower urinary tract symptoms: Secondary | ICD-10-CM | POA: Diagnosis not present

## 2022-09-04 DIAGNOSIS — E1169 Type 2 diabetes mellitus with other specified complication: Secondary | ICD-10-CM | POA: Diagnosis not present

## 2022-09-04 DIAGNOSIS — G4734 Idiopathic sleep related nonobstructive alveolar hypoventilation: Secondary | ICD-10-CM | POA: Diagnosis not present

## 2022-09-04 DIAGNOSIS — Z794 Long term (current) use of insulin: Secondary | ICD-10-CM

## 2022-09-04 DIAGNOSIS — I10 Essential (primary) hypertension: Secondary | ICD-10-CM | POA: Diagnosis not present

## 2022-09-04 DIAGNOSIS — R351 Nocturia: Secondary | ICD-10-CM | POA: Diagnosis not present

## 2022-09-04 DIAGNOSIS — E782 Mixed hyperlipidemia: Secondary | ICD-10-CM

## 2022-09-04 DIAGNOSIS — R5383 Other fatigue: Secondary | ICD-10-CM | POA: Diagnosis not present

## 2022-09-04 LAB — LIPID PANEL
Cholesterol: 123 mg/dL (ref 0–200)
HDL: 44.9 mg/dL (ref >39.00)
LDL Cholesterol: 43 mg/dL (ref 0–99)
NonHDL: 78.25
Total CHOL/HDL Ratio: 3
Triglycerides: 177 mg/dL — ABNORMAL HIGH (ref 0.0–149.0)
VLDL: 35.4 mg/dL (ref 0.0–40.0)

## 2022-09-04 LAB — CBC WITH DIFFERENTIAL/PLATELET
Basophils Absolute: 0.1 10*3/uL (ref 0.0–0.1)
Basophils Relative: 0.9 % (ref 0.0–3.0)
Eosinophils Absolute: 0.1 10*3/uL (ref 0.0–0.7)
Eosinophils Relative: 1.8 % (ref 0.0–5.0)
HCT: 44.2 % (ref 39.0–52.0)
Hemoglobin: 15.1 g/dL (ref 13.0–17.0)
Lymphocytes Relative: 25.1 % (ref 12.0–46.0)
Lymphs Abs: 2 10*3/uL (ref 0.7–4.0)
MCHC: 34.3 g/dL (ref 30.0–36.0)
MCV: 92.6 fl (ref 78.0–100.0)
Monocytes Absolute: 0.6 10*3/uL (ref 0.1–1.0)
Monocytes Relative: 8 % (ref 3.0–12.0)
Neutro Abs: 5.1 10*3/uL (ref 1.4–7.7)
Neutrophils Relative %: 64.2 % (ref 43.0–77.0)
Platelets: 336 10*3/uL (ref 150.0–400.0)
RBC: 4.77 Mil/uL (ref 4.22–5.81)
RDW: 12.9 % (ref 11.5–15.5)
WBC: 7.9 10*3/uL (ref 4.0–10.5)

## 2022-09-04 LAB — VITAMIN D 25 HYDROXY (VIT D DEFICIENCY, FRACTURES): VITD: 35.83 ng/mL (ref 30.00–100.00)

## 2022-09-04 LAB — MICROALBUMIN / CREATININE URINE RATIO
Creatinine,U: 88.5 mg/dL
Microalb Creat Ratio: 0.8 mg/g (ref 0.0–30.0)
Microalb, Ur: <0.7 mg/dL (ref 0.0–1.9)

## 2022-09-04 LAB — COMPREHENSIVE METABOLIC PANEL
ALT: 17 U/L (ref 0–53)
AST: 12 U/L (ref 0–37)
Albumin: 4.5 g/dL (ref 3.5–5.2)
Alkaline Phosphatase: 56 U/L (ref 39–117)
BUN: 15 mg/dL (ref 6–23)
CO2: 26 mEq/L (ref 19–32)
Calcium: 10.1 mg/dL (ref 8.4–10.5)
Chloride: 103 mEq/L (ref 96–112)
Creatinine, Ser: 0.7 mg/dL (ref 0.40–1.50)
GFR: 91.58 mL/min (ref >60.00)
Glucose, Bld: 91 mg/dL (ref 70–99)
Potassium: 4.6 mEq/L (ref 3.5–5.1)
Sodium: 139 mEq/L (ref 135–145)
Total Bilirubin: 0.5 mg/dL (ref 0.2–1.2)
Total Protein: 7.2 g/dL (ref 6.0–8.3)

## 2022-09-04 LAB — PSA: PSA: 4.43 ng/mL — ABNORMAL HIGH (ref 0.10–4.00)

## 2022-09-04 LAB — HEMOGLOBIN A1C: Hgb A1c MFr Bld: 6.8 % — ABNORMAL HIGH (ref 4.6–6.5)

## 2022-09-04 LAB — VITAMIN B12: Vitamin B-12: 469 pg/mL (ref 211–911)

## 2022-09-04 LAB — TSH: TSH: 0.57 u[IU]/mL (ref 0.35–5.50)

## 2022-09-04 NOTE — Patient Instructions (Addendum)
Behavioral Health Services: -to make an appointment contact the office/provider you are interested in seeing.  No referral is needed.  The below is not an all inclusive list, but will help you get started.  Dr. Darleene Cleaver is a Psychiatrist with [facility]. [phone]  [place]  [phone] -a place in town that has counseling and Psychiatry services.     Try some of the over-the-counter cream such as Lotrimin for the area on your face.  If it remains or becomes larger schedule an appointment with your dermatologist.

## 2022-09-04 NOTE — Progress Notes (Signed)
Established Patient Office Visit   Subjective  Patient ID: Marden Tung, male    DOB: 1951-04-12  Age: 72 y.o. MRN: [id number]  Chief Complaint  Patient presents with   Annual Exam    Pt is a 72 yo male with pmh sig for BPH, HLD, DM II who presents for CPE.  Pt states he has been doing well for the most part.  Endorses increased stress dealing with his son who suffers from addiction.  Pt with fatigue, but states sleep can be affected if son calls multiple times late at night.  Pt inquires about support groups/counseling info.  Pt followed by Dr. Cruzita Lederer, endocrinology.  Had diabetic retinopathy screening 3 months ago.  Patient plans to contact gastroenterology to set up colonoscopy appointment.      ROS Negative unless stated above    Objective:     BP 131/60 (BP Location: Right Arm, Patient Position: Sitting, Cuff Size: Large)   Pulse 62   Temp 97.6 F (36.4 C) (Oral)   Ht 5' 9.75" (1.772 m)   Wt 213 lb 12.8 oz (97 kg)   SpO2 98%   BMI 30.90 kg/m    Physical Exam Constitutional:      Appearance: Normal appearance.  HENT:     Head: Normocephalic and atraumatic.     Right Ear: Tympanic membrane, ear canal and external ear normal.     Left Ear: Tympanic membrane, ear canal and external ear normal.     Nose: Nose normal.     Mouth/Throat:     Mouth: Mucous membranes are moist.     Pharynx: No oropharyngeal exudate or posterior oropharyngeal erythema.  Eyes:     General: No scleral icterus.    Extraocular Movements: Extraocular movements intact.     Conjunctiva/sclera: Conjunctivae normal.     Pupils: Pupils are equal, round, and reactive to light.  Neck:     Thyroid: No thyromegaly.  Cardiovascular:     Rate and Rhythm: Normal rate and regular rhythm.     Pulses: Normal pulses.     Heart sounds: Normal heart sounds. No murmur heard.    No friction rub.  Pulmonary:     Effort: Pulmonary effort is normal.     Breath sounds: Normal breath sounds. No  wheezing, rhonchi or rales.  Abdominal:     General: Bowel sounds are normal.     Palpations: Abdomen is soft.     Tenderness: There is no abdominal tenderness.  Musculoskeletal:        General: No deformity. Normal range of motion.  Lymphadenopathy:     Cervical: No cervical adenopathy.  Skin:    General: Skin is warm and dry.     Findings: No lesion.  Neurological:     General: No focal deficit present.     Mental Status: He is alert and oriented to person, place, and time.  Psychiatric:        Mood and Affect: Mood normal.        Thought Content: Thought content normal.        09/04/2022   10:03 AM 03/07/2022    8:34 AM 08/12/2021   10:41 AM  Depression screen PHQ 2/9  Decreased Interest 1 0 1  Down, Depressed, Hopeless 1 0 1  PHQ - 2 Score 2 0 2  Altered sleeping 1  1  Tired, decreased energy 1  1  Change in appetite 2  1  Feeling bad or failure about yourself  0  1  Trouble concentrating 2  1  Moving slowly or fidgety/restless 1  2  Suicidal thoughts 0  0  PHQ-9 Score 9  9  Difficult doing work/chores Somewhat difficult        09/04/2022   10:05 AM  GAD 7 : Generalized Anxiety Score  Nervous, Anxious, on Edge 1  Control/stop worrying 1  Worry too much - different things 1  Trouble relaxing 1  Restless 0  Easily annoyed or irritable 2  Afraid - awful might happen 1  Total GAD 7 Score 7  Anxiety Difficulty Somewhat difficult     No results found for any visits on 09/04/22.    Assessment & Plan:  Well adult exam -Anticipatory guidance given including wearing seatbelts, smoke detectors in the home, increasing physical activity, increasing p.o. intake of water and vegetables. -labs -immunizations reviewed -Pt to contact GI regarding colonoscopy.  Last colonoscopy 09/02/2012 -Next CPE in 1 year  Essential hypertension -Controlled -Continue current medications including losartan 50 mg daily -Continue lifestyle modifications -     CBC with  Differential/Platelet -     Comprehensive metabolic panel -     TSH  Type 2 diabetes mellitus with other specified complication, with long-term current use of insulin (HCC) -Controlled -Continue current medications including Jardiance 25 mg daily, glipizide 5 mg twice daily, metformin 1000 mg twice daily, semaglutide 2 mg weekly, Lantus 20-24 units nightly -Continue ARB and statin -foot exam done 12/13/21 -eye exam done 3 months ago.   Request records. -continue f/u with Endo, Dr. Cruzita Lederer -     Comprehensive metabolic panel -     Hemoglobin A1c -     Microalbumin / creatinine urine ratio  Fatigue, unspecified type -Discussed possible causes including electrolyte or vitamin deficiency.  Decreased sleep 2/2 stress and other causes likely contributing. -Increasing physical activity encouraged when able and self-care. -     Vitamin B12 -     VITAMIN D 25 Hydroxy (Vit-D Deficiency, Fractures)  Nocturnal hypoxemia -     CBC with Differential/Platelet -     Iron, TIBC and Ferritin Panel  BPH associated with nocturia -     CBC with Differential/Platelet -     PSA  Mixed hyperlipidemia -Total cholesterol 132, HDL 39.1, LDL 60, triglycerides 165, 08/12/2021 -Continue lifestyle modifications -Continue Lipitor 20 mg daily -     Comprehensive metabolic panel -     Lipid panel   Return in about 3 months (around 12/04/2022).   Billie Ruddy, MD

## 2022-09-05 ENCOUNTER — Encounter: Payer: Self-pay | Admitting: Gastroenterology

## 2022-09-05 LAB — IRON,TIBC AND FERRITIN PANEL
%SAT: 34 % (calc) (ref 20–48)
Ferritin: 170 ng/mL (ref 24–380)
Iron: 112 ug/dL (ref 50–180)
TIBC: 334 mcg/dL (calc) (ref 250–425)

## 2022-09-07 ENCOUNTER — Encounter: Payer: Self-pay | Admitting: Internal Medicine

## 2022-09-07 ENCOUNTER — Encounter: Payer: Self-pay | Admitting: Family Medicine

## 2022-09-08 ENCOUNTER — Other Ambulatory Visit: Payer: Self-pay | Admitting: Family Medicine

## 2022-09-08 DIAGNOSIS — R972 Elevated prostate specific antigen [PSA]: Secondary | ICD-10-CM

## 2022-09-12 ENCOUNTER — Telehealth: Payer: Self-pay

## 2022-09-12 NOTE — Telephone Encounter (Signed)
[employer] called to advise information is needed for PA for Ozempic. Requested a call to [phone] to complete request.

## 2022-09-13 ENCOUNTER — Other Ambulatory Visit (HOSPITAL_COMMUNITY): Payer: Self-pay

## 2022-09-13 NOTE — Telephone Encounter (Signed)
Per test claim refill is too soon and no PA is needed at this time.

## 2022-09-14 DIAGNOSIS — R3915 Urgency of urination: Secondary | ICD-10-CM | POA: Diagnosis not present

## 2022-09-14 DIAGNOSIS — N401 Enlarged prostate with lower urinary tract symptoms: Secondary | ICD-10-CM | POA: Diagnosis not present

## 2022-09-14 DIAGNOSIS — R350 Frequency of micturition: Secondary | ICD-10-CM | POA: Diagnosis not present

## 2022-09-14 DIAGNOSIS — R972 Elevated prostate specific antigen [PSA]: Secondary | ICD-10-CM | POA: Diagnosis not present

## 2022-09-18 ENCOUNTER — Other Ambulatory Visit: Payer: Self-pay | Admitting: Urology

## 2022-09-18 DIAGNOSIS — R972 Elevated prostate specific antigen [PSA]: Secondary | ICD-10-CM

## 2022-09-20 ENCOUNTER — Other Ambulatory Visit (HOSPITAL_COMMUNITY): Payer: Self-pay

## 2022-10-04 ENCOUNTER — Encounter: Payer: Self-pay | Admitting: Internal Medicine

## 2022-10-08 ENCOUNTER — Encounter: Payer: Self-pay | Admitting: Family Medicine

## 2022-10-09 DIAGNOSIS — M545 Low back pain, unspecified: Secondary | ICD-10-CM | POA: Diagnosis not present

## 2022-10-13 DIAGNOSIS — M5416 Radiculopathy, lumbar region: Secondary | ICD-10-CM | POA: Diagnosis not present

## 2022-10-16 DIAGNOSIS — M5416 Radiculopathy, lumbar region: Secondary | ICD-10-CM | POA: Diagnosis not present

## 2022-10-23 ENCOUNTER — Ambulatory Visit
Admission: RE | Admit: 2022-10-23 | Discharge: 2022-10-23 | Disposition: A | Discharge status: Home / Self Care | Payer: PPO | Source: Ambulatory Visit | Attending: Urology | Admitting: Urology

## 2022-10-23 DIAGNOSIS — R972 Elevated prostate specific antigen [PSA]: Secondary | ICD-10-CM | POA: Diagnosis not present

## 2022-10-23 MED ORDER — GADOPICLENOL 0.5 MMOL/ML IV SOLN
10.0000 mL | Freq: Once | INTRAVENOUS | Status: AC | PRN
  Administered 2022-10-23: 10 mL via INTRAVENOUS

## 2022-10-24 DIAGNOSIS — M5416 Radiculopathy, lumbar region: Secondary | ICD-10-CM | POA: Diagnosis not present

## 2022-10-26 ENCOUNTER — Encounter: Payer: Self-pay | Admitting: Cardiology

## 2022-10-31 DIAGNOSIS — M5451 Vertebrogenic low back pain: Secondary | ICD-10-CM | POA: Diagnosis not present

## 2022-11-07 DIAGNOSIS — M5451 Vertebrogenic low back pain: Secondary | ICD-10-CM | POA: Diagnosis not present

## 2022-11-14 DIAGNOSIS — M5416 Radiculopathy, lumbar region: Secondary | ICD-10-CM | POA: Diagnosis not present

## 2022-11-15 ENCOUNTER — Encounter: Payer: Self-pay | Admitting: Gastroenterology

## 2022-11-15 ENCOUNTER — Ambulatory Visit: Payer: PPO | Admitting: Gastroenterology

## 2022-11-15 VITALS — BP 124/76 | HR 64 | Ht 70.0 in | Wt 215.0 lb

## 2022-11-15 DIAGNOSIS — R972 Elevated prostate specific antigen [PSA]: Secondary | ICD-10-CM

## 2022-11-15 DIAGNOSIS — Z9981 Dependence on supplemental oxygen: Secondary | ICD-10-CM | POA: Diagnosis not present

## 2022-11-15 DIAGNOSIS — Z1211 Encounter for screening for malignant neoplasm of colon: Secondary | ICD-10-CM | POA: Diagnosis not present

## 2022-11-15 NOTE — Progress Notes (Signed)
HPI :  72 year old male with a history of remote colon polyps, nocturnal hypoxia requiring supplemental oxygen, hypertension, here to establish care to discuss colon cancer screening.  He has not been seen in our office since 2014, at the last time of his colonoscopy.  This was done by Dr. Jarold Motto was normal without any precancerous polyps.  He did have a history of 2 small adenomas in 2009.  He currently denies any problems with his bowels.  He does have some hemorrhoids which can cause some mild irritation at times but no bleeding and no blood in his stools that he endorses.  No family history of colon cancer.  He generally feels well without complaints.  He does have a history of needing supplemental oxygen at night as he has been found to have nocturnal hypoxia when he was worked up for gastric lap band years ago.  He was told he does not have OSA.  He does not use a CPAP.  He denies any cardiopulmonary symptoms at baseline.  He has no daytime supplemental oxygen need.  He does take Ozempic for history of diabetes.  No anemia, iron studies normal on labs.  Of note he had a rising PSA and an abnormal prostate on prostate MRI.  He has a biopsy scheduled next week with urology to rule out CA.  Colonoscopy January 2014: Dr. Jarold Motto - mild diverticulosis, one small descending colon polyp -   Surgical [P], descending, polyp - HYPERPLASTIC POLYP.  Colonoscopy July 2009 - two small adenomas  Echo 2019 - EF 55-60%  Past Medical History:  Diagnosis Date   ABNORMAL GLUCOSE NEC 03/18/2007   Arthritis    fingers   BLEPHARITIS, LEFT 10/29/2009   BPH (benign prostatic hyperplasia)    DIABETES-TYPE 2 04/18/2010   Dry throat    Environmental allergies    GERD (gastroesophageal reflux disease)    H/O hiatal hernia    HYPERLIPIDEMIA 10/22/2007   HYPERTENSION 03/18/2007   Multiple thyroid nodules    "biopsy done, everything was fine" being monitored   NEVUS, MELANOCYTIC, FACE 10/07/2009    Osteoarthritis of both thumbs      Past Surgical History:  Procedure Laterality Date   BIOPSY THYROID  ~08/2013   BREATH TEK H PYLORI N/A 09/05/2013   Procedure: BREATH TEK H PYLORI;  Surgeon: Valarie Merino, MD;  Location: [facility];  Service: General;  Laterality: N/A;   CATARACT EXTRACTION Bilateral    COLONOSCOPY  2009, 2013   Dr. Jarold Motto; multiple polyps   HIATAL HERNIA REPAIR  11/25/2013   Procedure: LAPAROSCOPIC REPAIR OF HIATAL HERNIA;  Surgeon: Valarie Merino, MD;  Location: [facility];  Service: General;;   LAPAROSCOPIC GASTRIC BANDING N/A 11/25/2013   Procedure: LAPAROSCOPIC GASTRIC BANDING;  Surgeon: Valarie Merino, MD;  Location: [facility];  Service: General;  Laterality: N/A;   NO PAST SURGERIES     Family History  Problem Relation Age of Onset   Hyperlipidemia Mother    Hypertension Mother    Arthritis Mother    Cancer Mother    Hyperlipidemia Father    Hypertension Father    Heart attack Father    Hyperlipidemia Brother    Diabetes Maternal Grandmother    Diabetes Maternal Grandfather    Colon cancer Neg Hx    Stomach cancer Neg Hx    Esophageal cancer Neg Hx    Rectal cancer Neg Hx    Social History   Tobacco Use   Smoking status: Never  Passive exposure: Past   Smokeless tobacco: Never  Vaping Use   Vaping Use: Never used  Substance Use Topics   Alcohol use: No   Drug use: No   Current Outpatient Medications  Medication Sig Dispense Refill   aspirin 81 MG tablet Take 1 tablet (81 mg total) by mouth daily. 30 tablet 5   atorvastatin (LIPITOR) 20 MG tablet TAKE 1 TABLET EVERY DAY 90 tablet 2   Blood Glucose Calibration (TRUE METRIX LEVEL 1) Low SOLN Use as instructed 1 each 0   Blood Glucose Monitoring Suppl (TRUE METRIX AIR GLUCOSE METER) w/Device KIT 1 Device by Other route as directed. 1 kit 0   Cholecalciferol (VITAMIN D PO) Take 50 mcg by mouth daily.     Coenzyme Q10 (CO Q-10) 200 MG CAPS Take by mouth daily.     empagliflozin  (JARDIANCE) 25 MG TABS tablet Take 1 tablet (25 mg total) by mouth daily. 90 tablet 3   glipiZIDE (GLUCOTROL) 5 MG tablet TAKE 1 TABLET TWICE DAILY BEFORE MEALS 180 tablet 3   insulin glargine (LANTUS SOLOSTAR) 100 UNIT/ML Solostar Pen Inject 20-24 Units into the skin at bedtime. 15 mL 5   Insulin Pen Needle (DROPLET PEN NEEDLES) 32G X 4 MM MISC USE TO INJECT ONE TIME DAILY 100 each 3   Lidocaine 1.8 % PTCH Apply topically.     losartan (COZAAR) 50 MG tablet TAKE 1 TABLET EVERY DAY 90 tablet 1   Magnesium 250 MG TABS Take by mouth daily.      meloxicam (MOBIC) 7.5 MG tablet TAKE 1 TABLET BY MOUTH DAILY. TAKE WITH FOOD     metFORMIN (GLUCOPHAGE) 500 MG tablet TAKE 2 TABLETS TWICE DAILY WITH MEALS 360 tablet 3   minoxidil (ROGAINE) 2 % external solution Apply 1 application topically daily. 60 mL 10   Multiple Vitamins-Minerals (MULTIVITAMIN WITH MINERALS) tablet Take 1 tablet by mouth daily.     OXYGEN-HELIUM IN Inhale into the lungs.      Semaglutide, 2 MG/DOSE, 8 MG/3ML SOPN Inject 2 mg as directed once a week. 9 mL 3   triamcinolone cream (KENALOG) 0.1 % SMARTSIG:1 Sparingly Topical Daily PRN     TRUE METRIX BLOOD GLUCOSE TEST test strip TEST BLOOD SUGAR TWICE DAILY 200 strip 0   TRUEplus Lancets 33G MISC Use as instructed two times daily 300 each 2   vitamin B-12 (CYANOCOBALAMIN) 100 MCG tablet Take 100 mcg by mouth daily.     No current facility-administered medications for this visit.   Allergies  Allergen Reactions   Ace Inhibitors     REACTION: Coughing     Review of Systems: All systems reviewed and negative except where noted in HPI.    MR PROSTATE W WO CONTRAST  Result Date: 10/24/2022 CLINICAL DATA:  Elevated PSA level.  R97.20 EXAM: MR PROSTATE WITHOUT AND WITH CONTRAST TECHNIQUE: Multiplanar multisequence MRI images were obtained of the pelvis centered about the prostate. Pre and post contrast images were obtained. CONTRAST:  10 cc [facility]way COMPARISON:  None Available.  FINDINGS: Prostate: Encapsulated nodularity in the transition zone compatible with benign prostatic hypertrophy. Region of interest # 1: PI-RADS category 4 lesion of the bilateral posteromedial peripheral zone near the midline, with focally reduced T2 signal (image 53, series 9) corresponding to reduced ADC map activity and restricted diffusion (image 20 of series 6 and 7). This measures 1.2 by 0.6 by 0.9 cm (0.40 cc) Volume: 3D volumetric analysis: Prostate volume 52.26 cc (5.6 by 4.0 by 4.9  cm). Transcapsular spread:  Absent Seminal vesicle involvement: Absent Neurovascular bundle involvement: Absent Pelvic adenopathy: Absent Bone metastasis: Absent Other findings: 3.7 by 1.2 by 1.6 cm ganglion cyst versus paralabral cyst extending just below the left hip joint as shown on image 16 series 8. IMPRESSION: 1. PI-RADS category 4 lesion of the bilateral posteromedial peripheral zone near the midline. Targeting data sent to [facility]. 2. Encapsulated nodularity in the transition zone compatible with benign prostatic hypertrophy. 3. 3.7 by 1.2 by 1.6 cm ganglion cyst versus paralabral cyst extending just below the left hip joint. Electronically Signed   By: Gaylyn Rong M.D.   On: 10/24/2022 13:31    Lab Results  Component Value Date   WBC 7.9 09/04/2022   HGB 15.1 09/04/2022   HCT 44.2 09/04/2022   MCV 92.6 09/04/2022   PLT 336.0 09/04/2022    Lab Results  Component Value Date   IRON 112 09/04/2022   TIBC 334 09/04/2022   FERRITIN 170 09/04/2022     Physical Exam: BP 124/76   Pulse 64   Ht 5\' 10"  (1.778 m)   Wt 215 lb (97.5 kg)   BMI 30.85 kg/m  Constitutional: Pleasant,well-developed, male in no acute distress. HEENT: Normocephalic and atraumatic. Conjunctivae are normal. No scleral icterus. Neck supple.  Cardiovascular: Normal rate, regular rhythm.  Pulmonary/chest: Effort normal and breath sounds normal.  Abdominal: Soft, nondistended, nontender. There are no masses palpable. No  hepatomegaly. Extremities: no edema Lymphadenopathy: No cervical adenopathy noted. Neurological: Alert and oriented to person place and time. Skin: Skin is warm and dry. No rashes noted. Psychiatric: Normal mood and affect. Behavior is normal.   ASSESSMENT: 72 y.o. male here for assessment of the following  1. Colon cancer screening   2. Oxygen dependent   3. Abnormal prostate specific antigen    Patient is at average risk for colon cancer, is overdue for colon cancer screening.  Discussed options with him.  He has had a history of remote adenomas years ago, I think optical colonoscopy is best way to survey and screen him for colon cancer.  We discussed what colonoscopy entails, risks and benefits of the procedure and anesthesia.  Given his supplemental oxygen use, while he has no exertional symptoms and no need for daytime use, he does have documented desaturation at nighttime leading to supplemental oxygen use, of unclear etiology, his case should be done at the hospital with anesthesia support.  He is agreeable to this the question is timing of his exam.  The patient has an abnormal prostate on imaging and rising PSA, is scheduled for a prostate biopsy next week to further evaluate.  He would like to have that done and results back first to clarify if he has prostate cancer or not and what his treatment course would entail prior to committing to colonoscopy.  I counseled him that if he does have prostate cancer and need radiation or surgery, we will try to coordinate his colonoscopy around timing of that.  He is agreeable, he will let me know results of his prostate biopsy and follow-up with [place] in the next few weeks to discuss plan.  PLAN: - colonoscopy at hospital due to oxygen use at night and anesthesia support(unclear indication or underlying reason) - no OSA - will await prostate biopsy scheduled for next week - if no cancer and only observation pending results can do colonoscopy in next  few months - if he has prostate cancer will await treatment recommendations and perform his colonoscopy  around when would be appropriate in that light  Harlin Rain, MD [facility]  CC: Deeann Saint, MD

## 2022-11-15 NOTE — Patient Instructions (Addendum)
Please contact [facility] once you have your prostate biopsy to discuss timing of colonoscopy at the hospital.  Thank you for entrusting me with your care and for choosing [facility], Dr. Ileene Patrick    If your blood pressure at your visit was 140/90 or greater, please contact your primary care physician to follow up on this.  _______________________________________________________  If you are age 74 or older, your body mass index should be between 23-30. Your Body mass index is 30.85 kg/m. If this is out of the aforementioned range listed, please consider follow up with your Primary Care Provider.  If you are age 12 or younger, your body mass index should be between 19-25. Your Body mass index is 30.85 kg/m. If this is out of the aformentioned range listed, please consider follow up with your Primary Care Provider.   ________________________________________________________  The [facility] providers would like to encourage you to use [facility] to communicate with providers for non-urgent requests or questions.  Due to long hold times on the telephone, sending your provider a message by [facility] may be a faster and more efficient way to get a response.  Please allow 48 business hours for a response.  Please remember that this is for non-urgent requests.  _______________________________________________________  Due to recent changes in healthcare laws, you may see the results of your imaging and laboratory studies on MyChart before your provider has had a chance to review them.  We understand that in some cases there may be results that are confusing or concerning to you. Not all laboratory results come back in the same time frame and the provider may be waiting for multiple results in order to interpret others.  Please give [facility] 48 hours in order for your provider to thoroughly review all the results before contacting the office for clarification of your results.

## 2022-11-16 ENCOUNTER — Telehealth: Payer: Self-pay | Admitting: Family Medicine

## 2022-11-16 NOTE — Telephone Encounter (Signed)
Contacted [facility] to schedule their annual wellness visit. Appointment made for 11/29/22.  Rudell Cobb AWV direct phone # [phone]

## 2022-11-22 DIAGNOSIS — N4289 Other specified disorders of prostate: Secondary | ICD-10-CM | POA: Diagnosis not present

## 2022-11-22 DIAGNOSIS — C61 Malignant neoplasm of prostate: Secondary | ICD-10-CM | POA: Diagnosis not present

## 2022-11-22 DIAGNOSIS — R972 Elevated prostate specific antigen [PSA]: Secondary | ICD-10-CM | POA: Diagnosis not present

## 2022-11-29 ENCOUNTER — Ambulatory Visit (INDEPENDENT_AMBULATORY_CARE_PROVIDER_SITE_OTHER): Payer: PPO

## 2022-11-29 VITALS — Ht 70.0 in | Wt 215.0 lb

## 2022-11-29 DIAGNOSIS — Z0000 Encounter for general adult medical examination without abnormal findings: Secondary | ICD-10-CM

## 2022-11-29 DIAGNOSIS — N401 Enlarged prostate with lower urinary tract symptoms: Secondary | ICD-10-CM | POA: Diagnosis not present

## 2022-11-29 DIAGNOSIS — R350 Frequency of micturition: Secondary | ICD-10-CM | POA: Diagnosis not present

## 2022-11-29 DIAGNOSIS — C61 Malignant neoplasm of prostate: Secondary | ICD-10-CM | POA: Diagnosis not present

## 2022-11-29 NOTE — Progress Notes (Signed)
Subjective:   Robert Cordova is a 72 y.o. male who presents for Medicare Annual/Subsequent preventive examination.  Review of Systems    Virtual Visit via Telephone Note  I connected with  Robert Cordova on 11/29/22 at 12:30 PM EDT by telephone and verified that I am speaking with the correct person using two identifiers.  Location: Patient: Home Provider: Office Persons participating in the virtual visit: patient/Nurse Health Advisor   I discussed the limitations, risks, security and privacy concerns of performing an evaluation and management service by telephone and the availability of in person appointments. The patient expressed understanding and agreed to proceed.  Interactive audio and video telecommunications were attempted between this nurse and patient, however failed, due to patient having technical difficulties OR patient did not have access to video capability.  We continued and completed visit with audio only.  Some vital signs may be absent or patient reported.   [name], LPN  Cardiac Risk Factors include: advanced age (>76men, >36 women);male gender;diabetes mellitus;hypertension     Objective:    Today's Vitals   11/29/22 1244  Weight: 215 lb (97.5 kg)  Height: 5\' 10"  (1.778 m)   Body mass index is 30.85 kg/m.     11/29/2022    1:00 PM 03/07/2022    8:33 AM 03/03/2021    2:02 PM 02/23/2021    9:01 AM 02/27/2020    9:19 AM 04/29/2017    6:16 AM 08/15/2016   11:41 AM  Advanced Directives  Does Patient Have a Medical Advance Directive? No No Yes No No No No  Type of Advance Directive   Healthcare Power of Attorney      Does patient want to make changes to medical advance directive?   No - Patient declined      Copy of Healthcare Power of Attorney in Chart?   No - copy requested      Would patient like information on creating a medical advance directive? No - Patient declined  No - Patient declined Yes (MAU/Ambulatory/Procedural Areas -  Information given) No - Patient declined No - Patient declined     Current Medications (verified) Outpatient Encounter Medications as of 11/29/2022  Medication Sig   aspirin 81 MG tablet Take 1 tablet (81 mg total) by mouth daily.   atorvastatin (LIPITOR) 20 MG tablet TAKE 1 TABLET EVERY DAY   Blood Glucose Calibration (TRUE METRIX LEVEL 1) Low SOLN Use as instructed   Blood Glucose Monitoring Suppl (TRUE METRIX AIR GLUCOSE METER) w/Device KIT 1 Device by Other route as directed.   Cholecalciferol (VITAMIN D PO) Take 50 mcg by mouth daily.   Coenzyme Q10 (CO Q-10) 200 MG CAPS Take by mouth daily.   empagliflozin (JARDIANCE) 25 MG TABS tablet Take 1 tablet (25 mg total) by mouth daily.   glipiZIDE (GLUCOTROL) 5 MG tablet TAKE 1 TABLET TWICE DAILY BEFORE MEALS   insulin glargine (LANTUS SOLOSTAR) 100 UNIT/ML Solostar Pen Inject 20-24 Units into the skin at bedtime.   Insulin Pen Needle (DROPLET PEN NEEDLES) 32G X 4 MM MISC USE TO INJECT ONE TIME DAILY   Lidocaine 1.8 % PTCH Apply topically.   losartan (COZAAR) 50 MG tablet TAKE 1 TABLET EVERY DAY   Magnesium 250 MG TABS Take by mouth daily.    meloxicam (MOBIC) 7.5 MG tablet TAKE 1 TABLET BY MOUTH DAILY. TAKE WITH FOOD   metFORMIN (GLUCOPHAGE) 500 MG tablet TAKE 2 TABLETS TWICE DAILY WITH MEALS   minoxidil (ROGAINE) 2 % external  solution Apply 1 application topically daily.   Multiple Vitamins-Minerals (MULTIVITAMIN WITH MINERALS) tablet Take 1 tablet by mouth daily.   OXYGEN-HELIUM IN Inhale into the lungs.    Semaglutide, 2 MG/DOSE, 8 MG/3ML SOPN Inject 2 mg as directed once a week.   triamcinolone cream (KENALOG) 0.1 % SMARTSIG:1 Sparingly Topical Daily PRN   TRUE METRIX BLOOD GLUCOSE TEST test strip TEST BLOOD SUGAR TWICE DAILY   TRUEplus Lancets 33G MISC Use as instructed two times daily   vitamin B-12 (CYANOCOBALAMIN) 100 MCG tablet Take 100 mcg by mouth daily.   No facility-administered encounter medications on file as of 11/29/2022.     Allergies (verified) Ace inhibitors   History: Past Medical History:  Diagnosis Date   ABNORMAL GLUCOSE NEC 03/18/2007   Arthritis    fingers   BLEPHARITIS, LEFT 10/29/2009   BPH (benign prostatic hyperplasia)    DIABETES-TYPE 2 04/18/2010   Dry throat    Environmental allergies    GERD (gastroesophageal reflux disease)    H/O hiatal hernia    HYPERLIPIDEMIA 10/22/2007   HYPERTENSION 03/18/2007   Multiple thyroid nodules    "biopsy done, everything was fine" being monitored   NEVUS, MELANOCYTIC, FACE 10/07/2009   Osteoarthritis of both thumbs    Past Surgical History:  Procedure Laterality Date   BIOPSY THYROID  ~08/2013   BREATH TEK H PYLORI N/A 09/05/2013   Procedure: BREATH TEK H PYLORI;  Surgeon: Valarie Merino, MD;  Location: [facility];  Service: General;  Laterality: N/A;   CATARACT EXTRACTION Bilateral    COLONOSCOPY  2009, 2013   Dr. Jarold Motto; multiple polyps   HIATAL HERNIA REPAIR  11/25/2013   Procedure: LAPAROSCOPIC REPAIR OF HIATAL HERNIA;  Surgeon: Valarie Merino, MD;  Location: [facility];  Service: General;;   LAPAROSCOPIC GASTRIC BANDING N/A 11/25/2013   Procedure: LAPAROSCOPIC GASTRIC BANDING;  Surgeon: Valarie Merino, MD;  Location: [facility];  Service: General;  Laterality: N/A;   NO PAST SURGERIES     Family History  Problem Relation Age of Onset   Hyperlipidemia Mother    Hypertension Mother    Arthritis Mother    Cancer Mother    Hyperlipidemia Father    Hypertension Father    Heart attack Father    Hyperlipidemia Brother    Diabetes Maternal Grandmother    Diabetes Maternal Grandfather    Colon cancer Neg Hx    Stomach cancer Neg Hx    Esophageal cancer Neg Hx    Rectal cancer Neg Hx    Social History   Socioeconomic History   Marital status: Married    Spouse name: Robert Cordova   Number of children: 2   Years of education: 16   Highest education level: Bachelor's degree (e.g., BA, AB, BS)  Occupational History    Occupation: Research scientist (medical)-- self-employeed   Occupation: retired  Tobacco Use   Smoking status: Never    Passive exposure: Past   Smokeless tobacco: Never  Vaping Use   Vaping Use: Never used  Substance and Sexual Activity   Alcohol use: No   Drug use: No   Sexual activity: Not Currently    Partners: Female  Other Topics Concern   Not on file  Social History Narrative   Not on file   Social Determinants of Health   Financial Resource Strain: Low Risk  (11/29/2022)   Overall Financial Resource Strain (CARDIA)    Difficulty of Paying Living Expenses: Not hard at all  Food Insecurity: No Food  Insecurity (11/29/2022)   Hunger Vital Sign    Worried About Running Out of Food in the Last Year: Never true    Ran Out of Food in the Last Year: Never true  Transportation Needs: No Transportation Needs (11/29/2022)   PRAPARE - Administrator, Civil Service (Medical): No    Lack of Transportation (Non-Medical): No  Physical Activity: Insufficiently Active (11/29/2022)   Exercise Vital Sign    Days of Exercise per Week: 2 days    Minutes of Exercise per Session: 60 min  Stress: No Stress Concern Present (11/29/2022)   [employer] of Occupational Health - Occupational Stress Questionnaire    Feeling of Stress : Not at all  Social Connections: Moderately Isolated (11/29/2022)   Social Connection and Isolation Panel [NHANES]    Frequency of Communication with Friends and Family: More than three times a week    Frequency of Social Gatherings with Friends and Family: More than three times a week    Attends Religious Services: Never    Database administrator or Organizations: No    Attends Engineer, structural: Never    Marital Status: Married    Tobacco Counseling Counseling given: Not Answered   Clinical Intake:  Pre-visit preparation completed: Yes  Pain : No/denies pain     BMI - recorded: 30.85 Nutritional Status: BMI > 30  Obese Nutritional Risks:  None Diabetes: Yes CBG done?: Yes (CBG 120 Taken by patient) CBG resulted in Enter/ Edit results?: Yes Did pt. bring in CBG monitor from home?: No  How often do you need to have someone help you when you read instructions, pamphlets, or other written materials from your doctor or pharmacy?: 1 - Never  Diabetic?  Yes  Interpreter Needed?: NoNutrition Risk Assessment:  Has the patient had any N/V/D within the last 2 months?  No Does the patient have any non-healing wounds?  No  Has the patient had any unintentional weight loss or weight gain?  No   Diabetes:  Is the patient diabetic?  Yes  If diabetic, was a CBG obtained today?  Yes CBG 120 Taken by patient Did the patient bring in their glucometer from home?  No  How often do you monitor your CBG's? PRN.   Financial Strains and Diabetes Management:  Are you having any financial strains with the device, your supplies or your medication? No .  Does the patient want to be seen by Chronic Care Management for management of their diabetes?  No  Would the patient like to be referred to a Nutritionist or for Diabetic Management?  No   Diabetic Exams:  Diabetic Eye Exam: Completed . Overdue for diabetic eye exam. Pt has been advised about the importance in completing this exam. A referral has been placed today. Message sent to referral coordinator for scheduling purposes. Advised pt to expect a call from office referred to regarding appt.  Diabetic Foot Exam: Completed . Pt has been advised about the importance in completing this exam. Pt is scheduled for diabetic foot exam on Followed by Dr Elvera Lennox  Information entered by :: Theresa Mulligan LPN   Activities of Daily Living    11/29/2022   12:58 PM 11/27/2022    7:44 PM  In your present state of health, do you have any difficulty performing the following activities:  Hearing? 0 0  Vision? 0 0  Difficulty concentrating or making decisions? 0 0  Walking or climbing stairs? 0 0   Dressing  or bathing? 0 0  Doing errands, shopping? 0 0  Preparing Food and eating ? N N  Using the Toilet? N N  In the past six months, have you accidently leaked urine? Malvin Johns  Comment Wears pads. Followed by Urologist   Do you have problems with loss of bowel control? N N  Managing your Medications? N N  Managing your Finances? N N  Housekeeping or managing your Housekeeping? N N    Patient Care Team: Deeann Saint, MD as PCP - General (Family Medicine) Georgeanna Lea, MD as PCP - Cardiology (Cardiology)  Indicate any recent Medical Services you may have received from other than [facility] providers in the past year (date may be approximate).     Assessment:   This is a routine wellness examination for Celeste.  Hearing/Vision screen Hearing Screening - Comments:: Denies hearing difficulties   Vision Screening - Comments:: Wears rx glasses - up to date with routine eye exams with  Dr Lorin Picket  Dietary issues and exercise activities discussed: Current Exercise Habits: Home exercise routine, Type of exercise: walking, Time (Minutes): 60, Frequency (Times/Week): 3, Weekly Exercise (Minutes/Week): 180, Intensity: Moderate, Exercise limited by: None identified   Goals Addressed               This Visit's Progress     No current goals        No current goals (pt-stated)         Depression Screen    11/29/2022   12:53 PM 09/04/2022   10:03 AM 03/07/2022    8:34 AM 08/12/2021   10:41 AM 03/03/2021    2:01 PM 02/23/2021    8:57 AM 02/27/2020    9:25 AM  PHQ 2/9 Scores  PHQ - 2 Score 0 2 0 2 1 1  0  PHQ- 9 Score  9  9   0    Fall Risk    11/29/2022   12:59 PM 11/27/2022    7:44 PM 09/04/2022   10:02 AM 03/07/2022    8:34 AM 08/12/2021   10:41 AM  Fall Risk   Falls in the past year? 0 0 0 0 0  Number falls in past yr: 0 0 0 0   Injury with Fall? 0 0 0 0   Risk for fall due to : No Fall Risks  No Fall Risks Medication side effect   Follow up Falls prevention discussed  Falls  evaluation completed Falls evaluation completed;Education provided;Falls prevention discussed     FALL RISK PREVENTION PERTAINING TO THE HOME:  Any stairs in or around the home? Yes  If so, are there any without handrails? No  Home free of loose throw rugs in walkways, pet beds, electrical cords, etc? Yes  Adequate lighting in your home to reduce risk of falls? Yes   ASSISTIVE DEVICES UTILIZED TO PREVENT FALLS:  Life alert? No  Use of a cane, walker or w/c? No  Grab bars in the bathroom? No  Shower chair or bench in shower? Yes Elevated toilet seat or a handicapped toilet? No   TIMED UP AND GO:  Was the test performed? No . Audio Visit   Cognitive Function:        11/29/2022    1:00 PM 03/07/2022    8:37 AM 02/23/2021    9:05 AM 02/27/2020    9:32 AM 08/15/2016   11:50 AM  6CIT Screen  What Year? 0 points 0 points 0 points 0 points 0 points  What month? 0 points 0 points 0 points 0 points 0 points  What time? 0 points 0 points 0 points 0 points 0 points  Count back from 20 0 points 0 points 0 points 0 points 0 points  Months in reverse 0 points 0 points 0 points 0 points 0 points  Repeat phrase 0 points 2 points 0 points 2 points 0 points  Total Score 0 points 2 points 0 points 2 points 0 points    Immunizations Immunization History  Administered Date(s) Administered   Fluad Quad(high Dose 65+) 04/25/2019, 05/20/2020   Influenza Split 07/13/2011, 05/15/2012   Influenza Whole 05/09/2008, 05/10/2009, 04/18/2010   Influenza, High Dose Seasonal PF 05/30/2016, 04/30/2018   Influenza,inj,Quad PF,6+ Mos 04/22/2013, 05/07/2014, 05/07/2015, 05/15/2017   Influenza-Unspecified 06/21/2021, 04/12/2022   PFIZER(Purple Top)SARS-COV-2 Vaccination 08/20/2019, 09/09/2019, 05/08/2020, 03/05/2021, 07/08/2021   Pfizer Covid-19 Vaccine Bivalent Booster 36yrs & up 05/17/2022   Pneumococcal Conjugate-13 05/07/2014   Pneumococcal Polysaccharide-23 06/22/2015   Td 08/07/2002, 08/07/2006    Tdap 05/07/2014   Zoster, Live 01/30/2011    TDAP status: Up to date  Flu Vaccine status: Up to date  Pneumococcal vaccine status: Up to date  Covid-19 vaccine status: Completed vaccines  Qualifies for Shingles Vaccine? Yes   Zostavax completed No   Shingrix Completed?: No.    Education has been provided regarding the importance of this vaccine. Patient has been advised to call insurance company to determine out of pocket expense if they have not yet received this vaccine. Advised may also receive vaccine at local pharmacy or Health Dept. Verbalized acceptance and understanding.  Screening Tests Health Maintenance  Topic Date Due   COVID-19 Vaccine (7 - 2023-24 season) 12/04/2022 (Originally 07/12/2022)   Zoster Vaccines- Shingrix (1 of 2) 12/04/2022 (Originally 06/08/1970)   COLONOSCOPY (Pts 45-13yrs Insurance coverage will need to be confirmed)  11/29/2023 (Originally 09/02/2017)   FOOT EXAM  12/14/2022   HEMOGLOBIN A1C  03/05/2023   INFLUENZA VACCINE  03/08/2023   OPHTHALMOLOGY EXAM  06/14/2023   Diabetic kidney evaluation - eGFR measurement  09/05/2023   Diabetic kidney evaluation - Urine ACR  09/05/2023   Medicare Annual Wellness (AWV)  11/29/2023   DTaP/Tdap/Td (4 - Td or Tdap) 05/07/2024   Pneumonia Vaccine 47+ Years old  Completed   Hepatitis C Screening  Completed   HPV VACCINES  Aged Out    Health Maintenance  There are no preventive care reminders to display for this patient.   Colorectal cancer screening: Referral to GI placed Deferred. Pt aware the office will call re: appt.  Lung Cancer Screening: (Low Dose CT Chest recommended if Age 64-80 years, 30 pack-year currently smoking OR have quit w/in 15years.) does not qualify.     Additional Screening:  Hepatitis C Screening: does qualify; Completed 02/15/16  Vision Screening: Recommended annual ophthalmology exams for early detection of glaucoma and other disorders of the eye. Is the patient up to date with  their annual eye exam?  Yes  Who is the provider or what is the name of the office in which the patient attends annual eye exams? Dr Lorin Picket If pt is not established with a provider, would they like to be referred to a provider to establish care? No .   Dental Screening: Recommended annual dental exams for proper oral hygiene  Community Resource Referral / Chronic Care Management:  CRR required this visit?  No   CCM required this visit?  No      Plan:  I have personally reviewed and noted the following in the patient's chart:   Medical and social history Use of alcohol, tobacco or illicit drugs  Current medications and supplements including opioid prescriptions. Patient is not currently taking opioid prescriptions. Functional ability and status Nutritional status Physical activity Advanced directives List of other physicians Hospitalizations, surgeries, and ER visits in previous 12 months Vitals Screenings to include cognitive, depression, and falls Referrals and appointments  In addition, I have reviewed and discussed with patient certain preventive protocols, quality metrics, and best practice recommendations. A written personalized care plan for preventive services as well as general preventive health recommendations were provided to patient.     [name], LPN   [id number]/61/0960   Nurse Notes:  None

## 2022-11-29 NOTE — Patient Instructions (Addendum)
Robert Cordova , Thank you for taking time to come for your Medicare Wellness Visit. I appreciate your ongoing commitment to your health goals. Please review the following plan we discussed and let me know if I can assist you in the future.   These are the goals we discussed:  Goals       No current goals      No current goals (pt-stated)      patient      May increase exercise; has equipment in home;  Has a treadmill; has rowing machine and may try this       Patient Stated      I will continue to take my medications as prescribed       Patient Stated      None at this time      Patient Stated      03/07/2022, stay alive      Weight (lb) < 215 lb (97.5 kg)      Exercise more now  Check out  online nutrition programs as WikiBlast.com.cy and LimitLaws.com.cy; fit8me; Look for foods with "whole" wheat; bran; oatmeal etc Shot at the farmer's markets in season for fresher choices  Watch for "hydrogenated" on the label of oils which are trans-fats.  Watch for "high fructose corn syrup" in snacks, yogurt or ketchup  Meats have less marbling; bright colored fruits and vegetables;  Canned; dump out liquid and wash vegetables. Be mindful of what we are eating  Portion control is essential to a health weight! Sit down; take a break and enjoy your meal; take smaller bites; put the fork down between bites;  It takes 20 minutes to get full; so check in with your fullness cues and stop eating when you start to fill full               This is a list of the screening recommended for you and due dates:  Health Maintenance  Topic Date Due   COVID-19 Vaccine (7 - 2023-24 season) 12/04/2022*   Zoster (Shingles) Vaccine (1 of 2) 12/04/2022*   Colon Cancer Screening  11/29/2023*   Complete foot exam   12/14/2022   Hemoglobin A1C  03/05/2023   Flu Shot  03/08/2023   Eye exam for diabetics  06/14/2023   Yearly kidney function blood test for diabetes  09/05/2023   Yearly kidney health  urinalysis for diabetes  09/05/2023   Medicare Annual Wellness Visit  11/29/2023   DTaP/Tdap/Td vaccine (4 - Td or Tdap) 05/07/2024   Pneumonia Vaccine  Completed   Hepatitis C Screening: USPSTF Recommendation to screen - Ages 65-79 yo.  Completed   HPV Vaccine  Aged Out  *Topic was postponed. The date shown is not the original due date.    Advanced directives: Advance directive discussed with you today. Even though you declined this today, please call our office should you change your mind, and we can give you the proper paperwork for you to fill out.   Conditions/risks identified: None  Next appointment: Follow up in one year for your annual wellness visit.   Preventive Care 19 Years and Older, Male  Preventive care refers to lifestyle choices and visits with your health care provider that can promote health and wellness. What does preventive care include? A yearly physical exam. This is also called an annual well check. Dental exams once or twice a year. Routine eye exams. Ask your health care provider how often you should have your eyes checked. Personal  lifestyle choices, including: Daily care of your teeth and gums. Regular physical activity. Eating a healthy diet. Avoiding tobacco and drug use. Limiting alcohol use. Practicing safe sex. Taking low doses of aspirin every day. Taking vitamin and mineral supplements as recommended by your health care provider. What happens during an annual well check? The services and screenings done by your health care provider during your annual well check will depend on your age, overall health, lifestyle risk factors, and family history of disease. Counseling  Your health care provider may ask you questions about your: Alcohol use. Tobacco use. Drug use. Emotional well-being. Home and relationship well-being. Sexual activity. Eating habits. History of falls. Memory and ability to understand (cognition). Work and work  Astronomer. Screening  You may have the following tests or measurements: Height, weight, and BMI. Blood pressure. Lipid and cholesterol levels. These may be checked every 5 years, or more frequently if you are over 57 years old. Skin check. Lung cancer screening. You may have this screening every year starting at age 74 if you have a 30-pack-year history of smoking and currently smoke or have quit within the past 15 years. Fecal occult blood test (FOBT) of the stool. You may have this test every year starting at age 57. Flexible sigmoidoscopy or colonoscopy. You may have a sigmoidoscopy every 5 years or a colonoscopy every 10 years starting at age 16. Prostate cancer screening. Recommendations will vary depending on your family history and other risks. Hepatitis C blood test. Hepatitis B blood test. Sexually transmitted disease (STD) testing. Diabetes screening. This is done by checking your blood sugar (glucose) after you have not eaten for a while (fasting). You may have this done every 1-3 years. Abdominal aortic aneurysm (AAA) screening. You may need this if you are a current or former smoker. Osteoporosis. You may be screened starting at age 36 if you are at high risk. Talk with your health care provider about your test results, treatment options, and if necessary, the need for more tests. Vaccines  Your health care provider may recommend certain vaccines, such as: Influenza vaccine. This is recommended every year. Tetanus, diphtheria, and acellular pertussis (Tdap, Td) vaccine. You may need a Td booster every 10 years. Zoster vaccine. You may need this after age 74. Pneumococcal 13-valent conjugate (PCV13) vaccine. One dose is recommended after age 51. Pneumococcal polysaccharide (PPSV23) vaccine. One dose is recommended after age 80. Talk to your health care provider about which screenings and vaccines you need and how often you need them. This information is not intended to replace  advice given to you by your health care provider. Make sure you discuss any questions you have with your health care provider. Document Released: 08/20/2015 Document Revised: 04/12/2016 Document Reviewed: 05/25/2015 Elsevier Interactive Patient Education  2017 [employer].  Fall Prevention in the Home Falls can cause injuries. They can happen to people of all ages. There are many things you can do to make your home safe and to help prevent falls. What can I do on the outside of my home? Regularly fix the edges of walkways and driveways and fix any cracks. Remove anything that might make you trip as you walk through a door, such as a raised step or threshold. Trim any bushes or trees on the path to your home. Use bright outdoor lighting. Clear any walking paths of anything that might make someone trip, such as rocks or tools. Regularly check to see if handrails are loose or broken. Make sure  that both sides of any steps have handrails. Any raised decks and porches should have guardrails on the edges. Have any leaves, snow, or ice cleared regularly. Use sand or salt on walking paths during winter. Clean up any spills in your garage right away. This includes oil or grease spills. What can I do in the bathroom? Use night lights. Install grab bars by the toilet and in the tub and shower. Do not use towel bars as grab bars. Use non-skid mats or decals in the tub or shower. If you need to sit down in the shower, use a plastic, non-slip stool. Keep the floor dry. Clean up any water that spills on the floor as soon as it happens. Remove soap buildup in the tub or shower regularly. Attach bath mats securely with double-sided non-slip rug tape. Do not have throw rugs and other things on the floor that can make you trip. What can I do in the bedroom? Use night lights. Make sure that you have a light by your bed that is easy to reach. Do not use any sheets or blankets that are too big for your bed.  They should not hang down onto the floor. Have a firm chair that has side arms. You can use this for support while you get dressed. Do not have throw rugs and other things on the floor that can make you trip. What can I do in the kitchen? Clean up any spills right away. Avoid walking on wet floors. Keep items that you use a lot in easy-to-reach places. If you need to reach something above you, use a strong step stool that has a grab bar. Keep electrical cords out of the way. Do not use floor polish or wax that makes floors slippery. If you must use wax, use non-skid floor wax. Do not have throw rugs and other things on the floor that can make you trip. What can I do with my stairs? Do not leave any items on the stairs. Make sure that there are handrails on both sides of the stairs and use them. Fix handrails that are broken or loose. Make sure that handrails are as long as the stairways. Check any carpeting to make sure that it is firmly attached to the stairs. Fix any carpet that is loose or worn. Avoid having throw rugs at the top or bottom of the stairs. If you do have throw rugs, attach them to the floor with carpet tape. Make sure that you have a light switch at the top of the stairs and the bottom of the stairs. If you do not have them, ask someone to add them for you. What else can I do to help prevent falls? Wear shoes that: Do not have high heels. Have rubber bottoms. Are comfortable and fit you well. Are closed at the toe. Do not wear sandals. If you use a stepladder: Make sure that it is fully opened. Do not climb a closed stepladder. Make sure that both sides of the stepladder are locked into place. Ask someone to hold it for you, if possible. Clearly mark and make sure that you can see: Any grab bars or handrails. First and last steps. Where the edge of each step is. Use tools that help you move around (mobility aids) if they are needed. These  include: Canes. Walkers. Scooters. Crutches. Turn on the lights when you go into a dark area. Replace any light bulbs as soon as they burn out. Set up your furniture  so you have a clear path. Avoid moving your furniture around. If any of your floors are uneven, fix them. If there are any pets around you, be aware of where they are. Review your medicines with your doctor. Some medicines can make you feel dizzy. This can increase your chance of falling. Ask your doctor what other things that you can do to help prevent falls. This information is not intended to replace advice given to you by your health care provider. Make sure you discuss any questions you have with your health care provider. Document Released: 05/20/2009 Document Revised: 12/30/2015 Document Reviewed: 08/28/2014 Elsevier Interactive Patient Education  2017 [employer].

## 2022-12-06 ENCOUNTER — Ambulatory Visit (INDEPENDENT_AMBULATORY_CARE_PROVIDER_SITE_OTHER): Payer: PPO | Admitting: Family Medicine

## 2022-12-06 ENCOUNTER — Encounter: Payer: Self-pay | Admitting: Family Medicine

## 2022-12-06 ENCOUNTER — Ambulatory Visit: Payer: PPO | Admitting: Family Medicine

## 2022-12-06 VITALS — BP 138/72 | HR 68 | Temp 98.4°F | Wt 217.6 lb

## 2022-12-06 DIAGNOSIS — I10 Essential (primary) hypertension: Secondary | ICD-10-CM | POA: Diagnosis not present

## 2022-12-06 DIAGNOSIS — Z7984 Long term (current) use of oral hypoglycemic drugs: Secondary | ICD-10-CM

## 2022-12-06 DIAGNOSIS — R350 Frequency of micturition: Secondary | ICD-10-CM | POA: Diagnosis not present

## 2022-12-06 DIAGNOSIS — Z794 Long term (current) use of insulin: Secondary | ICD-10-CM

## 2022-12-06 DIAGNOSIS — C61 Malignant neoplasm of prostate: Secondary | ICD-10-CM | POA: Diagnosis not present

## 2022-12-06 DIAGNOSIS — Z7985 Long-term (current) use of injectable non-insulin antidiabetic drugs: Secondary | ICD-10-CM

## 2022-12-06 DIAGNOSIS — F439 Reaction to severe stress, unspecified: Secondary | ICD-10-CM

## 2022-12-06 DIAGNOSIS — E1169 Type 2 diabetes mellitus with other specified complication: Secondary | ICD-10-CM | POA: Diagnosis not present

## 2022-12-06 DIAGNOSIS — R0982 Postnasal drip: Secondary | ICD-10-CM

## 2022-12-06 NOTE — Progress Notes (Signed)
Established Patient Office Visit   Subjective  Patient ID: Robert Cordova, male    DOB: 1951/04/06  Age: 72 y.o. MRN: [id number]  Chief Complaint  Patient presents with   Follow-up    Pushed back colonoscopy, for colon biopsy, mom had a stroke.    Pt is a 72 yo male seen for follow-up on chronic concerns.  Patient endorses increased stress.  His mother is currently in the hospital 2/2 stroke.  Pt still receiving phone calls requesting money from his son who is dealing with a drug addiction.  Patient notes thick mucus in throat, intermittent sneezing times a few weeks.  Has not tried anything for symptoms.  Denies fever, chills, ear pain/pressure, facial pain/pressure, coughing.   Patient had prostate biopsy, stage I prostate cancer noted.  Taking Jardiance.  Urology is to repeat PSA in 6 months then repeat biopsy in several more months.  Pt postponed colonoscopy 2/2 ongoing health concerns/family issues.      ROS Negative unless stated above    Objective:     BP 138/72 (BP Location: Left Arm, Patient Position: Sitting, Cuff Size: Normal)   Pulse 68   Temp 98.4 F (36.9 C) (Oral)   Wt 217 lb 9.6 oz (98.7 kg)   SpO2 95%   BMI 31.22 kg/m    Physical Exam Constitutional:      General: He is not in acute distress.    Appearance: Normal appearance.  HENT:     Head: Normocephalic and atraumatic.     Comments: Postnasal drainage    Ears:     Comments: Bilateral TMs full, without erythema or suppurative fluid.    Nose: Nose normal.     Mouth/Throat:     Mouth: Mucous membranes are moist.  Eyes:     Extraocular Movements: Extraocular movements intact.     Conjunctiva/sclera: Conjunctivae normal.  Cardiovascular:     Rate and Rhythm: Normal rate and regular rhythm.     Heart sounds: Normal heart sounds. No murmur heard.    No gallop.  Pulmonary:     Effort: Pulmonary effort is normal. No respiratory distress.     Breath sounds: Normal breath sounds. No wheezing,  rhonchi or rales.  Skin:    General: Skin is warm and dry.  Neurological:     Mental Status: He is alert and oriented to person, place, and time.      No results found for any visits on 12/06/22.    Assessment & Plan:  Prostate cancer (HCC) -PSA 4.4 on 09/04/2022 -Elevated PSA noted during CPE -Stage I prostate cancer noted after prostate biopsy -Continue follow-up with urology  Urinary frequency -Likely 2/2 BPH and/or prostate cancer.  Jardiance may also be contributing. -Continue current medications -Continue follow-up with urology  Stress -Consider restarting counseling  Post-nasal drainage -OTC antihistamine such as Allegra.  Essential hypertension -Controlled -Continue lifestyle modifications -Losartan 50 mg daily  Type 2 diabetes mellitus with other specified complication, with long-term current use of insulin (HCC) -Hemoglobin A1c 6.8% on 09/04/2022 -Continue current medications including glipizide 5 mg twice daily, Jardiance 25 mg, metformin 1000 mg twice daily, Ozempic 2 mg weekly, Lantus 20-24 units nightly -Continue follow-up with endocrinology -Continue ARB and statin.  Allergy discussed ACE I    On day of service, 31 minutes spent caring for this patient face-to-face, reviewing the chart, counseling and/or coordinating care for plan and treatment of diagnosis below.    Return if symptoms worsen or fail to improve.  Billie Ruddy, MD

## 2022-12-21 ENCOUNTER — Telehealth: Payer: Self-pay | Admitting: Gastroenterology

## 2022-12-21 NOTE — Telephone Encounter (Signed)
Received MyChart message from patient requesting to schedule his colonoscopy.  He had an OV with Dr. Adela Lank 11/15/22.  As he is oxygen-dependent, this procedure will need to be done at the hospital.  Please call patient and advise. Thank you.

## 2022-12-21 NOTE — Telephone Encounter (Signed)
[facility] note scanned into media for review. Pt is already on your hospital wait list.

## 2022-12-21 NOTE — Telephone Encounter (Signed)
Okay thanks. Can schedule in next available opening. He has prostate cancer but they are going to monitor, no plans for surgery or radiation right now. Thanks

## 2022-12-26 ENCOUNTER — Other Ambulatory Visit: Payer: Self-pay

## 2022-12-26 DIAGNOSIS — Z9981 Dependence on supplemental oxygen: Secondary | ICD-10-CM

## 2022-12-26 DIAGNOSIS — R972 Elevated prostate specific antigen [PSA]: Secondary | ICD-10-CM

## 2022-12-26 DIAGNOSIS — Z1211 Encounter for screening for malignant neoplasm of colon: Secondary | ICD-10-CM

## 2022-12-26 NOTE — Telephone Encounter (Signed)
MyChart message sent to patient to see if he can do colonoscopy on Thursday 7-18

## 2022-12-26 NOTE — Progress Notes (Signed)
Patient scheduled for colon at [facility] on Thursday, 7-18 at 8:30am to arrive at 7am. Case #[id number].  PV scheduled for 7-3 at 9am

## 2023-01-23 ENCOUNTER — Telehealth: Payer: Self-pay | Admitting: Pulmonary Disease

## 2023-01-23 NOTE — Telephone Encounter (Signed)
[facility] needs visit notes and certificate of medical ness.  FAX is [phone]

## 2023-01-24 ENCOUNTER — Ambulatory Visit (INDEPENDENT_AMBULATORY_CARE_PROVIDER_SITE_OTHER): Payer: PPO | Admitting: Adult Health

## 2023-01-24 ENCOUNTER — Encounter: Payer: Self-pay | Admitting: Adult Health

## 2023-01-24 VITALS — BP 120/64 | HR 63 | Temp 97.8°F | Ht 70.0 in | Wt 215.2 lb

## 2023-01-24 DIAGNOSIS — G4734 Idiopathic sleep related nonobstructive alveolar hypoventilation: Secondary | ICD-10-CM | POA: Diagnosis not present

## 2023-01-24 NOTE — Assessment & Plan Note (Signed)
Nocturnal hypoxemia well-controlled on nocturnal oxygen.  Continue on oxygen at bedtime.  Overnight oximetry test on room air per insurance requirements.  Plan  Patient Instructions  Set up for Overnight oximetry testing on room air .  Continue on Oxygen 2.5l/m At bedtime  .  Saline nasal gel At bedtime   Follow up with Dr. Francine Graven in 1 year and As needed

## 2023-01-24 NOTE — Telephone Encounter (Signed)
I have checked [employer] there is no CMN for this patient I have faxed the note from 01/24/23 to [phone]

## 2023-01-24 NOTE — Patient Instructions (Addendum)
Set up for Overnight oximetry testing on room air .  Continue on Oxygen 2.5l/m At bedtime  .  Saline nasal gel At bedtime   Follow up with Dr. Francine Graven in 1 year and As needed

## 2023-01-24 NOTE — Progress Notes (Signed)
@Patient  ID: Robert Cordova, male    DOB: [birth year], 72 y.o.   MRN: [id number]  Chief Complaint  Patient presents with   Follow-up    Referring provider: Deeann Saint, MD  HPI: 72 year old male never smoker followed for sleep-related hypoxemia. Nocturnal hypoxemia workup: Negative sleep study 2014 AHI 3.3/hour Chest x-ray clear Normal PFTs Started on nocturnal oxygen 2014  TEST/EVENTS :  ONO 03/2022 spent 40 min 49 sec with SpO2 less than 88%.   01/24/2023 Follow up ; nocturnal hypoxemia Patient returns for 1 year follow-up.  Patient has known nocturnal hypoxemia.  Previous workup showed a negative sleep study.  Chest x-ray was clear.  Normal pulmonary function testing.  Patient has been on oxygen for the last 10 years.  Overnight oximetry test in August 2023 showed ongoing nocturnal desaturations and was recommended continue on oxygen at 2.5 L at bedtime.  Patient says that his insurance is requiring another overnight oximetry test on room air to cover his ongoing oxygen use. Patient says overall he is doing well.  Says he wears his oxygen every night feels that he benefits from oxygen.  Allergies  Allergen Reactions   Ace Inhibitors     REACTION: Coughing    Immunization History  Administered Date(s) Administered   Fluad Quad(high Dose 65+) 04/25/2019, 05/20/2020   Influenza Split 07/13/2011, 05/15/2012   Influenza Whole 05/09/2008, 05/10/2009, 04/18/2010   Influenza, High Dose Seasonal PF 05/30/2016, 04/30/2018   Influenza,inj,Quad PF,6+ Mos 04/22/2013, 05/07/2014, 05/07/2015, 05/15/2017   Influenza-Unspecified 06/21/2021, 04/12/2022   PFIZER(Purple Top)SARS-COV-2 Vaccination 08/20/2019, 09/09/2019, 05/08/2020, 03/05/2021, 07/08/2021   Pfizer Covid-19 Vaccine Bivalent Booster 75yrs & up 05/17/2022   Pneumococcal Conjugate-13 05/07/2014   Pneumococcal Polysaccharide-23 06/22/2015   Td 08/07/2002, 08/07/2006   Tdap 05/07/2014   Zoster, Live 01/30/2011     Past Medical History:  Diagnosis Date   ABNORMAL GLUCOSE NEC 03/18/2007   Arthritis    fingers   BLEPHARITIS, LEFT 10/29/2009   BPH (benign prostatic hyperplasia)    DIABETES-TYPE 2 04/18/2010   Dry throat    Environmental allergies    GERD (gastroesophageal reflux disease)    H/O hiatal hernia    HYPERLIPIDEMIA 10/22/2007   HYPERTENSION 03/18/2007   Multiple thyroid nodules    "biopsy done, everything was fine" being monitored   NEVUS, MELANOCYTIC, FACE 10/07/2009   Osteoarthritis of both thumbs     Tobacco History: Social History   Tobacco Use  Smoking Status Never   Passive exposure: Past  Smokeless Tobacco Never   Counseling given: Not Answered   Outpatient Medications Prior to Visit  Medication Sig Dispense Refill   aspirin 81 MG tablet Take 1 tablet (81 mg total) by mouth daily. 30 tablet 5   atorvastatin (LIPITOR) 20 MG tablet TAKE 1 TABLET EVERY DAY 90 tablet 2   Blood Glucose Calibration (TRUE METRIX LEVEL 1) Low SOLN Use as instructed 1 each 0   Blood Glucose Monitoring Suppl (TRUE METRIX AIR GLUCOSE METER) w/Device KIT 1 Device by Other route as directed. 1 kit 0   Cholecalciferol (VITAMIN D PO) Take 50 mcg by mouth daily.     Coenzyme Q10 (CO Q-10) 200 MG CAPS Take by mouth daily.     empagliflozin (JARDIANCE) 25 MG TABS tablet Take 1 tablet (25 mg total) by mouth daily. 90 tablet 3   glipiZIDE (GLUCOTROL) 5 MG tablet TAKE 1 TABLET TWICE DAILY BEFORE MEALS 180 tablet 3   insulin glargine (LANTUS SOLOSTAR) 100 UNIT/ML Solostar Pen Inject 20-24 Units  into the skin at bedtime. 15 mL 5   Insulin Pen Needle (DROPLET PEN NEEDLES) 32G X 4 MM MISC USE TO INJECT ONE TIME DAILY 100 each 3   Lidocaine 1.8 % PTCH Apply topically.     losartan (COZAAR) 50 MG tablet TAKE 1 TABLET EVERY DAY 90 tablet 1   Magnesium 250 MG TABS Take by mouth daily.      meloxicam (MOBIC) 7.5 MG tablet TAKE 1 TABLET BY MOUTH DAILY. TAKE WITH FOOD     metFORMIN (GLUCOPHAGE) 500 MG tablet  TAKE 2 TABLETS TWICE DAILY WITH MEALS 360 tablet 3   minoxidil (ROGAINE) 2 % external solution Apply 1 application topically daily. 60 mL 10   Multiple Vitamins-Minerals (MULTIVITAMIN WITH MINERALS) tablet Take 1 tablet by mouth daily.     OXYGEN-HELIUM IN Inhale into the lungs.      Semaglutide, 2 MG/DOSE, 8 MG/3ML SOPN Inject 2 mg as directed once a week. 9 mL 3   triamcinolone cream (KENALOG) 0.1 % SMARTSIG:1 Sparingly Topical Daily PRN     TRUE METRIX BLOOD GLUCOSE TEST test strip TEST BLOOD SUGAR TWICE DAILY 200 strip 0   TRUEplus Lancets 33G MISC Use as instructed two times daily 300 each 2   vitamin B-12 (CYANOCOBALAMIN) 100 MCG tablet Take 100 mcg by mouth daily.     No facility-administered medications prior to visit.     Review of Systems:   Constitutional:   No  weight loss, night sweats,  Fevers, chills, fatigue, or  lassitude.  HEENT:   No headaches,  Difficulty swallowing,  Tooth/dental problems, or  Sore throat,                No sneezing, itching, ear ache, nasal congestion, post nasal drip,   CV:  No chest pain,  Orthopnea, PND, swelling in lower extremities, anasarca, dizziness, palpitations, syncope.   GI  No heartburn, indigestion, abdominal pain, nausea, vomiting, diarrhea, change in bowel habits, loss of appetite, bloody stools.   Resp: No shortness of breath with exertion or at rest.  No excess mucus, no productive cough,  No non-productive cough,  No coughing up of blood.  No change in color of mucus.  No wheezing.  No chest wall deformity  Skin: no rash or lesions.  GU: no dysuria, change in color of urine, no urgency or frequency.  No flank pain, no hematuria   MS:  No joint pain or swelling.  No decreased range of motion.  No back pain.    Physical Exam  BP 120/64 (BP Location: Left Arm, Patient Position: Sitting, Cuff Size: Normal)   Pulse 63   Temp 97.8 F (36.6 C) (Oral)   Ht 5\' 10"  (1.778 m)   Wt 215 lb 3.2 oz (97.6 kg)   SpO2 99%   BMI 30.88  kg/m   GEN: A/Ox3; pleasant , NAD, well nourished    HEENT:  [place]/AT,  NOSE-clear, THROAT-clear, no lesions, no postnasal drip or exudate noted.   NECK:  Supple w/ fair ROM; no JVD; normal carotid impulses w/o bruits; no thyromegaly or nodules palpated; no lymphadenopathy.    RESP  Clear  P & A; w/o, wheezes/ rales/ or rhonchi. no accessory muscle use, no dullness to percussion  CARD:  RRR, no m/r/g, no peripheral edema, pulses intact, no cyanosis or clubbing.  GI:   Soft & nt; nml bowel sounds; no organomegaly or masses detected.   Musco: Warm bil, no deformities or joint swelling noted.   Neuro:  alert, no focal deficits noted.    Skin: Warm, no lesions or rashes    Lab Results:      BNP No results found for: "BNP"    Imaging: No results found.       Latest Ref Rng & Units 06/17/2015   12:48 PM  PFT Results  FVC-Pre L 4.62   FVC-Predicted Pre % 97   FVC-Post L 4.52   FVC-Predicted Post % 95   Pre FEV1/FVC % % 80   Post FEV1/FCV % % 86   FEV1-Pre L 3.69   FEV1-Predicted Pre % 104   FEV1-Post L 3.89   DLCO uncorrected ml/min/mmHg 29.01   DLCO UNC% % 86   DLVA Predicted % 96     No results found for: "NITRICOXIDE"      Assessment & Plan:   Nocturnal hypoxemia Nocturnal hypoxemia well-controlled on nocturnal oxygen.  Continue on oxygen at bedtime.  Overnight oximetry test on room air per insurance requirements.  Plan  Patient Instructions  Set up for Overnight oximetry testing on room air .  Continue on Oxygen 2.5l/m At bedtime  .  Saline nasal gel At bedtime   Follow up with Dr. Francine Graven in 1 year and As needed        Rubye Oaks, NP 01/24/2023

## 2023-01-25 ENCOUNTER — Encounter: Payer: Self-pay | Admitting: Internal Medicine

## 2023-01-25 ENCOUNTER — Ambulatory Visit (INDEPENDENT_AMBULATORY_CARE_PROVIDER_SITE_OTHER): Payer: PPO | Admitting: Internal Medicine

## 2023-01-25 VITALS — BP 120/70 | HR 71 | Ht 70.0 in | Wt 211.8 lb

## 2023-01-25 DIAGNOSIS — E042 Nontoxic multinodular goiter: Secondary | ICD-10-CM | POA: Diagnosis not present

## 2023-01-25 DIAGNOSIS — Z7984 Long term (current) use of oral hypoglycemic drugs: Secondary | ICD-10-CM | POA: Diagnosis not present

## 2023-01-25 DIAGNOSIS — Z7985 Long-term (current) use of injectable non-insulin antidiabetic drugs: Secondary | ICD-10-CM

## 2023-01-25 DIAGNOSIS — E782 Mixed hyperlipidemia: Secondary | ICD-10-CM

## 2023-01-25 DIAGNOSIS — Z794 Long term (current) use of insulin: Secondary | ICD-10-CM | POA: Diagnosis not present

## 2023-01-25 DIAGNOSIS — R200 Anesthesia of skin: Secondary | ICD-10-CM | POA: Diagnosis not present

## 2023-01-25 DIAGNOSIS — E1165 Type 2 diabetes mellitus with hyperglycemia: Secondary | ICD-10-CM

## 2023-01-25 DIAGNOSIS — E119 Type 2 diabetes mellitus without complications: Secondary | ICD-10-CM

## 2023-01-25 LAB — POCT GLYCOSYLATED HEMOGLOBIN (HGB A1C): Hemoglobin A1C: 6.6 % — AB (ref 4.0–5.6)

## 2023-01-25 MED ORDER — LANTUS SOLOSTAR 100 UNIT/ML ~~LOC~~ SOPN
20.0000 [IU] | PEN_INJECTOR | Freq: Every day | SUBCUTANEOUS | 3 refills | Status: DC
Start: 2023-01-25 — End: 2024-02-29

## 2023-01-25 MED ORDER — GLIPIZIDE 5 MG PO TABS: ORAL_TABLET | ORAL | 3 refills | Status: DC

## 2023-01-25 NOTE — Patient Instructions (Addendum)
Please use:  - Metformin 2000 mg with dinner - Glipizide 5 mg but only before a larger dinner - Jardiance 25 mg before b'fast - Lantus 20-25 units at bedtime - Ozempic 2 mg weekly  Please return in 4-6 months with your sugar log

## 2023-01-25 NOTE — Progress Notes (Signed)
Patient ID: Robert Cordova, male   DOB: 17-Jun-1951, 72 y.o.   MRN: [id number]   HPI: Robert Cordova is a 72 y.o.-year-old male, returning for f/u for DM2, dx in 2013, insulin-dependent since 04/2020, uncontrolled, with complications (h/o DKA). Last visit 5 months ago.  Interim history: He cont. to have increased urination (saw Urology and a medication was prescribed, but he developed joint pains and stopped), no blurry vision, nausea, chest pain. He was just dx'ed with Prostate cancer a PSA was found to be elevated. Just follow-up needed for now. His mother died since last OV after several health problems including a stroke. He relaxed his diet and eating cookies at bedtime.  Reviewed HbA1c levels: Lab Results  Component Value Date   HGBA1C 6.8 (H) 09/04/2022   HGBA1C 6.3 (A) 08/17/2022   HGBA1C 6.3 (A) 04/13/2022  08/22/2016: 7.7%  He is on: - Metformin 1000 mg 2x a day, with meals >> 2000 mg with dinner - Glipizide 10 mg after dinner >> 10 >> 5-10 mg 30 minutes before dinner >> 5 mg before dinner (he forgot to change this before a large dinners only advised at last visit) - Jardiance 25 mg before b'fast - Ozempic 0.5 mg weekly-added 09/2018 >> 1 >> 2 mg weekly - Lantus -started 04/2020 >> .Marland KitchenMarland Kitchen20-24 >> 24 >> 20-25 units at bedtime Of note, He was off and on insulin in the past. He came off when he lost weight after his lab band surgery in 2014 (45 pounds)  He is checking sugars 1-2 times a day: - am: 63, 87-136, 169 >> 72-133, 144, 161 (no insulin) >> 84-132 - 2h after b'fast: 87-125, 147 >> n/c >> 181, 215 >> n/c >> 100 >> 119 - before lunch: 99-122 >> 148 >> n/c >> 148 >> n/c - 2h after lunch: n/c >> 118-215 >> n/c >> 121 >> n/c - before dinner:  119 >> 108-134 >> 94, 138, 150 >> n/c  - 2h after dinner:328 (cheese cake) >> n/c >> 208 >> n/c - bedtime: 64, 79-190, 226 >> 82-192, 202 >> 78-187, 200 >> 67, 68-216 - nighttime: n/c >> 212 >> n/c >> 155 Lowest sugar was 70  >> 90s >> 64 after working in the yard >> 63 >> 72 >> 67 after working out in the yard; he has hypoglycemia awareness in the 70s. Highest sugar was 282 >> 226 x1 >> 203 >> 200 >> 216.  Pt's meals are: - Breakfast: Protein shake + coffee and cream - Lunch (12-1): Eggs, bacon or leftovers - Dinner: Meat + veggies + starch, pizza, [place] food - Snacks: In the evening: cookies, etc. "I snack until I go to bed", craves sweets  -No CKD, last BUN/creatinine:  Lab Results  Component Value Date   BUN 15 09/04/2022   BUN 18 08/12/2021   CREATININE 0.70 09/04/2022   CREATININE 0.72 08/12/2021  On losartan 25 mg twice a day.  -+ HL; last set of lipids: Lab Results  Component Value Date   CHOL 123 09/04/2022   HDL 44.90 09/04/2022   LDLCALC 43 09/04/2022   LDLDIRECT 72.0 05/07/2018   TRIG 177.0 (H) 09/04/2022   CHOLHDL 3 09/04/2022  On Lipitor 20, co-Q10, Krill oil  - last eye exam was in 06/2022: reportedly No DR, + incipient cataract. [facility].  She sees Dr. Lorin Picket and Dr. Burnice Logan.  -+ Numbness in  toes.  Last foot exam 12/2021.  B12 was elevated in 01/2020-we increased his supplement dose from 5000  to 2500 mcg daily.  Afterwards, we reduced the dose to  1000 mcg qod. Stopped B complex.  Reviewed his B12 level: Lab Results  Component Value Date   VITAMINB12 469 09/04/2022   VITAMINB12 824 08/12/2021   VITAMINB12 599 11/23/2020   VITAMINB12 >1526 (H) 01/06/2020   Thyroid nodules: - s/p 2 benign biopsies in 2014  Pt denies: - feeling nodules in neck - hoarseness - dysphagia - choking  Reviewed thyroid imaging results and biopsy results: Thyroid U/S (10/20/2016): Stable 2.8 cm solid nodule in the inferior right lobe.  This was previously sampled with negative biopsy.  The 1.8 cm nodule previously sampled in the right isthmus was no longer identified by ultrasound.  Thyroid U/S (01/15/2020): COMPARISON:  10/19/2016  FINDINGS: Parenchymal Echotexture: Mildly  heterogenous Isthmus: 0.9 cm Right lobe: 6.1 x 2.6 x 1.9 cm. Left lobe: 5.4 x 2.3 x 1.3 cm _________________________________________________________  Nodule # 2: Prior biopsy: Yes Location: Right; Inferior Maximum size: 3.7 cm; Other 2 dimensions: 3.1 x 2.8 cm, previously, 2.8 x 2.6 x 2.1 cm Composition: solid/almost completely solid (2) Echogenicity: isoechoic (1) Significant change in size (>/= 20% in two dimensions and minimal increase of 2 mm): Yes  **Given size (>/= 2.5 cm) and appearance, fine needle aspiration of this mildly suspicious nodule should be considered based on TI-RADS criteria. _______________________________________________________  There is a stable small hypoechoic nodule measuring approximately 7 mm in the right mid thyroid gland.  IMPRESSION: Interval increase in size of the dominant right-sided thyroid nodule, currently measuring 3.7 cm (previously measuring 2.8 cm). While this thyroid nodule was previously biopsied, consider repeat ultrasound or fine-needle aspiration given the nodule's significant interval growth.  Thyroid FNA (01/20/2020): FINAL MICROSCOPIC DIAGNOSIS:  - Consistent with benign follicular nodule (Bethesda category II)   SPECIMEN ADEQUACY:  Satisfactory for evaluation   Thyroid U/S (12/16/2021): Parenchymal Echotexture: Moderately heterogenous  Isthmus: 2.6 cm  Right lobe: 6.1 cm x 2.1 cm x 2.1 cm  Left lobe: 5.0 cm x 2.2 cm x 1.9 cm  _________________________________________________   Nodule labeled 1 superior right thyroid 8 mm, with TR 2/cystic characteristics and does not meet criteria for surveillance.   Nodule labeled 2, lower right thyroid, 4.2 cm. Nodule has been previously biopsied. Assuming benign result, no further specific follow-up would be indicated.   Nodule labeled 3 inferior right thyroid, 1.1 cm with spongiform characteristics. Nodule does not meet criteria for further surveillance.   Nodule labeled 4,  lower left thyroid, 1.4 cm. Nodule has TR 3 characteristics and does not meet criteria for surveillance.   Nodule labeled 5, lower left thyroid, 7 mm with spongiform characteristics and does not meet criteria for surveillance.   Nodule labeled 6, inferior left thyroid, 1.4 cm with TR 3 characteristics. Nodule does not meet criteria for surveillance.   No adenopathy.   Recommendations follow those established by the new ACR TI-RADS criteria (J Am Coll Radiol 2017;14:587-595).   IMPRESSION: Redemonstration of heterogeneous/multinodular thyroid which may indicate medical thyroid disease.   Assuming a benign result of the prior biopsy of nodule labeled 2, no further specific follow-up would be indicated.  Latest TSH was normal: Lab Results  Component Value Date   TSH 0.57 09/04/2022   He also has sleep-related hypoxia, HTN. He has a history of gastric banding (11/25/2013).  Weight before surgery: 259 pounds. He has Medicare with 2 supplemental insurances.  ROS: + see HPI  I reviewed pt's medications, allergies, PMH, social hx, family hx, and changes were  documented in the history of present illness. Otherwise, unchanged from my initial visit note.  Past Medical History:  Diagnosis Date   ABNORMAL GLUCOSE NEC 03/18/2007   Arthritis    fingers   BLEPHARITIS, LEFT 10/29/2009   BPH (benign prostatic hyperplasia)    DIABETES-TYPE 2 04/18/2010   Dry throat    Environmental allergies    GERD (gastroesophageal reflux disease)    H/O hiatal hernia    HYPERLIPIDEMIA 10/22/2007   HYPERTENSION 03/18/2007   Multiple thyroid nodules    "biopsy done, everything was fine" being monitored   NEVUS, MELANOCYTIC, FACE 10/07/2009   Osteoarthritis of both thumbs    Past Surgical History:  Procedure Laterality Date   BIOPSY THYROID  ~08/2013   BREATH TEK H PYLORI N/A 09/05/2013   Procedure: BREATH TEK H PYLORI;  Surgeon: Valarie Merino, MD;  Location: [facility];  Service: General;   Laterality: N/A;   CATARACT EXTRACTION Bilateral    COLONOSCOPY  2009, 2013   Dr. Jarold Motto; multiple polyps   HIATAL HERNIA REPAIR  11/25/2013   Procedure: LAPAROSCOPIC REPAIR OF HIATAL HERNIA;  Surgeon: Valarie Merino, MD;  Location: [facility];  Service: General;;   LAPAROSCOPIC GASTRIC BANDING N/A 11/25/2013   Procedure: LAPAROSCOPIC GASTRIC BANDING;  Surgeon: Valarie Merino, MD;  Location: [facility];  Service: General;  Laterality: N/A;   NO PAST SURGERIES     Social History   Social History   Marital status: Married    Spouse name: N/A   Number of children: 2   Occupational History   Consultant-- [employer]    Social History Main Topics   Smoking status: Never Smoker   Smokeless tobacco: Never Used   Alcohol use No   Drug use: No   Current Outpatient Medications on File Prior to Visit  Medication Sig Dispense Refill   aspirin 81 MG tablet Take 1 tablet (81 mg total) by mouth daily. 30 tablet 5   atorvastatin (LIPITOR) 20 MG tablet TAKE 1 TABLET EVERY DAY 90 tablet 2   Blood Glucose Calibration (TRUE METRIX LEVEL 1) Low SOLN Use as instructed 1 each 0   Blood Glucose Monitoring Suppl (TRUE METRIX AIR GLUCOSE METER) w/Device KIT 1 Device by Other route as directed. 1 kit 0   Cholecalciferol (VITAMIN D PO) Take 50 mcg by mouth daily.     Coenzyme Q10 (CO Q-10) 200 MG CAPS Take by mouth daily.     empagliflozin (JARDIANCE) 25 MG TABS tablet Take 1 tablet (25 mg total) by mouth daily. 90 tablet 3   glipiZIDE (GLUCOTROL) 5 MG tablet TAKE 1 TABLET TWICE DAILY BEFORE MEALS 180 tablet 3   insulin glargine (LANTUS SOLOSTAR) 100 UNIT/ML Solostar Pen Inject 20-24 Units into the skin at bedtime. 15 mL 5   Insulin Pen Needle (DROPLET PEN NEEDLES) 32G X 4 MM MISC USE TO INJECT ONE TIME DAILY 100 each 3   Lidocaine 1.8 % PTCH Apply topically.     losartan (COZAAR) 50 MG tablet TAKE 1 TABLET EVERY DAY 90 tablet 1   Magnesium 250 MG TABS Take by mouth daily.      meloxicam  (MOBIC) 7.5 MG tablet TAKE 1 TABLET BY MOUTH DAILY. TAKE WITH FOOD     metFORMIN (GLUCOPHAGE) 500 MG tablet TAKE 2 TABLETS TWICE DAILY WITH MEALS 360 tablet 3   minoxidil (ROGAINE) 2 % external solution Apply 1 application topically daily. 60 mL 10   Multiple Vitamins-Minerals (MULTIVITAMIN WITH MINERALS) tablet Take 1 tablet  by mouth daily.     OXYGEN-HELIUM IN Inhale into the lungs.      Semaglutide, 2 MG/DOSE, 8 MG/3ML SOPN Inject 2 mg as directed once a week. 9 mL 3   triamcinolone cream (KENALOG) 0.1 % SMARTSIG:1 Sparingly Topical Daily PRN     TRUE METRIX BLOOD GLUCOSE TEST test strip TEST BLOOD SUGAR TWICE DAILY 200 strip 0   TRUEplus Lancets 33G MISC Use as instructed two times daily 300 each 2   vitamin B-12 (CYANOCOBALAMIN) 100 MCG tablet Take 100 mcg by mouth daily.     No current facility-administered medications on file prior to visit.   Allergies  Allergen Reactions   Ace Inhibitors     REACTION: Coughing   Family History  Problem Relation Age of Onset   Hyperlipidemia Mother    Hypertension Mother    Arthritis Mother    Cancer Mother    Hyperlipidemia Father    Hypertension Father    Heart attack Father    Hyperlipidemia Brother    Diabetes Maternal Grandmother    Diabetes Maternal Grandfather    Colon cancer Neg Hx    Stomach cancer Neg Hx    Esophageal cancer Neg Hx    Rectal cancer Neg Hx     PE: There were no vitals taken for this visit. Wt Readings from Last 3 Encounters:  01/24/23 215 lb 3.2 oz (97.6 kg)  12/06/22 217 lb 9.6 oz (98.7 kg)  11/29/22 215 lb (97.5 kg)   Constitutional: overweight, in NAD Eyes: EOMI, no exophthalmos ENT: no thyromegaly but R inf thyroid nodule palpated, measuring approximately 2.5 cm, no cervical lymphadenopathy Cardiovascular: RRR, No MRG Respiratory: CTA B Musculoskeletal: no deformities Skin: no rashes Neurological: + mild tremor with outstretched hands Diabetic Foot Exam - Simple   Simple Foot Form Diabetic  Foot exam was performed with the following findings: Yes 01/25/2023 10:42 AM  Visual Inspection No deformities, no ulcerations, no other skin breakdown bilaterally: Yes Sensation Testing See comments: Yes Pulse Check Posterior Tibialis and Dorsalis pulse intact bilaterally: Yes Comments Decreased sensation to monofilament in lateral L foot. Onychodystrophy 1st 3 toes L foot.    ASSESSMENT: 1. DM2, insulin-dependent, uncontrolled, without long term complications, but with hyperglycemia - h/o DKA  2.  Multinodular goiter  3. HL  4.  Numbness in toes/high B12 level  PLAN:  1. Patient with longstanding, controlled, type 2 diabetes, on oral antidiabetic regimen with metformin, sulfonylurea, SGLT2 inhibitor and also weekly GLP-1 receptor agonist along with long-acting insulin.  HbA1c at last visit was at goal, at 6.3%, improved, but he had another HbA1c 5 months ago and this was higher, at 6.8%.  At last visit, we discussed about using glipizide only with larger meals, not on a daily basis.  Sugars were only higher after dietary indiscretions at night or after he was forgetting to take his insulin.  He had some low blood sugars after dinner, in the 70s, which prompted the recommendation to not take glipizide before regular meals.  We did not change the regimen otherwise. -At today's visit most of the blood sugars are at goal, but at night, he occasionally has blood sugars in the 60s after dinner as well as 200s.  Upon questioning, he did not stop his daily predinner glipizide as advised at last visit to only use it before larger meals, but continues to take it daily, before every dinner.  I again advised him to stop glipizide before regular meals and only to take  it if he plans to have a larger meal.  I also advised him to try to stop cookies at bedtime.  I advised him to let me know if he still has low blood sugars after these changes, in which case, we need to reduce his Lantus dose.  For this, I  also advised him to try to check some blood sugars in the middle of the day. - I suggested to:  Patient Instructions  Please use:  - Metformin 2000 mg with dinner - Glipizide 5 mg but only before a larger dinner - Jardiance 25 mg before b'fast - Lantus 20-25 units at bedtime - Ozempic 2 mg weekly  Please return in 4-6 months with your sugar log  - we checked his HbA1c: 6.6% (lower) - advised to check sugars at different times of the day - 1-2x a day, rotating check times - advised for yearly eye exams >> he is UTD - return to clinic in 4-6 months  2. Thyroid nodules and goiter -Nodules appears to be stable in size on the ultrasound from 2018.  However, thyroid ultrasound from 01/2020 showed an increase in the right inferior dominant nodule size from 2.8 to 3.7 cm.  FNA of this nodule was benign.  The latest neck ultrasound from 12/2021 showed that the nodules appears to be low risk and not worrisome.  No further follow-up is needed for these. -Latest TSH was normal: Lab Results  Component Value Date   TSH 0.57 09/04/2022  -No neck compression symptoms  3. HL -Reviewed latest lipid panel from 08/2022: Fractions at goal with the exception of a slightly high triglyceride level: Lab Results  Component Value Date   CHOL 123 09/04/2022   HDL 44.90 09/04/2022   LDLCALC 43 09/04/2022   LDLDIRECT 72.0 05/07/2018   TRIG 177.0 (H) 09/04/2022   CHOLHDL 3 09/04/2022  -He continues Lipitor 20 mg daily, Krill oil, coq.10, without side effects  4.  Elevated B12 -He usually had numbness and tingling in his hands and a B12 was found to be low -We started supplementation initially with 5000 mcg daily but we decreased the dose to 1000 mcg every other day since then -Latest B12 level was reviewed and this was normal: Lab Results  Component Value Date   VITAMINB12 469 09/04/2022   Carlus Pavlov, MD PhD [facility]

## 2023-02-07 ENCOUNTER — Ambulatory Visit (AMBULATORY_SURGERY_CENTER): Payer: PPO | Admitting: *Deleted

## 2023-02-07 ENCOUNTER — Telehealth: Payer: Self-pay | Admitting: *Deleted

## 2023-02-07 ENCOUNTER — Encounter: Payer: Self-pay | Admitting: Gastroenterology

## 2023-02-07 VITALS — Ht 70.0 in | Wt 215.0 lb

## 2023-02-07 DIAGNOSIS — Z1211 Encounter for screening for malignant neoplasm of colon: Secondary | ICD-10-CM

## 2023-02-07 MED ORDER — NA SULFATE-K SULFATE-MG SULF 17.5-3.13-1.6 GM/177ML PO SOLN: 1.0000 | Freq: Once | ORAL | 0 refills | Status: AC

## 2023-02-07 NOTE — Telephone Encounter (Signed)
Attempt to reach pt for pre-visit. LM with call back #.   Will attempt to reach again in 5 min due to no other # listed in profile LM with facility # for pt to call back if unable to reach pt with second call attempt.  Instructed pt to call # given by end of the day and reschedule the pre-visit or the scheduled procedure will be canceled.   RN will verify if pt has rescheduled pre-visit or not at end of day and if no per protocol Procedure will be canceled,

## 2023-02-07 NOTE — Telephone Encounter (Signed)
Pt called back. °

## 2023-02-07 NOTE — Progress Notes (Signed)
Pt's name and DOB verified at the beginning of the pre-visit.  Pt denies any difficulty with ambulating,sitting, laying down or rolling side to side Gave both [facility] main # and MD on call # prior to instructions.  No egg or soy allergy known to patient  No issues known to pt with past sedation with any surgeries or procedures Pt has no issues moving head neck or swallowing No FH of Malignant Hyperthermia Pt is not on diet pills Pt is not on home 02  Pt is not on blood thinners  Pt denies issues with constipation  Pt has frequent issues with constipation RN instructed pt to use Miralax per bottles instructions a week before prep days. Pt states they will Pt is not on dialysis Pt denise any abnormal heart rhythms  Pt denies any upcoming cardiac testing Pt encouraged to use to use Singlecare or Goodrx to reduce cost  Patient's chart reviewed by Cathlyn Parsons CNRA prior to pre-visit and patient appropriate for the [facility].  Pre-visit completed and red dot placed by patient's name on their procedure day (on provider's schedule).  . Visit by phone Pt states weight is 215 lb Instructed pt why it is important to and  to call if they have any changes in health or new medications. Directed them to the # given and on instructions.   Pt states they will.  Instructions reviewed with pt and pt states understanding. Instructed to review again prior to procedure. Pt states they will.  Instructions sent by mail with coupon and by my chart Pt to be done at [facility]

## 2023-02-12 ENCOUNTER — Encounter (HOSPITAL_COMMUNITY): Payer: Self-pay | Admitting: Gastroenterology

## 2023-02-14 NOTE — Progress Notes (Signed)
Robert Cordova  Prep instructions- reviewed  Debe Coder MD Cardiologist- unknown  EKG- 2019 Echo-2019 Cath-n/a Stress-n/a ICD/PM-n/a Blood thinner-n/a GLP-1- Ozempic 7 day hold  Hx:  HTN, Prostate CA, DM, on oxygen during night for sleep related hypoxemia. Saw pulmonology recently 01/24/23 still using 2L 02  only at night. Had seen cardiology back in 2019 for Dyspnea on exertion but hasnt seen since due to his cardiologist moving. He did mention he made an appt with a new cardiologist and its set for 02/26/23. Currently not having any cardiac symptoms but felt like needed to check and make sure everything is ok. Anesthesia Review: Yes

## 2023-02-22 ENCOUNTER — Other Ambulatory Visit: Payer: Self-pay

## 2023-02-22 ENCOUNTER — Ambulatory Visit (HOSPITAL_BASED_OUTPATIENT_CLINIC_OR_DEPARTMENT_OTHER): Payer: PPO | Admitting: Anesthesiology

## 2023-02-22 ENCOUNTER — Encounter (HOSPITAL_COMMUNITY): Payer: Self-pay | Admitting: Gastroenterology

## 2023-02-22 ENCOUNTER — Ambulatory Visit (HOSPITAL_COMMUNITY): Payer: PPO | Admitting: Anesthesiology

## 2023-02-22 ENCOUNTER — Encounter (HOSPITAL_COMMUNITY)
Admission: RE | Disposition: A | Discharge status: Home / Self Care | Payer: Self-pay | Source: Home / Self Care | Attending: Gastroenterology

## 2023-02-22 ENCOUNTER — Ambulatory Visit (HOSPITAL_COMMUNITY)
Admission: RE | Admit: 2023-02-22 | Discharge: 2023-02-22 | Disposition: A | Discharge status: Home / Self Care | Payer: PPO | Attending: Gastroenterology | Admitting: Gastroenterology

## 2023-02-22 DIAGNOSIS — D126 Benign neoplasm of colon, unspecified: Secondary | ICD-10-CM

## 2023-02-22 DIAGNOSIS — R972 Elevated prostate specific antigen [PSA]: Secondary | ICD-10-CM

## 2023-02-22 DIAGNOSIS — I10 Essential (primary) hypertension: Secondary | ICD-10-CM

## 2023-02-22 DIAGNOSIS — D123 Benign neoplasm of transverse colon: Secondary | ICD-10-CM | POA: Diagnosis not present

## 2023-02-22 DIAGNOSIS — Z09 Encounter for follow-up examination after completed treatment for conditions other than malignant neoplasm: Secondary | ICD-10-CM | POA: Insufficient documentation

## 2023-02-22 DIAGNOSIS — Z1211 Encounter for screening for malignant neoplasm of colon: Secondary | ICD-10-CM | POA: Diagnosis not present

## 2023-02-22 DIAGNOSIS — E785 Hyperlipidemia, unspecified: Secondary | ICD-10-CM

## 2023-02-22 DIAGNOSIS — Z86010 Personal history of colonic polyps: Secondary | ICD-10-CM | POA: Diagnosis not present

## 2023-02-22 DIAGNOSIS — K635 Polyp of colon: Secondary | ICD-10-CM | POA: Diagnosis not present

## 2023-02-22 DIAGNOSIS — D120 Benign neoplasm of cecum: Secondary | ICD-10-CM | POA: Insufficient documentation

## 2023-02-22 DIAGNOSIS — Z9981 Dependence on supplemental oxygen: Secondary | ICD-10-CM

## 2023-02-22 HISTORY — PX: COLONOSCOPY WITH PROPOFOL: SHX5780

## 2023-02-22 HISTORY — PX: POLYPECTOMY: SHX5525

## 2023-02-22 LAB — GLUCOSE, CAPILLARY: Glucose-Capillary: 82 mg/dL (ref 70–99)

## 2023-02-22 SURGERY — COLONOSCOPY WITH PROPOFOL
Anesthesia: Monitor Anesthesia Care

## 2023-02-22 MED ORDER — SODIUM CHLORIDE 0.9 % IV SOLN: INTRAVENOUS | Status: DC | PRN

## 2023-02-22 MED ORDER — SODIUM CHLORIDE 0.9 % IV SOLN: INTRAVENOUS | Status: DC

## 2023-02-22 MED ORDER — LIDOCAINE HCL (CARDIAC) PF 100 MG/5ML IV SOSY
PREFILLED_SYRINGE | INTRAVENOUS | Status: DC | PRN
  Administered 2023-02-22: 80 mg via INTRATRACHEAL

## 2023-02-22 MED ORDER — PROPOFOL 500 MG/50ML IV EMUL
INTRAVENOUS | Status: DC | PRN
  Administered 2023-02-22: 100 ug/kg/min via INTRAVENOUS

## 2023-02-22 MED ORDER — LACTATED RINGERS IV SOLN: INTRAVENOUS | Status: DC

## 2023-02-22 MED ORDER — PROPOFOL 10 MG/ML IV BOLUS
INTRAVENOUS | Status: DC | PRN
  Administered 2023-02-22 (×3): 40 mg via INTRAVENOUS

## 2023-02-22 MED ORDER — LACTATED RINGERS IV SOLN
INTRAVENOUS | Status: AC | PRN
  Administered 2023-02-22: 1000 mL via INTRAVENOUS

## 2023-02-22 SURGICAL SUPPLY — 22 items

## 2023-02-22 NOTE — H&P (Signed)
[facility] History and Physical   Primary Care Physician:  Deeann Saint, MD   Reason for Procedure:   History of colon polyps  Plan:    colonoscopy     HPI: Robert Cordova is a 72 y.o. male  here for colonoscopy surveillance. He had 2 adenomas removed in 2009. Last colonoscopy 2014 was normal without polyps. Patient denies any bowel symptoms at this time. No family history of colon cancer known. Otherwise feels well without any cardiopulmonary symptoms. Case is being done at the hospital given his need for supplemental oxygen use. He otherwise feels at baseline today without complaints.   I have discussed risks / benefits of anesthesia and endoscopic procedure with [name] and they wish to proceed with the exams as outlined today.    Past Medical History:  Diagnosis Date   ABNORMAL GLUCOSE NEC 03/18/2007   Anxiety    Arthritis    fingers   BLEPHARITIS, LEFT 10/29/2009   BPH (benign prostatic hyperplasia)    Cancer (HCC)    Prostate 10/2022   Cataract    both removed   DIABETES-TYPE 2 04/18/2010   Dry throat    Environmental allergies    GERD (gastroesophageal reflux disease)    H/O hiatal hernia    HYPERLIPIDEMIA 10/22/2007   HYPERTENSION 03/18/2007   Multiple thyroid nodules    "biopsy done, everything was fine" being monitored   NEVUS, MELANOCYTIC, FACE 10/07/2009   Osteoarthritis of both thumbs    Oxygen deficiency    Uses O2 concentrator at night  shallow breather    Past Surgical History:  Procedure Laterality Date   BIOPSY THYROID  ~08/2013   BREATH TEK H PYLORI N/A 09/05/2013   Procedure: BREATH TEK H PYLORI;  Surgeon: Valarie Merino, MD;  Location: [facility];  Service: General;  Laterality: N/A;   CATARACT EXTRACTION Bilateral    COLONOSCOPY  2009, 2013   Dr. Jarold Motto; multiple polyps   HIATAL HERNIA REPAIR  11/25/2013   Procedure: LAPAROSCOPIC REPAIR OF HIATAL HERNIA;  Surgeon: Valarie Merino, MD;  Location: [facility];  Service: General;;   LAPAROSCOPIC GASTRIC BANDING N/A 11/25/2013   Procedure: LAPAROSCOPIC GASTRIC BANDING;  Surgeon: Valarie Merino, MD;  Location: [facility];  Service: General;  Laterality: N/A;   NO PAST SURGERIES      Prior to Admission medications   Medication Sig Start Date End Date Taking? Authorizing Provider  atorvastatin (LIPITOR) 20 MG tablet TAKE 1 TABLET EVERY DAY 04/21/22  Yes Deeann Saint, MD  Cholecalciferol (VITAMIN D) 50 MCG (2000 UT) CAPS Take 2,000 Units by mouth daily.   Yes [provider]  Coenzyme Q10 (CO Q-10) 200 MG CAPS Take 200 mg by mouth daily.   Yes [provider]  cyanocobalamin (VITAMIN B12) 1000 MCG tablet Take 1,000 mcg by mouth daily.   Yes [provider]  empagliflozin (JARDIANCE) 25 MG TABS tablet Take 1 tablet (25 mg total) by mouth daily. 08/30/22  Yes Carlus Pavlov, MD  glipiZIDE (GLUCOTROL) 5 MG tablet TAKE 1 TABLET by mouth before dinner 01/25/23  Yes Carlus Pavlov, MD  insulin glargine (LANTUS SOLOSTAR) 100 UNIT/ML Solostar Pen Inject 20-25 Units into the skin at bedtime. 01/25/23  Yes Carlus Pavlov, MD  lidocaine 4 % Place 1 patch onto the skin daily as needed (pain).   Yes [provider]  losartan (COZAAR) 50 MG tablet TAKE 1 TABLET EVERY DAY 07/03/22  Yes Deeann Saint, MD  Magnesium 250 MG  TABS Take 250 mg by mouth daily.   Yes [provider]  meloxicam (MOBIC) 7.5 MG tablet Take 7.5 mg by mouth at bedtime. 11/02/20  Yes [provider]  metFORMIN (GLUCOPHAGE) 500 MG tablet TAKE 2 TABLETS TWICE DAILY WITH MEALS 08/30/22  Yes Carlus Pavlov, MD  minoxidil (ROGAINE) 2 % external solution Apply 1 application topically daily. 08/11/14  Yes Roderick Pee, MD  Misc Natural Products (JOINT HEALTH PO) Take 1 tablet by mouth 2 (two) times daily. Osteo Bi-Flex   Yes [provider]  OXYGEN Inhale 2-2.5 L into the lungs at bedtime.   Yes [provider]   Semaglutide, 2 MG/DOSE, 8 MG/3ML SOPN Inject 2 mg as directed once a week. 08/30/22  Yes Carlus Pavlov, MD  tamsulosin (FLOMAX) 0.4 MG CAPS capsule Take 0.4 mg by mouth at bedtime. 01/07/23  Yes [provider]  Blood Glucose Calibration (TRUE METRIX LEVEL 1) Low SOLN Use as instructed 08/25/20   Carlus Pavlov, MD  Blood Glucose Monitoring Suppl (TRUE METRIX AIR GLUCOSE METER) w/Device KIT 1 Device by Other route as directed. 10/02/17   Carlus Pavlov, MD  Insulin Pen Needle (DROPLET PEN NEEDLES) 32G X 4 MM MISC USE TO INJECT ONE TIME DAILY 08/30/22   Carlus Pavlov, MD  TRUE METRIX BLOOD GLUCOSE TEST test strip TEST BLOOD SUGAR TWICE DAILY 03/30/22   Carlus Pavlov, MD  TRUEplus Lancets 33G MISC Use as instructed two times daily 11/08/20   Carlus Pavlov, MD    Current Facility-Administered Medications  Medication Dose Route Frequency Provider Last Rate Last Admin   0.9 %  sodium chloride infusion   Intravenous Continuous Marshaun Lortie, Willaim Rayas, MD       lactated ringers infusion   Intravenous Continuous Minaal Struckman, Willaim Rayas, MD        Allergies as of 12/26/2022 - Review Complete 12/06/2022  Allergen Reaction Noted   Ace inhibitors      Family History  Problem Relation Age of Onset   Hyperlipidemia Mother    Hypertension Mother    Arthritis Mother    Cancer Mother    Hyperlipidemia Father    Hypertension Father    Heart attack Father    Hyperlipidemia Brother    Diabetes Maternal Grandmother    Diabetes Maternal Grandfather    Colon cancer Neg Hx    Stomach cancer Neg Hx    Esophageal cancer Neg Hx    Rectal cancer Neg Hx    Colon polyps Neg Hx     Social History   Socioeconomic History   Marital status: Married    Spouse name: Robert Cordova   Number of children: 2   Years of education: 16   Highest education level: Bachelor's degree (e.g., BA, AB, BS)  Occupational History   Occupation: Research scientist (medical)-- self-employeed   Occupation: retired   Tobacco Use   Smoking status: Never    Passive exposure: Past   Smokeless tobacco: Never  Vaping Use   Vaping status: Never Used  Substance and Sexual Activity   Alcohol use: No   Drug use: No   Sexual activity: Not Currently    Partners: Female  Other Topics Concern   Not on file  Social History Narrative   Not on file   Social Determinants of Health   Financial Resource Strain: Low Risk  (11/29/2022)   Overall Financial Resource Strain (CARDIA)    Difficulty of Paying Living Expenses: Not hard at all  Food Insecurity: No Food Insecurity (11/29/2022)  Hunger Vital Sign    Worried About Running Out of Food in the Last Year: Never true    Ran Out of Food in the Last Year: Never true  Transportation Needs: No Transportation Needs (11/29/2022)   PRAPARE - Administrator, Civil Service (Medical): No    Lack of Transportation (Non-Medical): No  Physical Activity: Insufficiently Active (11/29/2022)   Exercise Vital Sign    Days of Exercise per Week: 2 days    Minutes of Exercise per Session: 60 min  Stress: No Stress Concern Present (11/29/2022)   [employer] of Occupational Health - Occupational Stress Questionnaire    Feeling of Stress : Not at all  Social Connections: Moderately Isolated (11/29/2022)   Social Connection and Isolation Panel [NHANES]    Frequency of Communication with Friends and Family: More than three times a week    Frequency of Social Gatherings with Friends and Family: More than three times a week    Attends Religious Services: Never    Database administrator or Organizations: No    Attends Banker Meetings: Never    Marital Status: Married  Catering manager Violence: Not At Risk (11/29/2022)   Humiliation, Afraid, Rape, and Kick questionnaire    Fear of Current or Ex-Partner: No    Emotionally Abused: No    Physically Abused: No    Sexually Abused: No    Review of Systems: All other review of systems negative except as  mentioned in the HPI.  Physical Exam: Vital signs There were no vitals taken for this visit.  General:   Alert,  Well-developed, pleasant and cooperative in NAD Lungs:  Clear throughout to auscultation.   Heart:  Regular rate and rhythm Abdomen:  Soft, nontender and nondistended.   Neuro/Psych:  Alert and cooperative. Normal mood and affect. A and O x 3  Harlin Rain, MD [facility]

## 2023-02-22 NOTE — Transfer of Care (Signed)
Immediate Anesthesia Transfer of Care Note  Patient: Robert Cordova  Procedure(s) Performed: COLONOSCOPY WITH PROPOFOL POLYPECTOMY  Patient Location: PACU  Anesthesia Type:MAC  Level of Consciousness: awake and alert   Airway & Oxygen Therapy: Patient Spontanous Breathing and Patient connected to face mask oxygen  Post-op Assessment: Report given to RN and Post -op Vital signs reviewed and stable  Post vital signs: Reviewed and stable  Last Vitals:  Vitals Value Taken Time  BP 122/59 02/22/23 1003  Temp 36.4 C 02/22/23 1003  Pulse 56 02/22/23 1007  Resp 21 02/22/23 1007  SpO2 96 % 02/22/23 1007  Vitals shown include unfiled device data.  Last Pain:  Vitals:   02/22/23 1003  TempSrc: Temporal  PainSc:          Complications: No notable events documented.

## 2023-02-22 NOTE — Op Note (Signed)
[facility] Patient Name: Robert Cordova Procedure Date: 02/22/2023 MRN: [id number] Attending MD: Willaim Rayas. Adela Lank , MD, [id number] Date of Birth: 04/11/1951 CSN: [id number] Age: 72 Admit Type: Outpatient Procedure:                Colonoscopy Indications:              High risk colon cancer surveillance: Personal                            history of colonic polyps - remote history of                            polyps, last exam 2014 normal Providers:                Willaim Rayas. Adela Lank, MD, Marge Duncans, RN, Janie                            Billups, Technician, Izetta Dakin CNA Referring MD:              Medicines:                Monitored Anesthesia Care Complications:            No immediate complications. Estimated blood loss:                            Minimal. Estimated Blood Loss:     Estimated blood loss was minimal. Procedure:                Pre-Anesthesia Assessment:                           - Prior to the procedure, a History and Physical                            was performed, and patient medications and                            allergies were reviewed. The patient's tolerance of                            previous anesthesia was also reviewed. The risks                            and benefits of the procedure and the sedation                            options and risks were discussed with the patient.                            All questions were answered, and informed consent                            was obtained. Prior Anticoagulants: The patient has  taken no anticoagulant or antiplatelet agents. ASA                            Grade Assessment: III - A patient with severe                            systemic disease. After reviewing the risks and                            benefits, the patient was deemed in satisfactory                            condition to undergo the procedure.                            After obtaining informed consent, the colonoscope                            was passed under direct vision. Throughout the                            procedure, the patient's blood pressure, pulse, and                            oxygen saturations were monitored continuously. The                            CF-HQ190L ([id number]) Olympus colonoscope was                            introduced through the anus and advanced to the the                            cecum, identified by appendiceal orifice and                            ileocecal valve. The colonoscopy was performed                            without difficulty. The patient tolerated the                            procedure well. The quality of the bowel                            preparation was good. The ileocecal valve,                            appendiceal orifice, and rectum were photographed. Scope In: 9:32:11 AM Scope Out: 9:55:03 AM Scope Withdrawal Time: 0 hours 14 minutes 41 seconds  Total Procedure Duration: 0 hours 22 minutes 52 seconds  Findings:      The perianal and digital rectal examinations were normal.      A 4 mm polyp was found in the cecum. The  polyp was sessile. The polyp       was removed with a cold snare. Resection and retrieval were complete.      A 3 mm polyp was found in the transverse colon. The polyp was sessile.       The polyp was removed with a cold snare. Resection and retrieval were       complete.      A few small-mouthed diverticula were found in the sigmoid colon.      Internal hemorrhoids were found during retroflexion. The hemorrhoids       were small.      The exam was otherwise without abnormality. Impression:               - One 4 mm polyp in the cecum, removed with a cold                            snare. Resected and retrieved.                           - One 3 mm polyp in the transverse colon, removed                            with a cold snare. Resected and retrieved.                            - Diverticulosis in the sigmoid colon.                           - Internal hemorrhoids.                           - The examination was otherwise normal. Moderate Sedation:      No moderate sedation, case performed with MAC Recommendation:           - Patient has a contact number available for                            emergencies. The signs and symptoms of potential                            delayed complications were discussed with the                            patient. Return to normal activities tomorrow.                            Written discharge instructions were provided to the                            patient.                           - Resume previous diet.                           - Continue present medications.                           -  Await pathology results. Procedure Code(s):        --- Professional ---                           [id number], Colonoscopy, flexible; with removal of                            tumor(s), polyp(s), or other lesion(s) by snare                            technique Diagnosis Code(s):        --- Professional ---                           Z86.010, Personal history of colonic polyps                           D12.0, Benign neoplasm of cecum                           D12.3, Benign neoplasm of transverse colon (hepatic                            flexure or splenic flexure)                           K64.8, Other hemorrhoids                           K57.30, Diverticulosis of large intestine without                            perforation or abscess without bleeding CPT copyright 2022 American Medical Association. All rights reserved. The codes documented in this report are preliminary and upon coder review may  be revised to meet current compliance requirements. Viviann Spare P. Karysa Heft, MD 02/22/2023 9:59:22 AM This report has been signed electronically. Number of Addenda: 0

## 2023-02-22 NOTE — Anesthesia Postprocedure Evaluation (Signed)
Anesthesia Post Note  Patient: [name]  Procedure(s) Performed: COLONOSCOPY WITH PROPOFOL POLYPECTOMY     Patient location during evaluation: PACU Anesthesia Type: MAC Level of consciousness: awake and alert Pain management: pain level controlled Vital Signs Assessment: post-procedure vital signs reviewed and stable Respiratory status: spontaneous breathing, nonlabored ventilation, respiratory function stable and patient connected to nasal cannula oxygen Cardiovascular status: stable and blood pressure returned to baseline Postop Assessment: no apparent nausea or vomiting Anesthetic complications: no   No notable events documented.  Last Vitals:  Vitals:   02/22/23 1010 02/22/23 1020  BP: 128/62 (!) 153/84  Pulse: (!) 55 (!) 53  Resp: 18 17  Temp:    SpO2: 95% 97%    Last Pain:  Vitals:   02/22/23 1020  TempSrc:   PainSc: 0-No pain                 Earl Lites P Filippa Yarbough

## 2023-02-22 NOTE — Anesthesia Preprocedure Evaluation (Signed)
Anesthesia Evaluation  Patient identified by MRN, date of birth, ID band Patient awake    Reviewed: Allergy & Precautions, NPO status , Patient's Chart, lab work & pertinent test results  Airway Mallampati: II  TM Distance: >3 FB Neck ROM: Full    Dental no notable dental hx.    Pulmonary neg pulmonary ROS   Pulmonary exam normal        Cardiovascular hypertension, Pt. on medications  Rhythm:Regular Rate:Normal     Neuro/Psych   Anxiety     negative neurological ROS     GI/Hepatic Neg liver ROS, hiatal hernia,GERD  ,,  Endo/Other  diabetes, Type 2, Oral Hypoglycemic Agents, Insulin Dependent    Renal/GU   negative genitourinary   Musculoskeletal  (+) Arthritis , Osteoarthritis,    Abdominal Normal abdominal exam  (+)   Peds  Hematology negative hematology ROS (+)   Anesthesia Other Findings   Reproductive/Obstetrics                             Anesthesia Physical Anesthesia Plan  ASA: 3  Anesthesia Plan: MAC   Post-op Pain Management:    Induction: Intravenous  PONV Risk Score and Plan: Propofol infusion and Treatment may vary due to age or medical condition  Airway Management Planned: Simple Face Mask and Nasal Cannula  Additional Equipment: None  Intra-op Plan:   Post-operative Plan:   Informed Consent: I have reviewed the patients History and Physical, chart, labs and discussed the procedure including the risks, benefits and alternatives for the proposed anesthesia with the patient or authorized representative who has indicated his/her understanding and acceptance.     Dental advisory given  Plan Discussed with: CRNA  Anesthesia Plan Comments:        Anesthesia Quick Evaluation

## 2023-02-22 NOTE — Discharge Instructions (Signed)
YOU HAD AN ENDOSCOPIC PROCEDURE TODAY: Refer to the procedure report and other information in the discharge instructions given to you for any specific questions about what was found during the examination. If this information does not answer your questions, please call [place] office at [phone] to clarify.   YOU SHOULD EXPECT: Some feelings of bloating in the abdomen. Passage of more gas than usual. Walking can help get rid of the air that was put into your GI tract during the procedure and reduce the bloating. If you had a lower endoscopy (such as a colonoscopy or flexible sigmoidoscopy) you may notice spotting of blood in your stool or on the toilet paper. Some abdominal soreness may be present for a day or two, also.  DIET: Your first meal following the procedure should be a light meal and then it is ok to progress to your normal diet. A half-sandwich or bowl of soup is an example of a good first meal. Heavy or fried foods are harder to digest and may make you feel nauseous or bloated. Drink plenty of fluids but you should avoid alcoholic beverages for 24 hours.  ACTIVITY: Your care partner should take you home directly after the procedure. You should plan to take it easy, moving slowly for the rest of the day. You can resume normal activity the day after the procedure however YOU SHOULD NOT DRIVE, use power tools, machinery or perform tasks that involve climbing or major physical exertion for 24 hours (because of the sedation medicines used during the test).   SYMPTOMS TO REPORT IMMEDIATELY: A gastroenterologist can be reached at any hour. Please call [phone]  for any of the following symptoms:  Following lower endoscopy (colonoscopy, flexible sigmoidoscopy) Excessive amounts of blood in the stool  Significant tenderness, worsening of abdominal pains  Swelling of the abdomen that is new, acute  Fever of 100 or higher   FOLLOW UP:  If any biopsies were taken you will be contacted by  phone or by letter within the next 1-3 weeks. Call [phone]  if you have not heard about the biopsies in 3 weeks.  Please also call with any specific questions about appointments or follow up tests. 

## 2023-02-23 ENCOUNTER — Encounter (HOSPITAL_COMMUNITY): Payer: Self-pay | Admitting: Gastroenterology

## 2023-02-26 ENCOUNTER — Ambulatory Visit: Payer: PPO | Attending: Cardiovascular Disease | Admitting: Cardiovascular Disease

## 2023-02-26 ENCOUNTER — Encounter: Payer: Self-pay | Admitting: Cardiovascular Disease

## 2023-02-26 VITALS — BP 124/70 | HR 64 | Ht 70.0 in | Wt 213.2 lb

## 2023-02-26 DIAGNOSIS — I10 Essential (primary) hypertension: Secondary | ICD-10-CM

## 2023-02-26 DIAGNOSIS — R5383 Other fatigue: Secondary | ICD-10-CM | POA: Diagnosis not present

## 2023-02-26 NOTE — Patient Instructions (Signed)
Medication Instructions:  No changes *If you need a refill on your cardiac medications before your next appointment, please call your pharmacy*   Lab Work: none   Testing/Procedures: Your physician has requested that you have an echocardiogram. Echocardiography is a painless test that uses sound waves to create images of your heart. It provides your doctor with information about the size and shape of your heart and how well your heart's chambers and valves are working. This procedure takes approximately one hour. There are no restrictions for this procedure. Please do NOT wear cologne, perfume, aftershave, or lotions (deodorant is allowed). Please arrive 15 minutes prior to your appointment time.    Follow-Up: At [facility], you and your health needs are our priority.  As part of our continuing mission to provide you with exceptional heart care, we have created designated Provider Care Teams.  These Care Teams include your primary Cardiologist (physician) and Advanced Practice Providers (APPs -  Physician Assistants and Nurse Practitioners) who all work together to provide you with the care you need, when you need it.   Your next appointment:   12 month(s)  Provider:   Verne Carrow, MD  Other Instructions

## 2023-02-26 NOTE — Progress Notes (Signed)
Chief Complaint  Patient presents with   New Patient (Initial Visit)    HTN   History of Present Illness: 72 yo male with history of prostate cancer, diabetes type 2, GERD, hiatal hernia, HTN and hyperlipidemia who is here today as a self referral for the evaluation of HTN. He was seen in 2019 by Dr. Bing Matter. Echo normal in 2019. Normal stress echo in 2019. He tells me today that he feels well overall. He does have fatigue during the day and daytime somnolence. He has had testing for sleep apnea and was told that his oxygen levels dropped so he has supplemental oxygen therapy that he uses when sleeping. No chest pain or dyspnea. No LE edema.   Primary Care Physician: Deeann Saint, MD   Past Medical History:  Diagnosis Date   ABNORMAL GLUCOSE NEC 03/18/2007   Anxiety    Arthritis    fingers   BLEPHARITIS, LEFT 10/29/2009   BPH (benign prostatic hyperplasia)    Cancer (HCC)    Prostate 10/2022   Cataract    both removed   DIABETES-TYPE 2 04/18/2010   Dry throat    Environmental allergies    GERD (gastroesophageal reflux disease)    H/O hiatal hernia    HYPERLIPIDEMIA 10/22/2007   HYPERTENSION 03/18/2007   Multiple thyroid nodules    "biopsy done, everything was fine" being monitored   NEVUS, MELANOCYTIC, FACE 10/07/2009   Osteoarthritis of both thumbs    Oxygen deficiency    Uses O2 concentrator at night  shallow breather    Past Surgical History:  Procedure Laterality Date   BIOPSY THYROID  ~08/2013   BREATH TEK H PYLORI N/A 09/05/2013   Procedure: BREATH TEK H PYLORI;  Surgeon: Valarie Merino, MD;  Location: [facility];  Service: General;  Laterality: N/A;   CATARACT EXTRACTION Bilateral    COLONOSCOPY  2009, 2013   Dr. Jarold Motto; multiple polyps   COLONOSCOPY WITH PROPOFOL N/A 02/22/2023   Procedure: COLONOSCOPY WITH PROPOFOL;  Surgeon: Benancio Deeds, MD;  Location: [facility];  Service: Gastroenterology;  Laterality: N/A;  on Oxygen at night    HIATAL HERNIA REPAIR  11/25/2013   Procedure: LAPAROSCOPIC REPAIR OF HIATAL HERNIA;  Surgeon: Valarie Merino, MD;  Location: [facility];  Service: General;;   LAPAROSCOPIC GASTRIC BANDING N/A 11/25/2013   Procedure: LAPAROSCOPIC GASTRIC BANDING;  Surgeon: Valarie Merino, MD;  Location: [facility];  Service: General;  Laterality: N/A;   POLYPECTOMY  02/22/2023   Procedure: POLYPECTOMY;  Surgeon: Benancio Deeds, MD;  Location: [facility];  Service: Gastroenterology;;    Current Outpatient Medications  Medication Sig Dispense Refill   atorvastatin (LIPITOR) 20 MG tablet TAKE 1 TABLET EVERY DAY 90 tablet 2   Blood Glucose Calibration (TRUE METRIX LEVEL 1) Low SOLN Use as instructed 1 each 0   Blood Glucose Monitoring Suppl (TRUE METRIX AIR GLUCOSE METER) w/Device KIT 1 Device by Other route as directed. 1 kit 0   Cholecalciferol (VITAMIN D) 50 MCG (2000 UT) CAPS Take 2,000 Units by mouth daily.     Coenzyme Q10 (CO Q-10) 200 MG CAPS Take 200 mg by mouth daily.     cyanocobalamin (VITAMIN B12) 1000 MCG tablet Take 1,000 mcg by mouth daily.     empagliflozin (JARDIANCE) 25 MG TABS tablet Take 1 tablet (25 mg total) by mouth daily. 90 tablet 3   glipiZIDE (GLUCOTROL) 5 MG tablet TAKE 1 TABLET by mouth before dinner 90 tablet 3  insulin glargine (LANTUS SOLOSTAR) 100 UNIT/ML Solostar Pen Inject 20-25 Units into the skin at bedtime. 30 mL 3   Insulin Pen Needle (DROPLET PEN NEEDLES) 32G X 4 MM MISC USE TO INJECT ONE TIME DAILY 100 each 3   lidocaine 4 % Place 1 patch onto the skin daily as needed (pain).     losartan (COZAAR) 50 MG tablet TAKE 1 TABLET EVERY DAY 90 tablet 1   Magnesium 250 MG TABS Take 250 mg by mouth daily.     meloxicam (MOBIC) 7.5 MG tablet Take 7.5 mg by mouth at bedtime.     metFORMIN (GLUCOPHAGE) 500 MG tablet TAKE 2 TABLETS TWICE DAILY WITH MEALS 360 tablet 3   minoxidil (ROGAINE) 2 % external solution Apply 1 application topically daily. 60 mL 10   Misc Natural Products  (JOINT HEALTH PO) Take 1 tablet by mouth 2 (two) times daily. Osteo Bi-Flex     OXYGEN Inhale 2-2.5 L into the lungs at bedtime.     Semaglutide, 2 MG/DOSE, 8 MG/3ML SOPN Inject 2 mg as directed once a week. 9 mL 3   tamsulosin (FLOMAX) 0.4 MG CAPS capsule Take 0.4 mg by mouth at bedtime.     TRUE METRIX BLOOD GLUCOSE TEST test strip TEST BLOOD SUGAR TWICE DAILY 200 strip 0   TRUEplus Lancets 33G MISC Use as instructed two times daily 300 each 2   No current facility-administered medications for this visit.    Allergies  Allergen Reactions   Ace Inhibitors Cough    Social History   Socioeconomic History   Marital status: Married    Spouse name: Cleveland Yarbro   Number of children: 2   Years of education: 16   Highest education level: Bachelor's degree (e.g., BA, AB, BS)  Occupational History   Occupation: Research scientist (medical)-- self-employeed/[employer]   Occupation: retired  Tobacco Use   Smoking status: Never    Passive exposure: Past   Smokeless tobacco: Never  Vaping Use   Vaping status: Never Used  Substance and Sexual Activity   Alcohol use: No   Drug use: No   Sexual activity: Not Currently    Partners: Female  Other Topics Concern   Not on file  Social History Narrative   Not on file   Social Determinants of Health   Financial Resource Strain: Low Risk  (11/29/2022)   Overall Financial Resource Strain (CARDIA)    Difficulty of Paying Living Expenses: Not hard at all  Food Insecurity: No Food Insecurity (11/29/2022)   Hunger Vital Sign    Worried About Running Out of Food in the Last Year: Never true    Ran Out of Food in the Last Year: Never true  Transportation Needs: No Transportation Needs (11/29/2022)   PRAPARE - Administrator, Civil Service (Medical): No    Lack of Transportation (Non-Medical): No  Physical Activity: Insufficiently Active (11/29/2022)   Exercise Vital Sign    Days of Exercise per Week: 2 days    Minutes of Exercise per Session:  60 min  Stress: No Stress Concern Present (11/29/2022)   [employer] of Occupational Health - Occupational Stress Questionnaire    Feeling of Stress : Not at all  Social Connections: Moderately Isolated (11/29/2022)   Social Connection and Isolation Panel [NHANES]    Frequency of Communication with Friends and Family: More than three times a week    Frequency of Social Gatherings with Friends and Family: More than three times a week  Attends Religious Services: Never    Active Member of Clubs or Organizations: No    Attends Banker Meetings: Never    Marital Status: Married  Catering manager Violence: Not At Risk (11/29/2022)   Humiliation, Afraid, Rape, and Kick questionnaire    Fear of Current or Ex-Partner: No    Emotionally Abused: No    Physically Abused: No    Sexually Abused: No    Family History  Problem Relation Age of Onset   Hyperlipidemia Mother    Hypertension Mother    Arthritis Mother    Cancer Mother    Heart disease Mother    Hyperlipidemia Father    Hypertension Father    Heart attack Father    Hyperlipidemia Brother    Diabetes Maternal Grandmother    Diabetes Maternal Grandfather    Colon cancer Neg Hx    Stomach cancer Neg Hx    Esophageal cancer Neg Hx    Rectal cancer Neg Hx    Colon polyps Neg Hx     Review of Systems:  As stated in the HPI and otherwise negative.   BP 124/70   Pulse 64   Ht 5\' 10"  (1.778 m)   Wt 96.7 kg   SpO2 96%   BMI 30.59 kg/m   Physical Examination: General: Well developed, well nourished, NAD  HEENT: OP clear, mucus membranes moist  SKIN: warm, dry. No rashes. Neuro: No focal deficits  Musculoskeletal: Muscle strength 5/5 all ext  Psychiatric: Mood and affect normal  Neck: No JVD, no carotid bruits, no thyromegaly, no lymphadenopathy.  Lungs:Clear bilaterally, no wheezes, rhonci, crackles Cardiovascular: Regular rate and rhythm. No murmurs, gallops or rubs. Abdomen:Soft. Bowel sounds present.  Non-tender.  Extremities: No lower extremity edema. Pulses are 2 + in the bilateral DP/PT.  EKG:  EKG is ordered today. The ekg ordered today demonstrates  EKG Interpretation Date/Time:  Monday February 26 2023 11:02:56 EDT Ventricular Rate:  64 PR Interval:  168 QRS Duration:  88 QT Interval:  352 QTC Calculation: 363 R Axis:   -4  Text Interpretation: Normal sinus rhythm Normal ECG When compared with ECG of 09-Jul-2013 10:15, T wave inversion no longer evident in Inferior leads Confirmed by Verne Carrow [phone]) on 02/26/2023 11:16:26 AM    Recent Labs: 09/04/2022: ALT 17; BUN 15; Creatinine, Ser 0.70; Hemoglobin 15.1; Platelets 336.0; Potassium 4.6; Sodium 139; TSH 0.57   Lipid Panel    Component Value Date/Time   CHOL 123 09/04/2022 1051   TRIG 177.0 (H) 09/04/2022 1051   TRIG 150 (H) 07/13/2006 1030   HDL 44.90 09/04/2022 1051   CHOLHDL 3 09/04/2022 1051   VLDL 35.4 09/04/2022 1051   LDLCALC 43 09/04/2022 1051   LDLDIRECT 72.0 05/07/2018 1225     Wt Readings from Last 3 Encounters:  02/26/23 96.7 kg  02/22/23 97.5 kg  02/07/23 97.5 kg    Assessment and Plan:   1. Fatigue: He has had prior testing for sleep apnea. TSH and Hgb normal in January 2024. I will arrange an echo to assess LVEF. I do not think ischemic testing is indicated.   2. HTN: BP is well controlled. No changes today  Labs/ tests ordered today include:   Orders Placed This Encounter  Procedures   EKG 12-Lead   ECHOCARDIOGRAM COMPLETE   Disposition:   F/U with me in one year   Signed, Verne Carrow, MD, [facility] 02/26/2023 11:46 AM    [facility] [address]  [address], [place], [place]  [address] Phone: [phone]; Fax: [phone]

## 2023-02-28 LAB — SURGICAL PATHOLOGY

## 2023-03-01 DIAGNOSIS — G4734 Idiopathic sleep related nonobstructive alveolar hypoventilation: Secondary | ICD-10-CM | POA: Diagnosis not present

## 2023-03-05 ENCOUNTER — Encounter: Payer: Self-pay | Admitting: Gastroenterology

## 2023-03-06 NOTE — Telephone Encounter (Signed)
Order for ONO on Room Air placed on 01/24/23 and confirmed by Elease Hashimoto with [employer]. I have sent urgent message to [employer] for them to check on this issue

## 2023-03-12 DIAGNOSIS — R0902 Hypoxemia: Secondary | ICD-10-CM | POA: Diagnosis not present

## 2023-03-12 DIAGNOSIS — G4730 Sleep apnea, unspecified: Secondary | ICD-10-CM | POA: Diagnosis not present

## 2023-03-19 NOTE — Addendum Note (Signed)
Addended by: Pollie Meyer on: 03/19/2023 09:44 AM   Modules accepted: Orders

## 2023-03-21 ENCOUNTER — Ambulatory Visit (HOSPITAL_COMMUNITY): Payer: PPO | Attending: Cardiovascular Disease

## 2023-03-21 DIAGNOSIS — R5383 Other fatigue: Secondary | ICD-10-CM | POA: Diagnosis not present

## 2023-03-21 DIAGNOSIS — I10 Essential (primary) hypertension: Secondary | ICD-10-CM | POA: Diagnosis not present

## 2023-03-21 LAB — ECHOCARDIOGRAM COMPLETE
AV Vena cont: 0.3 cm
Area-P 1/2: 3.77 cm2
Calc EF: 56.8 %
P 1/2 time: 464 msec
S' Lateral: 2.9 cm
Single Plane A2C EF: 58.4 %
Single Plane A4C EF: 53.3 %

## 2023-03-27 DIAGNOSIS — D225 Melanocytic nevi of trunk: Secondary | ICD-10-CM | POA: Diagnosis not present

## 2023-03-27 DIAGNOSIS — R202 Paresthesia of skin: Secondary | ICD-10-CM | POA: Diagnosis not present

## 2023-03-27 DIAGNOSIS — L814 Other melanin hyperpigmentation: Secondary | ICD-10-CM | POA: Diagnosis not present

## 2023-03-27 DIAGNOSIS — L821 Other seborrheic keratosis: Secondary | ICD-10-CM | POA: Diagnosis not present

## 2023-04-01 DIAGNOSIS — G4734 Idiopathic sleep related nonobstructive alveolar hypoventilation: Secondary | ICD-10-CM | POA: Diagnosis not present

## 2023-04-12 DIAGNOSIS — M180 Bilateral primary osteoarthritis of first carpometacarpal joints: Secondary | ICD-10-CM | POA: Diagnosis not present

## 2023-04-17 NOTE — Telephone Encounter (Signed)
ONO on 03/12/23 showed +desat on room  1hr 26 sec <88%.  Continue on O2 at At bedtime  at 2.5l/m   [facility]

## 2023-04-18 NOTE — Telephone Encounter (Signed)
Patient is returning phone call. Patient phone number is [phone].

## 2023-04-19 NOTE — Telephone Encounter (Signed)
Results have been relayed to the patient/authorized caretaker. The patient/authorized caretaker verbalized understanding. No questions at this time.  Patient would like to know what causes it and can it be cured.

## 2023-05-02 DIAGNOSIS — G4734 Idiopathic sleep related nonobstructive alveolar hypoventilation: Secondary | ICD-10-CM | POA: Diagnosis not present

## 2023-05-08 ENCOUNTER — Encounter: Payer: Self-pay | Admitting: Adult Health

## 2023-05-23 ENCOUNTER — Telehealth: Payer: Self-pay | Admitting: Family Medicine

## 2023-05-23 DIAGNOSIS — C61 Malignant neoplasm of prostate: Secondary | ICD-10-CM | POA: Diagnosis not present

## 2023-05-23 DIAGNOSIS — E785 Hyperlipidemia, unspecified: Secondary | ICD-10-CM

## 2023-05-23 MED ORDER — ATORVASTATIN CALCIUM 20 MG PO TABS
20.0000 mg | ORAL_TABLET | Freq: Every day | ORAL | 2 refills | Status: DC
Start: 2023-05-23 — End: 2023-12-17

## 2023-05-23 NOTE — Telephone Encounter (Signed)
Prescription Request  05/23/2023  LOV: 12/06/2022  What is the name of the medication or equipment? atorvastatin (LIPITOR) 20 MG tablet #90 w.refills  Have you contacted your pharmacy to request a refill? No   Which pharmacy would you like this sent to?  [id number] - [place], [place] - [address] AT [facility] [address] [place] [address] Phone: [phone] Fax: [phone]    Patient notified that their request is being sent to the clinical staff for review and that they should receive a response within 2 business days.   Please advise at Mobile [phone] (mobile)

## 2023-05-30 DIAGNOSIS — R3915 Urgency of urination: Secondary | ICD-10-CM | POA: Diagnosis not present

## 2023-05-30 DIAGNOSIS — C61 Malignant neoplasm of prostate: Secondary | ICD-10-CM | POA: Diagnosis not present

## 2023-05-30 DIAGNOSIS — R350 Frequency of micturition: Secondary | ICD-10-CM | POA: Diagnosis not present

## 2023-06-01 DIAGNOSIS — G4734 Idiopathic sleep related nonobstructive alveolar hypoventilation: Secondary | ICD-10-CM | POA: Diagnosis not present

## 2023-07-02 DIAGNOSIS — G4734 Idiopathic sleep related nonobstructive alveolar hypoventilation: Secondary | ICD-10-CM | POA: Diagnosis not present

## 2023-07-12 ENCOUNTER — Encounter: Payer: Self-pay | Admitting: Internal Medicine

## 2023-07-12 ENCOUNTER — Ambulatory Visit: Payer: PPO | Admitting: Internal Medicine

## 2023-07-12 VITALS — BP 138/84 | HR 63 | Ht 70.0 in | Wt 220.4 lb

## 2023-07-12 DIAGNOSIS — R200 Anesthesia of skin: Secondary | ICD-10-CM | POA: Diagnosis not present

## 2023-07-12 DIAGNOSIS — Z794 Long term (current) use of insulin: Secondary | ICD-10-CM | POA: Diagnosis not present

## 2023-07-12 DIAGNOSIS — E042 Nontoxic multinodular goiter: Secondary | ICD-10-CM | POA: Diagnosis not present

## 2023-07-12 DIAGNOSIS — Z7985 Long-term (current) use of injectable non-insulin antidiabetic drugs: Secondary | ICD-10-CM

## 2023-07-12 DIAGNOSIS — E782 Mixed hyperlipidemia: Secondary | ICD-10-CM

## 2023-07-12 DIAGNOSIS — Z7984 Long term (current) use of oral hypoglycemic drugs: Secondary | ICD-10-CM | POA: Diagnosis not present

## 2023-07-12 DIAGNOSIS — E1165 Type 2 diabetes mellitus with hyperglycemia: Secondary | ICD-10-CM | POA: Diagnosis not present

## 2023-07-12 LAB — POCT GLYCOSYLATED HEMOGLOBIN (HGB A1C): Hemoglobin A1C: 6.6 % — AB (ref 4.0–5.6)

## 2023-07-12 MED ORDER — TRUE METRIX AIR GLUCOSE METER W/DEVICE KIT: 1.0000 | PACK | 0 refills | Status: AC

## 2023-07-12 NOTE — Patient Instructions (Addendum)
Please use:  - Metformin 2000 mg with dinner - Glipizide 5 mg before dinner - Jardiance 25 mg before b'fast - Lantus 20 units at bedtime - Ozempic 2 mg weekly  If you have a particularly small dinner >> do not take the Glipizide.  Please return in 4-6 months with your sugar log

## 2023-07-12 NOTE — Progress Notes (Signed)
Patient ID: Robert Cordova, male   DOB: [birth year], 72 y.o.   MRN: [id number]   HPI: Robert Cordova is a 72 y.o.-year-old male, returning for f/u for DM2, dx in 2013, insulin-dependent since 04/2020, uncontrolled, with complications (h/o DKA). Last visit 6 months ago.  Interim history: He continues to have increased urination (he previously saw urology and a medication was prescribed, but he developed joint pains and stopped), no blurry vision, nausea, chest pain.  Reviewed HbA1c levels: Lab Results  Component Value Date   HGBA1C 6.6 (A) 01/25/2023   HGBA1C 6.8 (H) 09/04/2022   HGBA1C 6.3 (A) 08/17/2022  08/22/2016: 7.7%  He is on: - Metformin 1000 mg 2x a day, with meals >> 2000 mg with dinner - Glipizide 10 mg after dinner >> 10 >> 5-10 mg 30 minutes before dinner >> 5 mg before dinner (he forgot to change this before a large dinners only advised at last 2 visit)  - Jardiance 25 mg before b'fast - Ozempic 0.5 mg weekly-added 09/2018 >> 1 >> 2 mg weekly - Lantus -started 04/2020 >> .Marland KitchenMarland Kitchen20-24 >> 24 >> 20-25 >> 20 units at bedtime Of note, He was off and on insulin in the past. He came off when he lost weight after his lab band surgery in 2014 (45 pounds)  He is checking sugars 1-2 times a day: - am: 63, 87-136, 169 >> 72-133, 144, 161 >> 84-132 >> 78-141, 154 - 2h after b'fast: n/c >> 181, 215 >> n/c >> 100 >> 119 >> n/c - before lunch: 99-122 >> 148 >> n/c >> 148 >> n/c - 2h after lunch: n/c >> 118-215 >> n/c >> 121 >> n/c - before dinner:  119 >> 108-134 >> 94, 138, 150 >> n/c >> 130 - 2h after dinner:328 (cheese cake) >> n/c >> 208 >> n/c >> 142 - bedtime: 82-192, 202 >> 78-187, 200 >> 67, 68-216 >> 60, 94-207 - nighttime: n/c >> 212 >> n/c >> 155 >> 102 Lowest sugar was 64 after working in the yard >> 63 >> 72 >> 67 >> 60; he has hypoglycemia awareness in the 70s. Highest sugar was 200 >> 216 >> 207.  Pt's meals are: - Breakfast: Protein shake + coffee and cream -  Lunch (12-1): Eggs, bacon or leftovers - Dinner: Meat + veggies + starch, pizza, [place] food - Snacks: In the evening: cookies, etc. "I snack until I go to bed", craves sweets  -No CKD, last BUN/creatinine:  Lab Results  Component Value Date   BUN 15 09/04/2022   BUN 18 08/12/2021   CREATININE 0.70 09/04/2022   CREATININE 0.72 08/12/2021   Lab Results  Component Value Date   MICRALBCREAT 0.8 09/04/2022   MICRALBCREAT 1.2 05/07/2018   MICRALBCREAT 0.9 05/15/2017   MICRALBCREAT 0.6 11/30/2016   MICRALBCREAT 0.5 08/10/2015   MICRALBCREAT 0.3 08/04/2014   MICRALBCREAT 0.2 04/30/2014   MICRALBCREAT 0.5 04/04/2013   MICRALBCREAT 0.3 01/22/2012   MICRALBCREAT 1.4 12/28/2010  On losartan 25 mg twice a day.  -+ HL; last set of lipids: Lab Results  Component Value Date   CHOL 123 09/04/2022   HDL 44.90 09/04/2022   LDLCALC 43 09/04/2022   LDLDIRECT 72.0 05/07/2018   TRIG 177.0 (H) 09/04/2022   CHOLHDL 3 09/04/2022  On Lipitor 20, co-Q10, Krill oil  - last eye exam was in 06/2022: reportedly No DR, + incipient cataract. [facility].  She sees Dr. Lorin Picket and Dr. Burnice Logan.  -+ Numbness in  toes.  Last foot  exam 01/25/2023.  B12 was elevated in 01/2020-we increased his supplement dose from 5000 to 2500 mcg daily.  Afterwards, we reduced the dose to  1000 mcg qod. Stopped B complex.  Reviewed his B12 level: Lab Results  Component Value Date   VITAMINB12 469 09/04/2022   VITAMINB12 824 08/12/2021   VITAMINB12 599 11/23/2020   VITAMINB12 >1526 (H) 01/06/2020   Thyroid nodules: - s/p 2 benign biopsies in 2014  Pt denies: - feeling nodules in neck - hoarseness - dysphagia - choking  Reviewed thyroid imaging results and biopsy results: Thyroid U/S (10/20/2016): Stable 2.8 cm solid nodule in the inferior right lobe.  This was previously sampled with negative biopsy.  The 1.8 cm nodule previously sampled in the right isthmus was no longer identified by  ultrasound.  Thyroid U/S (01/15/2020): COMPARISON:  10/19/2016  FINDINGS: Parenchymal Echotexture: Mildly heterogenous Isthmus: 0.9 cm Right lobe: 6.1 x 2.6 x 1.9 cm. Left lobe: 5.4 x 2.3 x 1.3 cm _________________________________________________________  Nodule # 2: Prior biopsy: Yes Location: Right; Inferior Maximum size: 3.7 cm; Other 2 dimensions: 3.1 x 2.8 cm, previously, 2.8 x 2.6 x 2.1 cm Composition: solid/almost completely solid (2) Echogenicity: isoechoic (1) Significant change in size (>/= 20% in two dimensions and minimal increase of 2 mm): Yes  **Given size (>/= 2.5 cm) and appearance, fine needle aspiration of this mildly suspicious nodule should be considered based on TI-RADS criteria. _______________________________________________________  There is a stable small hypoechoic nodule measuring approximately 7 mm in the right mid thyroid gland.  IMPRESSION: Interval increase in size of the dominant right-sided thyroid nodule, currently measuring 3.7 cm (previously measuring 2.8 cm). While this thyroid nodule was previously biopsied, consider repeat ultrasound or fine-needle aspiration given the nodule's significant interval growth.  Thyroid FNA (01/20/2020): FINAL MICROSCOPIC DIAGNOSIS:  - Consistent with benign follicular nodule (Bethesda category II)   SPECIMEN ADEQUACY:  Satisfactory for evaluation   Thyroid U/S (12/16/2021): Parenchymal Echotexture: Moderately heterogenous  Isthmus: 2.6 cm  Right lobe: 6.1 cm x 2.1 cm x 2.1 cm  Left lobe: 5.0 cm x 2.2 cm x 1.9 cm  _________________________________________________   Nodule labeled 1 superior right thyroid 8 mm, with TR 2/cystic characteristics and does not meet criteria for surveillance.   Nodule labeled 2, lower right thyroid, 4.2 cm. Nodule has been previously biopsied. Assuming benign result, no further specific follow-up would be indicated.   Nodule labeled 3 inferior right thyroid, 1.1 cm  with spongiform characteristics. Nodule does not meet criteria for further surveillance.   Nodule labeled 4, lower left thyroid, 1.4 cm. Nodule has TR 3 characteristics and does not meet criteria for surveillance.   Nodule labeled 5, lower left thyroid, 7 mm with spongiform characteristics and does not meet criteria for surveillance.   Nodule labeled 6, inferior left thyroid, 1.4 cm with TR 3 characteristics. Nodule does not meet criteria for surveillance.   No adenopathy.   Recommendations follow those established by the new ACR TI-RADS criteria (J Am Coll Radiol 2017;14:587-595).   IMPRESSION: Redemonstration of heterogeneous/multinodular thyroid which may indicate medical thyroid disease.   Assuming a benign result of the prior biopsy of nodule labeled 2, no further specific follow-up would be indicated.  Latest TSH was normal: Lab Results  Component Value Date   TSH 0.57 09/04/2022   He also has sleep-related hypoxia, HTN.  Also, prostate cancer-followed expectantly. He has a history of gastric banding (11/25/2013).  Weight before surgery: 259 pounds. He has Medicare with 2 supplemental insurances.  ROS: + see HPI  I reviewed pt's medications, allergies, PMH, social hx, family hx, and changes were documented in the history of present illness. Otherwise, unchanged from my initial visit note.  Past Medical History:  Diagnosis Date   ABNORMAL GLUCOSE NEC 03/18/2007   Anxiety    Arthritis    fingers   BLEPHARITIS, LEFT 10/29/2009   BPH (benign prostatic hyperplasia)    Cancer (HCC)    Prostate 10/2022   Cataract    both removed   DIABETES-TYPE 2 04/18/2010   Dry throat    Environmental allergies    GERD (gastroesophageal reflux disease)    H/O hiatal hernia    HYPERLIPIDEMIA 10/22/2007   HYPERTENSION 03/18/2007   Multiple thyroid nodules    "biopsy done, everything was fine" being monitored   NEVUS, MELANOCYTIC, FACE 10/07/2009   Osteoarthritis of both  thumbs    Oxygen deficiency    Uses O2 concentrator at night  shallow breather   Past Surgical History:  Procedure Laterality Date   BIOPSY THYROID  ~08/2013   BREATH TEK H PYLORI N/A 09/05/2013   Procedure: BREATH TEK H PYLORI;  Surgeon: Valarie Merino, MD;  Location: [facility];  Service: General;  Laterality: N/A;   CATARACT EXTRACTION Bilateral    COLONOSCOPY  2009, 2013   Dr. Jarold Motto; multiple polyps   COLONOSCOPY WITH PROPOFOL N/A 02/22/2023   Procedure: COLONOSCOPY WITH PROPOFOL;  Surgeon: Benancio Deeds, MD;  Location: [facility];  Service: Gastroenterology;  Laterality: N/A;  on Oxygen at night   HIATAL HERNIA REPAIR  11/25/2013   Procedure: LAPAROSCOPIC REPAIR OF HIATAL HERNIA;  Surgeon: Valarie Merino, MD;  Location: [facility];  Service: General;;   LAPAROSCOPIC GASTRIC BANDING N/A 11/25/2013   Procedure: LAPAROSCOPIC GASTRIC BANDING;  Surgeon: Valarie Merino, MD;  Location: [facility];  Service: General;  Laterality: N/A;   POLYPECTOMY  02/22/2023   Procedure: POLYPECTOMY;  Surgeon: Benancio Deeds, MD;  Location: [facility];  Service: Gastroenterology;;   Social History   Social History   Marital status: Married    Spouse name: N/A   Number of children: 2   Occupational History   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 90 tablet 2   Blood Glucose Calibration (TRUE METRIX LEVEL 1) Low SOLN Use as instructed 1 each 0   Blood Glucose Monitoring Suppl (TRUE METRIX AIR GLUCOSE METER) w/Device KIT 1 Device by Other route as directed. 1 kit 0   Cholecalciferol (VITAMIN D) 50 MCG (2000 UT) CAPS Take 2,000 Units by mouth daily.     Coenzyme Q10 (CO Q-10) 200 MG CAPS  Take 200 mg by mouth daily.     cyanocobalamin (VITAMIN B12) 1000 MCG tablet Take 1,000 mcg by mouth daily.     empagliflozin (JARDIANCE) 25 MG TABS tablet Take 1 tablet (25 mg total) by mouth daily. 90 tablet 3   glipiZIDE (GLUCOTROL) 5 MG tablet TAKE 1 TABLET by mouth before dinner 90 tablet 3   insulin glargine (LANTUS SOLOSTAR) 100 UNIT/ML Solostar Pen Inject 20-25 Units into the skin at bedtime. 30 mL 3   Insulin Pen Needle (DROPLET PEN NEEDLES) 32G X 4  MM MISC USE TO INJECT ONE TIME DAILY 100 each 3   lidocaine 4 % Place 1 patch onto the skin daily as needed (pain).     losartan (COZAAR) 50 MG tablet TAKE 1 TABLET EVERY DAY 90 tablet 1   Magnesium 250 MG TABS Take 250 mg by mouth daily.     meloxicam (MOBIC) 7.5 MG tablet Take 7.5 mg by mouth at bedtime.     metFORMIN (GLUCOPHAGE) 500 MG tablet TAKE 2 TABLETS TWICE DAILY WITH MEALS 360 tablet 3   minoxidil (ROGAINE) 2 % external solution Apply 1 application topically daily. 60 mL 10   Misc Natural Products (JOINT HEALTH PO) Take 1 tablet by mouth 2 (two) times daily. Osteo Bi-Flex     OXYGEN Inhale 2-2.5 L into the lungs at bedtime.     Semaglutide, 2 MG/DOSE, 8 MG/3ML SOPN Inject 2 mg as directed once a week. 9 mL 3   tamsulosin (FLOMAX) 0.4 MG CAPS capsule Take 0.4 mg by mouth at bedtime.     TRUE METRIX BLOOD GLUCOSE TEST test strip TEST BLOOD SUGAR TWICE DAILY 200 strip 0   TRUEplus Lancets 33G MISC Use as instructed two times daily 300 each 2   No current facility-administered medications on file prior to visit.   Allergies  Allergen Reactions   Ace Inhibitors Cough   Family History  Problem Relation Age of Onset   Hyperlipidemia Mother    Hypertension Mother    Arthritis Mother    Cancer Mother    Heart disease Mother    Hyperlipidemia Father    Hypertension Father    Heart attack Father    Hyperlipidemia Brother    Diabetes Maternal Grandmother    Diabetes Maternal Grandfather    Colon cancer Neg Hx    Stomach  cancer Neg Hx    Esophageal cancer Neg Hx    Rectal cancer Neg Hx    Colon polyps Neg Hx    PE: There were no vitals taken for this visit. Wt Readings from Last 3 Encounters:  02/26/23 213 lb 3.2 oz (96.7 kg)  02/22/23 214 lb 15.2 oz (97.5 kg)  02/07/23 215 lb (97.5 kg)   Constitutional: overweight, in NAD Eyes: EOMI, no exophthalmos ENT: no thyromegaly but R inf thyroid nodule palpated, measuring approximately 2.5 cm, no cervical lymphadenopathy Cardiovascular: RRR, No MRG Respiratory: CTA B Musculoskeletal: no deformities Skin: no rashes Neurological: + mild tremor with outstretched hands  ASSESSMENT: 1. DM2, insulin-dependent, uncontrolled, without long term complications, but with hyperglycemia - h/o DKA  2.  Multinodular goiter  3. HL  4.  Numbness in toes/high B12 level  PLAN:  1. Patient with longstanding, uncontrolled, on oral antidiabetic regimen with metformin, sulfonylurea, and SGLT2 inhibitor along with daily long-acting insulin and weekly GLP-1 receptor agonist.  Latest HbA1c was lower, at 6.6%.  At that time, we did not change his regimen but I did advise him to only take glipizide before larger dinner as he had some low blood sugars overnight, in the 60s.  I advised him that if he continues to have low blood sugars to let me know, to decrease the Lantus dose.  I also advised him to try to check some blood sugars in the middle of the day. -At today's visit, we reviewed his log together.  Sugars appear to be mostly at goal, but occasionally above target especially at night.  He tells me that he is taking glipizide before every dinner, did not switch to taking  it only with larger meals.  However, he does not have lows, only 1 low blood sugar at 60 last night, but no other instances in the last 2 months.  For now, especially during the holidays, I advised him to continue taking the glipizide at night but to try to avoid it with small meals.  We can continue the rest of the  regimen for now. - I suggested to:  Patient Instructions  Please use:  - Metformin 2000 mg with dinner - Glipizide 5 mg before dinner - Jardiance 25 mg before b'fast - Lantus 20 units at bedtime - Ozempic 2 mg weekly  If you have a particularly small dinner >> do not take the Glipizide.  Please return in 4-6 months with your sugar log  - we checked his HbA1c: 6.6% (stable) - advised to check sugars at different times of the day - 1x a day, rotating check times - advised for yearly eye exams >> he is UTD - return to clinic in 4-6 months  2. Thyroid nodules and goiter -Nodules appeared to be stable in size on the ultrasound from 2018, but in 01/2020, a new ultrasound showed an increase in the right inferior dominant nodule size from 2.8 to 3.7 cm.  A biopsy of this nodule was benign.  The latest neck ultrasound from 12/2021 showed that the nodules appear to be low risk and not worrisome.  No further imaging follow-up is needed for this, but we will continue to follow him clinically. -TSH was normal: Lab Results  Component Value Date   TSH 0.57 09/04/2022  -No neck compression symptoms  3. HL -Reviewed latest lipid panel from 08/2022: Fractions at goal with exception of a slightly high triglyceride level: Lab Results  Component Value Date   CHOL 123 09/04/2022   HDL 44.90 09/04/2022   LDLCALC 43 09/04/2022   LDLDIRECT 72.0 05/07/2018   TRIG 177.0 (H) 09/04/2022   CHOLHDL 3 09/04/2022  -He is on Lipitor 20 mg daily, Krill oil, co-Q10, without side effects -Will recheck this today  4.  Elevated B12 -he had numbness and tingling in his hands and a B12 was found to be low -We started supplementation initially with 5000 mcg daily but we decreased the dose to 1000 mcg every other day since then -His B12 level was reviewed and this was normal: Lab Results  Component Value Date   VITAMINB12 469 09/04/2022  -We will recheck this today  Component     Latest Ref Rng 07/12/2023   Hemoglobin A1C     4.0 - 5.6 % 6.6 !   Sodium     135 - 146 mmol/L 141   Potassium     3.5 - 5.3 mmol/L 4.6   Chloride     98 - 110 mmol/L 105   CO2     20 - 32 mmol/L 27   Glucose     65 - 99 mg/dL 657 (H)   BUN     7 - 25 mg/dL 15   Creatinine     8.46 - 1.28 mg/dL 9.62   Calcium     8.6 - 10.3 mg/dL 9.7   Cholesterol     <952 mg/dL 841   Triglycerides     <150 mg/dL 324 (H)   HDL Cholesterol     > OR = 40 mg/dL 41   Total CHOL/HDL Ratio     <5.0 (calc) 2.7   TSH     0.40 - 4.50 mIU/L 0.56  Vitamin B12     200 - 1,100 pg/mL 1,134 (H)   Total Bilirubin     0.2 - 1.2 mg/dL 0.4   AST     10 - 35 U/L 15   ALT     9 - 46 U/L 17   Total Protein     6.1 - 8.1 g/dL 6.9   Microalb, Ur     mg/dL <1.6   MICROALB/CREAT RATIO     <30 mg/g creat NOTE   Creatinine, Urine     20 - 320 mg/dL 46   LDL Cholesterol (Calc)     mg/dL (calc) 44   Non-HDL Cholesterol (Calc)     <130 mg/dL (calc) 68   BUN/Creatinine Ratio     6 - 22 (calc) SEE NOTE:   Albumin MSPROF     3.6 - 5.1 g/dL 4.4   Globulin     1.9 - 3.7 g/dL (calc) 2.5   AG Ratio     1.0 - 2.5 (calc) 1.8   Alkaline phosphatase (APISO)     35 - 144 U/L 55   Labs are at goal.  Carlus Pavlov, MD PhD [facility] Endocrinology

## 2023-07-13 LAB — COMPREHENSIVE METABOLIC PANEL
AG Ratio: 1.8 (calc) (ref 1.0–2.5)
ALT: 17 U/L (ref 9–46)
AST: 15 U/L (ref 10–35)
Albumin: 4.4 g/dL (ref 3.6–5.1)
Alkaline phosphatase (APISO): 55 U/L (ref 35–144)
BUN: 15 mg/dL (ref 7–25)
CO2: 27 mmol/L (ref 20–32)
Calcium: 9.7 mg/dL (ref 8.6–10.3)
Chloride: 105 mmol/L (ref 98–110)
Creat: 0.73 mg/dL (ref 0.70–1.28)
Globulin: 2.5 g/dL (ref 1.9–3.7)
Glucose, Bld: 126 mg/dL — ABNORMAL HIGH (ref 65–99)
Potassium: 4.6 mmol/L (ref 3.5–5.3)
Sodium: 141 mmol/L (ref 135–146)
Total Bilirubin: 0.4 mg/dL (ref 0.2–1.2)
Total Protein: 6.9 g/dL (ref 6.1–8.1)

## 2023-07-13 LAB — LIPID PANEL
Cholesterol: 109 mg/dL (ref <200)
HDL: 41 mg/dL (ref >=40)
LDL Cholesterol (Calc): 44 mg/dL
Non-HDL Cholesterol (Calc): 68 mg/dL (ref <130)
Total CHOL/HDL Ratio: 2.7 (calc) (ref <5.0)
Triglycerides: 160 mg/dL — ABNORMAL HIGH (ref <150)

## 2023-07-13 LAB — MICROALBUMIN / CREATININE URINE RATIO
Creatinine, Urine: 46 mg/dL (ref 20–320)
Microalb, Ur: <0.2 mg/dL

## 2023-07-13 LAB — TSH: TSH: 0.56 m[IU]/L (ref 0.40–4.50)

## 2023-07-13 LAB — VITAMIN B12: Vitamin B-12: 1134 pg/mL — ABNORMAL HIGH (ref 200–1100)

## 2023-08-01 DIAGNOSIS — G4734 Idiopathic sleep related nonobstructive alveolar hypoventilation: Secondary | ICD-10-CM | POA: Diagnosis not present

## 2023-08-13 ENCOUNTER — Ambulatory Visit: Payer: Self-pay | Admitting: Family Medicine

## 2023-08-13 NOTE — Telephone Encounter (Signed)
 Copied from CRM [id number]. Topic: Clinical - Red Word Triage >> Aug 13, 2023  4:22 PM Joanell B wrote: Red Word that prompted transfer to Nurse Triage: Pt is having dizziness and having a hard time keeping his balance   Chief Complaint: Loss of balance, unsteadiness Symptoms: Loss of balance, unsteadiness Frequency: Ongoing for past 4-5 days Disposition: [] ED /[] Urgent Care (no appt availability in office) / [x] Appointment(In office/virtual)/ []  [facility]/ [] Home Care/ [] Refused Recommended Disposition /[]  Mobile Bus/ []  Follow-up with PCP  Additional Notes: Patient stated he has been having intermittent episodes of unsteadiness, loss of balance for 4-5 days. He stated that the sensation is mild. He is still able to walk without assistance and do his normal activities. He feels okay today. Appointment scheduled for 1/7.   Reason for Disposition  [1] MILD dizziness (e.g., walking normally) AND [2] has NOT been evaluated by doctor (or NP/PA) for this  (Exception: Dizziness caused by heat exposure, sudden standing, or poor fluid intake.)  Answer Assessment - Initial Assessment Questions 1. DESCRIPTION: Describe your dizziness.     Occasional trouble balancing, hard to walk straight, unsteady, sensation comes and goes  2. VERTIGO: Do you feel like either you or the room is spinning or tilting? (i.e. vertigo)     No   3. SEVERITY: How bad is it?  Do you feel like you are going to faint? Can you stand and walk?   - MILD: Feels slightly dizzy, but walking normally.   - MODERATE: Feels unsteady when walking, but not falling; interferes with normal activities (e.g., school, work).   - SEVERE: Unable to walk without falling, or requires assistance to walk without falling; feels like passing out now.      Mild  4. ONSET:  When did the dizziness begin?     5 days ago, was working outside when it first started  5. CAUSE: What do you think is causing the  dizziness?     Unknown, but sinuses have been acting up lately  6. RECURRENT SYMPTOM: Have you had dizziness before? If Yes, ask: When was the last time? What happened that time?     First time  7. OTHER SYMPTOMS: Do you have any other symptoms? (e.g., fever, chest pain, vomiting, diarrhea, bleeding) No  Protocols used: Dizziness - Lightheadedness-A-AH

## 2023-08-14 ENCOUNTER — Ambulatory Visit (INDEPENDENT_AMBULATORY_CARE_PROVIDER_SITE_OTHER): Payer: PPO | Admitting: Family Medicine

## 2023-08-14 ENCOUNTER — Encounter: Payer: Self-pay | Admitting: Family Medicine

## 2023-08-14 VITALS — BP 130/66 | HR 60 | Temp 97.8°F | Wt 218.9 lb

## 2023-08-14 DIAGNOSIS — R42 Dizziness and giddiness: Secondary | ICD-10-CM

## 2023-08-14 DIAGNOSIS — R251 Tremor, unspecified: Secondary | ICD-10-CM

## 2023-08-14 NOTE — Progress Notes (Signed)
 Established Patient Office Visit  Subjective   Patient ID: Robert Cordova, male    DOB: 19-Jan-1951  Age: 73 y.o. MRN: [id number]  Chief Complaint  Patient presents with   Dizziness    HPI   Patient seen today as a work in with chief complaint of dizziness.  Onset a few days ago.  He woke up with some dizziness which he described as feeling off balance .  Worse in the morning and improved as the day went on.  Symptoms essentially resolved today.  They were much better yesterday.  No syncope.  No lightheadedness.  Denies any focal weakness.  No recent speech changes, difficulty swallowing, visual changes, or any other focal neurologic symptoms.  No cognitive changes.  Has had some recent nasal congestion which he thinks is allergy related.  He has chronic bilateral upper extremity tremor and apparently essential tremor.  Not describing any ataxia.  He has type 2 diabetes followed by endocrinology.  No recent medication changes.  Recent A1c 6.6%.  No recent hypoglycemic symptoms.  Past Medical History:  Diagnosis Date   ABNORMAL GLUCOSE NEC 03/18/2007   Anxiety    Arthritis    fingers   BLEPHARITIS, LEFT 10/29/2009   BPH (benign prostatic hyperplasia)    Cancer (HCC)    Prostate 10/2022   Cataract    both removed   DIABETES-TYPE 2 04/18/2010   Dry throat    Environmental allergies    GERD (gastroesophageal reflux disease)    H/O hiatal hernia    HYPERLIPIDEMIA 10/22/2007   HYPERTENSION 03/18/2007   Multiple thyroid  nodules    biopsy done, everything was fine being monitored   NEVUS, MELANOCYTIC, FACE 10/07/2009   Osteoarthritis of both thumbs    Oxygen  deficiency    Uses O2 concentrator at night  shallow breather   Past Surgical History:  Procedure Laterality Date   BIOPSY THYROID   ~08/2013   BREATH TEK H PYLORI N/A 09/05/2013   Procedure: BREATH TEK H PYLORI;  Surgeon: Donnice KATHEE Lunger, MD;  Location: [facility];  Service: General;  Laterality: N/A;   CATARACT  EXTRACTION Bilateral    COLONOSCOPY  2009, 2013   Dr. Jakie; multiple polyps   COLONOSCOPY WITH PROPOFOL  N/A 02/22/2023   Procedure: COLONOSCOPY WITH PROPOFOL ;  Surgeon: Leigh Elspeth SQUIBB, MD;  Location: [facility];  Service: Gastroenterology;  Laterality: N/A;  on Oxygen  at night   HIATAL HERNIA REPAIR  11/25/2013   Procedure: LAPAROSCOPIC REPAIR OF HIATAL HERNIA;  Surgeon: Donnice KATHEE Lunger, MD;  Location: [facility];  Service: General;;   LAPAROSCOPIC GASTRIC BANDING N/A 11/25/2013   Procedure: LAPAROSCOPIC GASTRIC BANDING;  Surgeon: Donnice KATHEE Lunger, MD;  Location: [facility];  Service: General;  Laterality: N/A;   POLYPECTOMY  02/22/2023   Procedure: POLYPECTOMY;  Surgeon: Leigh Elspeth SQUIBB, MD;  Location: [facility];  Service: Gastroenterology;;    reports that he has never smoked. He has been exposed to tobacco smoke. He has never used smokeless tobacco. He reports that he does not drink alcohol and does not use drugs. family history includes Arthritis in his mother; Cancer in his mother; Diabetes in his maternal grandfather and maternal grandmother; Heart attack in his father; Heart disease in his mother; Hyperlipidemia in his brother, father, and mother; Hypertension in his father and mother. Allergies  Allergen Reactions   Ace Inhibitors Cough    Review of Systems  Constitutional:  Negative for fever and malaise/fatigue.  HENT:  Positive for congestion.   Eyes:  Negative for blurred vision.  Respiratory:  Negative for cough and shortness of breath.   Cardiovascular:  Negative for chest pain.  Genitourinary:  Negative for dysuria.  Neurological:  Positive for dizziness. Negative for weakness and headaches.      Objective:     BP 130/66 (BP Location: Left Arm, Patient Position: Sitting, Cuff Size: Normal)   Pulse 60   Temp 97.8 F (36.6 C) (Oral)   Wt 218 lb 14.4 oz (99.3 kg)   SpO2 97%   BMI 31.41 kg/m  BP Readings from Last 3 Encounters:  08/14/23 130/66   07/12/23 138/84  02/26/23 124/70   Wt Readings from Last 3 Encounters:  08/14/23 218 lb 14.4 oz (99.3 kg)  07/12/23 220 lb 6.4 oz (100 kg)  02/26/23 213 lb 3.2 oz (96.7 kg)      Physical Exam Vitals reviewed.  Constitutional:      General: He is not in acute distress.    Appearance: He is not ill-appearing.  Neck:     Comments: No carotid bruits Cardiovascular:     Rate and Rhythm: Normal rate and regular rhythm.  Pulmonary:     Effort: Pulmonary effort is normal.     Breath sounds: Normal breath sounds.  Musculoskeletal:     Cervical back: Neck supple.  Neurological:     General: No focal deficit present.     Mental Status: He is alert and oriented to person, place, and time.     Cranial Nerves: No cranial nerve deficit.     Motor: No weakness.     Coordination: Coordination normal.     Gait: Gait normal.     Comments: He has bilateral upper extremity tremor which does not extinguish with movement.  Normal finger-to-nose testing.  Gait normal.  Dizziness was not reproduced with patient going from seated to supine with head tilted 45 degrees to the right or left  Psychiatric:        Mood and Affect: Mood normal.        Thought Content: Thought content normal.      No results found for any visits on 08/14/23.    The ASCVD Risk score (Arnett DK, et al., 2019) failed to calculate for the following reasons:   The valid total cholesterol range is 130 to 320 mg/dL    Assessment & Plan:   Patient presents with onset few days ago of dizziness.  By description this sounds like probably some benign vertigo.  Symptoms resolved at this time.  Not reproduced on exam today.  Does not have any red flags such as focal weakness, ataxia, speech changes, visual changes, etc.  He has chronic bilateral upper extremity tremor consistent with his essential tremor.  -Reviewed red flags of things to watch for for more worrisome vertigo -Follow-up for any recurrent vertigo or new  symptoms -Be sure to change positions slowly -Handout on vertigo given  30 minutes were spent with combined face-to-face evaluation, review of medical records, and documentation  Wolm Scarlet, MD

## 2023-08-15 ENCOUNTER — Telehealth: Payer: PPO | Admitting: Family Medicine

## 2023-09-01 DIAGNOSIS — G4734 Idiopathic sleep related nonobstructive alveolar hypoventilation: Secondary | ICD-10-CM | POA: Diagnosis not present

## 2023-10-01 ENCOUNTER — Other Ambulatory Visit: Payer: Self-pay | Admitting: Internal Medicine

## 2023-10-01 DIAGNOSIS — E1165 Type 2 diabetes mellitus with hyperglycemia: Secondary | ICD-10-CM

## 2023-10-02 DIAGNOSIS — G4734 Idiopathic sleep related nonobstructive alveolar hypoventilation: Secondary | ICD-10-CM | POA: Diagnosis not present

## 2023-10-18 ENCOUNTER — Other Ambulatory Visit: Payer: Self-pay | Admitting: Internal Medicine

## 2023-10-18 DIAGNOSIS — E1165 Type 2 diabetes mellitus with hyperglycemia: Secondary | ICD-10-CM

## 2023-10-19 ENCOUNTER — Telehealth: Payer: Self-pay

## 2023-10-19 NOTE — Telephone Encounter (Signed)
 Pharmacy Patient Advocate Encounter   Received notification from CoverMyMeds that prior authorization for OZempic is required/requested.   Insurance verification completed.   The patient is insured through [facility] ADVANTAGE/RX ADVANCE .   Per test claim: PA required; PA submitted to above mentioned insurance via CoverMyMeds Key/confirmation #/[id number] [facility] Status is pending

## 2023-10-22 ENCOUNTER — Other Ambulatory Visit (HOSPITAL_COMMUNITY): Payer: Self-pay

## 2023-10-22 NOTE — Telephone Encounter (Signed)
 Pharmacy Patient Advocate Encounter  Received notification from [facility] ADVANTAGE/RX ADVANCE that Prior Authorization for Ozempic has been APPROVED from 10/19/2023 to 03/14/20026. Ran test claim, Copay is $117.50/90 days. This test claim was processed through [facility]- copay amounts may vary at other pharmacies due to pharmacy/plan contracts, or as the patient moves through the different stages of their insurance plan.

## 2023-10-30 DIAGNOSIS — G4734 Idiopathic sleep related nonobstructive alveolar hypoventilation: Secondary | ICD-10-CM | POA: Diagnosis not present

## 2023-11-01 ENCOUNTER — Encounter: Payer: Self-pay | Admitting: Internal Medicine

## 2023-11-01 ENCOUNTER — Other Ambulatory Visit: Payer: Self-pay | Admitting: Internal Medicine

## 2023-11-01 DIAGNOSIS — E1165 Type 2 diabetes mellitus with hyperglycemia: Secondary | ICD-10-CM

## 2023-11-02 MED ORDER — GLIPIZIDE 5 MG PO TABS: ORAL_TABLET | ORAL | 1 refills | Status: DC

## 2023-11-15 ENCOUNTER — Encounter: Payer: Self-pay | Admitting: Family Medicine

## 2023-11-15 DIAGNOSIS — I10 Essential (primary) hypertension: Secondary | ICD-10-CM

## 2023-11-16 MED ORDER — LOSARTAN POTASSIUM 50 MG PO TABS: 50.0000 mg | ORAL_TABLET | Freq: Every day | ORAL | 0 refills | Status: DC

## 2023-11-22 DIAGNOSIS — C61 Malignant neoplasm of prostate: Secondary | ICD-10-CM | POA: Diagnosis not present

## 2023-11-29 DIAGNOSIS — C61 Malignant neoplasm of prostate: Secondary | ICD-10-CM | POA: Diagnosis not present

## 2023-11-29 DIAGNOSIS — R350 Frequency of micturition: Secondary | ICD-10-CM | POA: Diagnosis not present

## 2023-11-29 DIAGNOSIS — R3915 Urgency of urination: Secondary | ICD-10-CM | POA: Diagnosis not present

## 2023-11-29 DIAGNOSIS — N401 Enlarged prostate with lower urinary tract symptoms: Secondary | ICD-10-CM | POA: Diagnosis not present

## 2023-11-30 ENCOUNTER — Other Ambulatory Visit: Payer: Self-pay | Admitting: Urology

## 2023-11-30 DIAGNOSIS — G4734 Idiopathic sleep related nonobstructive alveolar hypoventilation: Secondary | ICD-10-CM | POA: Diagnosis not present

## 2023-11-30 DIAGNOSIS — C61 Malignant neoplasm of prostate: Secondary | ICD-10-CM

## 2023-12-05 ENCOUNTER — Telehealth: Payer: Self-pay

## 2023-12-05 NOTE — Telephone Encounter (Signed)
 I tried calling patient 2x this morning voicemail full could not leave message [phone]

## 2023-12-05 NOTE — Telephone Encounter (Signed)
 Copied from CRM [id number]. Topic: General - Other >> Dec 05, 2023  1:28 PM Robert Cordova wrote: Reason for CRM: patient does not want to reschedule his medicare wellness visit he stated he has been sitting by the phone and did not get a call at all today

## 2023-12-06 ENCOUNTER — Encounter: Payer: Self-pay | Admitting: Family Medicine

## 2023-12-06 ENCOUNTER — Ambulatory Visit: Admitting: Family Medicine

## 2023-12-06 ENCOUNTER — Ambulatory Visit

## 2023-12-06 VITALS — BP 124/82 | HR 71 | Temp 98.0°F | Ht 70.0 in | Wt 212.6 lb

## 2023-12-06 DIAGNOSIS — I10 Essential (primary) hypertension: Secondary | ICD-10-CM | POA: Diagnosis not present

## 2023-12-06 DIAGNOSIS — C61 Malignant neoplasm of prostate: Secondary | ICD-10-CM

## 2023-12-06 DIAGNOSIS — R0609 Other forms of dyspnea: Secondary | ICD-10-CM

## 2023-12-06 DIAGNOSIS — Z0000 Encounter for general adult medical examination without abnormal findings: Secondary | ICD-10-CM | POA: Diagnosis not present

## 2023-12-06 DIAGNOSIS — M2550 Pain in unspecified joint: Secondary | ICD-10-CM | POA: Diagnosis not present

## 2023-12-06 DIAGNOSIS — E782 Mixed hyperlipidemia: Secondary | ICD-10-CM

## 2023-12-06 DIAGNOSIS — Z794 Long term (current) use of insulin: Secondary | ICD-10-CM | POA: Diagnosis not present

## 2023-12-06 DIAGNOSIS — E1169 Type 2 diabetes mellitus with other specified complication: Secondary | ICD-10-CM

## 2023-12-06 DIAGNOSIS — G4734 Idiopathic sleep related nonobstructive alveolar hypoventilation: Secondary | ICD-10-CM | POA: Diagnosis not present

## 2023-12-06 LAB — COMPREHENSIVE METABOLIC PANEL WITH GFR
ALT: 14 U/L (ref 0–53)
AST: 12 U/L (ref 0–37)
Albumin: 4.5 g/dL (ref 3.5–5.2)
Alkaline Phosphatase: 63 U/L (ref 39–117)
BUN: 16 mg/dL (ref 6–23)
CO2: 27 meq/L (ref 19–32)
Calcium: 9.9 mg/dL (ref 8.4–10.5)
Chloride: 104 meq/L (ref 96–112)
Creatinine, Ser: 0.65 mg/dL (ref 0.40–1.50)
GFR: 92.83 mL/min (ref >60.00)
Glucose, Bld: 113 mg/dL — ABNORMAL HIGH (ref 70–99)
Potassium: 4.3 meq/L (ref 3.5–5.1)
Sodium: 139 meq/L (ref 135–145)
Total Bilirubin: 0.6 mg/dL (ref 0.2–1.2)
Total Protein: 7.1 g/dL (ref 6.0–8.3)

## 2023-12-06 LAB — CBC WITH DIFFERENTIAL/PLATELET
Basophils Absolute: 0.1 10*3/uL (ref 0.0–0.1)
Basophils Relative: 0.9 % (ref 0.0–3.0)
Eosinophils Absolute: 0.1 10*3/uL (ref 0.0–0.7)
Eosinophils Relative: 1.8 % (ref 0.0–5.0)
HCT: 42.7 % (ref 39.0–52.0)
Hemoglobin: 14.4 g/dL (ref 13.0–17.0)
Lymphocytes Relative: 23.5 % (ref 12.0–46.0)
Lymphs Abs: 1.6 10*3/uL (ref 0.7–4.0)
MCHC: 33.7 g/dL (ref 30.0–36.0)
MCV: 93.3 fl (ref 78.0–100.0)
Monocytes Absolute: 0.7 10*3/uL (ref 0.1–1.0)
Monocytes Relative: 9.7 % (ref 3.0–12.0)
Neutro Abs: 4.3 10*3/uL (ref 1.4–7.7)
Neutrophils Relative %: 64.1 % (ref 43.0–77.0)
Platelets: 316 10*3/uL (ref 150.0–400.0)
RBC: 4.58 Mil/uL (ref 4.22–5.81)
RDW: 12.8 % (ref 11.5–15.5)
WBC: 6.7 10*3/uL (ref 4.0–10.5)

## 2023-12-06 LAB — LIPID PANEL
Cholesterol: 113 mg/dL (ref 0–200)
HDL: 47.1 mg/dL (ref >39.00)
LDL Cholesterol: 47 mg/dL (ref 0–99)
NonHDL: 66.01
Total CHOL/HDL Ratio: 2
Triglycerides: 97 mg/dL (ref 0.0–149.0)
VLDL: 19.4 mg/dL (ref 0.0–40.0)

## 2023-12-06 LAB — BRAIN NATRIURETIC PEPTIDE: Pro B Natriuretic peptide (BNP): 16 pg/mL (ref 0.0–100.0)

## 2023-12-06 LAB — T4, FREE: Free T4: 0.84 ng/dL (ref 0.60–1.60)

## 2023-12-06 LAB — TSH: TSH: 0.63 u[IU]/mL (ref 0.35–5.50)

## 2023-12-06 LAB — HEMOGLOBIN A1C: Hgb A1c MFr Bld: 7 % — ABNORMAL HIGH (ref 4.6–6.5)

## 2023-12-06 NOTE — Progress Notes (Unsigned)
 Established Patient Office Visit   Subjective  Patient ID: Robert Cordova, male    DOB: 1951-02-11  Age: 73 y.o. MRN: [id number]  Chief Complaint  Patient presents with  . Annual Exam  . Joint Pain    Stopped using Mobic   . Shortness of Breath    SOB after exercise like climbing stairs      HPI  Patient Active Problem List   Diagnosis Date Noted  . Colon cancer screening 02/22/2023  . Benign neoplasm of colon 02/22/2023  . Dyspnea on exertion 05/07/2018  . Type 2 diabetes mellitus with hyperglycemia, without long-term current use of insulin  (HCC) 01/25/2018  . BPH associated with nocturia 05/15/2017  . Flank pain 04/30/2017  . Kidney stone 04/30/2017  . Multiple thyroid  nodules 10/13/2016  . Nocturnal hypoxemia 06/04/2014  . Lapband APS + hiatus hernia repair April 2015 11/25/2013  . Obesity 11/25/2013  . Goiter 04/18/2013  . Osteoarthritis 07/26/2011  . Blepharitis 10/29/2009  . NEVUS, MELANOCYTIC, FACE 10/07/2009  . Hyperlipemia 10/22/2007  . HEMATURIA UNSPECIFIED 08/09/2007  . Essential hypertension 03/18/2007  . NECK PAIN, CHRONIC 03/18/2007   Past Medical History:  Diagnosis Date  . ABNORMAL GLUCOSE NEC 03/18/2007  . Allergy 2005   Ace inhibitors--coughing  . Anxiety   . Arthritis    fingers  . BLEPHARITIS, LEFT 10/29/2009  . BPH (benign prostatic hyperplasia)   . Cancer [facility])    Prostate 10/2022  . Cataract    both removed  . COPD (chronic obstructive pulmonary disease) (HCC) 2016   Use oxygen  at night  . DIABETES-TYPE 2 04/18/2010  . Dry throat   . Environmental allergies   . GERD (gastroesophageal reflux disease)   . H/O hiatal hernia   . HYPERLIPIDEMIA 10/22/2007  . HYPERTENSION 03/18/2007  . Multiple thyroid  nodules    "biopsy done, everything was fine" being monitored  . Neuromuscular disorder (HCC) 2024   Toes on left fiot  . NEVUS, MELANOCYTIC, FACE 10/07/2009  . Osteoarthritis of both thumbs   . Oxygen  deficiency    Uses O2  concentrator at night  shallow breather   Past Surgical History:  Procedure Laterality Date  . BIOPSY THYROID   ~08/2013  . BREATH TEK H PYLORI N/A 09/05/2013   Procedure: BREATH TEK H PYLORI;  Surgeon: Azucena Bollard, MD;  Location: [facility];  Service: General;  Laterality: N/A;  . CATARACT EXTRACTION Bilateral   . COLONOSCOPY  2009, 2013   Dr. Adan Holms; multiple polyps  . COLONOSCOPY WITH PROPOFOL  N/A 02/22/2023   Procedure: COLONOSCOPY WITH PROPOFOL ;  Surgeon: Ace Holder, MD;  Location: [facility];  Service: Gastroenterology;  Laterality: N/A;  on Oxygen  at night  . EYE SURGERY  2020   Cataracts  . HIATAL HERNIA REPAIR  11/25/2013   Procedure: LAPAROSCOPIC REPAIR OF HIATAL HERNIA;  Surgeon: Azucena Bollard, MD;  Location[facility];  Service: General;;  . LAPAROSCOPIC GASTRIC BANDING N/A 11/25/2013   Procedure: LAPAROSCOPIC GASTRIC BANDING;  Surgeon: Azucena Bollard, MD;  Location[facility];  Service: General;  Laterality: N/A;  . POLYPECTOMY  02/22/2023   Procedure: POLYPECTOMY;  Surgeon: Ace Holder, MD;  Location[facility];  Service: Gastroenterology;;   Social History   Tobacco Use  . Smoking status: Never    Passive exposure: Past  . Smokeless tobacco: Never  Vaping Use  . Vaping status: Never Used  Substance Use Topics  . Alcohol use: No  . Drug use: No   Family History  Problem Relation Age of Onset  . Hyperlipidemia Mother   . Hypertension Mother   . Arthritis Mother   . Cancer Mother   . Heart disease Mother   . COPD Mother   . Stroke Mother   . Hyperlipidemia Father   . Hypertension Father   . Heart attack Father   . Heart disease Father   . Hyperlipidemia Brother   . Diabetes Maternal Grandmother   . Diabetes Maternal Grandfather   . Diabetes Paternal Grandmother   . Colon cancer Neg Hx   . Stomach cancer Neg Hx   . Esophageal cancer Neg Hx   . Rectal cancer Neg Hx   . Colon polyps Neg Hx    Allergies  Allergen  Reactions  . Ace Inhibitors Cough      ROS Negative unless stated above    Objective:     BP 124/82 (BP Location: Left Arm, Patient Position: Sitting, Cuff Size: Normal)   Pulse 71   Temp 98 F (36.7 C) (Oral)   Ht 5\' 10"  (1.778 m)   Wt 212 lb 9.6 oz (96.4 kg)   SpO2 95%   BMI 30.50 kg/m  BP Readings from Last 3 Encounters:  12/06/23 124/82  08/14/23 130/66  07/12/23 138/84   Wt Readings from Last 3 Encounters:  12/06/23 212 lb 9.6 oz (96.4 kg)  08/14/23 218 lb 14.4 oz (99.3 kg)  07/12/23 220 lb 6.4 oz (100 kg)      Physical Exam     12/06/2023    9:51 AM 12/06/2022    2:24 PM 11/29/2022   12:53 PM  Depression screen PHQ 2/9  Decreased Interest 1 2 0  Down, Depressed, Hopeless 1 1 0  PHQ - 2 Score 2 3 0  Altered sleeping 2 3   Tired, decreased energy 2 3   Change in appetite 2 1   Feeling bad or failure about yourself   1   Trouble concentrating 1 3   Moving slowly or fidgety/restless 0 2   Suicidal thoughts 0 0   PHQ-9 Score 9 16   Difficult doing work/chores Somewhat difficult        12/06/2023    9:51 AM 12/06/2022    2:24 PM 09/04/2022   10:05 AM  GAD 7 : Generalized Anxiety Score  Nervous, Anxious, on Edge 1 2 1   Control/stop worrying 1 1 1   Worry too much - different things 1 2 1   Trouble relaxing 0 1 1  Restless 0 2 0  Easily annoyed or irritable 2 3 2   Afraid - awful might happen 1 1 1   Total GAD 7 Score 6 12 7   Anxiety Difficulty Somewhat difficult Very difficult Somewhat difficult     No results found for any visits on 12/06/23.    Assessment & Plan:  Well adult exam -     CBC with Differential/Platelet; Future -     Comprehensive metabolic panel with GFR; Future -     Hemoglobin A1c; Future -     Lipid panel; Future -     TSH; Future -     T4, free; Future  Essential hypertension -     TSH; Future -     T4, free; Future  Type 2 diabetes mellitus with other specified complication, with long-term current use of insulin  (HCC) -      Hemoglobin A1c; Future  Mixed hyperlipidemia -     Lipid panel; Future  Prostate cancer [facility])  Multiple joint pain -  CBC with Differential/Platelet; Future  DOE (dyspnea on exertion) -     CBC with Differential/Platelet; Future -     Comprehensive metabolic panel with GFR; Future -     Brain natriuretic peptide -     DG Chest 2 View; Future  Nocturnal hypoxemia    Return in about 2 months (around 02/05/2024), or if symptoms worsen or fail to improve, for SOB, arthritis.   Viola Greulich, MD

## 2023-12-06 NOTE — Patient Instructions (Addendum)
 Orders for labs were placed.  Will also have you get a chest x-ray just to check on your new shortness of breath.  You can also schedule a follow-up appointment with your pulmonologist to further evaluate this.

## 2023-12-10 ENCOUNTER — Encounter: Payer: Self-pay | Admitting: Pulmonary Disease

## 2023-12-10 ENCOUNTER — Ambulatory Visit: Admitting: Family Medicine

## 2023-12-10 ENCOUNTER — Ambulatory Visit: Admitting: Pulmonary Disease

## 2023-12-10 VITALS — BP 140/66 | HR 71 | Temp 98.1°F | Ht 70.0 in | Wt 214.4 lb

## 2023-12-10 DIAGNOSIS — R0602 Shortness of breath: Secondary | ICD-10-CM

## 2023-12-10 DIAGNOSIS — Z7722 Contact with and (suspected) exposure to environmental tobacco smoke (acute) (chronic): Secondary | ICD-10-CM | POA: Diagnosis not present

## 2023-12-10 NOTE — Progress Notes (Signed)
 Robert Cordova    [id number]    09-05-1950  Primary Care Physician:Banks, Bethene Brought, MD  Referring Physician: Viola Greulich, MD [address] [place],  [place] [address]  Chief complaint:   Patient being seen for an acute visit  HPI:  Did have shortness of breath when he was taking trash out, also did have a recurrence of the shortness of breath the next day but since then has not had recurrence of the symptoms Was able to push mow his lawn No chest pains, no chest discomfort Was not having any shortness of breath prior to or after  Was seen in the primary doctor's office and was ambulated, did desaturate into the high 80s  When he was walked in the office today his saturations was in the high 90s on room air  He does use supplemental oxygen  at night  Did not have significant sleep disordered breathing previously  No significant lung disease known to him  History of diabetes Prostate cancer No significant heart disease  Never smoker  No pertinent occupational history  Outpatient Encounter Medications as of 12/10/2023  Medication Sig   atorvastatin  (LIPITOR) 20 MG tablet Take 1 tablet (20 mg total) by mouth daily.   Blood Glucose Calibration (TRUE METRIX LEVEL 1) Low SOLN Use as instructed   Blood Glucose Monitoring Suppl (TRUE METRIX AIR GLUCOSE METER) w/Device KIT 1 Device by Other route as directed.   Cholecalciferol (VITAMIN D ) 50 MCG (2000 UT) CAPS Take 2,000 Units by mouth daily.   Coenzyme Q10 (CO Q-10) 200 MG CAPS Take 200 mg by mouth daily.   cyanocobalamin  (VITAMIN B12) 1000 MCG tablet Take 1,000 mcg by mouth daily.   glipiZIDE  (GLUCOTROL ) 5 MG tablet TAKE 1 TABLET BY MOUTH TWICE A DAY BEFORE MEALS   insulin  glargine (LANTUS  SOLOSTAR) 100 UNIT/ML Solostar Pen Inject 20-25 Units into the skin at bedtime.   Insulin  Pen Needle (DROPLET PEN NEEDLES) 32G X 4 MM MISC USE TO INJECT ONE TIME DAILY   JARDIANCE  25 MG TABS tablet TAKE 1 TABLET  (25 MG TOTAL) BY MOUTH DAILY.   lidocaine  4 % Place 1 patch onto the skin daily as needed (pain).   losartan  (COZAAR ) 50 MG tablet Take 1 tablet (50 mg total) by mouth daily. Please schedule an Annual  office visit for more refills. thanks   Magnesium 250 MG TABS Take 250 mg by mouth daily.   meloxicam (MOBIC) 7.5 MG tablet Take 7.5 mg by mouth at bedtime.   metFORMIN  (GLUCOPHAGE ) 500 MG tablet TAKE 2 TABLETS BY MOUTH TWICE DAILY WITH MEALS   minoxidil  (ROGAINE ) 2 % external solution Apply 1 application topically daily.   Misc Natural Products (JOINT HEALTH PO) Take 1 tablet by mouth 2 (two) times daily. Osteo Bi-Flex   OXYGEN  Inhale 2-2.5 L into the lungs at bedtime.   Semaglutide , 2 MG/DOSE, (OZEMPIC , 2 MG/DOSE,) 8 MG/3ML SOPN INJECT 2 MG AS DIRECTED ONCE A WEEK.   tamsulosin (FLOMAX) 0.4 MG CAPS capsule Take 0.4 mg by mouth at bedtime.   TRUE METRIX BLOOD GLUCOSE TEST test strip TEST BLOOD SUGAR TWICE DAILY   TRUEplus Lancets 33G MISC Use as instructed two times daily   No facility-administered encounter medications on file as of 12/10/2023.    Allergies as of 12/10/2023 - Review Complete 12/10/2023  Allergen Reaction Noted   Ace inhibitors Cough     Past Medical History:  Diagnosis Date   ABNORMAL GLUCOSE NEC  03/18/2007   Allergy 2005   Ace inhibitors--coughing   Anxiety    Arthritis    fingers   BLEPHARITIS, LEFT 10/29/2009   BPH (benign prostatic hyperplasia)    Cancer (HCC)    Prostate 10/2022   Cataract    both removed   COPD (chronic obstructive pulmonary disease) (HCC) 2016   Use oxygen  at night   DIABETES-TYPE 2 04/18/2010   Dry throat    Environmental allergies    GERD (gastroesophageal reflux disease)    H/O hiatal hernia    HYPERLIPIDEMIA 10/22/2007   HYPERTENSION 03/18/2007   Multiple thyroid  nodules    "biopsy done, everything was fine" being monitored   Neuromuscular disorder (HCC) 2024   Toes on left fiot   NEVUS, MELANOCYTIC, FACE 10/07/2009    Osteoarthritis of both thumbs    Oxygen  deficiency    Uses O2 concentrator at night  shallow breather    Past Surgical History:  Procedure Laterality Date   BIOPSY THYROID   ~08/2013   BREATH TEK H PYLORI N/A 09/05/2013   Procedure: BREATH TEK H PYLORI;  Surgeon: Azucena Bollard, MD;  Location: [facility];  Service: General;  Laterality: N/A;   CATARACT EXTRACTION Bilateral    COLONOSCOPY  2009, 2013   Dr. Adan Holms; multiple polyps   COLONOSCOPY WITH PROPOFOL  N/A 02/22/2023   Procedure: COLONOSCOPY WITH PROPOFOL ;  Surgeon: Ace Holder, MD;  Location: [facility];  Service: Gastroenterology;  Laterality: N/A;  on Oxygen  at night   EYE SURGERY  2020   Cataracts   HIATAL HERNIA REPAIR  11/25/2013   Procedure: LAPAROSCOPIC REPAIR OF HIATAL HERNIA;  Surgeon: Azucena Bollard, MD;  [facility];  Service: General;;   LAPAROSCOPIC GASTRIC BANDING N/A 11/25/2013   Procedure: LAPAROSCOPIC GASTRIC BANDING;  Surgeon: Azucena Bollard, MD;  Location: [facility];  Service: General;  Laterality: N/A;   POLYPECTOMY  02/22/2023   Procedure: POLYPECTOMY;  Surgeon: Ace Holder, MD;  Location[facility];  Service: Gastroenterology;;    Family History  Problem Relation Age of Onset   Hyperlipidemia Mother    Hypertension Mother    Arthritis Mother    Cancer Mother    Heart disease Mother    COPD Mother    Stroke Mother    Hyperlipidemia Father    Hypertension Father    Heart attack Father    Heart disease Father    Hyperlipidemia Brother    Diabetes Maternal Grandmother    Diabetes Maternal Grandfather    Diabetes Paternal Grandmother    Colon cancer Neg Hx    Stomach cancer Neg Hx    Esophageal cancer Neg Hx    Rectal cancer Neg Hx    Colon polyps Neg Hx     Social History   Socioeconomic History   Marital status: Married    Spouse name: Architectural technologist   Number of children: 2   Years of education: 16   Highest education level: Master's degree (e.g.,  MA, MS, MEng, MEd, MSW, MBA)  Occupational History   Occupation: Research scientist (medical)-- self-employeed/[employer]   Occupation: retired  Tobacco Use   Smoking status: Never    Passive exposure: Past   Smokeless tobacco: Never  Vaping Use   Vaping status: Never Used  Substance and Sexual Activity   Alcohol use: No   Drug use: No   Sexual activity: Yes    Partners: Female  Other Topics Concern   Not on file  Social History Narrative   Not on  file   Social Drivers of Health   Financial Resource Strain: Low Risk  (08/13/2023)   Overall Financial Resource Strain (CARDIA)    Difficulty of Paying Living Expenses: Not hard at all  Food Insecurity: No Food Insecurity (08/13/2023)   Hunger Vital Sign    Worried About Running Out of Food in the Last Year: Never true    Ran Out of Food in the Last Year: Never true  Transportation Needs: No Transportation Needs (08/13/2023)   PRAPARE - Administrator, Civil Service (Medical): No    Lack of Transportation (Non-Medical): No  Physical Activity: Sufficiently Active (08/13/2023)   Exercise Vital Sign    Days of Exercise per Week: 1 day    Minutes of Exercise per Session: 150+ min  Stress: No Stress Concern Present (08/13/2023)   [employer] of Occupational Health - Occupational Stress Questionnaire    Feeling of Stress : Only a little  Social Connections: Moderately Isolated (08/13/2023)   Social Connection and Isolation Panel [NHANES]    Frequency of Communication with Friends and Family: More than three times a week    Frequency of Social Gatherings with Friends and Family: Once a week    Attends Religious Services: Never    Database administrator or Organizations: No    Attends Engineer, structural: Not on file    Marital Status: Married  Catering manager Violence: Not At Risk (11/29/2022)   Humiliation, Afraid, Rape, and Kick questionnaire    Fear of Current or Ex-Partner: No    Emotionally Abused: No    Physically Abused: No     Sexually Abused: No    Review of Systems  Constitutional:  Negative for fatigue.  Respiratory:  Negative for chest tightness, shortness of breath and wheezing.     Vitals:   12/10/23 1553  BP: (!) 140/66  Pulse: 71  Temp: 98.1 F (36.7 C)  SpO2: 100%     Physical Exam Constitutional:      Appearance: Normal appearance.  HENT:     Head: Normocephalic.     Nose: Nose normal.  Eyes:     General: No scleral icterus. Cardiovascular:     Rate and Rhythm: Normal rate and regular rhythm.     Heart sounds: No murmur heard.    No friction rub.  Pulmonary:     Effort: No respiratory distress.     Breath sounds: No stridor. No wheezing or rhonchi.  Musculoskeletal:     Cervical back: No rigidity or tenderness.  Neurological:     Mental Status: He is alert.  Psychiatric:        Mood and Affect: Mood normal.    Data Reviewed: PFT from 06/17/2015 reviewed with the patient showing normal PFT with no obstruction, no significant bronchodilator response  Recent chest x-ray 12/06/2023-no significant abnormality  Echocardiogram 03/21/2023 shows normal ejection fraction, grade 1 diastolic dysfunction  Patient was ambulated in the office today and saturations stayed in the high 90s  Sleep study in 2014 was AHI of 3.3  Assessment:  Shortness of breath  Nocturnal hypoxemia  Exercise desaturations  Unclear what was responsible for his transient symptoms - Did not have any chest pain or chest discomfort -Did not have any respiratory complaints before or after events - No significant respiratory history - No history to suggest an arrhythmia - Has had no leg swellings, no recent travels  Plan/Recommendations:  Reassurance was provided  Encouraged him to call if any recurrence  of symptoms  May consider pulmonary function test, echocardiogram if significant  We will repeat his overnight oximetry to ascertain continuing need for oxygen  supplementation  Will hold off on  further testing unless recurrence of symptoms  Myer Artis MD [facility] 12/10/2023, 4:30 PM  CC: Viola Greulich, MD

## 2023-12-10 NOTE — Patient Instructions (Signed)
 Tentative follow-up in 6 months  Overnight oximetry on room air can be scheduled anytime from now  Call us , message us  if any recurrence of symptoms  Irregular heartbeats can cause the symptoms you had Cannot explain the symptoms by any significant lung disease that just goes away by itself Your breathing study was normal the last time you had one done  I do not think we need to do any other testing at present, you are welcome to give us  a call if any recurrence of symptoms -At that time we may want to check the heart, check the breathing -For now, stay active

## 2023-12-12 ENCOUNTER — Encounter: Payer: Self-pay | Admitting: Family Medicine

## 2023-12-14 ENCOUNTER — Other Ambulatory Visit: Payer: Self-pay | Admitting: Family Medicine

## 2023-12-14 DIAGNOSIS — I10 Essential (primary) hypertension: Secondary | ICD-10-CM

## 2023-12-14 DIAGNOSIS — E785 Hyperlipidemia, unspecified: Secondary | ICD-10-CM

## 2023-12-19 ENCOUNTER — Other Ambulatory Visit (HOSPITAL_BASED_OUTPATIENT_CLINIC_OR_DEPARTMENT_OTHER): Payer: Self-pay

## 2023-12-19 MED ORDER — SHINGRIX 50 MCG/0.5ML IM SUSR
0.5000 mL | Freq: Once | INTRAMUSCULAR | 1 refills | Status: AC
  Filled 2023-12-19: qty 0.5, 1d supply, fill #0
  Filled 2024-05-09: qty 0.5, 1d supply, fill #1

## 2023-12-30 DIAGNOSIS — G4734 Idiopathic sleep related nonobstructive alveolar hypoventilation: Secondary | ICD-10-CM | POA: Diagnosis not present

## 2024-01-06 ENCOUNTER — Ambulatory Visit
Admission: RE | Admit: 2024-01-06 | Discharge: 2024-01-06 | Disposition: A | Discharge status: Home / Self Care | Source: Ambulatory Visit | Attending: Urology | Admitting: Urology

## 2024-01-06 DIAGNOSIS — C61 Malignant neoplasm of prostate: Secondary | ICD-10-CM

## 2024-01-06 MED ORDER — GADOPICLENOL 0.5 MMOL/ML IV SOLN
10.0000 mL | Freq: Once | INTRAVENOUS | Status: AC | PRN
  Administered 2024-01-06: 10 mL via INTRAVENOUS

## 2024-01-10 ENCOUNTER — Ambulatory Visit: Payer: PPO | Admitting: Internal Medicine

## 2024-01-10 ENCOUNTER — Encounter: Payer: Self-pay | Admitting: Internal Medicine

## 2024-01-10 VITALS — BP 120/70 | HR 64 | Ht 70.0 in | Wt 211.0 lb

## 2024-01-10 DIAGNOSIS — E782 Mixed hyperlipidemia: Secondary | ICD-10-CM

## 2024-01-10 DIAGNOSIS — Z7984 Long term (current) use of oral hypoglycemic drugs: Secondary | ICD-10-CM | POA: Diagnosis not present

## 2024-01-10 DIAGNOSIS — Z7985 Long-term (current) use of injectable non-insulin antidiabetic drugs: Secondary | ICD-10-CM | POA: Diagnosis not present

## 2024-01-10 DIAGNOSIS — R200 Anesthesia of skin: Secondary | ICD-10-CM | POA: Diagnosis not present

## 2024-01-10 DIAGNOSIS — E042 Nontoxic multinodular goiter: Secondary | ICD-10-CM

## 2024-01-10 DIAGNOSIS — E1165 Type 2 diabetes mellitus with hyperglycemia: Secondary | ICD-10-CM

## 2024-01-10 DIAGNOSIS — Z794 Long term (current) use of insulin: Secondary | ICD-10-CM

## 2024-01-10 NOTE — Patient Instructions (Addendum)
 Please use:  - Metformin  2000 mg with dinner - Glipizide  5 mg before dinner - Jardiance  25 mg before b'fast - Lantus  20 units at bedtime - Ozempic  2 mg weekly  If you have a particularly small dinner >> do not take the Glipizide . If you have a particularly small dinner >> double the Glipizide .  Please return in 4 months with your sugar log.

## 2024-01-10 NOTE — Progress Notes (Signed)
 Patient ID: Robert Cordova, male   DOB: 1951/04/04, 73 y.o.   MRN: [id number]   HPI: Robert Cordova is a 73 y.o.-year-old male, returning for f/u for DM2, dx in 2013, insulin -dependent since 04/2020, uncontrolled, with complications (h/o DKA). Last visit 6 months ago.  Interim history: He continues to have increased urination (he previously saw urology), no blurry vision, nausea, chest pain. He did relax his diet a little bit more lately and saw higher blood sugars. He is preparing to retire.  Reviewed HbA1c levels: Lab Results  Component Value Date   HGBA1C 7.0 (H) 12/06/2023   HGBA1C 6.6 (A) 07/12/2023   HGBA1C 6.6 (A) 01/25/2023  08/22/2016: 7.7%  He is on: - Metformin  1000 mg 2x a day, with meals >> 2000 mg with dinner - Glipizide  10 mg after dinner >> 10 >> 5-10 mg 30 minutes before dinner >> 5 mg before dinner - Jardiance  25 mg before b'fast - Ozempic  0.5 mg weekly-added 09/2018 >> 1 >> 2 mg weekly - Lantus  -started 04/2020 >> .Aaron AasAaron Aas75-24 >> 24 >> 75-25 >> 20 units at bedtime Of note, He was off and on insulin  in the past. He came off when he lost weight after his lab band surgery in 2014 (45 pounds)  He is checking sugars 1-2 times a day: - am: 72-133, 144, 161 >> 84-132 >> 78-141, 154 >> 87-163, 222 - 2h after b'fast: 181, 215 >> n/c >> 100 >> 119 >> n/c - before lunch: 99-122 >> 148 >> n/c >> 148 >> n/c - 2h after lunch: n/c >> 118-215 >> n/c >> 121 >> n/c - before dinner: 108-134 >> 94, 138, 150 >> n/c >> 130 >> n/c - 2h after dinner: n/c >> 208 >> n/c >> 142 >> 143-223, 293 - bedtime:  78-187, 200 >> 67, 68-216 >> 60, 94-207 >> n/c - nighttime: n/c >> 212 >> n/c >> 155 >> 102 Lowest sugar was 63 >> 72 >> 67 >> 60 >> 87; he has hypoglycemia awareness in the 70s. Highest sugar was 200 >> 216 >> 207 >> 293.  Pt's meals are: - Breakfast: Protein shake + coffee and cream - Lunch (12-1): Eggs, bacon or leftovers - Dinner: Meat + veggies + starch, pizza, [place]  food - Snacks: In the evening: cookies, etc. "I snack until I go to bed", craves sweets  -No CKD, last BUN/creatinine:  Lab Results  Component Value Date   BUN 16 12/06/2023   BUN 15 07/12/2023   CREATININE 0.65 12/06/2023   CREATININE 0.73 07/12/2023   Lab Results  Component Value Date   MICRALBCREAT NOTE 07/12/2023   MICRALBCREAT 0.8 09/04/2022   MICRALBCREAT 1.2 05/07/2018   MICRALBCREAT 0.9 05/15/2017   MICRALBCREAT 0.6 11/30/2016   MICRALBCREAT 0.5 08/10/2015   MICRALBCREAT 0.3 08/04/2014   MICRALBCREAT 0.2 04/30/2014   MICRALBCREAT 0.5 04/04/2013   MICRALBCREAT 0.3 01/22/2012  On losartan  25 mg twice a day.  -+ HL; last set of lipids: Lab Results  Component Value Date   CHOL 113 12/06/2023   HDL 47.10 12/06/2023   LDLCALC 47 12/06/2023   LDLDIRECT 72.0 05/07/2018   TRIG 97.0 12/06/2023   CHOLHDL 2 12/06/2023  On Lipitor 20, co-Q10, Krill oil  - last eye exam was in 2024 reportedly No DR, + incipient cataract. [facility].  She sees Dr. Geralyn Knee and Dr. Gerry Krone.  -+ Numbness in  toes - persistent.  Last foot exam 01/25/2023.  B12 was elevated in 01/2020-we increased his supplement dose from 5000  to 2500 mcg daily.  Afterwards, we reduced the dose to  1000 mcg qod. Stopped B complex.  Reviewed his B12 level: Lab Results  Component Value Date   VITAMINB12 1,134 (H) 07/12/2023   VITAMINB12 469 09/04/2022   VITAMINB12 824 08/12/2021   VITAMINB12 599 11/23/2020   VITAMINB12 >1526 (H) 01/06/2020   Thyroid  nodules: - s/p 2 benign biopsies in 2014  Pt denies: - feeling nodules in neck - hoarseness - dysphagia - choking  Reviewed thyroid  imaging results and biopsy results: Thyroid  U/S (10/20/2016): Stable 2.8 cm solid nodule in the inferior right lobe.  This was previously sampled with negative biopsy.  The 1.8 cm nodule previously sampled in the right isthmus was no longer identified by ultrasound.  Thyroid  U/S (01/15/2020): COMPARISON:   10/19/2016  FINDINGS: Parenchymal Echotexture: Mildly heterogenous Isthmus: 0.9 cm Right lobe: 6.1 x 2.6 x 1.9 cm. Left lobe: 5.4 x 2.3 x 1.3 cm _________________________________________________________  Nodule # 2: Prior biopsy: Yes Location: Right; Inferior Maximum size: 3.7 cm; Other 2 dimensions: 3.1 x 2.8 cm, previously, 2.8 x 2.6 x 2.1 cm Composition: solid/almost completely solid (2) Echogenicity: isoechoic (1) Significant change in size (>/= 20% in two dimensions and minimal increase of 2 mm): Yes  **Given size (>/= 2.5 cm) and appearance, fine needle aspiration of this mildly suspicious nodule should be considered based on TI-RADS criteria. _______________________________________________________  There is a stable small hypoechoic nodule measuring approximately 7 mm in the right mid thyroid  gland.  IMPRESSION: Interval increase in size of the dominant right-sided thyroid  nodule, currently measuring 3.7 cm (previously measuring 2.8 cm). While this thyroid  nodule was previously biopsied, consider repeat ultrasound or fine-needle aspiration given the nodule's significant interval growth.  Thyroid  FNA (01/20/2020): FINAL MICROSCOPIC DIAGNOSIS:  - Consistent with benign follicular nodule (Bethesda category II)   SPECIMEN ADEQUACY:  Satisfactory for evaluation   Thyroid  U/S (12/16/2021): Parenchymal Echotexture: Moderately heterogenous  Isthmus: 2.6 cm  Right lobe: 6.1 cm x 2.1 cm x 2.1 cm  Left lobe: 5.0 cm x 2.2 cm x 1.9 cm  _________________________________________________   Nodule labeled 1 superior right thyroid  8 mm, with TR 2/cystic characteristics and does not meet criteria for surveillance.   Nodule labeled 2, lower right thyroid , 4.2 cm. Nodule has been previously biopsied. Assuming benign result, no further specific follow-up would be indicated.   Nodule labeled 3 inferior right thyroid , 1.1 cm with spongiform characteristics. Nodule does not meet  criteria for further surveillance.   Nodule labeled 4, lower left thyroid , 1.4 cm. Nodule has TR 3 characteristics and does not meet criteria for surveillance.   Nodule labeled 5, lower left thyroid , 7 mm with spongiform characteristics and does not meet criteria for surveillance.   Nodule labeled 6, inferior left thyroid , 1.4 cm with TR 3 characteristics. Nodule does not meet criteria for surveillance.   No adenopathy.   Recommendations follow those established by the new ACR TI-RADS criteria (J Am Coll Radiol 2017;14:587-595).   IMPRESSION: Redemonstration of heterogeneous/multinodular thyroid  which may indicate medical thyroid  disease.   Assuming a benign result of the prior biopsy of nodule labeled 2, no further specific follow-up would be indicated.  Latest TSH was normal: Lab Results  Component Value Date   TSH 0.63 12/06/2023   He also has sleep-related hypoxia, HTN.  Also, prostate cancer-followed expectantly. He has a history of gastric banding (11/25/2013).  Weight before surgery: 259 pounds. He has Medicare with 2 supplemental insurances.  ROS: + see HPI  I reviewed  pt's medications, allergies, PMH, social hx, family hx, and changes were documented in the history of present illness. Otherwise, unchanged from my initial visit note.  Past Medical History:  Diagnosis Date   ABNORMAL GLUCOSE NEC 03/18/2007   Allergy 2005   Ace inhibitors--coughing   Anxiety    Arthritis    fingers   BLEPHARITIS, LEFT 10/29/2009   BPH (benign prostatic hyperplasia)    Cancer (HCC)    Prostate 10/2022   Cataract    both removed   COPD (chronic obstructive pulmonary disease) (HCC) 2016   Use oxygen  at night   DIABETES-TYPE 2 04/18/2010   Dry throat    Environmental allergies    GERD (gastroesophageal reflux disease)    H/O hiatal hernia    HYPERLIPIDEMIA 10/22/2007   HYPERTENSION 03/18/2007   Multiple thyroid  nodules    "biopsy done, everything was fine" being monitored    Neuromuscular disorder (HCC) 2024   Toes on left fiot   NEVUS, MELANOCYTIC, FACE 10/07/2009   Osteoarthritis of both thumbs    Oxygen  deficiency    Uses O2 concentrator at night  shallow breather   Past Surgical History:  Procedure Laterality Date   BIOPSY THYROID   ~08/2013   BREATH TEK H PYLORI N/A 09/05/2013   Procedure: BREATH TEK H PYLORI;  Surgeon: Azucena Bollard, MD;  Location: [facility];  Service: General;  Laterality: N/A;   CATARACT EXTRACTION Bilateral    COLONOSCOPY  2009, 2013   Dr. Adan Holms; multiple polyps   COLONOSCOPY WITH PROPOFOL  N/A 02/22/2023   Procedure: COLONOSCOPY WITH PROPOFOL ;  Surgeon: Ace Holder, MD;  Location: [facility];  Service: Gastroenterology;  Laterality: N/A;  on Oxygen  at night   EYE SURGERY  2020   Cataracts   HIATAL HERNIA REPAIR  11/25/2013   Procedure: LAPAROSCOPIC REPAIR OF HIATAL HERNIA;  Surgeon: Azucena Bollard, MD;  Location: [facility];  Service: General;;   LAPAROSCOPIC GASTRIC BANDING N/A 11/25/2013   Procedure: LAPAROSCOPIC GASTRIC BANDING;  Surgeon: Azucena Bollard, MD;  Location: [facility];  Service: General;  Laterality: N/A;   POLYPECTOMY  02/22/2023   Procedure: POLYPECTOMY;  Surgeon: Ace Holder, MD;  Location: [facility];  Service: Gastroenterology;;   Social History   Social History   Marital status: Married    Spouse name: N/A   Number of children: 2   Occupational History   Consultant-- [employer]    Social History Main Topics   Smoking status: Never Smoker   Smokeless tobacco: Never Used   Alcohol use No   Drug use: No   Current Outpatient Medications on File Prior to Visit  Medication Sig Dispense Refill   atorvastatin  (LIPITOR) 20 MG tablet TAKE 1 TABLET BY MOUTH EVERY DAY 90 tablet 2   Blood Glucose Calibration (TRUE METRIX LEVEL 1) Low SOLN Use as instructed 1 each 0   Blood Glucose Monitoring Suppl (TRUE METRIX AIR GLUCOSE METER) w/Device KIT 1 Device by Other route  as directed. 1 kit 0   Cholecalciferol (VITAMIN D ) 50 MCG (2000 UT) CAPS Take 2,000 Units by mouth daily.     Coenzyme Q10 (CO Q-10) 200 MG CAPS Take 200 mg by mouth daily.     cyanocobalamin  (VITAMIN B12) 1000 MCG tablet Take 1,000 mcg by mouth daily.     glipiZIDE  (GLUCOTROL ) 5 MG tablet TAKE 1 TABLET BY MOUTH TWICE A DAY BEFORE MEALS 180 tablet 1   insulin  glargine (LANTUS  SOLOSTAR) 100 UNIT/ML Solostar Pen Inject 20-25 Units into  the skin at bedtime. 30 mL 3   Insulin  Pen Needle (DROPLET PEN NEEDLES) 32G X 4 MM MISC USE TO INJECT ONE TIME DAILY 100 each 3   JARDIANCE  25 MG TABS tablet TAKE 1 TABLET (25 MG TOTAL) BY MOUTH DAILY. 90 tablet 3   lidocaine  4 % Place 1 patch onto the skin daily as needed (pain).     losartan  (COZAAR ) 50 MG tablet TAKE 1 TABLET BY MOUTH DAILY 90 tablet 3   Magnesium 250 MG TABS Take 250 mg by mouth daily.     meloxicam (MOBIC) 7.5 MG tablet Take 7.5 mg by mouth at bedtime.     metFORMIN  (GLUCOPHAGE ) 500 MG tablet TAKE 2 TABLETS BY MOUTH TWICE DAILY WITH MEALS 360 tablet 3   minoxidil  (ROGAINE ) 2 % external solution Apply 1 application topically daily. 60 mL 10   Misc Natural Products (JOINT HEALTH PO) Take 1 tablet by mouth 2 (two) times daily. Osteo Bi-Flex     OXYGEN  Inhale 2-2.5 L into the lungs at bedtime.     Semaglutide , 2 MG/DOSE, (OZEMPIC , 2 MG/DOSE,) 8 MG/3ML SOPN INJECT 2 MG AS DIRECTED ONCE A WEEK. 9 mL 3   tamsulosin (FLOMAX) 0.4 MG CAPS capsule Take 0.4 mg by mouth at bedtime.     TRUE METRIX BLOOD GLUCOSE TEST test strip TEST BLOOD SUGAR TWICE DAILY 200 strip 0   TRUEplus Lancets 33G MISC Use as instructed two times daily 300 each 2   No current facility-administered medications on file prior to visit.   Allergies  Allergen Reactions   Ace Inhibitors Cough   Family History  Problem Relation Age of Onset   Hyperlipidemia Mother    Hypertension Mother    Arthritis Mother    Cancer Mother    Heart disease Mother    COPD Mother    Stroke  Mother    Hyperlipidemia Father    Hypertension Father    Heart attack Father    Heart disease Father    Hyperlipidemia Brother    Diabetes Maternal Grandmother    Diabetes Maternal Grandfather    Diabetes Paternal Grandmother    Colon cancer Neg Hx    Stomach cancer Neg Hx    Esophageal cancer Neg Hx    Rectal cancer Neg Hx    Colon polyps Neg Hx    PE: BP 120/70   Pulse 64   Ht 5\' 10"  (1.778 m)   Wt 211 lb (95.7 kg)   SpO2 97%   BMI 30.28 kg/m  Wt Readings from Last 3 Encounters:  01/10/24 211 lb (95.7 kg)  12/10/23 214 lb 6.4 oz (97.3 kg)  12/06/23 212 lb 9.6 oz (96.4 kg)   Constitutional: overweight, in NAD Eyes: EOMI, no exophthalmos ENT: no thyromegaly but R inf thyroid  nodule palpated, measuring approximately 2.5 cm, no cervical lymphadenopathy Cardiovascular: RRR, No MRG Respiratory: CTA B Musculoskeletal: no deformities Skin: no rashes Neurological: + mild tremor with outstretched hands Diabetic Foot Exam - Simple   Simple Foot Form Diabetic Foot exam was performed with the following findings: Yes 01/10/2024 11:37 AM  Visual Inspection No deformities, no ulcerations, no other skin breakdown bilaterally: Yes Sensation Testing Intact to touch and monofilament testing bilaterally: Yes Pulse Check Posterior Tibialis and Dorsalis pulse intact bilaterally: Yes Comments    ASSESSMENT: 1. DM2, insulin -dependent, uncontrolled, without long term complications, but with hyperglycemia - h/o DKA  2.  Multinodular goiter  3. HL  4.  Numbness in toes/high B12 level  PLAN:  1. Patient with longstanding, uncontrolled, type 2 diabetes, on oral antidiabetic regimen with metformin , sulfonylurea, SGLT2 inhibitor and also daily long-acting insulin  and weekly GLP-1 receptor agonist, with fair control lately.  HbA1c at last visit was stable, at 6.6% so we did not change his regimen, only advised him to not take glipizide  if he had a particularly small dinner after having  some low blood sugars in the 60s during the night.  However, he had another HbA1c obtained last month and this was higher, at 7.0%. - At today's visit, sugars appear to be higher, but he does not have lows anymore.  They are much better in the morning, mostly at goal but with occasional hyperglycemic spikes.  He mentions that he sometimes forgets his oral medications.  This may be the reason why some of his post dinner blood sugars are quite high, up to the 290s.  However, I did advise him to put comments in his log and, if this high blood sugars are caused by certain meals, I advised him to try to take 2 glipizide  tablets before such meals - I suggested to:  Patient Instructions  Please use:  - Metformin  2000 mg with dinner - Glipizide  5 mg before dinner - Jardiance  25 mg before b'fast - Lantus  20 units at bedtime - Ozempic  2 mg weekly  If you have a particularly small dinner >> do not take the Glipizide . If you have a particularly small dinner >> double the Glipizide .  Please return in 4 months with your sugar log.  - advised to check sugars at different times of the day - 1x a day, rotating check times - advised for yearly eye exams >> he is UTD reportedly - return to clinic in 4 months  2. Thyroid  nodules and goiter -Nodules appeared to be stable in size on the ultrasound from 2018, but in 01/2020, a new ultrasound showed an increase in the right inferior dominant nodule size from 2.8 to 3.7 cm.  A biopsy of this nodule was benign.  The latest neck ultrasound from 12/2021 showed that the nodules appear to be low risk and not worrisome.  No further imaging follow-up is needed for this, but we will continue to follow him clinically. - Latest TSH was normal:  Lab Results  Component Value Date   TSH 0.63 12/06/2023  - No neck compression symptoms  3. HL - Latest lipid panel from 12/2023 was at goal: Lab Results  Component Value Date   CHOL 113 12/06/2023   HDL 47.10 12/06/2023    LDLCALC 47 12/06/2023   LDLDIRECT 72.0 05/07/2018   TRIG 97.0 12/06/2023   CHOLHDL 2 12/06/2023  - He is on Lipitor 20 mg daily, Krill oil, CoQ 0, without side effects  4.  Elevated B12 -he had numbness and tingling in his hands and a B12 was found to be low -We started supplementation initially with 5000 mcg daily but we decreased the dose to 1000 mcg every other day since then - Latest B12 was only slightly elevated: Lab Results  Component Value Date   VITAMINB12 1,134 (H) 07/12/2023   Emilie Harden, MD PhD [facility]

## 2024-01-22 DIAGNOSIS — R350 Frequency of micturition: Secondary | ICD-10-CM | POA: Diagnosis not present

## 2024-01-22 DIAGNOSIS — C61 Malignant neoplasm of prostate: Secondary | ICD-10-CM | POA: Diagnosis not present

## 2024-01-22 DIAGNOSIS — N401 Enlarged prostate with lower urinary tract symptoms: Secondary | ICD-10-CM | POA: Diagnosis not present

## 2024-01-22 DIAGNOSIS — R3915 Urgency of urination: Secondary | ICD-10-CM | POA: Diagnosis not present

## 2024-01-30 DIAGNOSIS — G4734 Idiopathic sleep related nonobstructive alveolar hypoventilation: Secondary | ICD-10-CM | POA: Diagnosis not present

## 2024-02-13 ENCOUNTER — Encounter: Payer: Self-pay | Admitting: Family Medicine

## 2024-02-13 ENCOUNTER — Ambulatory Visit (INDEPENDENT_AMBULATORY_CARE_PROVIDER_SITE_OTHER): Admitting: Family Medicine

## 2024-02-13 VITALS — BP 148/74 | HR 62 | Temp 98.0°F | Ht 70.0 in | Wt 210.2 lb

## 2024-02-13 DIAGNOSIS — M25512 Pain in left shoulder: Secondary | ICD-10-CM

## 2024-02-13 DIAGNOSIS — Z794 Long term (current) use of insulin: Secondary | ICD-10-CM

## 2024-02-13 DIAGNOSIS — R0602 Shortness of breath: Secondary | ICD-10-CM | POA: Diagnosis not present

## 2024-02-13 DIAGNOSIS — M1990 Unspecified osteoarthritis, unspecified site: Secondary | ICD-10-CM | POA: Diagnosis not present

## 2024-02-13 DIAGNOSIS — E1169 Type 2 diabetes mellitus with other specified complication: Secondary | ICD-10-CM

## 2024-02-13 DIAGNOSIS — R252 Cramp and spasm: Secondary | ICD-10-CM

## 2024-02-13 DIAGNOSIS — G8929 Other chronic pain: Secondary | ICD-10-CM

## 2024-02-13 DIAGNOSIS — I10 Essential (primary) hypertension: Secondary | ICD-10-CM

## 2024-02-13 DIAGNOSIS — E785 Hyperlipidemia, unspecified: Secondary | ICD-10-CM

## 2024-02-13 DIAGNOSIS — E782 Mixed hyperlipidemia: Secondary | ICD-10-CM

## 2024-02-13 NOTE — Progress Notes (Addendum)
 Established Patient Office Visit   Subjective  Patient ID: Robert Cordova, male    DOB: 03/23/1951  Age: 73 y.o. MRN: [id number]  Chief Complaint  Patient presents with   Medical Management of Chronic Issues    2 month follow-up for SOB and Arthritis     Patient is a 73 year old male seen for follow-up and ongoing concerns.  Since last OFV patient states SOB has improved.  No recent episodes.  Had had a few occasions with going up stairs.  Was seen by pulmonology and advised to return if needed for continued symptoms.  Patient also mentions left shoulder pain increasing with certain movements.  Noted as a 8/10, sharp pain that then becomes a throb with overhead movements.  Patient now more hesitant when reaching overhead.  Had x-ray several years ago that showed the start of glenohumeral arthritis.  Using lidocaine  patches and heat.  Patient mentions occasional muscle cramping at the end of the day.  Especially noted after cutting the grass.  Taking magnesium supplement which helps some.  Will drink Gatorade or other electrolyte replacement which also helps.    Patient Active Problem List   Diagnosis Date Noted   Colon cancer screening 02/22/2023   Benign neoplasm of colon 02/22/2023   Dyspnea on exertion 05/07/2018   Type 2 diabetes mellitus with hyperglycemia, without long-term current use of insulin  (HCC) 01/25/2018   BPH associated with nocturia 05/15/2017   Flank pain 04/30/2017   Kidney stone 04/30/2017   Multiple thyroid  nodules 10/13/2016   Nocturnal hypoxemia 06/04/2014   Lapband APS + hiatus hernia repair April 2015 11/25/2013   Obesity 11/25/2013   Goiter 04/18/2013   Osteoarthritis 07/26/2011   Blepharitis 10/29/2009   NEVUS, MELANOCYTIC, FACE 10/07/2009   Hyperlipemia 10/22/2007   HEMATURIA UNSPECIFIED 08/09/2007   Essential hypertension 03/18/2007   NECK PAIN, CHRONIC 03/18/2007   Past Medical History:  Diagnosis Date   ABNORMAL GLUCOSE NEC 03/18/2007    Allergy 2005   Ace inhibitors--coughing   Anxiety    Arthritis 2025   fingers   BLEPHARITIS, LEFT 10/29/2009   BPH (benign prostatic hyperplasia)    Cancer (HCC) 2024   Prostate 10/2022   Cataract 2020   both removed   COPD (chronic obstructive pulmonary disease) (HCC) 2016   Use oxygen  at night   DIABETES-TYPE 2 04/18/2010   Dry throat    Environmental allergies    GERD (gastroesophageal reflux disease) 2010   H/O hiatal hernia    HYPERLIPIDEMIA 10/22/2007   HYPERTENSION 03/18/2007   Multiple thyroid  nodules    biopsy done, everything was fine being monitored   Neuromuscular disorder (HCC) 2024   Toes on left fiot   NEVUS, MELANOCYTIC, FACE 10/07/2009   Osteoarthritis of both thumbs    Oxygen  deficiency 2016   Uses O2 concentrator at night  shallow breather   Past Surgical History:  Procedure Laterality Date   BIOPSY THYROID   ~08/2013   BREATH TEK H PYLORI N/A 09/05/2013   Procedure: BREATH TEK H PYLORI;  Surgeon: Donnice KATHEE Lunger, MD;  Location: [facility];  Service: General;  Laterality: N/A;   CATARACT EXTRACTION Bilateral    COLONOSCOPY  2009, 2013   Dr. Jakie; multiple polyps   COLONOSCOPY WITH PROPOFOL  N/A 02/22/2023   Procedure: COLONOSCOPY WITH PROPOFOL ;  Surgeon: Leigh Elspeth SQUIBB, MD;  Location: [facility];  Service: Gastroenterology;  Laterality: N/A;  on Oxygen  at night   EYE SURGERY  2020   Cataracts   HIATAL HERNIA REPAIR  11/25/2013   Procedure: LAPAROSCOPIC REPAIR OF HIATAL HERNIA;  Surgeon: Donnice KATHEE Lunger, MD;  Location: [facility];  Service: General;;   LAPAROSCOPIC GASTRIC BANDING N/A 11/25/2013   Procedure: LAPAROSCOPIC GASTRIC BANDING;  Surgeon: Donnice KATHEE Lunger, MD;  Location: [facility];  Service: General;  Laterality: N/A;   POLYPECTOMY  02/22/2023   Procedure: POLYPECTOMY;  Surgeon: Leigh Elspeth SQUIBB, MD;  Location: [facility];  Service: Gastroenterology;;   Social History   Tobacco Use   Smoking status: Never    Passive  exposure: Past   Smokeless tobacco: Never  Vaping Use   Vaping status: Never Used  Substance Use Topics   Alcohol use: No   Drug use: No   Family History  Problem Relation Age of Onset   Hyperlipidemia Mother    Hypertension Mother    Arthritis Mother    Cancer Mother    Heart disease Mother    COPD Mother    Stroke Mother    Hyperlipidemia Father    Hypertension Father    Heart attack Father        Extenuating circumstances   Heart disease Father    Hyperlipidemia Brother    Diabetes Maternal Grandmother    Diabetes Maternal Grandfather    Diabetes Paternal Grandmother    Colon cancer Neg Hx    Stomach cancer Neg Hx    Esophageal cancer Neg Hx    Rectal cancer Neg Hx    Colon polyps Neg Hx    Allergies  Allergen Reactions   Ace Inhibitors Cough    ROS Negative unless stated above    Objective:     BP (!) 148/74 (BP Location: Left Arm, Patient Position: Sitting, Cuff Size: Normal)   Pulse 62   Temp 98 F (36.7 C) (Oral)   Ht 5' 10 (1.778 m)   Wt 210 lb 3.2 oz (95.3 kg)   SpO2 94%   BMI 30.16 kg/m  BP Readings from Last 3 Encounters:  02/13/24 (!) 148/74  01/10/24 120/70  12/10/23 (!) 140/66   Wt Readings from Last 3 Encounters:  02/13/24 210 lb 3.2 oz (95.3 kg)  01/10/24 211 lb (95.7 kg)  12/10/23 214 lb 6.4 oz (97.3 kg)      Physical Exam Constitutional:      General: He is not in acute distress.    Appearance: Normal appearance.  HENT:     Head: Normocephalic and atraumatic.     Nose: Nose normal.     Mouth/Throat:     Mouth: Mucous membranes are moist.  Cardiovascular:     Rate and Rhythm: Normal rate and regular rhythm.     Heart sounds: Normal heart sounds. No murmur heard.    No gallop.  Pulmonary:     Effort: Pulmonary effort is normal. No respiratory distress.     Breath sounds: Normal breath sounds. No wheezing, rhonchi or rales.  Musculoskeletal:     Right shoulder: Normal. Normal strength.     Left shoulder: Normal. Normal  strength.     Comments: TTP of bilateral shoulders, clavicles, cervical, thoracic, or lumbar spine.  F ROM active and passive.  Hesitation of left shoulder with overhead reaching.  No grinding, crepitus, weakness in bilateral shoulders/UEs.  Skin:    General: Skin is warm and dry.  Neurological:     Mental Status: He is alert and oriented to person, place, and time.        02/13/2024    9:44 AM 12/06/2023  9:51 AM 12/06/2022    2:24 PM  Depression screen PHQ 2/9  Decreased Interest 2 1 2   Down, Depressed, Hopeless 0 1 1  PHQ - 2 Score 2 2 3   Altered sleeping 3 2 3   Tired, decreased energy 2 2 3   Change in appetite 2 2 1   Feeling bad or failure about yourself  0  1  Trouble concentrating 2 1 3   Moving slowly or fidgety/restless 2 0 2  Suicidal thoughts 0 0 0  PHQ-9 Score 13 9 16   Difficult doing work/chores  Somewhat difficult       02/13/2024    9:45 AM 12/06/2023    9:51 AM 12/06/2022    2:24 PM 09/04/2022   10:05 AM  GAD 7 : Generalized Anxiety Score  Nervous, Anxious, on Edge 0 1 2 1   Control/stop worrying 0 1 1 1   Worry too much - different things 1 1 2 1   Trouble relaxing 0 0 1 1  Restless 0 0 2 0  Easily annoyed or irritable 2 2 3 2   Afraid - awful might happen 0 1 1 1   Total GAD 7 Score 3 6 12 7   Anxiety Difficulty Somewhat difficult Somewhat difficult Very difficult Somewhat difficult     No results found for any visits on 02/13/24.    Assessment & Plan:   Chronic left shoulder pain  Type 2 diabetes mellitus with other specified complication, with long-term current use of insulin  (HCC)  Arthritis  Essential hypertension  SOB (shortness of breath)  Muscle cramps  Patient with chronic left shoulder pain likely 2/2 OA.  X-ray of left shoulder from 06/04/2017 reviewed with possible early arthritis of glenohumeral joint and some degenerative changes at the [facility] joint.  Discussed continuing supportive care including lidocaine  patches, topical analgesics such as  Aspercreme, Biofreeze, IcyHot.  Also continue heat and consider stretching.  Discussed using Voltaren gel as needed.  Offered imaging.  Patient declines at this time is wishes to wait for possible Ortho appointment if needed.  Will place referral if desired.  BP elevated.  Recheck.  Typically well-controlled.  Continue current medication and lifestyle changes.  Continue losartan  50 mg daily  SOB resolved.  Continue to monitor.  Follow-up with pulm if needed.  Muscle cramps after increased activities likely 2/2 electrolyte deficiency.  Continue supplement.  For continued or worsening myalgias consider holding Lipitor to see if improvement noticed.  Return if symptoms worsen or fail to improve.   Clotilda JONELLE Single, MD

## 2024-02-29 ENCOUNTER — Other Ambulatory Visit: Payer: Self-pay | Admitting: Internal Medicine

## 2024-02-29 DIAGNOSIS — E1165 Type 2 diabetes mellitus with hyperglycemia: Secondary | ICD-10-CM

## 2024-02-29 DIAGNOSIS — G4734 Idiopathic sleep related nonobstructive alveolar hypoventilation: Secondary | ICD-10-CM | POA: Diagnosis not present

## 2024-03-14 ENCOUNTER — Other Ambulatory Visit: Payer: Self-pay | Admitting: Internal Medicine

## 2024-03-14 DIAGNOSIS — E1165 Type 2 diabetes mellitus with hyperglycemia: Secondary | ICD-10-CM

## 2024-03-26 DIAGNOSIS — L814 Other melanin hyperpigmentation: Secondary | ICD-10-CM | POA: Diagnosis not present

## 2024-03-26 DIAGNOSIS — L821 Other seborrheic keratosis: Secondary | ICD-10-CM | POA: Diagnosis not present

## 2024-03-26 DIAGNOSIS — L2989 Other pruritus: Secondary | ICD-10-CM | POA: Diagnosis not present

## 2024-03-26 DIAGNOSIS — L820 Inflamed seborrheic keratosis: Secondary | ICD-10-CM | POA: Diagnosis not present

## 2024-03-26 DIAGNOSIS — L538 Other specified erythematous conditions: Secondary | ICD-10-CM | POA: Diagnosis not present

## 2024-03-26 DIAGNOSIS — D225 Melanocytic nevi of trunk: Secondary | ICD-10-CM | POA: Diagnosis not present

## 2024-03-27 ENCOUNTER — Ambulatory Visit (INDEPENDENT_AMBULATORY_CARE_PROVIDER_SITE_OTHER): Admitting: Family Medicine

## 2024-03-27 ENCOUNTER — Encounter: Payer: Self-pay | Admitting: Family Medicine

## 2024-03-27 VITALS — Wt 210.0 lb

## 2024-03-27 DIAGNOSIS — Z0000 Encounter for general adult medical examination without abnormal findings: Secondary | ICD-10-CM

## 2024-03-27 NOTE — Progress Notes (Signed)
 PATIENT CHECK-IN and HEALTH RISK ASSESSMENT QUESTIONNAIRE:  -completed by phone/video for upcoming Medicare Preventive Visit  Pre-Visit Check-in: 1)Vitals (height, wt, BP, etc) - record in vitals section for visit on day of visit Request home vitals (wt, BP, etc.) and enter into vitals, THEN update Vital Signs SmartPhrase below at the top of the HPI. See below.  2)Review and Update Medications, Allergies PMH, Surgeries, Social history in Epic 3)Hospitalizations in the last year with date/reason? no  4)Review and Update Care Team (patient's specialists) in Epic 5) Complete PHQ9 in Epic  6) Complete Fall Screening in Epic 7)Review all Health Maintenance Due and order if not done.  Medicare Wellness Patient Questionnaire:  Answer theses question about your habits: How often do you have a drink containing alcohol?n/a How many drinks containing alcohol do you have on a typical day when you are drinking?n/a How often do you have six or more drinks on one occasion?n/a Have you ever smoked?no Quit date if applicable? na  How many packs a day do/did you smoke? na Do you use smokeless tobacco?no Do you use an illicit drugs?no  On average, how many days per week do you engage in moderate to strenuous exercise (like a brisk walk)?does a lot of yard work On average, how many minutes do you engage in exercise at this level? Sometimes out there all day Are you sexually active? No Number of partners?n/a Typical breakfast: Protein shake (prime protein shake) and coffee  Admits diet is not great, but does try to stay away from red meat and fries. Typical lunch: Varies  Typical dinner: Varies  Typical snacks: n/a  Beverages: Iced tea  Answer theses question about your everyday activities: Can you perform most household chores?no Are you deaf or have significant trouble hearing?no Do you feel that you have a problem with memory?no Do you feel safe at home?yes Last dentist visit? 2 months ago  8.  Do you have any difficulty performing your everyday activities?no Are you having any difficulty walking, taking medications on your own, and or difficulty managing daily home needs?no Do you have difficulty walking or climbing stairs?no Do you have difficulty dressing or bathing?no Do you have difficulty doing errands alone such as visiting a doctor's office or shopping?no Do you currently have any difficulty preparing food and eating?no Do you currently have any difficulty using the toilet?no Do you have any difficulty managing your finances?no Do you have any difficulties with housekeeping of managing your housekeeping?no   Do you have Advanced Directives in place (Living Will, Healthcare Power or Attorney)?  no   Last eye Exam and location? Unknown    Do you currently use prescribed or non-prescribed narcotic or opioid pain medications? no  Do you have a history or close family history of breast, ovarian, tubal or peritoneal cancer or a family member with BRCA (breast cancer susceptibility 1 and 2) gene mutations?no   Nurse/Assistant Credentials/time stamp: Mg 11:33 AM    ----------------------------------------------------------------------------------------------------------------------------------------------------------------------------------------------------------------------  Because this visit was a virtual/telehealth visit, some criteria may be missing or patient reported. Any vitals not documented were not able to be obtained and vitals that have been documented are patient reported.    MEDICARE ANNUAL PREVENTIVE CARE VISIT WITH PROVIDER (Welcome to Medicare, initial annual wellness or annual wellness exam)  Virtual Visit via Phone Note  I connected with Robert Cordova on 03/27/24  by phone and verified that I am speaking with the correct person using two identifiers. Prefers phone.   Location patient: home  Location provider:work or home office Persons  participating in the virtual visit: patient, provider  Concerns and/or follow up today: stable, no new concerns.    See HM section in Epic for other details of completed HM.    ROS: negative for report of fevers, unintentional weight loss, vision changes, vision loss, hearing loss or change, chest pain, sob (no changes - sees pulmonology for this), hemoptysis, melena, hematochezia, hematuria, falls, bleeding or bruising, thoughts of suicide or self harm, memory loss  Patient-completed extensive health risk assessment - reviewed and discussed with the patient: See Health Risk Assessment completed with patient prior to the visit either above or in recent phone note. This was reviewed in detailed with the patient today and appropriate recommendations, orders and referrals were placed as needed per Summary below and patient instructions.   Review of Medical History: -PMH, PSH, Family History and current specialty and care providers reviewed and updated and listed below   Patient Care Team: Mercer Clotilda SAUNDERS, MD as PCP - General (Family Medicine) Verlin Lonni BIRCH, MD as PCP - Cardiology (Cardiology) [facility], [place], [place]   Past Medical History:  Diagnosis Date   ABNORMAL GLUCOSE NEC 03/18/2007   Allergy 2005   Ace inhibitors--coughing   Anxiety    Arthritis 2025   fingers   BLEPHARITIS, LEFT 10/29/2009   BPH (benign prostatic hyperplasia)    Cancer (HCC) 2024   Prostate 10/2022   Cataract 2020   both removed   COPD (chronic obstructive pulmonary disease) (HCC) 2016   Use oxygen  at night   DIABETES-TYPE 2 04/18/2010   Dry throat    Environmental allergies    GERD (gastroesophageal reflux disease) 2010   H/O hiatal hernia    HYPERLIPIDEMIA 10/22/2007   HYPERTENSION 03/18/2007   Multiple thyroid  nodules    biopsy done, everything was fine being monitored   Neuromuscular disorder (HCC) 2024   Toes on left fiot   NEVUS, MELANOCYTIC, FACE 10/07/2009    Osteoarthritis of both thumbs    Oxygen  deficiency 2016   Uses O2 concentrator at night  shallow breather    Past Surgical History:  Procedure Laterality Date   BIOPSY THYROID   ~08/2013   BREATH TEK H PYLORI N/A 09/05/2013   Procedure: BREATH TEK H PYLORI;  Surgeon: Donnice KATHEE Lunger, MD;  Location: [facility];  Service: General;  Laterality: N/A;   CATARACT EXTRACTION Bilateral    COLONOSCOPY  2009, 2013   Dr. Jakie; multiple polyps   COLONOSCOPY WITH PROPOFOL  N/A 02/22/2023   Procedure: COLONOSCOPY WITH PROPOFOL ;  Surgeon: Leigh Elspeth SQUIBB, MD;  Location: [facility];  Service: Gastroenterology;  Laterality: N/A;  on Oxygen  at night   EYE SURGERY  2020   Cataracts   HIATAL HERNIA REPAIR  11/25/2013   Procedure: LAPAROSCOPIC REPAIR OF HIATAL HERNIA;  Surgeon: Donnice KATHEE Lunger, MD;  Location: [facility];  Service: General;;   LAPAROSCOPIC GASTRIC BANDING N/A 11/25/2013   Procedure: LAPAROSCOPIC GASTRIC BANDING;  Surgeon: Donnice KATHEE Lunger, MD;  Location: [facility];  Service: General;  Laterality: N/A;   POLYPECTOMY  02/22/2023   Procedure: POLYPECTOMY;  Surgeon: Leigh Elspeth SQUIBB, MD;  Location: [facility];  Service: Gastroenterology;;    Social History   Socioeconomic History   Marital status: Married    Spouse name: Angeline Cordova   Number of children: 2   Years of education: 16   Highest education level: Master's degree (e.g., MA, MS, MEng, MEd, MSW, MBA)  Occupational History   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[employer]   Occupation: retired  Tobacco Use   Smoking status: Never    Passive exposure: Past   Smokeless tobacco: Never  Vaping Use   Vaping status: Never Used  Substance and Sexual Activity   Alcohol use: No   Drug use: No   Sexual activity: Yes    Partners: Female  Other Topics Concern   Not on file  Social History Narrative   Not on file   Social Drivers of Health   Financial Resource Strain: Low Risk  (03/27/2024)   Overall  Financial Resource Strain (CARDIA)    Difficulty of Paying Living Expenses: Not hard at all  Food Insecurity: No Food Insecurity (03/27/2024)   Hunger Vital Sign    Worried About Running Out of Food in the Last Year: Never true    Ran Out of Food in the Last Year: Never true  Transportation Needs: No Transportation Needs (03/27/2024)   PRAPARE - Administrator, Civil Service (Medical): No    Lack of Transportation (Non-Medical): No  Physical Activity: Sufficiently Active (03/27/2024)   Exercise Vital Sign    Days of Exercise per Week: 1 day    Minutes of Exercise per Session: 150+ min  Stress: Stress Concern Present (03/27/2024)   [employer] of Occupational Health - Occupational Stress Questionnaire    Feeling of Stress: To some extent  Social Connections: Moderately Isolated (03/27/2024)   Social Connection and Isolation Panel    Frequency of Communication with Friends and Family: More than three times a week    Frequency of Social Gatherings with Friends and Family: More than three times a week    Attends Religious Services: Never    Database administrator or Organizations: No    Attends Engineer, structural: Not on file    Marital Status: Married  Catering manager Violence: Not At Risk (03/27/2024)   Humiliation, Afraid, Rape, and Kick questionnaire    Fear of Current or Ex-Partner: No    Emotionally Abused: No    Physically Abused: No    Sexually Abused: No    Family History  Problem Relation Age of Onset   Hyperlipidemia Mother    Hypertension Mother    Arthritis Mother    Cancer Mother    Heart disease Mother    COPD Mother    Stroke Mother    Hyperlipidemia Father    Hypertension Father    Heart attack Father        Extenuating circumstances   Heart disease Father    Hyperlipidemia Brother    Diabetes Maternal Grandmother    Diabetes Maternal Grandfather    Diabetes Paternal Grandmother    Colon cancer Neg Hx    Stomach cancer Neg Hx     Esophageal cancer Neg Hx    Rectal cancer Neg Hx    Colon polyps Neg Hx     Current Outpatient Medications on File Prior to Visit  Medication Sig Dispense Refill   atorvastatin  (LIPITOR) 20 MG tablet TAKE 1 TABLET BY MOUTH EVERY DAY 90 tablet 2   Blood Glucose Calibration (TRUE METRIX LEVEL 1) Low SOLN Use as instructed 1 each 0   Blood Glucose Monitoring Suppl (TRUE METRIX AIR GLUCOSE METER) w/Device KIT 1 Device by Other route as directed. 1 kit 0   Cholecalciferol (VITAMIN D ) 50 MCG (2000 UT) CAPS Take 2,000 Units by mouth daily.     Coenzyme Q10 (CO Q-10) 200 MG CAPS Take 200 mg by  mouth daily.     cyanocobalamin  (VITAMIN B12) 1000 MCG tablet Take 1,000 mcg by mouth daily.     glipiZIDE  (GLUCOTROL ) 5 MG tablet TAKE 1 TABLET BY MOUTH TWICE A DAY BEFORE MEALS 180 tablet 1   insulin  glargine (LANTUS  SOLOSTAR) 100 UNIT/ML Solostar Pen INJECT 20-24 UNITS INTO THE SKIN AT BEDTIME. 15 mL 5   Insulin  Pen Needle (DROPLET PEN NEEDLES) 32G X 4 MM MISC USE TO INJECT ONE TIME DAILY 100 each 3   JARDIANCE  25 MG TABS tablet TAKE 1 TABLET (25 MG TOTAL) BY MOUTH DAILY. 90 tablet 3   lidocaine  4 % Place 1 patch onto the skin daily as needed (pain).     losartan  (COZAAR ) 50 MG tablet TAKE 1 TABLET BY MOUTH DAILY 90 tablet 3   Magnesium 250 MG TABS Take 250 mg by mouth daily.     metFORMIN  (GLUCOPHAGE ) 500 MG tablet TAKE 2 TABLETS BY MOUTH TWICE DAILY WITH MEALS 360 tablet 3   minoxidil  (ROGAINE ) 2 % external solution Apply 1 application topically daily. 60 mL 10   Misc Natural Products (JOINT HEALTH PO) Take 1 tablet by mouth 2 (two) times daily. Osteo Bi-Flex     MYRBETRIQ 50 MG TB24 tablet Take 50 mg by mouth daily.     OXYGEN  Inhale 2-2.5 L into the lungs at bedtime.     Semaglutide , 2 MG/DOSE, (OZEMPIC , 2 MG/DOSE,) 8 MG/3ML SOPN INJECT 2 MG AS DIRECTED ONCE A WEEK. 9 mL 3   tamsulosin (FLOMAX) 0.4 MG CAPS capsule Take 0.4 mg by mouth at bedtime.     TRUE METRIX BLOOD GLUCOSE TEST test strip TEST  BLOOD SUGAR TWICE DAILY 200 strip 0   TRUEplus Lancets 33G MISC Use as instructed two times daily 300 each 2   No current facility-administered medications on file prior to visit.    Allergies  Allergen Reactions   Ace Inhibitors Cough       Physical Exam Vitals requested from patient and listed below if patient had equipment and was able to obtain at home for this virtual visit: There were no vitals filed for this visit. Estimated body mass index is 30.13 kg/m as calculated from the following:   Height as of 02/13/24: 5' 10 (1.778 m).   Weight as of this encounter: 210 lb (95.3 kg).  EKG (optional): deferred due to virtual visit  GENERAL: alert, oriented, no acute distress detected; full vision exam deferred due to pandemic and/or virtual encounter  PSYCH/NEURO: pleasant and cooperative, no obvious depression or anxiety, speech and thought processing grossly intact, Cognitive function grossly intact  Flowsheet Row Office Visit [facility]/27/20[facility]  PHQ-9 Total Score 7        03/27/2024   11:25 AM 02/13/2024    9:44 AM 12/06/2023    9:51 AM 12/06/2022    2:24 PM 11/29/2022   12:53 PM  Depression screen PHQ 2/9  Decreased Interest 1 2 1 2  0  Down, Depressed, Hopeless 1 0 1 1 0  PHQ - 2 Score 2 2 2 3  0  Altered sleeping 0 3 2 3    Tired, decreased energy 3 2 2 3    Change in appetite 0 2 2 1    Feeling bad or failure about yourself  0 0  1   Trouble concentrating 2 2 1 3    Moving slowly or fidgety/restless 0 2 0 2   Suicidal thoughts 0 0 0 0   PHQ-9 Score 7  13 9 16    Difficult doing work/chores Somewhat difficult  Somewhat difficult    Improved from in the past.      12/02/2023    7:47 AM 12/06/2023    9:51 AM 02/13/2024    9:44 AM 03/23/2024   10:24 AM 03/27/2024   11:21 AM  Fall Risk  Falls in the past year? 0 0 0 0 0  Was there an injury with Fall? 0 0 0 0 0  Fall Risk Category Calculator 0  0 0 0  0  Patient at Risk for Falls  Due to  No Fall Risks No Fall Risks  No Fall Risks  Fall risk Follow up  Falls evaluation completed Falls evaluation completed  Falls evaluation completed     Patient-reported     SUMMARY AND PLAN:  Encounter for Medicare annual wellness exam   Discussed applicable health maintenance/preventive health measures and advised and referred or ordered per patient preferences: -he plans top schedule eye exam -discussed vaccines due recs/risks - advised to bring proof of receipt if gets any at the pharmacy Health Maintenance  Topic Date Due   OPHTHALMOLOGY EXAM  06/14/2023   COVID-19 Vaccine (8 - Pfizer risk 2024-25 season) 02/18/2024   INFLUENZA VACCINE  03/07/2024   Zoster Vaccines- Shingrix  (2 of 2) 05/15/2024 (Originally 02/13/2024)   DTaP/Tdap/Td (6 - Td or Tdap) 05/07/2024   HEMOGLOBIN A1C  06/07/2024   Diabetic kidney evaluation - Urine ACR  07/11/2024   Diabetic kidney evaluation - eGFR measurement  12/05/2024   FOOT EXAM  01/09/2025   Medicare Annual Wellness (AWV)  03/27/2025   Colonoscopy  02/22/2028   Pneumococcal Vaccine: 50+ Years  Completed   Hepatitis C Screening  Completed   HPV VACCINES  Aged Out   Meningococcal B Vaccine  Aged Out   Hepatitis B Vaccines 19-59 Average Risk  Discontinued     Education and counseling on the following was provided based on the above review of health and a plan/checklist for the patient, along with additional information discussed, was provided for the patient in the patient instructions :  -Advised on importance of completing advanced directives, discussed options for completing and provided information in patient instructions as well -Advised and counseled on a healthy lifestyle - including the importance of a healthy diet, regular physical activity -depression screening improved, but still gets tired, discussed etiologies of fatigue and advised follow up in office if out of the ordinary, also discussed checking O2 when feeling fatigued  and letting Dr. Mercer or pulm know if low. Counseling info provided in pt instructions. -Reviewed patient's current diet. Advised and counseled on a whole foods based healthy diet. A summary of a healthy diet was provided in the Patient Instructions.  -reviewed patient's current physical activity level and discussed exercise guidelines for adults. Currently getting a lot of exercise with yard work. Discussed staying hydrated and avoiding heat exhaustion/heat related issues. Further resources provided in pt instructions.  -Advise yearly dental visits at minimum and regular eye exams  Follow up: see patient instructions   Patient Instructions  I really enjoyed getting to talk with you today! I am available on Tuesdays and Thursdays for virtual visits if you have any questions or concerns, or if I can be of any further assistance.   CHECKLIST FROM ANNUAL WELLNESS VISIT:  -Follow up (please call to schedule if not scheduled after visit):   -yearly for annual wellness visit with primary care office  Here is a list of  your preventive care/health maintenance measures and the plan for each if any are due:  PLAN For any measures below that may be due:    1. Please schedule Eye exam with eye doctor.   2. Can get vaccines at the pharmacy if you wish - please provide proof of receipt if you do so that we can update your record.   Health Maintenance  Topic Date Due   OPHTHALMOLOGY EXAM  06/14/2023   COVID-19 Vaccine (8 - Pfizer risk 2024-25 season) 02/18/2024   INFLUENZA VACCINE  03/07/2024   Zoster Vaccines- Shingrix  (2 of 2) 05/15/2024 (Originally 02/13/2024)   DTaP/Tdap/Td (6 - Td or Tdap) 05/07/2024   HEMOGLOBIN A1C  06/07/2024   Diabetic kidney evaluation - Urine ACR  07/11/2024   Diabetic kidney evaluation - eGFR measurement  12/05/2024   FOOT EXAM  01/09/2025   Medicare Annual Wellness (AWV)  03/27/2025   Colonoscopy  02/22/2028   Pneumococcal Vaccine: 50+ Years  Completed   Hepatitis C  Screening  Completed   HPV VACCINES  Aged Out   Meningococcal B Vaccine  Aged Out   Hepatitis B Vaccines 19-59 Average Risk  Discontinued    -See a dentist at least yearly  -Get your eyes checked and then per your eye specialist's recommendations  -Other issues addressed today:   -I have included below further information regarding a healthy whole foods based diet, physical activity guidelines for adults, stress management and opportunities for social connections. I hope you find this information useful.   -----------------------------------------------------------------------------------------------------------------------------------------------------------------------------------------------------------------------------------------------------------    NUTRITION: -eat real food: lots of colorful vegetables (half the plate) and fruits -5-7 servings of vegetables and fruits per day (fresh or steamed is best), exp. 2 servings of vegetables with lunch and dinner and 2 servings of fruit per day. Berries and greens such as kale and collards are great choices.  -consume on a regular basis:  fresh fruits, fresh veggies, fish, nuts, seeds, healthy oils (such as olive oil, avocado oil), whole grains (make sure for bread/pasta/crackers/etc., that the first ingredient on label contains the word whole), legumes. -can eat small amounts of dairy and lean meat (no larger than the palm of your hand), but avoid processed meats such as ham, bacon, lunch meat, etc. -drink water -try to avoid fast food and pre-packaged foods, processed meat, ultra processed foods/beverages (donuts, candy, etc.) -most experts advise limiting sodium to < 2300mg  per day, should limit further is any chronic conditions such as high blood pressure, heart disease, diabetes, etc. The American Heart Association advised that < 1500mg  is is ideal -try to avoid foods/beverages that contain any ingredients with names you do not  recognize  -try to avoid foods/beverages  with added sugar or sweeteners/sweets  -try to avoid sweet drinks (including diet drinks): soda, juice, Gatorade, sweet tea, power drinks, diet drinks -try to avoid white rice, white bread, pasta (unless whole grain)  EXERCISE GUIDELINES FOR ADULTS: -if you wish to increase your physical activity, do so gradually and with the approval of your doctor -STOP and seek medical care immediately if you have any chest pain, chest discomfort or trouble breathing when starting or increasing exercise  -move and stretch your body, legs, feet and arms when sitting for long periods -Physical activity guidelines for optimal health in adults: -get at least 150 minutes per week of moderate exercise (can talk, but not sing); this is about 20-30 minutes of sustained activity 5-7 days per week or two 10-15 minute episodes of sustained activity 5-7  days per week -do some muscle building/resistance training/strength training at least 2 days per week  -balance exercises 3+ days per week:   Stand somewhere where you have something sturdy to hold onto if you lose balance    1) lift up on toes, then back down, start with 5x per day and work up to 20x   2) stand and lift one leg straight out to the side so that foot is a few inches of the floor, start with 5x each side and work up to 20x each side   3) stand on one foot, start with 5 seconds each side and work up to 20 seconds on each side  If you need ideas or help with getting more active:  -Silver sneakers https://tools.silversneakers.com  -Walk with a Doc: http://www.duncan-williams.com/  -try to include resistance (weight lifting/strength building) and balance exercises twice per week: or the following link for ideas: http://castillo-powell.com/  BuyDucts.dk  STRESS MANAGEMENT: -can try meditating, or just sitting quietly with deep  breathing while intentionally relaxing all parts of your body for 5 minutes daily -if you need further help with stress, anxiety or depression please follow up with your primary doctor or contact the wonderful folks at [facility]: [phone]  SOCIAL CONNECTIONS: -options in [place] if you wish to engage in more social and exercise related activities:  -Silver sneakers https://tools.silversneakers.com  -Walk with a Doc: http://www.duncan-williams.com/  -Check out the [facility] Active Adults 50+ section on the [employer] (hiking clubs, book clubs, cards and games, chess, exercise classes, aquatic classes and much more) - see the website for details: https://www.[place].gov/departments/parks-recreation/active-adults50  -YouTube has lots of exercise videos for different ages and abilities as well  -[facility] (a variety of indoor and outdoor inperson activities for adults). 216-873-0509. [address]  -Virtual Online Classes (a variety of topics): see seniorplanet.org or call [phone]  -consider volunteering at a school, hospice center, church, senior center or elsewhere   ADVANCED HEALTHCARE DIRECTIVES:  [place] Advanced Directives assistance:   ExpressWeek.com.cy  Everyone should have advanced health care directives in place. This is so that you get the care you want, should you ever be in a situation where you are unable to make your own medical decisions.   From the  Advanced Directive Website: Advance Health Care Directives are legal documents in which you give written instructions about your health care if, in the future, you cannot speak for yourself.   A health care power of attorney allows you to name a person you trust to make your health care decisions if you cannot make them yourself. A declaration of a desire for a natural death (or living will) is  document, which states that you desire not to have your life prolonged by extraordinary measures if you have a terminal or incurable illness or if you are in a vegetative state. An advance instruction for mental health treatment makes a declaration of instructions, information and preferences regarding your mental health treatment. It also states that you are aware that the advance instruction authorizes a mental health treatment provider to act according to your wishes. It may also outline your consent or refusal of mental health treatment. A declaration of an anatomical gift allows anyone over the age of 58 to make a gift by will, organ donor card or other document.   Please see the following website or an elder law attorney for forms, FAQs and for completion of advanced directives: Materials [employer]  Advance Health Care Directives Advance Health Care Directives (http://guzman.com/)  Or copy and paste the following to your web browser: PoshChat.[email or web address]           Chiquita JONELLE Cramp, DO

## 2024-03-27 NOTE — Patient Instructions (Addendum)
 I really enjoyed getting to talk with you today! I am available on Tuesdays and Thursdays for virtual visits if you have any questions or concerns, or if I can be of any further assistance.   CHECKLIST FROM ANNUAL WELLNESS VISIT:  -Follow up (please call to schedule if not scheduled after visit):   -yearly for annual wellness visit with primary care office  Here is a list of your preventive care/health maintenance measures and the plan for each if any are due:  PLAN For any measures below that may be due:    1. Please schedule Eye exam with eye doctor.   2. Can get vaccines at the pharmacy if you wish - please provide proof of receipt if you do so that we can update your record.   Health Maintenance  Topic Date Due   OPHTHALMOLOGY EXAM  06/14/2023   COVID-19 Vaccine (8 - Pfizer risk 2024-25 season) 02/18/2024   INFLUENZA VACCINE  03/07/2024   Zoster Vaccines- Shingrix  (2 of 2) 05/15/2024 (Originally 02/13/2024)   DTaP/Tdap/Td (6 - Td or Tdap) 05/07/2024   HEMOGLOBIN A1C  06/07/2024   Diabetic kidney evaluation - Urine ACR  07/11/2024   Diabetic kidney evaluation - eGFR measurement  12/05/2024   FOOT EXAM  01/09/2025   Medicare Annual Wellness (AWV)  03/27/2025   Colonoscopy  02/22/2028   Pneumococcal Vaccine: 50+ Years  Completed   Hepatitis C Screening  Completed   HPV VACCINES  Aged Out   Meningococcal B Vaccine  Aged Out   Hepatitis B Vaccines 19-59 Average Risk  Discontinued    -See a dentist at least yearly  -Get your eyes checked and then per your eye specialist's recommendations  -Other issues addressed today:   -I have included below further information regarding a healthy whole foods based diet, physical activity guidelines for adults, stress management and opportunities for social connections. I hope you find this information useful.    -----------------------------------------------------------------------------------------------------------------------------------------------------------------------------------------------------------------------------------------------------------    NUTRITION: -eat real food: lots of colorful vegetables (half the plate) and fruits -5-7 servings of vegetables and fruits per day (fresh or steamed is best), exp. 2 servings of vegetables with lunch and dinner and 2 servings of fruit per day. Berries and greens such as kale and collards are great choices.  -consume on a regular basis:  fresh fruits, fresh veggies, fish, nuts, seeds, healthy oils (such as olive oil, avocado oil), whole grains (make sure for bread/pasta/crackers/etc., that the first ingredient on label contains the word whole), legumes. -can eat small amounts of dairy and lean meat (no larger than the palm of your hand), but avoid processed meats such as ham, bacon, lunch meat, etc. -drink water -try to avoid fast food and pre-packaged foods, processed meat, ultra processed foods/beverages (donuts, candy, etc.) -most experts advise limiting sodium to < 2300mg  per day, should limit further is any chronic conditions such as high blood pressure, heart disease, diabetes, etc. The American Heart Association advised that < 1500mg  is is ideal -try to avoid foods/beverages that contain any ingredients with names you do not recognize  -try to avoid foods/beverages  with added sugar or sweeteners/sweets  -try to avoid sweet drinks (including diet drinks): soda, juice, Gatorade, sweet tea, power drinks, diet drinks -try to avoid white rice, white bread, pasta (unless whole grain)  EXERCISE GUIDELINES FOR ADULTS: -if you wish to increase your physical activity, do so gradually and with the approval of your doctor -STOP and seek medical care immediately if you have any chest  pain, chest discomfort or trouble breathing when starting or  increasing exercise  -move and stretch your body, legs, feet and arms when sitting for long periods -Physical activity guidelines for optimal health in adults: -get at least 150 minutes per week of moderate exercise (can talk, but not sing); this is about 20-30 minutes of sustained activity 5-7 days per week or two 10-15 minute episodes of sustained activity 5-7 days per week -do some muscle building/resistance training/strength training at least 2 days per week  -balance exercises 3+ days per week:   Stand somewhere where you have something sturdy to hold onto if you lose balance    1) lift up on toes, then back down, start with 5x per day and work up to 20x   2) stand and lift one leg straight out to the side so that foot is a few inches of the floor, start with 5x each side and work up to 20x each side   3) stand on one foot, start with 5 seconds each side and work up to 20 seconds on each side  If you need ideas or help with getting more active:  -Silver sneakers https://tools.silversneakers.com  -Walk with a Doc: http://www.duncan-williams.com/  -try to include resistance (weight lifting/strength building) and balance exercises twice per week: or the following link for ideas: http://castillo-powell.com/  BuyDucts.dk  STRESS MANAGEMENT: -can try meditating, or just sitting quietly with deep breathing while intentionally relaxing all parts of your body for 5 minutes daily -if you need further help with stress, anxiety or depression please follow up with your primary doctor or contact the wonderful folks at [facility]: [phone]  SOCIAL CONNECTIONS: -options in [place] if you wish to engage in more social and exercise related activities:  -Silver sneakers https://tools.silversneakers.com  -Walk with a Doc: http://www.duncan-williams.com/  -Check out the [facility] Active Adults 50+  section on the [place] [employer] (hiking clubs, book clubs, cards and games, chess, exercise classes, aquatic classes and much more) - see the website for details: https://www.[place].gov/departments/parks-recreation/active-adults50  -YouTube has lots of exercise videos for different ages and abilities as well  -[facility] (a variety of indoor and outdoor inperson activities for adults). [phone]. [address].  -Virtual Online Classes (a variety of topics): see seniorplanet.org or call [phone]  -consider volunteering at a school, hospice center, church, senior center or elsewhere   ADVANCED HEALTHCARE DIRECTIVES:  [place] Advanced Directives assistance:   ExpressWeek.com.cy  Everyone should have advanced health care directives in place. This is so that you get the care you want, should you ever be in a situation where you are unable to make your own medical decisions.   From the [place] Advanced Directive Website: Advance Health Care Directives are legal documents in which you give written instructions about your health care if, in the future, you cannot speak for yourself.   A health care power of attorney allows you to name a person you trust to make your health care decisions if you cannot make them yourself. A declaration of a desire for a natural death (or living will) is document, which states that you desire not to have your life prolonged by extraordinary measures if you have a terminal or incurable illness or if you are in a vegetative state. An advance instruction for mental health treatment makes a declaration of instructions, information and preferences regarding your mental health treatment. It also states that you are aware that the advance instruction authorizes a mental health treatment  provider to act according to your wishes. It may also outline your consent or refusal of mental  health treatment. A declaration of an anatomical gift allows anyone over the age of 34 to make a gift by will, organ donor card or other document.   Please see the following website or an elder law attorney for forms, FAQs and for completion of advanced directives: [place]  Print production planner Health Care Directives Advance Health Care Directives (http://guzman.[email or web address]/)  Or copy and paste the following to your web browser: [email or web address]

## 2024-03-27 NOTE — Progress Notes (Signed)
 Patient unable to obtain vital signs due to telehealth visit

## 2024-03-31 DIAGNOSIS — G4734 Idiopathic sleep related nonobstructive alveolar hypoventilation: Secondary | ICD-10-CM | POA: Diagnosis not present

## 2024-04-01 DIAGNOSIS — C61 Malignant neoplasm of prostate: Secondary | ICD-10-CM | POA: Diagnosis not present

## 2024-04-01 DIAGNOSIS — R350 Frequency of micturition: Secondary | ICD-10-CM | POA: Diagnosis not present

## 2024-04-08 DIAGNOSIS — C61 Malignant neoplasm of prostate: Secondary | ICD-10-CM | POA: Diagnosis not present

## 2024-04-08 DIAGNOSIS — R350 Frequency of micturition: Secondary | ICD-10-CM | POA: Diagnosis not present

## 2024-04-10 ENCOUNTER — Other Ambulatory Visit: Payer: Self-pay | Admitting: Internal Medicine

## 2024-04-10 DIAGNOSIS — E1165 Type 2 diabetes mellitus with hyperglycemia: Secondary | ICD-10-CM

## 2024-05-01 DIAGNOSIS — G4734 Idiopathic sleep related nonobstructive alveolar hypoventilation: Secondary | ICD-10-CM | POA: Diagnosis not present

## 2024-05-09 ENCOUNTER — Other Ambulatory Visit (HOSPITAL_BASED_OUTPATIENT_CLINIC_OR_DEPARTMENT_OTHER): Payer: Self-pay

## 2024-05-12 ENCOUNTER — Other Ambulatory Visit (HOSPITAL_BASED_OUTPATIENT_CLINIC_OR_DEPARTMENT_OTHER): Payer: Self-pay

## 2024-05-13 ENCOUNTER — Ambulatory Visit: Admitting: Internal Medicine

## 2024-05-13 ENCOUNTER — Other Ambulatory Visit

## 2024-05-13 ENCOUNTER — Encounter: Payer: Self-pay | Admitting: Internal Medicine

## 2024-05-13 VITALS — BP 120/60 | HR 65 | Ht 70.0 in | Wt 209.2 lb

## 2024-05-13 DIAGNOSIS — E042 Nontoxic multinodular goiter: Secondary | ICD-10-CM

## 2024-05-13 DIAGNOSIS — E782 Mixed hyperlipidemia: Secondary | ICD-10-CM | POA: Diagnosis not present

## 2024-05-13 DIAGNOSIS — Z794 Long term (current) use of insulin: Secondary | ICD-10-CM

## 2024-05-13 DIAGNOSIS — Z7985 Long-term (current) use of injectable non-insulin antidiabetic drugs: Secondary | ICD-10-CM

## 2024-05-13 DIAGNOSIS — Z7984 Long term (current) use of oral hypoglycemic drugs: Secondary | ICD-10-CM | POA: Diagnosis not present

## 2024-05-13 DIAGNOSIS — R200 Anesthesia of skin: Secondary | ICD-10-CM

## 2024-05-13 DIAGNOSIS — E1165 Type 2 diabetes mellitus with hyperglycemia: Secondary | ICD-10-CM

## 2024-05-13 LAB — MICROALBUMIN / CREATININE URINE RATIO
Creatinine, Urine: 73 mg/dL (ref 20–320)
Microalb Creat Ratio: 8 mg/g{creat} (ref <30)
Microalb, Ur: 0.6 mg/dL

## 2024-05-13 LAB — POCT GLYCOSYLATED HEMOGLOBIN (HGB A1C): Hemoglobin A1C: 6.9 % — AB (ref 4.0–5.6)

## 2024-05-13 NOTE — Addendum Note (Signed)
 Addended by: CLEOTILDE ROLIN RAMAN on: 05/13/2024 02:34 PM   Modules accepted: Orders

## 2024-05-13 NOTE — Patient Instructions (Addendum)
 Please use:  - Metformin  2000 mg with dinner - Glipizide  5 mg before dinner If you have a particularly small dinner >> do not take the Glipizide . If you have a particularly large dinner >> double the Glipizide . - Jardiance  25 mg before b'fast - Ozempic  2 mg weekly  Please increase: - Lantus  24 units at bedtime  Check some sugars in the evening.  Please return in 4 months with your sugar log.

## 2024-05-13 NOTE — Progress Notes (Signed)
 Patient ID: Robert Cordova, male   DOB: 1951/05/10, 73 y.o.   MRN: [id number]   HPI: Robert Cordova is a 73 y.o.-year-old male, returning for f/u for DM2, dx in 2013, insulin -dependent since 04/2020, uncontrolled, with complications (h/o DKA). Last visit 6 months ago.  Interim history: He has increased urination (he previously saw urology), no blurry vision, nausea, chest pain. He retired since last OV. He is more active in the yard. His wife had back surgery few months ago and he has been helping her, also.  Their grandson moved to their home so they are taking care of him full-time now.  Reviewed HbA1c levels: Lab Results  Component Value Date   HGBA1C 7.0 (H) 12/06/2023   HGBA1C 6.6 (A) 07/12/2023   HGBA1C 6.6 (A) 01/25/2023  08/22/2016: 7.7%  He is on: - Metformin  1000 mg 2x a day, with meals >> 2000 mg with dinner - Glipizide  5-10 mg 30 minutes before dinner >> 5 mg before dinner If you have a particularly small dinner >> do not take the Glipizide . If you have a particularly large dinner >> double the Glipizide . - Jardiance  25 mg before b'fast - Ozempic  0.5 mg weekly-added 09/2018 >> 1 >> 2 mg weekly - Lantus  -started 04/2020 >> ... 20-25 >> 20 units at bedtime Of note, He was off and on insulin  in the past. He came off when he lost weight after his lab band surgery in 2014 (45 pounds)  He is checking sugars 1-2 times a day: - am: 84-132 >> 78-141, 154 >> 87-163, 222 >> 115, 123-168, 180 - 2h after b'fast: 181, 215 >> n/c >> 100 >> 119 >> n/c - before lunch: 99-122 >> 148 >> n/c >> 148 >> n/c - 2h after lunch: n/c >> 118-215 >> n/c >> 121 >> n/c - before dinner: 108-134 >> 94, 138, 150 >> n/c >> 130 >> n/c - 2h after dinner: n/c >> 208 >> n/c >> 142 >> 143-223, 293 >> n/c - bedtime:  78-187, 200 >> 67, 68-216 >> 60, 94-207 >> n/c - nighttime: n/c >> 212 >> n/c >> 155 >> 102 >> 170 Lowest sugar was 60 >> 87 >> 115; he has hypoglycemia awareness in the 70s. Highest  sugar was 207 >> 293 >> 180.  Pt's meals are: - Breakfast: Protein shake + coffee and cream - Lunch (12-1): Eggs, bacon or leftovers - Dinner: Meat + veggies + starch, pizza, [place] food - Snacks: In the evening: cookies, etc. I snack until I go to bed, craves sweets  -No CKD, last BUN/creatinine:  Lab Results  Component Value Date   BUN 16 12/06/2023   BUN 15 07/12/2023   CREATININE 0.65 12/06/2023   CREATININE 0.73 07/12/2023   Lab Results  Component Value Date   MICRALBCREAT NOTE 07/12/2023   MICRALBCREAT 0.6 04/18/2010  On losartan  25 mg twice a day.  -+ HL; last set of lipids: Lab Results  Component Value Date   CHOL 113 12/06/2023   HDL 47.10 12/06/2023   LDLCALC 47 12/06/2023   LDLDIRECT 72.0 05/07/2018   TRIG 97.0 12/06/2023   CHOLHDL 2 12/06/2023  On Lipitor 20, co-Q10, Krill oil  - last eye exam was in 2024 reportedly No DR, + incipient cataract. [facility].  She sees Dr. Glendia and Dr. Patrcia.  -+ Numbness in  toes - persistent.  Last foot exam 01/10/2024.  B12 was elevated in 01/2020-we increased his supplement dose from 5000 to 2500 mcg daily.  Afterwards, we  reduced the dose to  1000 mcg qod. Stopped B complex.  Reviewed his B12 level: Lab Results  Component Value Date   VITAMINB12 1,134 (H) 07/12/2023   VITAMINB12 469 09/04/2022   VITAMINB12 824 08/12/2021   VITAMINB12 599 11/23/2020   VITAMINB12 >1526 (H) 01/06/2020   Thyroid  nodules: - s/p 2 benign biopsies in 2014  Pt denies: - feeling nodules in neck - hoarseness - dysphagia - choking  Reviewed thyroid  imaging results and biopsy results: Thyroid  U/S (10/20/2016): Stable 2.8 cm solid nodule in the inferior right lobe.  This was previously sampled with negative biopsy.  The 1.8 cm nodule previously sampled in the right isthmus was no longer identified by ultrasound.  Thyroid  U/S (01/15/2020): COMPARISON:  10/19/2016  FINDINGS: Parenchymal Echotexture: Mildly  heterogenous Isthmus: 0.9 cm Right lobe: 6.1 x 2.6 x 1.9 cm. Left lobe: 5.4 x 2.3 x 1.3 cm _________________________________________________________  Nodule # 2: Prior biopsy: Yes Location: Right; Inferior Maximum size: 3.7 cm; Other 2 dimensions: 3.1 x 2.8 cm, previously, 2.8 x 2.6 x 2.1 cm Composition: solid/almost completely solid (2) Echogenicity: isoechoic (1) Significant change in size (>/= 20% in two dimensions and minimal increase of 2 mm): Yes  **Given size (>/= 2.5 cm) and appearance, fine needle aspiration of this mildly suspicious nodule should be considered based on TI-RADS criteria. _______________________________________________________  There is a stable small hypoechoic nodule measuring approximately 7 mm in the right mid thyroid  gland.  IMPRESSION: Interval increase in size of the dominant right-sided thyroid  nodule, currently measuring 3.7 cm (previously measuring 2.8 cm). While this thyroid  nodule was previously biopsied, consider repeat ultrasound or fine-needle aspiration given the nodule's significant interval growth.  Thyroid  FNA (01/20/2020): FINAL MICROSCOPIC DIAGNOSIS:  - Consistent with benign follicular nodule (Bethesda category II)   SPECIMEN ADEQUACY:  Satisfactory for evaluation   Thyroid  U/S (12/16/2021): Parenchymal Echotexture: Moderately heterogenous  Isthmus: 2.6 cm  Right lobe: 6.1 cm x 2.1 cm x 2.1 cm  Left lobe: 5.0 cm x 2.2 cm x 1.9 cm  _________________________________________________   Nodule labeled 1 superior right thyroid  8 mm, with TR 2/cystic characteristics and does not meet criteria for surveillance.   Nodule labeled 2, lower right thyroid , 4.2 cm. Nodule has been previously biopsied. Assuming benign result, no further specific follow-up would be indicated.   Nodule labeled 3 inferior right thyroid , 1.1 cm with spongiform characteristics. Nodule does not meet criteria for further surveillance.   Nodule labeled 4,  lower left thyroid , 1.4 cm. Nodule has TR 3 characteristics and does not meet criteria for surveillance.   Nodule labeled 5, lower left thyroid , 7 mm with spongiform characteristics and does not meet criteria for surveillance.   Nodule labeled 6, inferior left thyroid , 1.4 cm with TR 3 characteristics. Nodule does not meet criteria for surveillance.   No adenopathy.   Recommendations follow those established by the new ACR TI-RADS criteria (J Am Coll Radiol 2017;14:587-595).   IMPRESSION: Redemonstration of heterogeneous/multinodular thyroid  which may indicate medical thyroid  disease.   Assuming a benign result of the prior biopsy of nodule labeled 2, no further specific follow-up would be indicated.  Latest TSH was normal: Lab Results  Component Value Date   TSH 0.63 12/06/2023   He also has sleep-related hypoxia, HTN.  Also, prostate cancer-followed expectantly. He has a history of gastric banding (11/25/2013).  Weight before surgery: 259 pounds. He has Medicare with 2 supplemental insurances.  ROS: + see HPI  I reviewed pt's medications, allergies, PMH, social hx, family  hx, and changes were documented in the history of present illness. Otherwise, unchanged from my initial visit note.  Past Medical History:  Diagnosis Date   ABNORMAL GLUCOSE NEC 03/18/2007   Allergy 2005   Ace inhibitors--coughing   Anxiety    Arthritis 2025   fingers   BLEPHARITIS, LEFT 10/29/2009   BPH (benign prostatic hyperplasia)    Cancer (HCC) 2024   Prostate 10/2022   Cataract 2020   both removed   COPD (chronic obstructive pulmonary disease) (HCC) 2016   Use oxygen  at night   DIABETES-TYPE 2 04/18/2010   Dry throat    Environmental allergies    GERD (gastroesophageal reflux disease) 2010   H/O hiatal hernia    HYPERLIPIDEMIA 10/22/2007   HYPERTENSION 03/18/2007   Multiple thyroid  nodules    biopsy done, everything was fine being monitored   Neuromuscular disorder (HCC) 2024    Toes on left fiot   NEVUS, MELANOCYTIC, FACE 10/07/2009   Osteoarthritis of both thumbs    Oxygen  deficiency 2016   Uses O2 concentrator at night  shallow breather   Past Surgical History:  Procedure Laterality Date   BIOPSY THYROID   ~08/2013   BREATH TEK H PYLORI N/A 09/05/2013   Procedure: BREATH TEK H PYLORI;  Surgeon: Donnice KATHEE Lunger, MD;  Location: [facility];  Service: General;  Laterality: N/A;   CATARACT EXTRACTION Bilateral    COLONOSCOPY  2009, 2013   Dr. Jakie; multiple polyps   COLONOSCOPY WITH PROPOFOL  N/A 02/22/2023   Procedure: COLONOSCOPY WITH PROPOFOL ;  Surgeon: Leigh Elspeth SQUIBB, MD;  Location: [facility];  Service: Gastroenterology;  Laterality: N/A;  on Oxygen  at night   EYE SURGERY  2020   Cataracts   HIATAL HERNIA REPAIR  11/25/2013   Procedure: LAPAROSCOPIC REPAIR OF HIATAL HERNIA;  Surgeon: Donnice KATHEE Lunger, MD;  Location: [facility];  Service: General;;   LAPAROSCOPIC GASTRIC BANDING N/A 11/25/2013   Procedure: LAPAROSCOPIC GASTRIC BANDING;  Surgeon: Donnice KATHEE Lunger, MD;  Location: [facility];  Service: General;  Laterality: N/A;   POLYPECTOMY  02/22/2023   Procedure: POLYPECTOMY;  Surgeon: Leigh Elspeth SQUIBB, MD;  Location: [facility];  Service: Gastroenterology;;   Social History   Social History   Marital status: Married    Spouse name: N/A   Number of children: 2   Occupational History   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 1 kit 0    Cholecalciferol (VITAMIN D ) 50 MCG (2000 UT) CAPS Take 2,000 Units by mouth daily.     Coenzyme Q10 (CO Q-10) 200 MG CAPS Take 200 mg by mouth daily.     cyanocobalamin  (VITAMIN B12) 1000 MCG tablet Take 1,000 mcg by mouth daily.     glipiZIDE  (GLUCOTROL ) 5 MG tablet TAKE Glipizide  5 mg before dinner. If you have a particularly small dinner do not take the Glipizide . 180 tablet 1   insulin  glargine (LANTUS  SOLOSTAR) 100 UNIT/ML Solostar Pen INJECT 20-24 UNITS  INTO THE SKIN AT BEDTIME. 15 mL 5   Insulin  Pen Needle (DROPLET PEN NEEDLES) 32G X 4 MM MISC USE TO INJECT ONE TIME DAILY 100 each 3   JARDIANCE  25 MG TABS tablet TAKE 1 TABLET (25 MG TOTAL) BY MOUTH DAILY. 90 tablet 3   lidocaine  4 % Place 1 patch onto the skin daily as needed (pain).     losartan  (COZAAR ) 50 MG tablet TAKE 1 TABLET BY MOUTH DAILY 90 tablet 3   Magnesium 250 MG TABS Take 250 mg by mouth daily.     metFORMIN  (GLUCOPHAGE ) 500 MG tablet TAKE 2 TABLETS BY MOUTH TWICE DAILY WITH MEALS 360 tablet 3   minoxidil  (ROGAINE ) 2 % external solution Apply 1 application topically daily. 60 mL 10   Misc Natural Products (JOINT HEALTH PO) Take 1 tablet by mouth 2 (two) times daily. Osteo Bi-Flex     MYRBETRIQ 50 MG TB24 tablet Take 50 mg by mouth daily.     OXYGEN  Inhale 2-2.5 L into the lungs at bedtime.     Semaglutide , 2 MG/DOSE, (OZEMPIC , 2 MG/DOSE,) 8 MG/3ML SOPN INJECT 2 MG AS DIRECTED ONCE A WEEK. 9 mL 3   tamsulosin (FLOMAX) 0.4 MG CAPS capsule Take 0.4 mg by mouth at bedtime.     TRUE METRIX BLOOD GLUCOSE TEST test strip TEST BLOOD SUGAR TWICE DAILY 200 strip 0   TRUEplus Lancets 33G MISC Use as instructed two times daily 300 each 2   No current facility-administered medications on file prior to visit.   Allergies  Allergen Reactions   Ace Inhibitors Cough   Family History  Problem Relation Age of Onset   Hyperlipidemia Mother    Hypertension Mother    Arthritis Mother    Cancer Mother    Heart disease Mother    COPD  Mother    Stroke Mother    Hyperlipidemia Father    Hypertension Father    Heart attack Father        Extenuating circumstances   Heart disease Father    Hyperlipidemia Brother    Diabetes Maternal Grandmother    Diabetes Maternal Grandfather    Diabetes Paternal Grandmother    Colon cancer Neg Hx    Stomach cancer Neg Hx    Esophageal cancer Neg Hx    Rectal cancer Neg Hx    Colon polyps Neg Hx    PE: BP 120/60   Pulse 65   Ht 5' 10 (1.778 m)   Wt 209 lb 3.2 oz (94.9 kg)   SpO2 97%   BMI 30.02 kg/m  Wt Readings from Last 3 Encounters:  05/13/24 209 lb 3.2 oz (94.9 kg)  03/27/24 210 lb (95.3 kg)  02/13/24 210 lb 3.2 oz (95.3 kg)   Constitutional: overweight, in NAD Eyes: EOMI, no exophthalmos ENT: no thyromegaly but R inf thyroid  nodule palpated, measuring approximately 2.5 cm, no cervical lymphadenopathy Cardiovascular: RRR, No MRG Respiratory: CTA B Musculoskeletal: no deformities Skin: no rashes Neurological: + mild tremor with outstretched hands  ASSESSMENT: 1. DM2, insulin -dependent, uncontrolled, without long term complications, but with hyperglycemia - h/o DKA  2.  Multinodular goiter  3. HL  4.  Numbness in toes/high B12 level  PLAN:  1. Patient with longstanding, uncontrolled, type 2 diabetes, on oral antidiabetic regimen with metformin , sulfonylurea, SGLT2 inhibitor, long-acting insulin  and weekly GLP-1 R agonist, with a higher HbA1c at last visit, at 7.0%, increased from 6.6%.  At that time, he returned after.  When he relaxed his  diet.  Sugars were improved in the morning, mostly at goal but with occasional hyperglycemic spikes.  He did mention that he was sometimes forgetting his oral medications.  His post dinner blood sugars were quite high, to 290s.  We discussed about putting comment in his log about why the reason could be high and to use a higher dose of glipizide  before larger meals. -He is not checking sugars in the evening, as he is going to  bed soon after he eats dinner.  We did discuss about checking some sugars in the second half of the day.  The sugars in the morning are slightly higher than before and mostly above target.  I did advise him to increase his Lantus  dose slightly, but would not necessarily recommend other changes especially since his HbA1c is slightly better at today's visit (see below). - I suggested to:  Patient Instructions  Please use:  - Metformin  2000 mg with dinner - Glipizide  5 mg before dinner - Jardiance  25 mg before b'fast - Lantus  20 units at bedtime - Ozempic  2 mg weekly  If you have a particularly small dinner >> do not take the Glipizide . If you have a particularly large dinner >> double the Glipizide .  Please return in 4 months with your sugar log.  - At today's visit, HbA1c 6.9% (slightly lower) - advised to check sugars at different times of the day - 1x a day, rotating check times - advised for yearly eye exams >> he has an appointment coming up in 2 days - will check an ACR today - return to clinic in 4 months  2. Thyroid  nodules and goiter -Nodules appears to be stable in size on the ultrasound from 20 but in 01/2020, new ultrasound showed an increase in the right inferior dominant nodule size, 2.8 to 3.7 cm.  The biopsy of this nodule was benign.  The latest neck ultrasound from 12/2021 showed that the nodules appeared to be a low risk, and not worrisome.  No imaging follow-up was needed, but we are following him clinically. - Latest TSH was normal: Lab Results  Component Value Date   TSH 0.63 12/06/2023  - No neck compression symptoms  3. HL - Latest lipid panel showed fractions at goal: Lab Results  Component Value Date   CHOL 113 12/06/2023   HDL 47.10 12/06/2023   LDLCALC 47 12/06/2023   LDLDIRECT 72.0 05/07/2018   TRIG 97.0 12/06/2023   CHOLHDL 2 12/06/2023  - He continues on Lipitor 20 mg daily, Krill oil, co-Q10, without side effects  4.  Elevated B12 -he had  numbness and tingling in his hands and a B12 was found to be low -We started supplementation initially with 5000 mcg daily but we decreased the dose to 1000 mcg every other day since then - Latest B12 level was only slightly elevated: Lab Results  Component Value Date   VITAMINB12 1,134 (H) 07/12/2023   Lela Fendt, MD PhD [facility]

## 2024-05-14 ENCOUNTER — Ambulatory Visit: Payer: Self-pay | Admitting: Internal Medicine

## 2024-05-15 LAB — OPHTHALMOLOGY REPORT-SCANNED

## 2024-05-29 ENCOUNTER — Encounter: Payer: Self-pay | Admitting: Pulmonary Disease

## 2024-05-29 ENCOUNTER — Ambulatory Visit: Admitting: Pulmonary Disease

## 2024-05-29 VITALS — BP 146/68 | HR 66 | Ht 70.0 in | Wt 213.0 lb

## 2024-05-29 DIAGNOSIS — G4734 Idiopathic sleep related nonobstructive alveolar hypoventilation: Secondary | ICD-10-CM

## 2024-05-29 DIAGNOSIS — R0602 Shortness of breath: Secondary | ICD-10-CM

## 2024-05-29 NOTE — Patient Instructions (Addendum)
 Continue night time oxygen   Schedule pulmonary function tests at the front desk   Follow up in 6 months

## 2024-05-29 NOTE — Progress Notes (Unsigned)
 Synopsis: Referred in July 2023 for Nocturnal Hypoxemia by Clotilda Single, MD  Subjective:   PATIENT ID: Robert Cordova GENDER: male DOB: [birth year], MRN: [id number]  HPI  Chief Complaint  Patient presents with   Medical Management of Chronic Issues   Robert Cordova is a 73 year old male, never smoker with DMII, GERD, hyperlipidemia, hypertension and sleep related hypoxemia.   He continues to use oxygen  at night with sleep.   He experiences shortness of breath during activities such as walking up stairs but not during activities like mowing the lawn. No cough, wheezing, or sinus issues, but he has a drippy nose.   Past Medical History:  Diagnosis Date   ABNORMAL GLUCOSE NEC 03/18/2007   Allergy 2005   Ace inhibitors--coughing   Anxiety    Arthritis 2025   fingers   BLEPHARITIS, LEFT 10/29/2009   BPH (benign prostatic hyperplasia)    Cancer (HCC) 2024   Prostate 10/2022   Cataract 2020   both removed   COPD (chronic obstructive pulmonary disease) (HCC) 2016   Use oxygen  at night   DIABETES-TYPE 2 04/18/2010   Dry throat    Environmental allergies    GERD (gastroesophageal reflux disease) 2010   H/O hiatal hernia    HYPERLIPIDEMIA 10/22/2007   HYPERTENSION 03/18/2007   Multiple thyroid  nodules    biopsy done, everything was fine being monitored   Neuromuscular disorder (HCC) 2024   Toes on left fiot   NEVUS, MELANOCYTIC, FACE 10/07/2009   Osteoarthritis of both thumbs    Oxygen  deficiency 2016   Uses O2 concentrator at night  shallow breather     Family History  Problem Relation Age of Onset   Hyperlipidemia Mother    Hypertension Mother    Arthritis Mother    Cancer Mother    Heart disease Mother    COPD Mother    Stroke Mother    Hyperlipidemia Father    Hypertension Father    Heart attack Father        Extenuating circumstances   Heart disease Father    Hyperlipidemia Brother    Diabetes Maternal Grandmother    Diabetes Maternal  Grandfather    Diabetes Paternal Grandmother    Colon cancer Neg Hx    Stomach cancer Neg Hx    Esophageal cancer Neg Hx    Rectal cancer Neg Hx    Colon polyps Neg Hx      Social History   Socioeconomic History   Marital status: Married    Spouse name: Architectural technologist   Number of children: 2   Years of education: 16   Highest education level: Master's degree (e.g., MA, MS, MEng, MEd, MSW, MBA)  Occupational History   Occupation: Research scientist (medical)-- self-employeed/[employer]   Occupation: retired  Tobacco Use   Smoking status: Never    Passive exposure: Past   Smokeless tobacco: Never  Vaping Use   Vaping status: Never Used  Substance and Sexual Activity   Alcohol use: No   Drug use: No   Sexual activity: Yes    Partners: Female  Other Topics Concern   Not on file  Social History Narrative   Not on file   Social Drivers of Health   Financial Resource Strain: Low Risk  (03/27/2024)   Overall Financial Resource Strain (CARDIA)    Difficulty of Paying Living Expenses: Not hard at all  Food Insecurity: No Food Insecurity (03/27/2024)   Hunger Vital Sign    Worried About Running Out of Food  in the Last Year: Never true    Ran Out of Food in the Last Year: Never true  Transportation Needs: No Transportation Needs (03/27/2024)   PRAPARE - Administrator, Civil Service (Medical): No    Lack of Transportation (Non-Medical): No  Physical Activity: Sufficiently Active (03/27/2024)   Exercise Vital Sign    Days of Exercise per Week: 1 day    Minutes of Exercise per Session: 150+ min  Stress: Stress Concern Present (03/27/2024)   [employer] of Occupational Health - Occupational Stress Questionnaire    Feeling of Stress: To some extent  Social Connections: Moderately Isolated (03/27/2024)   Social Connection and Isolation Panel    Frequency of Communication with Friends and Family: More than three times a week    Frequency of Social Gatherings with Friends and  Family: More than three times a week    Attends Religious Services: Never    Database administrator or Organizations: No    Attends Engineer, structural: Not on file    Marital Status: Married  Catering manager Violence: Not At Risk (03/27/2024)   Humiliation, Afraid, Rape, and Kick questionnaire    Fear of Current or Ex-Partner: No    Emotionally Abused: No    Physically Abused: No    Sexually Abused: No     Allergies  Allergen Reactions   Ace Inhibitors Cough     Outpatient Medications Prior to Visit  Medication Sig Dispense Refill   atorvastatin  (LIPITOR) 20 MG tablet TAKE 1 TABLET BY MOUTH EVERY DAY 90 tablet 2   Blood Glucose Calibration (TRUE METRIX LEVEL 1) Low SOLN Use as instructed 1 each 0   Blood Glucose Monitoring Suppl (TRUE METRIX AIR GLUCOSE METER) w/Device KIT 1 Device by Other route as directed. 1 kit 0   Cholecalciferol (VITAMIN D ) 50 MCG (2000 UT) CAPS Take 2,000 Units by mouth daily.     Coenzyme Q10 (CO Q-10) 200 MG CAPS Take 200 mg by mouth daily.     cyanocobalamin  (VITAMIN B12) 1000 MCG tablet Take 1,000 mcg by mouth daily.     glipiZIDE  (GLUCOTROL ) 5 MG tablet TAKE Glipizide  5 mg before dinner. If you have a particularly small dinner do not take the Glipizide . 180 tablet 1   insulin  glargine (LANTUS  SOLOSTAR) 100 UNIT/ML Solostar Pen INJECT 20-24 UNITS INTO THE SKIN AT BEDTIME. 15 mL 5   JARDIANCE  25 MG TABS tablet TAKE 1 TABLET (25 MG TOTAL) BY MOUTH DAILY. 90 tablet 3   lidocaine  4 % Place 1 patch onto the skin daily as needed (pain).     losartan  (COZAAR ) 50 MG tablet TAKE 1 TABLET BY MOUTH DAILY 90 tablet 3   Magnesium 250 MG TABS Take 250 mg by mouth daily.     metFORMIN  (GLUCOPHAGE ) 500 MG tablet TAKE 2 TABLETS BY MOUTH TWICE DAILY WITH MEALS 360 tablet 3   minoxidil  (ROGAINE ) 2 % external solution Apply 1 application topically daily. 60 mL 10   Misc Natural Products (JOINT HEALTH PO) Take 1 tablet by mouth 2 (two) times daily. Osteo Bi-Flex      MYRBETRIQ 50 MG TB24 tablet Take 50 mg by mouth daily.     OXYGEN  Inhale 2-2.5 L into the lungs at bedtime.     Semaglutide , 2 MG/DOSE, (OZEMPIC , 2 MG/DOSE,) 8 MG/3ML SOPN INJECT 2 MG AS DIRECTED ONCE A WEEK. 9 mL 3   tamsulosin (FLOMAX) 0.4 MG CAPS capsule Take 0.4 mg by mouth at bedtime.  TRUE METRIX BLOOD GLUCOSE TEST test strip TEST BLOOD SUGAR TWICE DAILY 200 strip 0   TRUEplus Lancets 33G MISC Use as instructed two times daily 300 each 2   No facility-administered medications prior to visit.   Review of Systems  Constitutional:  Negative for chills, fever, malaise/fatigue and weight loss.  HENT:  Negative for congestion, sinus pain and sore throat.   Eyes: Negative.   Respiratory:  Positive for shortness of breath (with exertion). Negative for cough, hemoptysis, sputum production and wheezing.   Cardiovascular:  Negative for chest pain, palpitations, orthopnea, claudication and leg swelling.  Gastrointestinal:  Negative for abdominal pain, heartburn, nausea and vomiting.  Genitourinary: Negative.   Musculoskeletal:  Negative for joint pain and myalgias.  Skin:  Negative for rash.  Neurological:  Negative for weakness.  Endo/Heme/Allergies: Negative.   Psychiatric/Behavioral: Negative.     Objective:   Vitals:   05/29/24 1400  BP: (!) 146/68  Pulse: 66  SpO2: 95%  Weight: 213 lb (96.6 kg)  Height: 5' 10 (1.778 m)     Physical Exam Constitutional:      General: He is not in acute distress. HENT:     Head: Normocephalic and atraumatic.  Eyes:     Conjunctiva/sclera: Conjunctivae normal.  Cardiovascular:     Rate and Rhythm: Normal rate and regular rhythm.     Pulses: Normal pulses.     Heart sounds: Normal heart sounds. No murmur heard. Musculoskeletal:     Right lower leg: No edema.     Left lower leg: No edema.  Neurological:     General: No focal deficit present.     Mental Status: He is alert.    CBC    Component Value Date/Time   WBC 6.7  12/06/2023 1026   RBC 4.58 12/06/2023 1026   HGB 14.4 12/06/2023 1026   HCT 42.7 12/06/2023 1026   PLT 316.0 12/06/2023 1026   MCV 93.3 12/06/2023 1026   MCH 30.6 11/26/2013 0428   MCHC 33.7 12/06/2023 1026   RDW 12.8 12/06/2023 1026   LYMPHSABS 1.6 12/06/2023 1026   MONOABS 0.7 12/06/2023 1026   EOSABS 0.1 12/06/2023 1026   BASOSABS 0.1 12/06/2023 1026      Latest Ref Rng & Units 12/06/2023   10:26 AM 07/12/2023   11:16 AM 09/04/2022   10:51 AM  BMP  Glucose 70 - 99 mg/dL 886  873  91   BUN 6 - 23 mg/dL 16  15  15    Creatinine 0.40 - 1.50 mg/dL 9.34  9.26  9.29   BUN/Creat Ratio 6 - 22 (calc)  SEE NOTE:    Sodium 135 - 145 mEq/L 139  141  139   Potassium 3.5 - 5.1 mEq/L 4.3  4.6  4.6   Chloride 96 - 112 mEq/L 104  105  103   CO2 19 - 32 mEq/L 27  27  26    Calcium  8.4 - 10.5 mg/dL 9.9  9.7  89.8    Chest imaging:  PFT:    Latest Ref Rng & Units 06/17/2015   12:48 PM  PFT Results  FVC-Pre L 4.62   FVC-Predicted Pre % 97   FVC-Post L 4.52   FVC-Predicted Post % 95   Pre FEV1/FVC % % 80   Post FEV1/FCV % % 86   FEV1-Pre L 3.69   FEV1-Predicted Pre % 104   FEV1-Post L 3.89   DLCO uncorrected ml/min/mmHg 29.01   DLCO UNC% % 86   DLVA  Predicted % 96   2016: Within normal limits  Labs:  Path:  Echo 2019: LV EF 55-60%. Atrial septal aneurysm present. Mild aortic regurgitation  Heart Catheterization:     Assessment & Plan:   Nocturnal hypoxemia  Shortness of breath  Discussion: Nasier Thumm is a 73 year old male, never smoker with DMII, GERD, hyperlipidemia, hypertension and sleep related hypoxemia.   Nocturnal Hypoxemia - scheduled PFT - continue supplemental oxygen  at night   Dorn Chill, MD [facility] Office: [phone]   Current Outpatient Medications:    atorvastatin  (LIPITOR) 20 MG tablet, TAKE 1 TABLET BY MOUTH EVERY DAY, Disp: 90 tablet, Rfl: 2   Blood Glucose Calibration (TRUE METRIX LEVEL 1) Low SOLN,  Use as instructed, Disp: 1 each, Rfl: 0   Blood Glucose Monitoring Suppl (TRUE METRIX AIR GLUCOSE METER) w/Device KIT, 1 Device by Other route as directed., Disp: 1 kit, Rfl: 0   Cholecalciferol (VITAMIN D ) 50 MCG (2000 UT) CAPS, Take 2,000 Units by mouth daily., Disp: , Rfl:    Coenzyme Q10 (CO Q-10) 200 MG CAPS, Take 200 mg by mouth daily., Disp: , Rfl:    cyanocobalamin  (VITAMIN B12) 1000 MCG tablet, Take 1,000 mcg by mouth daily., Disp: , Rfl:    glipiZIDE  (GLUCOTROL ) 5 MG tablet, TAKE Glipizide  5 mg before dinner. If you have a particularly small dinner do not take the Glipizide ., Disp: 180 tablet, Rfl: 1   insulin  glargine (LANTUS  SOLOSTAR) 100 UNIT/ML Solostar Pen, INJECT 20-24 UNITS INTO THE SKIN AT BEDTIME., Disp: 15 mL, Rfl: 5   JARDIANCE  25 MG TABS tablet, TAKE 1 TABLET (25 MG TOTAL) BY MOUTH DAILY., Disp: 90 tablet, Rfl: 3   lidocaine  4 %, Place 1 patch onto the skin daily as needed (pain)., Disp: , Rfl:    losartan  (COZAAR ) 50 MG tablet, TAKE 1 TABLET BY MOUTH DAILY, Disp: 90 tablet, Rfl: 3   Magnesium 250 MG TABS, Take 250 mg by mouth daily., Disp: , Rfl:    metFORMIN  (GLUCOPHAGE ) 500 MG tablet, TAKE 2 TABLETS BY MOUTH TWICE DAILY WITH MEALS, Disp: 360 tablet, Rfl: 3   minoxidil  (ROGAINE ) 2 % external solution, Apply 1 application topically daily., Disp: 60 mL, Rfl: 10   Misc Natural Products (JOINT HEALTH PO), Take 1 tablet by mouth 2 (two) times daily. Osteo Bi-Flex, Disp: , Rfl:    MYRBETRIQ 50 MG TB24 tablet, Take 50 mg by mouth daily., Disp: , Rfl:    OXYGEN , Inhale 2-2.5 L into the lungs at bedtime., Disp: , Rfl:    Semaglutide , 2 MG/DOSE, (OZEMPIC , 2 MG/DOSE,) 8 MG/3ML SOPN, INJECT 2 MG AS DIRECTED ONCE A WEEK., Disp: 9 mL, Rfl: 3   tamsulosin (FLOMAX) 0.4 MG CAPS capsule, Take 0.4 mg by mouth at bedtime., Disp: , Rfl:    TRUE METRIX BLOOD GLUCOSE TEST test strip, TEST BLOOD SUGAR TWICE DAILY, Disp: 200 strip, Rfl: 0   TRUEplus Lancets 33G MISC, Use as instructed two times  daily, Disp: 300 each, Rfl: 2

## 2024-05-30 ENCOUNTER — Encounter: Payer: Self-pay | Admitting: Pulmonary Disease

## 2024-05-31 DIAGNOSIS — G4734 Idiopathic sleep related nonobstructive alveolar hypoventilation: Secondary | ICD-10-CM | POA: Diagnosis not present

## 2024-06-25 ENCOUNTER — Telehealth: Payer: Self-pay | Admitting: Cardiovascular Disease

## 2024-06-25 NOTE — Telephone Encounter (Signed)
  Per MyChart scheduling message:  Pt c/o of Chest Pain: STAT if active (IN THIS MOMENT) CP, including tightness, pressure, jaw pain, shoulder/upper arm/back pain, SOB, nausea, and vomiting.  1. Are you having CP right now (tightness, pressure, or discomfort)?   2. Are you experiencing any other symptoms (ex. SOB, nausea, vomiting, sweating)?   3. How long have you been experiencing CP?   4. Is your CP continuous or coming and going?   5. Have you taken Nitroglycerin?   6. If CP returns before callback, please consider calling 911. ?   1. Occasional tightness 2. Shortness of breath occasionally we climbing stairs  3. Noticed the shortness of breath 6-8 months ago after my first episode. It infrequent, but seems to be increasing in regularity  4. Tightness comes and goes-- no real pain 5. I don't take nitro.

## 2024-06-25 NOTE — Telephone Encounter (Signed)
 I spoke with patient.  He reports he has been having shortness of breath for 6-8 months.  Seems to be worsening recently.  Notices when he goes up stairs.  Has seen pulmonary and testing scheduled but he was advised to follow up with cardiology per patient report. Patient reports he has chest tightness on occasion. Does not seem to be related to exertion.  Happens at random times and goes away on it's own.  Lasts a few moments.  Patient is due for follow up with Dr Verlin.  Appointment made for patient to see Dr Verlin on 12/9.  ED precautions reviewed with patient

## 2024-07-14 NOTE — Progress Notes (Unsigned)
 No chief complaint on file.  History of Present Illness: 73 yo male with history of prostate cancer, diabetes type 2, GERD, hiatal hernia, HTN and hyperlipidemia who is here today for follow up. He was seen in our office in July 2024 for management of HTN. He had been seen in 2019 by Dr. Bernie. Echo normal in 2019. Normal stress echo in 2019. At his visit here in 2024 he described fatigue during the day and daytime somnolence. He has had testing for sleep apnea and was told that his oxygen  levels dropped so he has supplemental oxygen  therapy that he uses when sleeping. Echo August 2024 with LVEF=60-65%. Mild AI.   He is here today for follow up. The patient denies any chest pain, dyspnea, palpitations, lower extremity edema, orthopnea, PND, dizziness, near syncope or syncope.   Primary Care Physician: Mercer Clotilda SAUNDERS, MD   Past Medical History:  Diagnosis Date   ABNORMAL GLUCOSE NEC 03/18/2007   Allergy 2005   Ace inhibitors--coughing   Anxiety    Arthritis 2025   fingers   BLEPHARITIS, LEFT 10/29/2009   BPH (benign prostatic hyperplasia)    Cancer (HCC) 2024   Prostate 10/2022   Cataract 2020   both removed   COPD (chronic obstructive pulmonary disease) (HCC) 2016   Use oxygen  at night   DIABETES-TYPE 2 04/18/2010   Dry throat    Environmental allergies    GERD (gastroesophageal reflux disease) 2010   H/O hiatal hernia    HYPERLIPIDEMIA 10/22/2007   HYPERTENSION 03/18/2007   Multiple thyroid  nodules    biopsy done, everything was fine being monitored   Neuromuscular disorder (HCC) 2024   Toes on left fiot   NEVUS, MELANOCYTIC, FACE 10/07/2009   Osteoarthritis of both thumbs    Oxygen  deficiency 2016   Uses O2 concentrator at night  shallow breather    Past Surgical History:  Procedure Laterality Date   BIOPSY THYROID   ~08/2013   BREATH TEK H PYLORI N/A 09/05/2013   Procedure: BREATH TEK H PYLORI;  Surgeon: Donnice KATHEE Lunger, MD;  Location: [facility];   Service: General;  Laterality: N/A;   CATARACT EXTRACTION Bilateral    COLONOSCOPY  2009, 2013   Dr. Jakie; multiple polyps   COLONOSCOPY WITH PROPOFOL  N/A 02/22/2023   Procedure: COLONOSCOPY WITH PROPOFOL ;  Surgeon: Leigh Elspeth SQUIBB, MD;  Location: [facility];  Service: Gastroenterology;  Laterality: N/A;  on Oxygen  at night   EYE SURGERY  2020   Cataracts   HIATAL HERNIA REPAIR  11/25/2013   Procedure: LAPAROSCOPIC REPAIR OF HIATAL HERNIA;  Surgeon: Donnice KATHEE Lunger, MD;  Location: [facility];  Service: General;;   LAPAROSCOPIC GASTRIC BANDING N/A 11/25/2013   Procedure: LAPAROSCOPIC GASTRIC BANDING;  Surgeon: Donnice KATHEE Lunger, MD;  Location: [facility];  Service: General;  Laterality: N/A;   POLYPECTOMY  02/22/2023   Procedure: POLYPECTOMY;  Surgeon: Leigh Elspeth SQUIBB, MD;  Location: [facility];  Service: Gastroenterology;;    Current Outpatient Medications  Medication Sig Dispense Refill   atorvastatin  (LIPITOR) 20 MG tablet TAKE 1 TABLET BY MOUTH EVERY DAY 90 tablet 2   Blood Glucose Calibration (TRUE METRIX LEVEL 1) Low SOLN Use as instructed 1 each 0   Blood Glucose Monitoring Suppl (TRUE METRIX AIR GLUCOSE METER) w/Device KIT 1 Device by Other route as directed. 1 kit 0   Cholecalciferol (VITAMIN D ) 50 MCG (2000 UT) CAPS Take 2,000 Units by mouth daily.     Coenzyme Q10 (CO Q-10) 200  MG CAPS Take 200 mg by mouth daily.     cyanocobalamin  (VITAMIN B12) 1000 MCG tablet Take 1,000 mcg by mouth daily.     glipiZIDE  (GLUCOTROL ) 5 MG tablet TAKE Glipizide  5 mg before dinner. If you have a particularly small dinner do not take the Glipizide . 180 tablet 1   insulin  glargine (LANTUS  SOLOSTAR) 100 UNIT/ML Solostar Pen INJECT 20-24 UNITS INTO THE SKIN AT BEDTIME. 15 mL 5   JARDIANCE  25 MG TABS tablet TAKE 1 TABLET (25 MG TOTAL) BY MOUTH DAILY. 90 tablet 3   lidocaine  4 % Place 1 patch onto the skin daily as needed (pain).     losartan  (COZAAR ) 50 MG tablet TAKE 1 TABLET BY MOUTH DAILY  90 tablet 3   Magnesium 250 MG TABS Take 250 mg by mouth daily.     metFORMIN  (GLUCOPHAGE ) 500 MG tablet TAKE 2 TABLETS BY MOUTH TWICE DAILY WITH MEALS 360 tablet 3   minoxidil  (ROGAINE ) 2 % external solution Apply 1 application topically daily. 60 mL 10   Misc Natural Products (JOINT HEALTH PO) Take 1 tablet by mouth 2 (two) times daily. Osteo Bi-Flex     MYRBETRIQ 50 MG TB24 tablet Take 50 mg by mouth daily.     OXYGEN  Inhale 2-2.5 L into the lungs at bedtime.     Semaglutide , 2 MG/DOSE, (OZEMPIC , 2 MG/DOSE,) 8 MG/3ML SOPN INJECT 2 MG AS DIRECTED ONCE A WEEK. 9 mL 3   tamsulosin (FLOMAX) 0.4 MG CAPS capsule Take 0.4 mg by mouth at bedtime.     TRUE METRIX BLOOD GLUCOSE TEST test strip TEST BLOOD SUGAR TWICE DAILY 200 strip 0   TRUEplus Lancets 33G MISC Use as instructed two times daily 300 each 2   No current facility-administered medications for this visit.    Allergies  Allergen Reactions   Ace Inhibitors Cough    Social History   Socioeconomic History   Marital status: Married    Spouse name: Architectural Technologist   Number of children: 2   Years of education: 16   Highest education level: Master's degree (e.g., MA, MS, MEng, MEd, MSW, MBA)  Occupational History   Occupation: Research Scientist (medical)-- self-employeed/[employer]   Occupation: retired  Tobacco Use   Smoking status: Never    Passive exposure: Past   Smokeless tobacco: Never  Vaping Use   Vaping status: Never Used  Substance and Sexual Activity   Alcohol use: No   Drug use: No   Sexual activity: Yes    Partners: Female  Other Topics Concern   Not on file  Social History Narrative   Not on file   Social Drivers of Health   Financial Resource Strain: Low Risk  (03/27/2024)   Overall Financial Resource Strain (CARDIA)    Difficulty of Paying Living Expenses: Not hard at all  Food Insecurity: No Food Insecurity (03/27/2024)   Hunger Vital Sign    Worried About Running Out of Food in the Last Year: Never true    Ran Out  of Food in the Last Year: Never true  Transportation Needs: No Transportation Needs (03/27/2024)   PRAPARE - Administrator, Civil Service (Medical): No    Lack of Transportation (Non-Medical): No  Physical Activity: Sufficiently Active (03/27/2024)   Exercise Vital Sign    Days of Exercise per Week: 1 day    Minutes of Exercise per Session: 150+ min  Stress: Stress Concern Present (03/27/2024)   [employer] of Occupational Health - Occupational Stress Questionnaire  Feeling of Stress: To some extent  Social Connections: Moderately Isolated (03/27/2024)   Social Connection and Isolation Panel    Frequency of Communication with Friends and Family: More than three times a week    Frequency of Social Gatherings with Friends and Family: More than three times a week    Attends Religious Services: Never    Database Administrator or Organizations: No    Attends Engineer, Structural: Not on file    Marital Status: Married  Catering Manager Violence: Not At Risk (03/27/2024)   Humiliation, Afraid, Rape, and Kick questionnaire    Fear of Current or Ex-Partner: No    Emotionally Abused: No    Physically Abused: No    Sexually Abused: No    Family History  Problem Relation Age of Onset   Hyperlipidemia Mother    Hypertension Mother    Arthritis Mother    Cancer Mother    Heart disease Mother    COPD Mother    Stroke Mother    Hyperlipidemia Father    Hypertension Father    Heart attack Father        Extenuating circumstances   Heart disease Father    Hyperlipidemia Brother    Diabetes Maternal Grandmother    Diabetes Maternal Grandfather    Diabetes Paternal Grandmother    Colon cancer Neg Hx    Stomach cancer Neg Hx    Esophageal cancer Neg Hx    Rectal cancer Neg Hx    Colon polyps Neg Hx     Review of Systems:  As stated in the HPI and otherwise negative.   There were no vitals taken for this visit.  Physical Examination: General: Well  developed, well nourished, NAD  HEENT: OP clear, mucus membranes moist  SKIN: warm, dry. No rashes. Neuro: No focal deficits  Musculoskeletal: Muscle strength 5/5 all ext  Psychiatric: Mood and affect normal  Neck: No JVD, no carotid bruits, no thyromegaly, no lymphadenopathy.  Lungs:Clear bilaterally, no wheezes, rhonci, crackles Cardiovascular: Regular rate and rhythm. No murmurs, gallops or rubs. Abdomen:Soft. Bowel sounds present. Non-tender.  Extremities: No lower extremity edema. Pulses are 2 + in the bilateral DP/PT.  EKG:  EKG is *** ordered today. The ekg ordered today demonstrates   Recent Labs: 12/06/2023: ALT 14; BUN 16; Creatinine, Ser 0.65; Hemoglobin 14.4; Platelets 316.0; Potassium 4.3; Pro B Natriuretic peptide (BNP) 16.0; Sodium 139; TSH 0.63   Lipid Panel    Component Value Date/Time   CHOL 113 12/06/2023 1026   TRIG 97.0 12/06/2023 1026   TRIG 150 (H) 07/13/2006 1030   HDL 47.10 12/06/2023 1026   CHOLHDL 2 12/06/2023 1026   VLDL 19.4 12/06/2023 1026   LDLCALC 47 12/06/2023 1026   LDLCALC 44 07/12/2023 1116   LDLDIRECT 72.0 05/07/2018 1225     Wt Readings from Last 3 Encounters:  05/29/24 213 lb (96.6 kg)  05/13/24 209 lb 3.2 oz (94.9 kg)  03/27/24 210 lb (95.3 kg)    Assessment and Plan:   1. Aortic insufficiency: Mild by echo in 2024. Repeat echo in August 2026.    2. HTN: BP is well controlled. Continue current therapy  Labs/ tests ordered today include:   No orders of the defined types were placed in this encounter.  Disposition:   F/U with me in one year   Signed, Lonni Cash, MD, [facility] 07/14/2024 1:15 PM    [facility] [address] [place], [place], [place]  [address]  Phone: [phone]; Fax: [phone]

## 2024-07-15 ENCOUNTER — Ambulatory Visit: Attending: Cardiovascular Disease | Admitting: Cardiovascular Disease

## 2024-07-15 ENCOUNTER — Other Ambulatory Visit (HOSPITAL_COMMUNITY): Payer: Self-pay

## 2024-07-15 ENCOUNTER — Encounter: Payer: Self-pay | Admitting: Cardiovascular Disease

## 2024-07-15 VITALS — BP 138/68 | HR 64 | Ht 70.0 in | Wt 207.0 lb

## 2024-07-15 DIAGNOSIS — R0609 Other forms of dyspnea: Secondary | ICD-10-CM | POA: Diagnosis not present

## 2024-07-15 DIAGNOSIS — I351 Nonrheumatic aortic (valve) insufficiency: Secondary | ICD-10-CM

## 2024-07-15 DIAGNOSIS — R072 Precordial pain: Secondary | ICD-10-CM | POA: Diagnosis not present

## 2024-07-15 DIAGNOSIS — R079 Chest pain, unspecified: Secondary | ICD-10-CM

## 2024-07-15 DIAGNOSIS — I10 Essential (primary) hypertension: Secondary | ICD-10-CM

## 2024-07-15 MED ORDER — METOPROLOL TARTRATE 50 MG PO TABS
50.0000 mg | ORAL_TABLET | Freq: Once | ORAL | 0 refills | Status: AC
  Filled 2024-07-15: qty 1, 1d supply, fill #0

## 2024-07-15 NOTE — Patient Instructions (Addendum)
 Medication Instructions:  Your physician recommends that you continue on your current medications as directed. Please refer to the Current Medication list given to you today.  *If you need a refill on your cardiac medications before your next appointment, please call your pharmacy*  Lab Work: Have lab work drawn in the lab on the first floor today--BMP If you have labs (blood work) drawn today and your tests are completely normal, you will receive your results only by: MyChart Message (if you have MyChart) OR A paper copy in the mail If you have any lab test that is abnormal or we need to change your treatment, we will call you to review the results.  Testing/Procedures: Your physician has requested that you have cardiac CT. Cardiac computed tomography (CT) is a painless test that uses an x-ray machine to take clear, detailed pictures of your heart. For further information please visit [email or web address] Please follow instruction sheet as given.    Follow-Up: At [facility], you and your health needs are our priority.  As part of our continuing mission to provide you with exceptional heart care, our providers are all part of one team.  This team includes your primary Cardiologist (physician) and Advanced Practice Providers or APPs (Physician Assistants and Nurse Practitioners) who all work together to provide you with the care you need, when you need it.  Your next appointment:   12 month(s)  Provider:   Lonni Cash, MD    We recommend signing up for the patient portal called MyChart.  Sign up information is provided on this After Visit Summary.  MyChart is used to connect with patients for Virtual Visits (Telemedicine).  Patients are able to view lab/test results, encounter notes, upcoming appointments, etc.  Non-urgent messages can be sent to your provider as well.   To learn more about what you can do with MyChart, go to [email or web address].   Other  Instructions     Your cardiac CT will be scheduled at one of the below locations:   [facility] [address] [place], [place] [address] [phone] (Severe contrast allergies only)  OR   [facility] [address] [place], [place] [address] [phone]  OR   [facility] [address] [place], [place] [address] [phone]  OR   [facility] V[facility] Tower [address]  [place], [place] [address]  OR   [facility] [address] [place], [place] [phone]  If scheduled at [facility], please arrive at the [facility] and Children's Entrance (Entrance C2) of [facility] 30 minutes prior to test start time. You can use the FREE valet parking offered at entrance C (encouraged to control the heart rate for the test)  Proceed to the [facility] Radiology Department (first floor) to check-in and test prep.  All radiology patients and guests should use entrance C2 at [facility], accessed from [facility], even though the hospital's physical address listed is [address].  If scheduled at the Heart and Vascular Tower at [address], please enter the parking lot using the [address] entrance and use the FREE valet service at the patient drop-off area. Enter the building and check-in with registration on the main floor.  If scheduled at [facility], please arrive to the Heart and Vascular Center 15 mins early for check-in and test prep.  There is spacious parking and easy access to the  radiology department from the [facility] Heart and Vascular entrance. Please enter here and check-in with the desk attendant.   If scheduled at [facility], please arrive 30 minutes early for check-in and test prep.  Please follow these instructions carefully (unless otherwise directed):  An IV will be required for this test and  Nitroglycerin will be given.  Hold all erectile dysfunction medications at least 3 days (72 hrs) prior to test. (Ie viagra , cialis, sildenafil , tadalafil, etc)   On the Night Before the Test: Be sure to Drink plenty of water. Do not consume any caffeinated/decaffeinated beverages or chocolate 12 hours prior to your test. Do not take any antihistamines 12 hours prior to your test.  On the Day of the Test: Drink plenty of water until 1 hour prior to the test. Do not eat any food 1 hour prior to test. You may take your regular medications prior to the test.  Take metoprolol  (Lopressor ) two hours prior to test. If you take Furosemide/Hydrochlorothiazide /Spironolactone/Chlorthalidone, please HOLD on the morning of the test. Patients who wear a continuous glucose monitor MUST remove the device prior to scanning.        After the Test: Drink plenty of water. After receiving IV contrast, you may experience a mild flushed feeling. This is normal. On occasion, you may experience a mild rash up to 24 hours after the test. This is not dangerous. If this occurs, you can take Benadryl 25 mg, Zyrtec, Claritin, or Allegra and increase your fluid intake. (Patients taking Tikosyn should avoid Benadryl, and may take Zyrtec, Claritin, or Allegra) If you experience trouble breathing, this can be serious. If it is severe call 911 IMMEDIATELY. If it is mild, please call our office.  We will call to schedule your test 2-4 weeks out understanding that some insurance companies will need an authorization prior to the service being performed.   For more information and frequently asked questions, please visit our website : [email or web address]  For non-scheduling related questions, please contact the cardiac imaging nurse navigator should you have any questions/concerns: Cardiac Imaging Nurse Navigators Direct Office Dial: [phone]   For scheduling needs, including cancellations and  rescheduling, please call Brittany, [phone].

## 2024-07-16 ENCOUNTER — Ambulatory Visit: Payer: Self-pay | Admitting: Cardiovascular Disease

## 2024-07-16 DIAGNOSIS — R0609 Other forms of dyspnea: Secondary | ICD-10-CM

## 2024-07-16 DIAGNOSIS — R079 Chest pain, unspecified: Secondary | ICD-10-CM

## 2024-07-16 LAB — BASIC METABOLIC PANEL WITH GFR
BUN/Creatinine Ratio: 21 (ref 10–24)
BUN: 18 mg/dL (ref 8–27)
CO2: 21 mmol/L (ref 20–29)
Calcium: 10.1 mg/dL (ref 8.6–10.2)
Chloride: 102 mmol/L (ref 96–106)
Creatinine, Ser: 0.84 mg/dL (ref 0.76–1.27)
Glucose: 138 mg/dL — ABNORMAL HIGH (ref 70–99)
Potassium: 4.4 mmol/L (ref 3.5–5.2)
Sodium: 140 mmol/L (ref 134–144)
eGFR: 91 mL/min/1.73 (ref >59)

## 2024-07-22 ENCOUNTER — Encounter (HOSPITAL_COMMUNITY): Payer: Self-pay

## 2024-07-24 ENCOUNTER — Ambulatory Visit (HOSPITAL_COMMUNITY)
Admission: RE | Admit: 2024-07-24 | Discharge: 2024-07-24 | Attending: Cardiovascular Disease | Admitting: Cardiovascular Disease

## 2024-07-24 ENCOUNTER — Telehealth: Payer: Self-pay

## 2024-07-24 DIAGNOSIS — R072 Precordial pain: Secondary | ICD-10-CM | POA: Diagnosis present

## 2024-07-24 DIAGNOSIS — E1165 Type 2 diabetes mellitus with hyperglycemia: Secondary | ICD-10-CM

## 2024-07-24 MED ORDER — IOHEXOL 350 MG/ML SOLN
100.0000 mL | Freq: Once | INTRAVENOUS | Status: AC | PRN
  Administered 2024-07-24: 10:00:00 100 mL via INTRAVENOUS

## 2024-07-24 MED ORDER — TRUE METRIX METER DEVI: 0 refills | Status: AC

## 2024-07-24 MED ORDER — INSULIN PEN NEEDLE 32G X 4 MM MISC: 3 refills | Status: AC

## 2024-07-24 MED ORDER — NITROGLYCERIN 0.4 MG SL SUBL
0.8000 mg | SUBLINGUAL_TABLET | Freq: Once | SUBLINGUAL | Status: AC
  Administered 2024-07-24: 09:00:00 0.8 mg via SUBLINGUAL

## 2024-07-24 NOTE — Telephone Encounter (Signed)
 Patient called stating he needed pen needles and a new glucometer sent to his pharmacy. Request refill sent. Patient made aware.

## 2024-07-25 NOTE — Progress Notes (Signed)
" ° °  07/25/2024  Patient ID: Robert Cordova, male   DOB: Sep 18, 1950, 73 y.o.   MRN: [id number]  Pharmacy Quality Measure Review  This patient is appearing on a report for being at risk of failing the adherence measure for hypertension (ACEi/ARB) medications this calendar year.   Medication: Losartan  Last fill date: 06/09/24 for 90 day supply  Insurance report was not up to date. No action needed at this time.   [name], PharmD Clinical Pharmacist [phone]   "

## 2024-07-28 MED ORDER — ASPIRIN 81 MG PO TBEC: 81.0000 mg | DELAYED_RELEASE_TABLET | Freq: Every day | ORAL | Status: AC

## 2024-07-29 ENCOUNTER — Other Ambulatory Visit: Payer: Self-pay | Admitting: Cardiovascular Disease

## 2024-07-29 ENCOUNTER — Encounter: Payer: Self-pay | Admitting: *Deleted

## 2024-07-29 DIAGNOSIS — R0609 Other forms of dyspnea: Secondary | ICD-10-CM

## 2024-07-29 DIAGNOSIS — R079 Chest pain, unspecified: Secondary | ICD-10-CM

## 2024-08-01 ENCOUNTER — Ambulatory Visit: Payer: Self-pay | Admitting: Cardiovascular Disease

## 2024-08-01 ENCOUNTER — Ambulatory Visit (HOSPITAL_COMMUNITY)
Admission: RE | Admit: 2024-08-01 | Discharge: 2024-08-01 | Disposition: A | Discharge status: Home / Self Care | Source: Ambulatory Visit | Attending: Cardiovascular Disease | Admitting: Cardiovascular Disease

## 2024-08-01 DIAGNOSIS — R0609 Other forms of dyspnea: Secondary | ICD-10-CM | POA: Diagnosis present

## 2024-08-01 DIAGNOSIS — R079 Chest pain, unspecified: Secondary | ICD-10-CM | POA: Diagnosis present

## 2024-08-01 LAB — MYOCARDIAL PERFUSION IMAGING
LV dias vol: 129 mL (ref 62–150)
LV sys vol: 57 mL
Nuc Stress EF: 56 %
Peak HR: 91 {beats}/min
Rest HR: 56 {beats}/min
Rest Nuclear Isotope Dose: 10.8 mCi
SDS: 1
SRS: 0
SSS: 1
ST Depression (mm): 0 mm
Stress Nuclear Isotope Dose: 30.9 mCi
TID: 1.07

## 2024-08-01 MED ORDER — REGADENOSON 0.4 MG/5ML IV SOLN
INTRAVENOUS | Status: AC
  Filled 2024-08-01: qty 5

## 2024-08-01 MED ORDER — REGADENOSON 0.4 MG/5ML IV SOLN
0.4000 mg | Freq: Once | INTRAVENOUS | Status: AC
  Administered 2024-08-01: 0.4 mg via INTRAVENOUS

## 2024-08-01 MED ORDER — TECHNETIUM TC 99M TETROFOSMIN IV KIT
30.9000 | PACK | Freq: Once | INTRAVENOUS | Status: AC | PRN
  Administered 2024-08-01: 30.9 via INTRAVENOUS

## 2024-08-01 MED ORDER — TECHNETIUM TC 99M TETROFOSMIN IV KIT
10.8000 | PACK | Freq: Once | INTRAVENOUS | Status: AC | PRN
  Administered 2024-08-01: 10.8 via INTRAVENOUS

## 2024-08-08 ENCOUNTER — Other Ambulatory Visit (HOSPITAL_BASED_OUTPATIENT_CLINIC_OR_DEPARTMENT_OTHER): Payer: Self-pay

## 2024-08-08 MED ORDER — COMIRNATY 30 MCG/0.3ML IM SUSY
0.3000 mL | PREFILLED_SYRINGE | Freq: Once | INTRAMUSCULAR | 0 refills | Status: AC
  Filled 2024-08-08: qty 0.3, 1d supply, fill #0

## 2024-08-08 MED ORDER — FLUZONE HIGH-DOSE 0.5 ML IM SUSY
0.5000 mL | PREFILLED_SYRINGE | Freq: Once | INTRAMUSCULAR | 0 refills | Status: AC
  Filled 2024-08-08: qty 0.5, 1d supply, fill #0

## 2024-08-08 NOTE — Progress Notes (Signed)
 Robert Cordova                                          MRN: [id number]   08/08/2024   The VBCI Quality Team Specialist reviewed this patient medical record for the purposes of chart review for care gap closure. The following were reviewed: abstraction for care gap closure-controlling blood pressure.    VBCI Quality Team

## 2024-08-11 ENCOUNTER — Ambulatory Visit: Payer: Self-pay | Admitting: Cardiovascular Disease

## 2024-08-19 ENCOUNTER — Ambulatory Visit: Admitting: Family Medicine

## 2024-08-19 ENCOUNTER — Encounter: Payer: Self-pay | Admitting: Family Medicine

## 2024-08-19 ENCOUNTER — Ambulatory Visit: Payer: Self-pay

## 2024-08-19 ENCOUNTER — Ambulatory Visit: Payer: Self-pay | Admitting: Family Medicine

## 2024-08-19 VITALS — BP 138/82 | HR 75 | Temp 98.2°F | Resp 16 | Ht 70.0 in | Wt 213.8 lb

## 2024-08-19 DIAGNOSIS — M549 Dorsalgia, unspecified: Secondary | ICD-10-CM

## 2024-08-19 DIAGNOSIS — R10A2 Flank pain, left side: Secondary | ICD-10-CM

## 2024-08-19 LAB — URINALYSIS, ROUTINE W REFLEX MICROSCOPIC
Bilirubin Urine: NEGATIVE
Hgb urine dipstick: NEGATIVE
Leukocytes,Ua: NEGATIVE
Nitrite: NEGATIVE
Specific Gravity, Urine: 1.025 (ref 1.000–1.030)
Total Protein, Urine: NEGATIVE
Urine Glucose: >=1000 — AB
Urobilinogen, UA: 0.2 (ref 0.0–1.0)
pH: 5.5 (ref 5.0–8.0)

## 2024-08-19 MED ORDER — CELECOXIB 100 MG PO CAPS: 100.0000 mg | ORAL_CAPSULE | Freq: Two times a day (BID) | ORAL | 0 refills | Status: AC

## 2024-08-19 NOTE — Progress Notes (Signed)
 "  ACUTE VISIT Chief Complaint  Patient presents with   Flank Pain    Left flank pain x 1 day - kidney stones in the past   Discussed the use of AI scribe software for clinical note transcription with the patient, who gave verbal consent to proceed. History of Present Illness [place] Bills Robert Cordova is a 74 year old male with past medical history significant for DM 2, hypertension, multiple thyroid  nodules, OSA, hyperlipidemia, BPH with nocturia, and chronic neck pain who presents with left flank pain.  He has experienced left flank pain, noted when he got up this morning, describing it as a dull, ache with a severity of 6 to 7 out of 10. The pain is located in the posterior ribcage area and extends around the waist. It worsens with movement and improves when sitting still or lying down. He has not taken any medication for the pain but has applied heat for relief.  He has a history of kidney stones, with the last episode occurring five to ten years ago. He recalls that the current pain is similar to previous episodes of kidney stones. No history of trauma or unusual physical activity that could have contributed to the pain.  He reports his blood sugars are normal for him. No hx of CKD or CAD. He mentions hx of shortness of breath, for which he consulted a pulmonologist and cardiologist.  No changes in bowel habits, nausea, vomiting, or abdominal pain. Denies changes in urinary frequency or gross hematuria. No fever, chills, changes in appetite, rash, numbness/tingling on affected area.   Lab Results  Component Value Date   NA 140 07/15/2024   CL 102 07/15/2024   K 4.4 07/15/2024   CO2 21 07/15/2024   BUN 18 07/15/2024   CREATININE 0.84 07/15/2024   EGFR 91 07/15/2024   CALCIUM  10.1 07/15/2024   ALBUMIN 4.5 12/06/2023   GLUCOSE 138 (H) 07/15/2024   Review of Systems  Constitutional:  Positive for activity change. Negative for appetite change and unexpected weight change.   HENT:  Negative for sore throat.   Respiratory:  Negative for cough.   Cardiovascular:  Negative for chest pain and leg swelling.  Gastrointestinal:  Negative for blood in stool.  Genitourinary:  Negative for dysuria and testicular pain.  Neurological:  Negative for syncope and weakness.  Psychiatric/Behavioral:  Negative for confusion and hallucinations.   See other pertinent positives and negatives in HPI.  Medications Ordered Prior to Encounter[1]  Past Medical History:  Diagnosis Date   ABNORMAL GLUCOSE NEC 03/18/2007   Allergy 2005   Ace inhibitors--coughing   Anxiety    Arthritis 2025   fingers   BLEPHARITIS, LEFT 10/29/2009   BPH (benign prostatic hyperplasia)    Cancer (HCC) 2024   Prostate 10/2022   Cataract 2020   both removed   COPD (chronic obstructive pulmonary disease) (HCC) 2016   Use oxygen  at night   DIABETES-TYPE 2 04/18/2010   Dry throat    Environmental allergies    GERD (gastroesophageal reflux disease) 2010   H/O hiatal hernia    HYPERLIPIDEMIA 10/22/2007   HYPERTENSION 03/18/2007   Multiple thyroid  nodules    biopsy done, everything was fine being monitored   Neuromuscular disorder (HCC) 2024   Toes on left fiot   NEVUS, MELANOCYTIC, FACE 10/07/2009   Osteoarthritis of both thumbs    Oxygen  deficiency 2016   Uses O2 concentrator at night  shallow breather   Allergies[2]  Social History   Socioeconomic  History   Marital status: Married    Spouse name: Architectural Technologist   Number of children: 2   Years of education: 16   Highest education level: Master's degree (e.g., MA, MS, MEng, MEd, MSW, MBA)  Occupational History   Occupation: Research Scientist (medical)-- self-employeed/[employer]   Occupation: retired  Tobacco Use   Smoking status: Never    Passive exposure: Past   Smokeless tobacco: Never  Vaping Use   Vaping status: Never Used  Substance and Sexual Activity   Alcohol use: No   Drug use: No   Sexual activity: Yes    Partners: Female   Other Topics Concern   Not on file  Social History Narrative   Not on file   Social Drivers of Health   Tobacco Use: Low Risk (08/19/2024)   Patient History    Smoking Tobacco Use: Never    Smokeless Tobacco Use: Never    Passive Exposure: Past  Financial Resource Strain: Low Risk (08/19/2024)   Overall Financial Resource Strain (CARDIA)    Difficulty of Paying Living Expenses: Not hard at all  Food Insecurity: No Food Insecurity (08/19/2024)   Epic    Worried About Programme Researcher, Broadcasting/film/video in the Last Year: Never true    Ran Out of Food in the Last Year: Never true  Transportation Needs: No Transportation Needs (08/19/2024)   Epic    Lack of Transportation (Medical): No    Lack of Transportation (Non-Medical): No  Physical Activity: Insufficiently Active (08/19/2024)   Exercise Vital Sign    Days of Exercise per Week: 1 day    Minutes of Exercise per Session: 120 min  Stress: Stress Concern Present (08/19/2024)   [employer] of Occupational Health - Occupational Stress Questionnaire    Feeling of Stress: Rather much  Social Connections: Moderately Isolated (08/19/2024)   Social Connection and Isolation Panel    Frequency of Communication with Friends and Family: More than three times a week    Frequency of Social Gatherings with Friends and Family: Once a week    Attends Religious Services: Patient declined    Active Member of Clubs or Organizations: No    Attends Engineer, Structural: Not on file    Marital Status: Married  Depression (PHQ2-9): Medium Risk (03/27/2024)   Depression (PHQ2-9)    PHQ-2 Score: 7  Alcohol Screen: Low Risk (08/19/2024)   Alcohol Screen    Last Alcohol Screening Score (AUDIT): 1  Housing: Low Risk (08/19/2024)   Epic    Unable to Pay for Housing in the Last Year: No    Number of Times Moved in the Last Year: 0    Homeless in the Last Year: No  Utilities: Not At Risk (03/27/2024)   Epic    Threatened with loss of utilities: No  Health  Literacy: Inadequate Health Literacy (03/27/2024)   B1300 Health Literacy    Frequency of need for help with medical instructions: Sometimes   Vitals:   08/19/24 1103  BP: 138/82  Pulse: 75  Resp: 16  Temp: 98.2 F (36.8 C)  SpO2: 96%   Body mass index is 30.68 kg/m.  Physical Exam Vitals and nursing note reviewed.  Constitutional:      General: He is not in acute distress.    Appearance: He is well-developed.  HENT:     Head: Normocephalic and atraumatic.  Eyes:     Conjunctiva/sclera: Conjunctivae normal.  Cardiovascular:     Rate and Rhythm: Normal rate and regular rhythm.  Pulmonary:     Effort: Pulmonary effort is normal. No respiratory distress.     Breath sounds: Normal breath sounds.  Abdominal:     Palpations: Abdomen is soft. There is no mass.     Tenderness: There is no abdominal tenderness. There is no right CVA tenderness or left CVA tenderness.  Musculoskeletal:       Back:  Skin:    General: Skin is warm.     Findings: No erythema.  Neurological:     General: No focal deficit present.     Mental Status: He is alert and oriented to person, place, and time.     Comments: Mildly antalgic gait, not assisted.  Psychiatric:        Mood and Affect: Mood and affect normal.    ASSESSMENT AND PLAN:  [name] was seen today for flank pain.  Diagnoses and all orders for this visit:  Orders Placed This Encounter  Procedures   Urinalysis, Routine w reflex microscopic   Left flank pain We discussed differential diagnosis, acute episode of kidney stone is in the differential but history is not very suggestive of this. I do not think imaging is needed at this time. Monitor for new symptoms. Increase water intake. UA with reflex microscopic ordered. He follows with urology regularly for BPH with nocturia. He was clearly instructed about warning signs.  -     Urinalysis, Routine w reflex microscopic; Future  Musculoskeletal back  pain Above problem could be musculoskeletal. Pain is elicited by palpation and with movement. Recommend Celebrex  100 mg twice daily for 7 to 10 days, we discussed son side effects. Topical IcyHot or Aspercreme on affected area may help, he also has lidocaine  patches at home. Follow-up as needed.  -     Celecoxib ; Take 1 capsule (100 mg total) by mouth 2 (two) times daily for 10 days.  Dispense: 20 capsule; Refill: 0  Return if symptoms worsen or fail to improve.  Amely Voorheis G. Zamiah Tollett, MD  [facility]. [place] office.     [1]  Current Outpatient Medications on File Prior to Visit  Medication Sig Dispense Refill   aspirin  EC 81 MG tablet Take 1 tablet (81 mg total) by mouth daily. Swallow whole.     atorvastatin  (LIPITOR) 20 MG tablet TAKE 1 TABLET BY MOUTH EVERY DAY 90 tablet 2   Blood Glucose Calibration (TRUE METRIX LEVEL 1) Low SOLN Use as instructed 1 each 0   Blood Glucose Monitoring Suppl (TRUE METRIX AIR GLUCOSE METER) w/Device KIT 1 Device by Other route as directed. 1 kit 0   Blood Glucose Monitoring Suppl (TRUE METRIX METER) DEVI Use to check blood glucose levels at different times of the day daily 1 each 0   Cholecalciferol (VITAMIN D ) 50 MCG (2000 UT) CAPS Take 2,000 Units by mouth daily.     Coenzyme Q10 (CO Q-10) 200 MG CAPS Take 200 mg by mouth daily.     cyanocobalamin  (VITAMIN B12) 1000 MCG tablet Take 1,000 mcg by mouth daily.     glipiZIDE  (GLUCOTROL ) 5 MG tablet TAKE Glipizide  5 mg before dinner. If you have a particularly small dinner do not take the Glipizide . 180 tablet 1   insulin  glargine (LANTUS  SOLOSTAR) 100 UNIT/ML Solostar Pen INJECT 20-24 UNITS INTO THE SKIN AT BEDTIME. 15 mL 5   Insulin  Pen Needle 32G X 4 MM MISC Use with insulin  pen to inject into the skin daily 300 each 3   JARDIANCE  25 MG  TABS tablet TAKE 1 TABLET (25 MG TOTAL) BY MOUTH DAILY. 90 tablet 3   lidocaine  4 % Place 1 patch onto the skin daily as needed (pain).     losartan  (COZAAR )  50 MG tablet TAKE 1 TABLET BY MOUTH DAILY 90 tablet 3   Magnesium 250 MG TABS Take 250 mg by mouth daily.     metFORMIN  (GLUCOPHAGE ) 500 MG tablet TAKE 2 TABLETS BY MOUTH TWICE DAILY WITH MEALS 360 tablet 3   metoprolol  tartrate (LOPRESSOR ) 50 MG tablet Take 1 tablet (50 mg total) by mouth once 2 hours prior to CT scan. 1 tablet 0   minoxidil  (ROGAINE ) 2 % external solution Apply 1 application topically daily. 60 mL 10   Misc Natural Products (JOINT HEALTH PO) Take 1 tablet by mouth 2 (two) times daily. Osteo Bi-Flex     MYRBETRIQ 50 MG TB24 tablet Take 50 mg by mouth daily.     OXYGEN  Inhale 2-2.5 L into the lungs at bedtime.     Semaglutide , 2 MG/DOSE, (OZEMPIC , 2 MG/DOSE,) 8 MG/3ML SOPN INJECT 2 MG AS DIRECTED ONCE A WEEK. 9 mL 3   tamsulosin (FLOMAX) 0.4 MG CAPS capsule Take 0.4 mg by mouth at bedtime.     TRUE METRIX BLOOD GLUCOSE TEST test strip TEST BLOOD SUGAR TWICE DAILY 200 strip 0   TRUEplus Lancets 33G MISC Use as instructed two times daily 300 each 2   No current facility-administered medications on file prior to visit.  [2]  Allergies Allergen Reactions   Ace Inhibitors Cough   "

## 2024-08-19 NOTE — Patient Instructions (Signed)
 A few things to remember from today's visit:  Left flank pain - Plan: Urinalysis, Routine w reflex microscopic  Musculoskeletal back pain - Plan: celecoxib  (CELEBREX ) 100 MG capsule Could be muscular pain. Increase fluid intake. Monitor for new symptoms, including skin rash. Recommend Celebrex  100 mg twice daily for 7 to 10 days. Topical IcyHot or Aspercreme may also help. Follow-up with PCP if the problem is persisting.  If you need refills for medications you take chronically, please call your pharmacy. Do not use My Chart to request refills or for acute issues that need immediate attention. If you send a my chart message, it may take a few days to be addressed, specially if I am not in the office.  Please be sure medication list is accurate. If a new problem present, please set up appointment sooner than planned today.

## 2024-08-19 NOTE — Telephone Encounter (Signed)
 FYI Only or Action Required?: FYI only for provider: appointment scheduled on 08/19/24.  Patient was last seen in primary care on 03/27/2024 by Luke Chiquita SAUNDERS, DO.  Called Nurse Triage reporting Flank Pain.  Symptoms began today.  Interventions attempted: Nothing.  Symptoms are: worse with movement.  Triage Disposition: See Physician Within 24 Hours  Patient/caregiver understands and will follow disposition?: Yes                    Copied from CRM #[id number]. Topic: Clinical - Red Word Triage >> Aug 19, 2024  8:34 AM Pinkey ORN wrote: Red Word that prompted transfer to Nurse Triage: Severe Back Pain (Left Side) Reason for Disposition  MODERATE pain (e.g., interferes with normal activities or awakens from sleep)  Answer Assessment - Initial Assessment Questions 1. LOCATION: Where does it hurt? (e.g., left, right)     Left.  2. ONSET: When did the pain start?     Woke up with pain around 0530 this morning.  3. SEVERITY: How bad is the pain? (e.g., Scale 1-10; mild, moderate, or severe)     6-7/10 4. PATTERN: Does the pain come and go, or is it constant?      Constant.  5. CAUSE: What do you think is causing the pain?     History of kidney stones, thinks this could be a kidney stone.  6. OTHER SYMPTOMS:  Do you have any other symptoms? (e.g., fever, abdomen pain, vomiting, leg weakness, burning with urination, blood in urine)     No abdominal pain, urinary retention, pain or burning with urination, vomiting, weakness of leg or foot, fever  Protocols used: Flank Pain-A-AH

## 2024-09-12 ENCOUNTER — Encounter: Payer: Self-pay | Admitting: Family Medicine

## 2024-09-15 ENCOUNTER — Ambulatory Visit: Admitting: Internal Medicine

## 2024-09-17 ENCOUNTER — Ambulatory Visit: Admitting: Internal Medicine

## 2024-11-11 ENCOUNTER — Encounter
